# Patient Record
Sex: Female | Born: 1960 | Race: Black or African American | Hispanic: No | Marital: Single | Smoking: Current every day smoker
Health system: Southern US, Community
[De-identification: ages and names within clinical notes are randomized; demographics above are authoritative.]

## PROBLEM LIST (undated history)

## (undated) DIAGNOSIS — K219 Gastro-esophageal reflux disease without esophagitis: Secondary | ICD-10-CM

## (undated) DIAGNOSIS — F329 Major depressive disorder, single episode, unspecified: Secondary | ICD-10-CM

## (undated) DIAGNOSIS — M1990 Unspecified osteoarthritis, unspecified site: Secondary | ICD-10-CM

## (undated) DIAGNOSIS — J181 Lobar pneumonia, unspecified organism: Principal | ICD-10-CM

## (undated) DIAGNOSIS — R55 Syncope and collapse: Secondary | ICD-10-CM

## (undated) DIAGNOSIS — F32A Depression, unspecified: Secondary | ICD-10-CM

## (undated) DIAGNOSIS — F3181 Bipolar II disorder: Secondary | ICD-10-CM

## (undated) HISTORY — DX: Unspecified osteoarthritis, unspecified site: M19.90

## (undated) HISTORY — DX: Bipolar II disorder: F31.81

## (undated) HISTORY — DX: Lobar pneumonia, unspecified organism: J18.1

## (undated) HISTORY — PX: EYE SURGERY: SHX253

## (undated) HISTORY — PX: OTHER SURGICAL HISTORY: SHX169

## (undated) HISTORY — DX: Morbid (severe) obesity due to excess calories: E66.01

## (undated) HISTORY — PX: ROTATOR CUFF REPAIR: SHX139

## (undated) HISTORY — DX: Depression, unspecified: F32.A

## (undated) HISTORY — DX: Major depressive disorder, single episode, unspecified: F32.9

---

## 1997-11-25 ENCOUNTER — Ambulatory Visit (HOSPITAL_COMMUNITY): Admission: RE | Admit: 1997-11-25 | Discharge: 1997-11-25 | Payer: Self-pay | Admitting: Obstetrics

## 1997-12-09 ENCOUNTER — Ambulatory Visit (HOSPITAL_COMMUNITY): Admission: RE | Admit: 1997-12-09 | Discharge: 1997-12-09 | Payer: Self-pay | Admitting: Obstetrics

## 1998-01-20 ENCOUNTER — Other Ambulatory Visit: Admission: RE | Admit: 1998-01-20 | Discharge: 1998-01-20 | Payer: Self-pay | Admitting: Obstetrics

## 1998-02-19 ENCOUNTER — Inpatient Hospital Stay (HOSPITAL_COMMUNITY): Admission: AD | Admit: 1998-02-19 | Discharge: 1998-02-19 | Payer: Self-pay | Admitting: Obstetrics

## 1998-02-19 ENCOUNTER — Inpatient Hospital Stay (HOSPITAL_COMMUNITY): Admission: AD | Admit: 1998-02-19 | Discharge: 1998-02-22 | Payer: Self-pay | Admitting: Obstetrics

## 1998-08-18 ENCOUNTER — Emergency Department (HOSPITAL_COMMUNITY): Admission: EM | Admit: 1998-08-18 | Discharge: 1998-08-18 | Payer: Self-pay | Admitting: Emergency Medicine

## 1998-08-19 ENCOUNTER — Encounter: Payer: Self-pay | Admitting: Infectious Diseases

## 1998-08-19 ENCOUNTER — Ambulatory Visit (HOSPITAL_COMMUNITY): Admission: RE | Admit: 1998-08-19 | Discharge: 1998-08-19 | Payer: Self-pay | Admitting: *Deleted

## 1998-08-21 ENCOUNTER — Emergency Department (HOSPITAL_COMMUNITY): Admission: EM | Admit: 1998-08-21 | Discharge: 1998-08-21 | Payer: Self-pay | Admitting: Emergency Medicine

## 2000-02-08 ENCOUNTER — Encounter: Admission: RE | Admit: 2000-02-08 | Discharge: 2000-05-08 | Payer: Self-pay | Admitting: *Deleted

## 2000-02-09 ENCOUNTER — Emergency Department (HOSPITAL_COMMUNITY): Admission: EM | Admit: 2000-02-09 | Discharge: 2000-02-09 | Payer: Self-pay | Admitting: Emergency Medicine

## 2000-02-11 ENCOUNTER — Encounter: Admission: RE | Admit: 2000-02-11 | Discharge: 2000-02-11 | Payer: Self-pay | Admitting: Occupational Medicine

## 2000-02-11 ENCOUNTER — Encounter: Payer: Self-pay | Admitting: Occupational Medicine

## 2000-03-24 ENCOUNTER — Encounter: Payer: Self-pay | Admitting: Orthopedic Surgery

## 2000-03-24 ENCOUNTER — Encounter: Admission: RE | Admit: 2000-03-24 | Discharge: 2000-03-24 | Payer: Self-pay | Admitting: Orthopedic Surgery

## 2000-08-04 ENCOUNTER — Encounter: Admission: RE | Admit: 2000-08-04 | Discharge: 2000-08-04 | Payer: Self-pay | Admitting: Orthopedic Surgery

## 2000-08-04 ENCOUNTER — Encounter: Payer: Self-pay | Admitting: Orthopedic Surgery

## 2000-09-30 ENCOUNTER — Emergency Department (HOSPITAL_COMMUNITY): Admission: EM | Admit: 2000-09-30 | Discharge: 2000-09-30 | Payer: Self-pay | Admitting: *Deleted

## 2000-10-12 ENCOUNTER — Encounter: Admission: RE | Admit: 2000-10-12 | Discharge: 2000-10-12 | Payer: Self-pay | Admitting: Obstetrics

## 2000-10-16 ENCOUNTER — Ambulatory Visit (HOSPITAL_COMMUNITY): Admission: RE | Admit: 2000-10-16 | Discharge: 2000-10-16 | Payer: Self-pay | Admitting: Obstetrics

## 2000-10-31 ENCOUNTER — Emergency Department (HOSPITAL_COMMUNITY): Admission: EM | Admit: 2000-10-31 | Discharge: 2000-10-31 | Payer: Self-pay | Admitting: Emergency Medicine

## 2000-11-02 ENCOUNTER — Encounter: Admission: RE | Admit: 2000-11-02 | Discharge: 2000-11-02 | Payer: Self-pay | Admitting: Obstetrics

## 2001-02-02 ENCOUNTER — Emergency Department (HOSPITAL_COMMUNITY): Admission: EM | Admit: 2001-02-02 | Discharge: 2001-02-02 | Payer: Self-pay | Admitting: Emergency Medicine

## 2001-02-15 ENCOUNTER — Encounter: Admission: RE | Admit: 2001-02-15 | Discharge: 2001-02-15 | Payer: Self-pay | Admitting: Obstetrics

## 2001-02-27 ENCOUNTER — Emergency Department (HOSPITAL_COMMUNITY): Admission: EM | Admit: 2001-02-27 | Discharge: 2001-02-27 | Payer: Self-pay | Admitting: Emergency Medicine

## 2001-03-02 ENCOUNTER — Encounter: Admission: RE | Admit: 2001-03-02 | Discharge: 2001-03-02 | Payer: Self-pay | Admitting: Family Medicine

## 2001-03-08 ENCOUNTER — Encounter: Admission: RE | Admit: 2001-03-08 | Discharge: 2001-05-07 | Payer: Self-pay | Admitting: Sports Medicine

## 2001-06-28 ENCOUNTER — Encounter: Admission: RE | Admit: 2001-06-28 | Discharge: 2001-09-26 | Payer: Self-pay | Admitting: Orthopedic Surgery

## 2001-09-04 ENCOUNTER — Encounter: Admission: RE | Admit: 2001-09-04 | Discharge: 2001-09-04 | Payer: Self-pay | Admitting: Obstetrics & Gynecology

## 2001-10-28 ENCOUNTER — Emergency Department (HOSPITAL_COMMUNITY): Admission: EM | Admit: 2001-10-28 | Discharge: 2001-10-28 | Payer: Self-pay | Admitting: Emergency Medicine

## 2002-03-13 ENCOUNTER — Emergency Department (HOSPITAL_COMMUNITY): Admission: EM | Admit: 2002-03-13 | Discharge: 2002-03-14 | Payer: Self-pay | Admitting: *Deleted

## 2002-03-20 ENCOUNTER — Encounter: Admission: RE | Admit: 2002-03-20 | Discharge: 2002-03-20 | Payer: Self-pay | Admitting: Sports Medicine

## 2002-03-20 ENCOUNTER — Encounter: Payer: Self-pay | Admitting: Sports Medicine

## 2002-04-01 ENCOUNTER — Encounter: Admission: RE | Admit: 2002-04-01 | Discharge: 2002-04-01 | Payer: Self-pay | Admitting: Family Medicine

## 2002-04-29 ENCOUNTER — Encounter: Admission: RE | Admit: 2002-04-29 | Discharge: 2002-04-29 | Payer: Self-pay | Admitting: Family Medicine

## 2002-05-27 ENCOUNTER — Encounter: Admission: RE | Admit: 2002-05-27 | Discharge: 2002-05-27 | Payer: Self-pay | Admitting: Family Medicine

## 2002-06-06 ENCOUNTER — Encounter: Admission: RE | Admit: 2002-06-06 | Discharge: 2002-06-06 | Payer: Self-pay | Admitting: Sports Medicine

## 2002-06-06 ENCOUNTER — Encounter: Payer: Self-pay | Admitting: Sports Medicine

## 2002-06-11 ENCOUNTER — Encounter: Admission: RE | Admit: 2002-06-11 | Discharge: 2002-06-11 | Payer: Self-pay | Admitting: Family Medicine

## 2002-07-10 ENCOUNTER — Encounter: Admission: RE | Admit: 2002-07-10 | Discharge: 2002-07-10 | Payer: Self-pay | Admitting: Family Medicine

## 2002-08-08 ENCOUNTER — Encounter: Admission: RE | Admit: 2002-08-08 | Discharge: 2002-08-08 | Payer: Self-pay | Admitting: Family Medicine

## 2002-10-15 ENCOUNTER — Encounter: Admission: RE | Admit: 2002-10-15 | Discharge: 2002-11-01 | Payer: Self-pay | Admitting: Family Medicine

## 2002-10-31 ENCOUNTER — Encounter: Admission: RE | Admit: 2002-10-31 | Discharge: 2002-10-31 | Payer: Self-pay | Admitting: Family Medicine

## 2002-11-04 ENCOUNTER — Encounter: Admission: RE | Admit: 2002-11-04 | Discharge: 2002-11-04 | Payer: Self-pay | Admitting: Family Medicine

## 2002-11-07 ENCOUNTER — Encounter: Admission: RE | Admit: 2002-11-07 | Discharge: 2002-11-07 | Payer: Self-pay | Admitting: Family Medicine

## 2002-12-31 ENCOUNTER — Ambulatory Visit (HOSPITAL_BASED_OUTPATIENT_CLINIC_OR_DEPARTMENT_OTHER): Admission: RE | Admit: 2002-12-31 | Discharge: 2002-12-31 | Payer: Self-pay | Admitting: Orthopaedic Surgery

## 2003-01-28 ENCOUNTER — Encounter: Admission: RE | Admit: 2003-01-28 | Discharge: 2003-04-15 | Payer: Self-pay | Admitting: Orthopaedic Surgery

## 2004-03-15 ENCOUNTER — Emergency Department (HOSPITAL_COMMUNITY): Admission: EM | Admit: 2004-03-15 | Discharge: 2004-03-15 | Payer: Self-pay | Admitting: Emergency Medicine

## 2004-07-31 ENCOUNTER — Emergency Department (HOSPITAL_COMMUNITY): Admission: EM | Admit: 2004-07-31 | Discharge: 2004-07-31 | Payer: Self-pay | Admitting: Family Medicine

## 2004-08-18 ENCOUNTER — Ambulatory Visit: Payer: Self-pay | Admitting: Family Medicine

## 2004-09-10 ENCOUNTER — Ambulatory Visit: Payer: Self-pay | Admitting: Family Medicine

## 2004-10-03 ENCOUNTER — Encounter (INDEPENDENT_AMBULATORY_CARE_PROVIDER_SITE_OTHER): Payer: Self-pay | Admitting: *Deleted

## 2004-10-03 LAB — CONVERTED CEMR LAB

## 2004-10-19 ENCOUNTER — Ambulatory Visit: Payer: Self-pay | Admitting: Family Medicine

## 2004-11-17 ENCOUNTER — Ambulatory Visit: Payer: Self-pay | Admitting: Sports Medicine

## 2004-12-14 ENCOUNTER — Ambulatory Visit: Payer: Self-pay | Admitting: Family Medicine

## 2004-12-22 ENCOUNTER — Encounter
Admission: RE | Admit: 2004-12-22 | Discharge: 2005-03-22 | Payer: Self-pay | Admitting: Physical Medicine and Rehabilitation

## 2004-12-24 ENCOUNTER — Ambulatory Visit: Payer: Self-pay | Admitting: Physical Medicine and Rehabilitation

## 2004-12-28 ENCOUNTER — Ambulatory Visit: Payer: Self-pay | Admitting: Family Medicine

## 2005-01-13 ENCOUNTER — Ambulatory Visit: Payer: Self-pay | Admitting: Family Medicine

## 2005-02-24 ENCOUNTER — Encounter: Admission: RE | Admit: 2005-02-24 | Discharge: 2005-02-24 | Payer: Self-pay | Admitting: Sports Medicine

## 2005-04-01 ENCOUNTER — Encounter
Admission: RE | Admit: 2005-04-01 | Discharge: 2005-06-30 | Payer: Self-pay | Admitting: Physical Medicine and Rehabilitation

## 2005-04-13 ENCOUNTER — Ambulatory Visit: Payer: Self-pay | Admitting: Physical Medicine and Rehabilitation

## 2005-04-25 ENCOUNTER — Ambulatory Visit: Payer: Self-pay | Admitting: Psychology

## 2005-04-25 ENCOUNTER — Encounter
Admission: RE | Admit: 2005-04-25 | Discharge: 2005-06-16 | Payer: Self-pay | Admitting: Physical Medicine and Rehabilitation

## 2005-04-29 ENCOUNTER — Ambulatory Visit: Payer: Self-pay | Admitting: Family Medicine

## 2005-05-02 ENCOUNTER — Emergency Department (HOSPITAL_COMMUNITY): Admission: EM | Admit: 2005-05-02 | Discharge: 2005-05-02 | Payer: Self-pay | Admitting: Family Medicine

## 2005-05-12 ENCOUNTER — Emergency Department (HOSPITAL_COMMUNITY): Admission: EM | Admit: 2005-05-12 | Discharge: 2005-05-12 | Payer: Self-pay | Admitting: Emergency Medicine

## 2005-05-17 ENCOUNTER — Ambulatory Visit: Payer: Self-pay | Admitting: Physical Medicine and Rehabilitation

## 2005-05-19 ENCOUNTER — Ambulatory Visit (HOSPITAL_COMMUNITY)
Admission: RE | Admit: 2005-05-19 | Discharge: 2005-05-19 | Payer: Self-pay | Admitting: Physical Medicine and Rehabilitation

## 2005-06-01 ENCOUNTER — Encounter: Admission: RE | Admit: 2005-06-01 | Discharge: 2005-06-01 | Payer: Self-pay | Admitting: Sports Medicine

## 2005-06-14 ENCOUNTER — Ambulatory Visit: Payer: Self-pay | Admitting: Psychology

## 2005-06-17 ENCOUNTER — Ambulatory Visit: Payer: Self-pay | Admitting: Physical Medicine and Rehabilitation

## 2005-07-15 ENCOUNTER — Encounter
Admission: RE | Admit: 2005-07-15 | Discharge: 2005-10-13 | Payer: Self-pay | Admitting: Physical Medicine and Rehabilitation

## 2005-08-12 ENCOUNTER — Ambulatory Visit: Payer: Self-pay | Admitting: Family Medicine

## 2005-08-16 ENCOUNTER — Ambulatory Visit: Payer: Self-pay | Admitting: Physical Medicine and Rehabilitation

## 2005-09-01 ENCOUNTER — Encounter
Admission: RE | Admit: 2005-09-01 | Discharge: 2005-09-01 | Payer: Self-pay | Admitting: Physical Medicine and Rehabilitation

## 2005-09-26 ENCOUNTER — Emergency Department (HOSPITAL_COMMUNITY): Admission: EM | Admit: 2005-09-26 | Discharge: 2005-09-26 | Payer: Self-pay | Admitting: Emergency Medicine

## 2005-10-21 ENCOUNTER — Ambulatory Visit: Payer: Self-pay | Admitting: Physical Medicine and Rehabilitation

## 2005-10-21 ENCOUNTER — Encounter
Admission: RE | Admit: 2005-10-21 | Discharge: 2006-01-19 | Payer: Self-pay | Admitting: Physical Medicine and Rehabilitation

## 2005-11-14 ENCOUNTER — Ambulatory Visit: Payer: Self-pay | Admitting: Family Medicine

## 2005-12-05 ENCOUNTER — Ambulatory Visit: Payer: Self-pay | Admitting: Family Medicine

## 2005-12-05 ENCOUNTER — Ambulatory Visit: Payer: Self-pay | Admitting: Physical Medicine and Rehabilitation

## 2005-12-08 ENCOUNTER — Ambulatory Visit (HOSPITAL_COMMUNITY)
Admission: RE | Admit: 2005-12-08 | Discharge: 2005-12-08 | Payer: Self-pay | Admitting: Physical Medicine and Rehabilitation

## 2005-12-21 ENCOUNTER — Ambulatory Visit: Payer: Self-pay | Admitting: Family Medicine

## 2005-12-21 ENCOUNTER — Encounter
Admission: RE | Admit: 2005-12-21 | Discharge: 2005-12-21 | Payer: Self-pay | Admitting: Physical Medicine and Rehabilitation

## 2006-01-17 ENCOUNTER — Ambulatory Visit: Payer: Self-pay | Admitting: Physical Medicine and Rehabilitation

## 2006-01-17 ENCOUNTER — Encounter
Admission: RE | Admit: 2006-01-17 | Discharge: 2006-04-17 | Payer: Self-pay | Admitting: Physical Medicine and Rehabilitation

## 2006-01-30 ENCOUNTER — Encounter: Admission: RE | Admit: 2006-01-30 | Discharge: 2006-01-30 | Payer: Self-pay | Admitting: Sports Medicine

## 2006-02-20 ENCOUNTER — Encounter: Admission: RE | Admit: 2006-02-20 | Discharge: 2006-02-20 | Payer: Self-pay | Admitting: Sports Medicine

## 2006-03-13 ENCOUNTER — Ambulatory Visit: Payer: Self-pay | Admitting: Physical Medicine and Rehabilitation

## 2006-05-05 ENCOUNTER — Encounter
Admission: RE | Admit: 2006-05-05 | Discharge: 2006-08-03 | Payer: Self-pay | Admitting: Physical Medicine and Rehabilitation

## 2006-05-05 ENCOUNTER — Ambulatory Visit: Payer: Self-pay | Admitting: Physical Medicine and Rehabilitation

## 2006-06-06 ENCOUNTER — Ambulatory Visit: Payer: Self-pay | Admitting: Physical Medicine and Rehabilitation

## 2006-07-04 ENCOUNTER — Ambulatory Visit: Payer: Self-pay | Admitting: Physical Medicine and Rehabilitation

## 2006-07-08 ENCOUNTER — Encounter
Admission: RE | Admit: 2006-07-08 | Discharge: 2006-07-08 | Payer: Self-pay | Admitting: Physical Medicine and Rehabilitation

## 2006-08-29 ENCOUNTER — Ambulatory Visit: Payer: Self-pay | Admitting: Physical Medicine and Rehabilitation

## 2006-08-29 ENCOUNTER — Encounter
Admission: RE | Admit: 2006-08-29 | Discharge: 2006-11-27 | Payer: Self-pay | Admitting: Physical Medicine and Rehabilitation

## 2006-10-18 ENCOUNTER — Ambulatory Visit: Payer: Self-pay | Admitting: Physical Medicine and Rehabilitation

## 2006-10-18 ENCOUNTER — Encounter
Admission: RE | Admit: 2006-10-18 | Discharge: 2007-01-16 | Payer: Self-pay | Admitting: Physical Medicine and Rehabilitation

## 2006-10-31 ENCOUNTER — Encounter
Admission: RE | Admit: 2006-10-31 | Discharge: 2006-12-14 | Payer: Self-pay | Admitting: Physical Medicine and Rehabilitation

## 2006-11-30 DIAGNOSIS — N3941 Urge incontinence: Secondary | ICD-10-CM | POA: Insufficient documentation

## 2006-11-30 DIAGNOSIS — F17200 Nicotine dependence, unspecified, uncomplicated: Secondary | ICD-10-CM

## 2006-11-30 DIAGNOSIS — M479 Spondylosis, unspecified: Secondary | ICD-10-CM | POA: Insufficient documentation

## 2006-11-30 DIAGNOSIS — E669 Obesity, unspecified: Secondary | ICD-10-CM | POA: Insufficient documentation

## 2006-11-30 HISTORY — DX: Urge incontinence: N39.41

## 2006-12-01 ENCOUNTER — Encounter (INDEPENDENT_AMBULATORY_CARE_PROVIDER_SITE_OTHER): Payer: Self-pay | Admitting: *Deleted

## 2006-12-12 ENCOUNTER — Ambulatory Visit: Payer: Self-pay | Admitting: Physical Medicine and Rehabilitation

## 2006-12-21 ENCOUNTER — Encounter
Admission: RE | Admit: 2006-12-21 | Discharge: 2007-03-21 | Payer: Self-pay | Admitting: Physical Medicine & Rehabilitation

## 2006-12-26 ENCOUNTER — Ambulatory Visit: Payer: Self-pay | Admitting: Physical Medicine & Rehabilitation

## 2007-01-03 ENCOUNTER — Ambulatory Visit: Payer: Self-pay | Admitting: Physical Medicine and Rehabilitation

## 2007-02-02 ENCOUNTER — Ambulatory Visit: Payer: Self-pay | Admitting: Physical Medicine and Rehabilitation

## 2007-02-06 ENCOUNTER — Encounter
Admission: RE | Admit: 2007-02-06 | Discharge: 2007-02-06 | Payer: Self-pay | Admitting: Physical Medicine and Rehabilitation

## 2007-02-14 ENCOUNTER — Encounter
Admission: RE | Admit: 2007-02-14 | Discharge: 2007-03-27 | Payer: Self-pay | Admitting: Physical Medicine and Rehabilitation

## 2007-03-26 ENCOUNTER — Ambulatory Visit: Payer: Self-pay | Admitting: Family Medicine

## 2007-03-30 ENCOUNTER — Encounter
Admission: RE | Admit: 2007-03-30 | Discharge: 2007-06-28 | Payer: Self-pay | Admitting: Physical Medicine and Rehabilitation

## 2007-04-04 ENCOUNTER — Ambulatory Visit: Payer: Self-pay | Admitting: Physical Medicine and Rehabilitation

## 2007-04-26 ENCOUNTER — Telehealth: Payer: Self-pay | Admitting: *Deleted

## 2007-05-03 ENCOUNTER — Ambulatory Visit: Payer: Self-pay | Admitting: Family Medicine

## 2007-05-03 ENCOUNTER — Encounter: Payer: Self-pay | Admitting: Family Medicine

## 2007-05-03 LAB — CONVERTED CEMR LAB
BUN: 11 mg/dL (ref 6–23)
CO2: 24 meq/L (ref 19–32)
Calcium: 9.2 mg/dL (ref 8.4–10.5)
Chloride: 110 meq/L (ref 96–112)
Creatinine, Ser: 0.69 mg/dL (ref 0.40–1.20)
Glucose, Bld: 88 mg/dL (ref 70–99)
Potassium: 3.9 meq/L (ref 3.5–5.3)
Sodium: 143 meq/L (ref 135–145)

## 2007-05-15 ENCOUNTER — Ambulatory Visit: Payer: Self-pay | Admitting: Family Medicine

## 2007-05-23 ENCOUNTER — Encounter: Payer: Self-pay | Admitting: *Deleted

## 2007-06-01 ENCOUNTER — Ambulatory Visit: Payer: Self-pay | Admitting: Physical Medicine and Rehabilitation

## 2007-06-05 ENCOUNTER — Ambulatory Visit: Payer: Self-pay | Admitting: Physical Medicine and Rehabilitation

## 2007-06-11 ENCOUNTER — Telehealth: Payer: Self-pay | Admitting: *Deleted

## 2007-06-29 ENCOUNTER — Encounter
Admission: RE | Admit: 2007-06-29 | Discharge: 2007-09-27 | Payer: Self-pay | Admitting: Physical Medicine and Rehabilitation

## 2007-07-02 ENCOUNTER — Encounter
Admission: RE | Admit: 2007-07-02 | Discharge: 2007-07-02 | Payer: Self-pay | Admitting: Physical Medicine and Rehabilitation

## 2007-07-13 ENCOUNTER — Ambulatory Visit: Payer: Self-pay | Admitting: Physical Medicine and Rehabilitation

## 2007-08-10 ENCOUNTER — Ambulatory Visit: Payer: Self-pay | Admitting: Physical Medicine and Rehabilitation

## 2007-10-05 ENCOUNTER — Ambulatory Visit: Payer: Self-pay | Admitting: Physical Medicine and Rehabilitation

## 2007-10-05 ENCOUNTER — Encounter
Admission: RE | Admit: 2007-10-05 | Discharge: 2008-01-03 | Payer: Self-pay | Admitting: Physical Medicine and Rehabilitation

## 2007-11-29 ENCOUNTER — Ambulatory Visit: Payer: Self-pay | Admitting: Physical Medicine and Rehabilitation

## 2007-12-26 ENCOUNTER — Encounter
Admission: RE | Admit: 2007-12-26 | Discharge: 2008-03-25 | Payer: Self-pay | Admitting: Physical Medicine and Rehabilitation

## 2008-01-28 ENCOUNTER — Ambulatory Visit: Payer: Self-pay | Admitting: Physical Medicine and Rehabilitation

## 2008-02-13 ENCOUNTER — Ambulatory Visit: Payer: Self-pay | Admitting: Physical Medicine and Rehabilitation

## 2008-02-18 ENCOUNTER — Encounter
Admission: RE | Admit: 2008-02-18 | Discharge: 2008-02-18 | Payer: Self-pay | Admitting: Physical Medicine and Rehabilitation

## 2008-02-22 ENCOUNTER — Ambulatory Visit: Payer: Self-pay | Admitting: Physical Medicine and Rehabilitation

## 2008-03-06 ENCOUNTER — Encounter: Admission: RE | Admit: 2008-03-06 | Discharge: 2008-03-06 | Payer: Self-pay | Admitting: Sports Medicine

## 2008-03-18 ENCOUNTER — Encounter
Admission: RE | Admit: 2008-03-18 | Discharge: 2008-03-21 | Payer: Self-pay | Admitting: Physical Medicine and Rehabilitation

## 2008-03-21 ENCOUNTER — Ambulatory Visit: Payer: Self-pay | Admitting: Physical Medicine and Rehabilitation

## 2008-04-13 ENCOUNTER — Encounter: Admission: RE | Admit: 2008-04-13 | Discharge: 2008-04-13 | Payer: Self-pay | Admitting: Sports Medicine

## 2008-04-25 ENCOUNTER — Ambulatory Visit: Payer: Self-pay | Admitting: Family Medicine

## 2008-05-07 ENCOUNTER — Ambulatory Visit (HOSPITAL_COMMUNITY): Admission: RE | Admit: 2008-05-07 | Discharge: 2008-05-07 | Payer: Self-pay | Admitting: Family Medicine

## 2008-10-26 ENCOUNTER — Emergency Department (HOSPITAL_COMMUNITY): Admission: EM | Admit: 2008-10-26 | Discharge: 2008-10-26 | Payer: Self-pay | Admitting: Family Medicine

## 2008-12-26 ENCOUNTER — Telehealth (INDEPENDENT_AMBULATORY_CARE_PROVIDER_SITE_OTHER): Payer: Self-pay | Admitting: *Deleted

## 2008-12-27 ENCOUNTER — Emergency Department (HOSPITAL_COMMUNITY): Admission: EM | Admit: 2008-12-27 | Discharge: 2008-12-27 | Payer: Self-pay | Admitting: Family Medicine

## 2009-01-02 ENCOUNTER — Ambulatory Visit: Payer: Self-pay | Admitting: Family Medicine

## 2009-02-06 ENCOUNTER — Encounter
Admission: RE | Admit: 2009-02-06 | Discharge: 2009-02-09 | Payer: Self-pay | Admitting: Physical Medicine and Rehabilitation

## 2009-02-09 ENCOUNTER — Ambulatory Visit: Payer: Self-pay | Admitting: Physical Medicine and Rehabilitation

## 2009-02-17 ENCOUNTER — Ambulatory Visit: Payer: Self-pay | Admitting: Family Medicine

## 2009-02-19 ENCOUNTER — Encounter
Admission: RE | Admit: 2009-02-19 | Discharge: 2009-03-03 | Payer: Self-pay | Admitting: Physical Medicine and Rehabilitation

## 2009-02-20 ENCOUNTER — Other Ambulatory Visit: Admission: RE | Admit: 2009-02-20 | Discharge: 2009-02-20 | Payer: Self-pay | Admitting: Family Medicine

## 2009-02-20 ENCOUNTER — Ambulatory Visit: Payer: Self-pay | Admitting: Family Medicine

## 2009-02-20 ENCOUNTER — Encounter: Payer: Self-pay | Admitting: Family Medicine

## 2009-02-24 ENCOUNTER — Encounter: Payer: Self-pay | Admitting: Family Medicine

## 2009-03-15 ENCOUNTER — Encounter: Admission: RE | Admit: 2009-03-15 | Discharge: 2009-03-15 | Payer: Self-pay | Admitting: Sports Medicine

## 2009-06-03 ENCOUNTER — Encounter: Payer: Self-pay | Admitting: Family Medicine

## 2009-06-03 ENCOUNTER — Ambulatory Visit: Payer: Self-pay | Admitting: Family Medicine

## 2009-06-03 ENCOUNTER — Encounter (INDEPENDENT_AMBULATORY_CARE_PROVIDER_SITE_OTHER): Payer: Self-pay | Admitting: *Deleted

## 2009-06-03 DIAGNOSIS — M549 Dorsalgia, unspecified: Secondary | ICD-10-CM | POA: Insufficient documentation

## 2009-06-09 ENCOUNTER — Telehealth: Payer: Self-pay | Admitting: Family Medicine

## 2009-08-03 ENCOUNTER — Ambulatory Visit: Payer: Self-pay | Admitting: Family Medicine

## 2009-08-31 ENCOUNTER — Telehealth: Payer: Self-pay | Admitting: Family Medicine

## 2009-09-01 ENCOUNTER — Ambulatory Visit: Payer: Self-pay | Admitting: Family Medicine

## 2009-09-18 ENCOUNTER — Ambulatory Visit: Payer: Self-pay | Admitting: Family Medicine

## 2010-01-19 ENCOUNTER — Ambulatory Visit: Payer: Self-pay | Admitting: Family Medicine

## 2010-01-19 DIAGNOSIS — M25559 Pain in unspecified hip: Secondary | ICD-10-CM

## 2010-02-02 ENCOUNTER — Ambulatory Visit: Payer: Self-pay | Admitting: Family Medicine

## 2010-02-02 ENCOUNTER — Encounter: Payer: Self-pay | Admitting: Family Medicine

## 2010-02-02 DIAGNOSIS — G479 Sleep disorder, unspecified: Secondary | ICD-10-CM | POA: Insufficient documentation

## 2010-02-03 ENCOUNTER — Encounter: Admission: RE | Admit: 2010-02-03 | Discharge: 2010-02-03 | Payer: Self-pay | Admitting: Sports Medicine

## 2010-06-30 ENCOUNTER — Encounter: Payer: Self-pay | Admitting: Family Medicine

## 2010-06-30 ENCOUNTER — Ambulatory Visit: Payer: Self-pay | Admitting: Family Medicine

## 2010-08-16 ENCOUNTER — Encounter: Payer: Self-pay | Admitting: Family Medicine

## 2010-08-16 ENCOUNTER — Ambulatory Visit: Payer: Self-pay | Admitting: Family Medicine

## 2010-08-16 LAB — CONVERTED CEMR LAB
ALT: 22 units/L (ref 0–35)
Alkaline Phosphatase: 65 units/L (ref 39–117)
CO2: 26 meq/L (ref 19–32)
Creatinine, Ser: 0.78 mg/dL (ref 0.40–1.20)
Glucose, Bld: 95 mg/dL (ref 70–99)
HCT: 38.8 % (ref 36.0–46.0)
MCHC: 32.5 g/dL (ref 30.0–36.0)
MCV: 86.2 fL (ref 78.0–100.0)
RBC: 4.5 M/uL (ref 3.87–5.11)
Total Bilirubin: 0.2 mg/dL — ABNORMAL LOW (ref 0.3–1.2)
WBC: 10.1 10*3/uL (ref 4.0–10.5)

## 2010-08-18 ENCOUNTER — Encounter: Payer: Self-pay | Admitting: Family Medicine

## 2010-08-19 ENCOUNTER — Encounter: Payer: Self-pay | Admitting: Family Medicine

## 2010-09-06 ENCOUNTER — Ambulatory Visit: Payer: Self-pay | Admitting: Family Medicine

## 2010-10-24 ENCOUNTER — Encounter: Payer: Self-pay | Admitting: Physical Medicine and Rehabilitation

## 2010-10-24 ENCOUNTER — Encounter: Payer: Self-pay | Admitting: Sports Medicine

## 2010-10-25 ENCOUNTER — Encounter: Payer: Self-pay | Admitting: Sports Medicine

## 2010-11-04 NOTE — Assessment & Plan Note (Signed)
Summary: depression, pain,tcb   Vital Signs:  Patient profile:   50 year old female Weight:      206.5 pounds Temp:     99 degrees F oral Pulse rate:   80 / minute Pulse rhythm:   regular BP sitting:   124 / 83  (right arm) Cuff size:   large  Vitals Entered By: Loralee Pacas CMA (June 30, 2010 3:48 PM) CC: follow-up visit   Primary Care Omie Ferger:  Delbert Harness MD  CC:  follow-up visit.  History of Present Illness: 50 yo here to discuss:  1.  Learning disability: Consulting civil engineer at [employer]  Would like me to write letter to "get me services i need"  She would like extra test taking time, get qualified to have a note-taker in class for her,  have tutoring.  Has not had a diagnosis of learning disorder.  Called [employer] for eval but says she cannot afford to go.  2.  chronic [facility]:  [facility].  She requests a rheumatology consult again because she feels they are only treating pain and not the source.  She has back pain and leg pain when very active and stangin.  No swollen joints or other arthropathy.  3. depression: crys frequently.  No Si, HI.  Denies manic symptoms.  Has been treated before but does not know with what.  Never hospitalized.  Habits & Providers  Alcohol-Tobacco-Diet     Tobacco Status: current     Tobacco Counseling: to quit use of tobacco products     Cigarette Packs/Day: 0.25     Year Quit: June 10th 2008  Current Medications (verified): 1)  Hydrocodone-Acetaminophen 10-325 Mg Tabs (Hydrocodone-Acetaminophen) 2)  Cyclobenzaprine Hcl 10 Mg Tabs (Cyclobenzaprine Hcl) 3)  Hydroxyzine Hcl 50 Mg Tabs (Hydroxyzine Hcl) .... One Half Tab To One Full Tab By Mouth 30 Minutes Before Bedtime As Needed For Insomnia 4)  Diclofenac Sodium 75 Mg Tbec (Diclofenac Sodium) .... One Tablet Twice A Day 5)  Effexor Xr 75 Mg Xr24h-Cap (Venlafaxine Hcl) .... Take One Tablet Daily For 7 Days, Then Increase To Two Tablets Daily  Allergies: No Known Drug Allergies PMH-FH-SH  reviewed for relevance  Review of Systems      See HPI  Physical Exam  General:  Sad appearing.  vital reveiwed.   Impression & Recommendations:  Problem # 1:  DEPRESSIVE DISORDER, NOS (ICD-311)  Will start  treatment with effexor for what appears to be unipolar depression as well as an adjunct to her pain.  She identifies her chronic pain and its limitations on her lifestyle as a major source of stress in addition to finances.  Will follow-up in 3 weeks.    Her updated medication list for this problem includes:    Hydroxyzine Hcl 50 Mg Tabs (Hydroxyzine hcl) ..... One half tab to one full tab by mouth 30 minutes before bedtime as needed for insomnia    Effexor Xr 75 Mg Xr24h-cap (Venlafaxine hcl) .Marland Kitchen... Take one tablet daily for 7 days, then increase to two tablets daily  Orders: [facility]- Est  Level 4 ([address]132)  Problem # 2:  ? of POOR CONCENTRATION (ICD-310.1)  Depression as contributing factor.  Discussed with patient that I cannot evaluate for adult onset learning disability.  She states she is not able to afford evaluation.  After discussion, it seems to me evaluation is driven by worries on how to provide for her family financially and less by her concern for concentration.  Advised to  start with treatmentfor depression and then will revisit this issue.  Orders: [facility]- Est  Level 4 ([address])  Problem # 3:  BACK PAIN (ICD-724.5) chronic hip and back pain.  She is currenlty under the care of a pain clinic and an orthopedist whom she tells me she is contemplating hip surgery.  Will add effexor.  She is careful and makes sure I do not prescribe her a narcotic due to her contract with the pain clinic.  Her updated medication list for this problem includes:    Hydrocodone-acetaminophen 10-325 Mg Tabs (Hydrocodone-acetaminophen)    Cyclobenzaprine Hcl 10 Mg Tabs (Cyclobenzaprine hcl)    Diclofenac Sodium 75 Mg Tbec (Diclofenac sodium) ..... One tablet twice a day  Complete Medication  List: 1)  Hydrocodone-acetaminophen 10-325 Mg Tabs (Hydrocodone-acetaminophen) 2)  Cyclobenzaprine Hcl 10 Mg Tabs (Cyclobenzaprine hcl) 3)  Hydroxyzine Hcl 50 Mg Tabs (Hydroxyzine hcl) .... One half tab to one full tab by mouth 30 minutes before bedtime as needed for insomnia 4)  Diclofenac Sodium 75 Mg Tbec (Diclofenac sodium) .... One tablet twice a day 5)  Effexor Xr 75 Mg Xr24h-cap (Venlafaxine hcl) .... Take one tablet daily for 7 days, then increase to two tablets daily  Patient Instructions: 1)  Medicine effexor works to treat both pain and depression. 2)  You may notice increased nervousness at the beginning but this goes away. 3)  Daily exercise such as walking can help your weight, chronic pain, and lift your mood. 4)  Please make follow-up in 3 weeks. Prescriptions: EFFEXOR XR 75 MG XR24H-CAP (VENLAFAXINE HCL) take one tablet daily for 7 days, then increase to two tablets daily  #30 x 1   Entered and Authorized by:   Delbert Harness MD   Signed by:   Delbert Harness MD on 06/30/2010   Method used:   [facility]        [facility] [address] [phone]* (retail)       [address]       [place], [place]  [address]       Ph: [phone] or [phone]       Fax: [phone]   RxID:   [id number]

## 2010-11-04 NOTE — Letter (Signed)
Summary: Generic Letter  [facility]  [address]   [place], [place] [address]   Phone: [phone]  Fax: [phone]    08/19/2010  TAMBERLYN MIDGLEY [address] [place], [place]  [address]  Dear Ms. [name],  This letter is to inform you that our referral to [facility] confirmed that a functional capacity assessment is nto covered under medicaid.  If you would like to discuss payment with them, please contact Rehab at [phone].   Sincerely,   Delbert Harness MD  Appended Document: Generic Letter letter mailed

## 2010-11-04 NOTE — Assessment & Plan Note (Signed)
Summary: F/U VISIT/[facility]   Vital Signs:  Patient profile:   50 year old female Height:      61.5 inches Weight:      218.9 pounds BMI:     40.84 Temp:     98.1 degrees F oral Pulse rate:   83 / minute BP sitting:   116 / 80  (left arm) Cuff size:   regular  Vitals Entered By: Jimmy Footman, CMA (September 06, 2010 3:11 PM) CC: follow up Is Patient Diabetic? No   Primary Care Provider:  Delbert Harness MD  CC:  follow up.  History of Present Illness: 50 yo here for follow-up  Depression:  did not tolerate effexor.  Felt irritable and somnolent.  She states she tried to take it for longer period but still "did not like the way it made me feel"  At the end of the visit, gives me form to fill out for disability due to chornic back pain.  Habits & Providers  Alcohol-Tobacco-Diet     Tobacco Status: current     Tobacco Counseling: to quit use of tobacco products     Cigarette Packs/Day: 0.5     Year Quit: June 10th 2008  Current Medications (verified): 1)  Hydrocodone-Acetaminophen 10-325 Mg Tabs (Hydrocodone-Acetaminophen) 2)  Cyclobenzaprine Hcl 10 Mg Tabs (Cyclobenzaprine Hcl) 3)  Hydroxyzine Hcl 50 Mg Tabs (Hydroxyzine Hcl) .... One Half Tab To One Full Tab By Mouth 30 Minutes Before Bedtime As Needed For Insomnia 4)  Diclofenac Sodium 75 Mg Tbec (Diclofenac Sodium) .... One Tablet Twice A Day 5)  Celexa 20 Mg Tabs (Citalopram Hydrobromide) .... Take One Tablet Daily For 1 Week, Then Increase To Two Tablets Daily  Allergies: No Known Drug Allergies PMH-FH-SH reviewed for relevance  Social History: Lives with 4 children ages two youngest children- 16 yo girl and 3 year old boy.  Currently working intermittantly doing housecleaning.  Father of younger children incarcerated.    Review of Systems      See HPI  Physical Exam  General:  Sad appearing.  vitals reveiwed.   Impression & Recommendations:  Problem # 1:  DEPRESSIVE DISORDER, NOS (ICD-311)  Patient self d/c'd  effexor.  Will change to Celexa to also target chronic pain.  Will titrate up slowly as tolerated.  At follow-up will consider adding trazodone for insomnia and further treatment of depression.  The following medications were removed from the medication list:    Hydroxyzine Hcl 50 Mg Tabs (Hydroxyzine hcl) ..... One half tab to one full tab by mouth 30 minutes before bedtime as needed for insomnia Her updated medication list for this problem includes:    Celexa 20 Mg Tabs (Citalopram hydrobromide) .Marland Kitchen... Take one tablet daily for 1 week, then increase to two tablets daily    The following medications were removed from the medication list:    Hydroxyzine Hcl 50 Mg Tabs (Hydroxyzine hcl) ..... One half tab to one full tab by mouth 30 minutes before bedtime as needed for insomnia Her updated medication list for this problem includes:    Celexa 20 Mg Tabs (Citalopram hydrobromide) .Marland Kitchen... Take one tablet daily for 1 week, then increase to two tablets daily  Orders: [facility]- Est Level  3 ([address])  Problem # 2:  BACK PAIN (ICD-724.5)  Advised patient to discuss paperwork for chronic back pain with orthopedist and pain clinic.  She is undergoing active treatment with them.  She appears to have done a nerve conduction study with her pain  management clinic and had US guided hip injections with ortho this year by history but I do not have records of this.  Her updated medication list for this problem includes:    Hydrocodone-acetaminophen 10-325 Mg Tabs (Hydrocodone-acetaminophen)    Cyclobenzaprine Hcl 10 Mg Tabs (Cyclobenzaprine hcl)    Diclofenac Sodium 75 Mg Tbec (Diclofenac sodium) ..... One tablet twice a day    Her updated medication list for this problem includes:    Hydrocodone-acetaminophen 10-325 Mg Tabs (Hydrocodone-acetaminophen)    Cyclobenzaprine Hcl 10 Mg Tabs (Cyclobenzaprine hcl)    Diclofenac Sodium 75 Mg Tbec (Diclofenac sodium) ..... One tablet twice a day  Orders: [facility]- Est Level   3 ([address])  Complete Medication List: 1)  Hydrocodone-acetaminophen 10-325 Mg Tabs (Hydrocodone-acetaminophen) 2)  Cyclobenzaprine Hcl 10 Mg Tabs (Cyclobenzaprine hcl) 3)  Diclofenac Sodium 75 Mg Tbec (Diclofenac sodium) .... One tablet twice a day 4)  Celexa 20 Mg Tabs (Citalopram hydrobromide) .... Take one tablet daily for 1 week, then increase to two tablets daily  Patient Instructions: 1)  New medicine- change effexor to celexa.   2)  Follow-up in 4 weeks Prescriptions: CELEXA 20 MG TABS (CITALOPRAM HYDROBROMIDE) take one tablet daily for 1 week, then increase to two tablets daily  #60 x 1   Entered and Authorized by:   Delbert Harness MD   Signed by:   Delbert Harness MD on 09/13/2010   Method used:   [facility]        [facility] [address] [phone]* (retail)       [address]       [place], [place]  [address]       Ph: [phone] or [phone]       Fax: [phone]   RxID:   [id number]    Orders Added: 1)  [facility]- Est Level  3 [03474]     Prevention & Chronic Care Immunizations   Influenza vaccine: given  (09/18/2009)   Influenza vaccine due: 09/18/2010    Tetanus booster: 09/18/2009: given TDAP   Tetanus booster due: 09/19/2019    Pneumococcal vaccine: Not documented  Other Screening   Pap smear: NEGATIVE FOR INTRAEPITHELIAL LESIONS OR MALIGNANCY.  (02/20/2009)   Pap smear due: 02/21/2011    Mammogram: Done.  (06/08/2005)   Mammogram due: Not Indicated   Smoking status: current  (09/06/2010)  Lipids   Total Cholesterol: Not documented   LDL: Not documented   LDL Direct: Not documented   HDL: Not documented   Triglycerides: Not documented

## 2010-11-04 NOTE — Assessment & Plan Note (Signed)
Summary: chronic R hip pain   Vital Signs:  Patient profile:   50 year old female Height:      61.5 inches Weight:      201.9 pounds BMI:     37.67 Temp:     97.1 degrees F oral Pulse rate:   90 / minute BP sitting:   104 / 73  (left arm) Cuff size:   regular  Vitals Entered By: Gladstone Pih (January 19, 2010 3:46 PM) CC: C/O pain in hip and back Is Patient Diabetic? No Pain Assessment Patient in pain? no        Primary Care Provider:  Delbert Harness MD  CC:  C/O pain in hip and back.  History of Present Illness: 50yo F here w/ complaints of uncontrolled R hip and back pain  R hip and back pain: Chronic condition.  States that her pain is persistent achy, stabbing, throbbing pain that is not improving.  States that it will radiate down the front of the thigh to the ankle.  Also reports pain in the groin area.  Currently taking vicodin and cyclbenzaprine prescribed by the [facility].  She was last seen 1 week ago.  She is also followed by Dr. Farris Has at [facility].  Habits & Providers  Alcohol-Tobacco-Diet     Tobacco Status: current     Tobacco Counseling: to quit use of tobacco products     Cigarette Packs/Day: 0.5  Current Medications (verified): 1)  Hydrocodone-Acetaminophen 10-325 Mg Tabs (Hydrocodone-Acetaminophen) 2)  Cyclobenzaprine Hcl 10 Mg Tabs (Cyclobenzaprine Hcl)  Allergies (verified): No Known Drug Allergies  Review of Systems       no fevers or chills  Physical Exam  General:  VS Reviewed. Obese, non ill appearing, appears mild uncomfortable, able to get on the exam table without assistance  Msk:  R hip exam Inspection- excessive adipose tissue, no obvious deformities, no ecchymosis, erythema, or edema Palpation- No ttp of trochanteric bursa ROM- Passive flexion intact; limited internal/external ROM due to pain Neg log roll  Neurologic:  no neurological deficits atalgic gait   Impression & Recommendations:  Problem # 1:   HIP PAIN, RIGHT, CHRONIC (ICD-719.45) Assessment Deteriorated  Chronic issue on chronic pain meds. I reviewed all previous xrays and MRIs.  She has some degenerative changes but joint space preserved. Because she is part of a pain clinic, no further intervention regarding pain medication can be implemented without compromising her agreement to the [facility]. I have advised her to f/u with Dr. Farris Has to be evaluated and he can decide if any further testing is warranted. No red flags on my exam.  The following medications were removed from the medication list:    Diclofenac Sodium 75 Mg Tbec (Diclofenac sodium) .Marland Kitchen..Marland Kitchen Two times a day as needed for pain Her updated medication list for this problem includes:    Hydrocodone-acetaminophen 10-325 Mg Tabs (Hydrocodone-acetaminophen)    Cyclobenzaprine Hcl 10 Mg Tabs (Cyclobenzaprine hcl)  Orders: [facility]- Est Level  3 ([address])  Complete Medication List: 1)  Hydrocodone-acetaminophen 10-325 Mg Tabs (Hydrocodone-acetaminophen) 2)  Cyclobenzaprine Hcl 10 Mg Tabs (Cyclobenzaprine hcl)  Patient Instructions: 1)  I recommend that you contact your orthopedist to be evaluated for uncontrolled right hip pain.

## 2010-11-04 NOTE — Assessment & Plan Note (Signed)
Summary: hip pain,tcb   Vital Signs:  Patient profile:   50 year old female Weight:      204.2 pounds Temp:     98.5 degrees F oral Pulse rate:   76 / minute Pulse rhythm:   regular BP sitting:   123 / 85  (left arm) Cuff size:   large  Vitals Entered By: Loralee Pacas CMA (Feb 02, 2010 8:41 AM) CC: hip pain Pain Assessment Patient in pain? yes     Location: hip Intensity: 9 Comments right hip pain that radiates to her lower back and knee    Primary Care Provider:  Delbert Harness MD  CC:  hip pain.  History of Present Illness: 50 y/o female with h/o chronic R hip pain followed by Dr. Farris Has of ortho and pain center presents with complaints of continued R hip pain and difficulty sleeping secondary to pain. reports having so much pain that she missed school friday and monday. unable to sleep but a few hours at night due to pain. saw Dr. Farris Has  ~2 weeks ago and discussed options. plan for joint injections starting tomorrow.  ?learning disability- patient reports lifelong problems with paying attention, learning. never has been evaluated.    Habits & Providers  Alcohol-Tobacco-Diet     Tobacco Status: current     Tobacco Counseling: to quit use of tobacco products     Cigarette Packs/Day: 0.25  Current Medications (verified): 1)  Hydrocodone-Acetaminophen 10-325 Mg Tabs (Hydrocodone-Acetaminophen) 2)  Cyclobenzaprine Hcl 10 Mg Tabs (Cyclobenzaprine Hcl)  Allergies (verified): No Known Drug Allergies  Social History: Packs/Day:  0.25  Physical Exam  General:  VS Reviewed. Obese, non ill appearing, appears mild uncomfortable Neurologic:  antalgic gait   Impression & Recommendations:  Problem # 1:  HIP PAIN, RIGHT, CHRONIC (ICD-719.45) Assessment Unchanged  patient ultimately just wanted a note for school. we are not actively managing her hip pain or her pain medications. explained that we do not provide notes retroactively particularly if patients are not seen in  the office for a problem. she was encouraged to call for a same day appointment or go to urgent care whenever she has a problem that is preventing her from going to school or work. she expressed understanding. a note was provided for this one time ONLY.   Her updated medication list for this problem includes:    Hydrocodone-acetaminophen 10-325 Mg Tabs (Hydrocodone-acetaminophen)    Cyclobenzaprine Hcl 10 Mg Tabs (Cyclobenzaprine hcl)  Orders: [facility]- Est Level  3 ([address]10)  Problem # 2:  UNSPECIFIED SLEEP DISTURBANCE (ICD-780.50) Assessment: New  due to pain. rx for hydroxyzine.   Orders: [facility]- Est Level  3 ([address])  Problem # 3:  ? of LEARNING DISABILITY (ICD-315.2) Assessment: New given [facility] contact information for full evaluation  Patient Instructions: 1)  Hope things get better for you! 2)  I have sent a prescription for HYDROXYZINE to your pharmacy. You can use it for help sleeping.  Prescriptions: HYDROXYZINE HCL 50 MG TABS (HYDROXYZINE HCL) one half tab to one full tab by mouth 30 minutes before bedtime as needed for insomnia  #90 x 1   Entered and Authorized by:   Lequita Asal  MD   Signed by:   Lequita Asal  MD on 02/02/2010   Method used:   Electronically to        [facility] [address] [phone]* (retail)       [address]       [place]  [place], [place]  [address]       Ph: [phone] or [phone]       Fax: [phone]   RxID:   [id number]

## 2010-11-04 NOTE — Letter (Signed)
Summary: Results Follow-up Letter  [facility]  [address]   [place], [place] [address]   Phone: [phone]  Fax: [phone]    08/18/2010  [address] [place], [place]  [address]  Dear Ms. [name],   The following are the results of your recent test(s):  Your labwork was normal.  Please let us know if you have further concerns.  Sincerely,  Delbert Harness MD [facility]           Appended Document: Results Follow-up Letter mailed

## 2010-11-04 NOTE — Letter (Signed)
Summary: Out of School  [facility]  [address]   [place], [place] [address]   Phone: [phone]  Fax: [phone]    Feb 02, 2010   Student:  MILYNN QUIRION    To Whom It May Concern:   For Medical reasons, please excuse the above named student from school for the following dates:  Start:   January 29, 2010  End:    Feb 01, 2010  If you need additional information, please feel free to contact our office.   Sincerely,    Lequita Asal  MD    ****This is a legal document and cannot be tampered with.  Schools are authorized to verify all information and to do so accordingly.

## 2010-11-04 NOTE — Letter (Signed)
Summary: Generic Letter  [facility]  [address]   [place], [place] [address]   Phone: [phone]  Fax: [phone]    06/30/2010  Bridget [name] [address] [place], [place]  [address]  Dear Ms. [name],  After our conversation today about your concern for finding a career in whcih you can provide for your family, I found a resource that may be of help to you.  It is called [employer].  They offer support services and life skills classes to help you overcome barriers to employment.  Please see the enclosed materials.   Sincerely,   Delbert Harness MD

## 2010-11-04 NOTE — Assessment & Plan Note (Signed)
Summary: f/u/bmc   FLU SHOT GIVEN TODAY.Jimmy Footman, CMA  August 16, 2010 5:17 PM   Vital Signs:  Patient profile:   50 year old female Height:      61.5 inches Weight:      216.6 pounds BMI:     40.41 Temp:     98.5 degrees F oral Pulse rate:   89 / minute BP sitting:   126 / 81  (left arm) Cuff size:   regular  Vitals Entered By: Jimmy Footman, CMA (August 16, 2010 2:13 PM) CC: med f/u, lower back & hip pain 7/10 Is Patient Diabetic? No Pain Assessment Patient in pain? yes     Location: back & hip Intensity: 7 Type: sharp   Primary Care Luisantonio Adinolfi:  [facility] MD  CC:  med f/u and lower back & hip pain 7/10.  History of Present Illness: 50 yo here for follow-up: today's concerns almost exactly mirror last visit- see pasted below  Depression:  took effexor for 5 days at one pill per day then increase to twice daily for 3 days and then quit because it did not work and she felt too sleepy.  School performance:  Said her orthopedist (Dr. Farris Has) wrote a ltter stating her medications make her sleepy and she now has extra time for tests.  She says neither her orthopedist nor he pain clinic doctor will giev details on her "learning disability" so she may have a note-taker for school disability and asks if i will write a letter.  Chronic pain:  managed at [facility].  Today she requests "functional assessment" because she cannot sit for long periods of time and is thinking about applying for disability.  Previous Office Visit:  1.  Learning disability: Consulting civil engineer at [employer]  Would like me to write letter to "get me services i need"  She would like extra test taking time, get qualified to have a note-taker in class for her,  have tutoring.  Has not had a diagnosis of learning disorder.  Called [employer] for eval but says she cannot afford to go.  2.  chronic pain:  [facility].  She requests a rheumatology consult again because she feels they are only treating pain and not  the source.  She has back pain and leg pain when very active and stangin.  No swollen joints or other arthropathy.  3. depression: crys frequently.  No Si, HI.  Denies manic symptoms.  Has been treated before but does not know with what.  Never hospitalized.  Habits & Providers  Alcohol-Tobacco-Diet     Tobacco Status: current     Cigarette Packs/Day: 0.5  Current Medications (verified): 1)  Hydrocodone-Acetaminophen 10-325 Mg Tabs (Hydrocodone-Acetaminophen) 2)  Cyclobenzaprine Hcl 10 Mg Tabs (Cyclobenzaprine Hcl) 3)  Hydroxyzine Hcl 50 Mg Tabs (Hydroxyzine Hcl) .... One Half Tab To One Full Tab By Mouth 30 Minutes Before Bedtime As Needed For Insomnia 4)  Diclofenac Sodium 75 Mg Tbec (Diclofenac Sodium) .... One Tablet Twice A Day 5)  Effexor Xr 75 Mg Xr24h-Cap (Venlafaxine Hcl) .... Take One Tablet Daily For 7 Days, Then Increase To Two Tablets Daily  Allergies: No Known Drug Allergies PMH-FH-SH reviewed for relevance  Social History: Packs/Day:  0.5  Review of Systems      See HPI  Physical Exam  General:  Sad appearing.  vitals reveiwed. Lungs:  Normal respiratory effort, chest expands symmetrically. Lungs are clear to auscultation, no crackles or wheezes. Heart:  Normal rate and  regular rhythm. S1 and S2 normal without gallop, murmur, click, rub or other extra sounds. Psych:  Oriented X3, memory intact for recent and remote, depressed affect, and tearful.     Impression & Recommendations:  Problem # 1:  DEPRESSIVE DISORDER, NOS (ICD-311) Discussed with patient  that treating depression with also have positive effects on pain management and concentration.  Will check labs today.  Advised restarting effexor and staying at 75 mg for several weeks and possibly taking it at night to combat somnolence.  May increase up to 150 once tolerated.  She has been seeing a counselor but would like to see someone new.  Gave her Dr. Carola Rhine card.  Her updated medication list for this  problem includes:    Hydroxyzine Hcl 50 Mg Tabs (Hydroxyzine hcl) ..... One half tab to one full tab by mouth 30 minutes before bedtime as needed for insomnia    Effexor Xr 75 Mg Xr24h-cap (Venlafaxine hcl) .Marland Kitchen... Take one tablet daily  Orders: Comp Met[facility] [phone]) CBC-[facility] (82956) TSH-[facility] ([id number])  Problem # 2:  HIP PAIN, RIGHT, CHRONIC (ICD-719.45) Reminded her that I cannot prescribe anything as she is managed by [facility] for pain managemet[facility].  asked her to followup withorthopedics if her hip is continuing to cause her pain.  I put in referral for functional assessment from PT er her request.  I advised patient there may be an out of pocket cost for this.  Her updated medication list for this problem includes:    Hydrocodone-acetaminophen 10-325 Mg Tabs (Hydrocodone-acetaminophen)    Cyclobenzaprine Hcl 10 Mg Tabs (Cyclobenzaprine hcl)    Diclofenac Sodium 75 Mg Tbec (Diclofenac sodium) ..... One tablet twice a day  Problem # 3:  ? of POOR CONCENTRATION (ICD-310.1) Likely due to depression.  She s unable to afford psychological testing.  Will address further once depression adequately treated  Orders: CBC[facility] (69629)  Complete Medication List: 1)  Hydrocodone-acetaminophen 10-325 Mg Tabs (Hydrocodone-acetaminophen) 2)  Cyclobenzaprine Hcl 10 Mg Tabs (Cyclobenzaprine hcl) 3)  Hydroxyzine Hcl 50 Mg Tabs (Hydroxyzine hcl) .... One half tab to one full tab by mouth 30 minutes before bedtime as needed for insomnia 4)  Diclofenac Sodium 75 Mg Tbec (Diclofenac sodium) .... One tablet twice a day 5)  Effexor Xr 75 Mg Xr24h-cap (Venlafaxine hcl) .... Take one tablet daily  Patient Instructions: 1)  Your orthopedist and pain doctor are the best place to discuss your chronic pain management 2)  I think it is very important to treat your depression to give yo the best chance to have the best concentration and reduce your pain 3)  Stay on effexor one tablet daiy- if it makes you too  sleep y try taking it at  night 4)  follow-up in 2-3 weeks or sooner if needed Prescriptions: EFFEXOR XR 75 MG XR24H-CAP (VENLAFAXINE HCL) take one tablet daily  #30 x 1   Entered and Authorized by:   Delbert Harness MD   Signed by:   Delbert Harness MD on 08/16/2010   Method used:   Electronically to        [facility]  [address] [phone]* (retail)       [address]       [place], [place]  [address]       Ph: [phone] or [phone]       Fax: [phone]   RxID:   [id number]    Orders Added: 1)  Comp Met[facility] [[id number]]  2)  CBC-[facility] [85027] 3)  TSH-[facility] [[id number]]     Prevention & Chronic Care Immunizations   Influenza vaccine: given  (09/18/2009)   Influenza vaccine due: 09/18/2010    Tetanus booster: 09/18/2009: given TDAP   Tetanus booster due: 09/19/2019    Pneumococcal vaccine: Not documented  Other Screening   Pap smear: NEGATIVE FOR INTRAEPITHELIAL LESIONS OR MALIGNANCY.  (02/20/2009)   Pap smear due: 02/21/2011    Mammogram: Done.  (06/08/2005)   Mammogram due: Not Indicated   Smoking status: current  (08/16/2010)  Lipids   Total Cholesterol: Not documented   LDL: Not documented   LDL Direct: Not documented   HDL: Not documented   Triglycerides: Not documented  Appended Document: Orders Update    Clinical Lists Changes  Orders: Added new Test order of [facility]- Est  Level 4 (59563) - Signed      Appended Document: f/u/bmc Faxed referral to [facility]

## 2010-11-16 ENCOUNTER — Ambulatory Visit: Payer: Self-pay | Admitting: Family Medicine

## 2010-11-19 ENCOUNTER — Ambulatory Visit (INDEPENDENT_AMBULATORY_CARE_PROVIDER_SITE_OTHER): Payer: Medicaid Other | Admitting: Family Medicine

## 2010-11-19 ENCOUNTER — Encounter: Payer: Self-pay | Admitting: Family Medicine

## 2010-11-19 VITALS — BP 126/87 | HR 84 | Temp 99.2°F | Ht 61.0 in | Wt 220.0 lb

## 2010-11-19 DIAGNOSIS — B070 Plantar wart: Secondary | ICD-10-CM

## 2010-11-19 DIAGNOSIS — F17200 Nicotine dependence, unspecified, uncomplicated: Secondary | ICD-10-CM

## 2010-11-19 DIAGNOSIS — F329 Major depressive disorder, single episode, unspecified: Secondary | ICD-10-CM

## 2010-11-19 MED ORDER — BUPROPION HCL ER (SR) 150 MG PO TB12: 150.0000 mg | ORAL_TABLET | Freq: Two times a day (BID) | ORAL | Status: DC

## 2010-11-19 NOTE — Progress Notes (Signed)
  Subjective:    Patient ID: Bridget [name], female    DOB: 09-12-1961, 50 y.o.   MRN: [id number]  HPI Foot pain:  Has callous on right foot for several months, has tried shaving it down with little relief.  Very painful.  Depression:  Never filled celexa which was prescribed at last visit.  Continues to have sadness, anxiety  Tobacco abuse:  Would like to try to stop smoking again.  Has attempted twice before quitting for 6 months and several weeks.  Felt being around other smokers was a hurdle for her.  Those times quit without medication assistance.  Cites wanting to avoid negatively impacting her grandchildrren's health as a big factor.  Smoked a half pack per day since teens.    Review of Systems neg except per HPI     Objective:   Physical Exam  Constitutional: She appears well-developed and well-nourished.  Skin:       Right foot: plantar wart on lateral side.  Pared with scalpel and core removed.    Psychiatric: She has a normal mood and affect. Her behavior is normal.          Assessment & Plan:

## 2010-11-19 NOTE — Patient Instructions (Signed)
Apply salycylic acid (compound W available at drugstore) to foot twice a day.  Apply, let it dry, and apply again twice a day for 6-8 weeks or until callous gone. New medicine- Wellbutrin for stopping smoking and depression Start this medicine 1 week before you decide to stop smoking 1-800-quitnow for counselors Follow-up with me 1-2 weeks after your quit date

## 2010-11-21 ENCOUNTER — Encounter: Payer: Self-pay | Admitting: Family Medicine

## 2010-11-21 DIAGNOSIS — B070 Plantar wart: Secondary | ICD-10-CM | POA: Insufficient documentation

## 2010-11-21 NOTE — Assessment & Plan Note (Signed)
Pared wart down today, advised to use topical salicylic acid twice daily.

## 2010-11-21 NOTE — Assessment & Plan Note (Signed)
Will start wellbutrin for tobacco cessation as well as depression.  Patient has seen pharm clinic before, offered return visit.  She prefers to follow-up with me in 2 weeks after quit date.  Given [place] quit line info.

## 2010-11-21 NOTE — Assessment & Plan Note (Signed)
Start Wellbutrin today

## 2011-01-13 LAB — CULTURE, ROUTINE-ABSCESS

## 2011-02-15 NOTE — Assessment & Plan Note (Signed)
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   She is back in today for refill of her medications.   At the last visit, she had complaints of right hip pain and hip  radiographs were ordered, however, she did not get them completed.  She  stated she had difficulty coming up with the payment and did not have  time to get them done.  She is back in today and has complaints of low  back and lateral hip pain as well as bilateral knee pain.  States her  average pain overall is about an 8 on a scale of 10.  Currently in the  clinic today, it is about a 7.   She states her pain is variable, sometimes sharp, burning, dull,  stabbing, tingling, aching.   States her pain interferes significantly with her activity level.   Pain is constant, throughout the day, night, evening, morning.   Sleep is poor.   Pain is worse with activities.  Hip pain especially worse with standing  and walking.   Pain improves with rest, heat, therapy, pacing her activities,  medications, TENS unit, occasionally injections.   She gets good relief with current medications that she is on.   FUNCTIONAL STATUS:  The patient reports that she is independent with  ambulation for the most part.  She has difficulty with stairs.  She is  driving.  She is independent with her self-care, occasionally needs some  assistance with dressing, bathing, toileting, meal prep, household  duties, shopping.   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   She denies problems controlling bowel or bladder.  Denies suicidal  ideation.   Reports occasional constipation.   No changes in past medical, social or family history since last visit.  She continues to smoke about a pack of cigarettes a day.   PHYSICAL EXAMINATION:  VITAL SIGNS:  Blood pressure is 121/57, pulse 78,  respirations 20, 98%  saturated on room air.  GENERAL APPEARANCE:  She is an obese, African American female who does  not appear in any distress.   She is oriented x3.  Speech is clear.  Affect is bright.  She is alert,  cooperative and pleasant.  She follows commands without difficulty.   Transition from sitting to standing is done with ease.  Gait in the room  slightly wide based, slightly antalgic.  Tandem gait and Romberg's test  are all performed adequately.  She is able to flex with 90 degrees in  her lumbar spine. She has about 10 degrees of extension.  No complaints  with this.  Lateral bending to the left increases her pain slightly in  the right side of her hip.  Lateral bending to the right does not bother  her.   Reflexes are symmetric and intact in the lower extremities.  Motor  strength is 5/5 without focal deficit.  Sensory exam is intact to light  touch and temperature.   She has tenderness along the medial joint line and lateral joint line of  the right knee.  No effusion is appreciated.  She has no medial or  lateral instability appreciated or AP instability appreciated.   Internal and external rotation of the right hip does increase her pain  especially in the groin area.  Internal as well as external rotation of  the  right hip aggravate her left hip.  No pain with internal or external  rotation.   IMPRESSION:  1. New right hip pain with antalgic gait and pain with internal and      external rotation.  Radiographs were ordered last month.  She did      not get these completed, however, will reorder them again this      month.  2. Lumbago with history of mild lumbar spondylotic changes without      nerve root compression.  3. Patient is tender along the lateral hip today, suggestive again of      some trochanteric bursitis, mild iliotibial band syndrome as well.  4. Bilateral osteoarthritis, worse on the right than on the left.      Patient states she would like to follow back up  with [facility].   PLAN:  Will refill her hydrocodone 7.5/325 one p.o. b.i.d. to q.i.d. #75  with no refills.  Will obtain a urine drug screen today.  She does not  need any refills on the Voltaren gel or Prilosec at this time.  She was  given three refills back in the end of February.  Will see her back in a  month.  Radiographs of the right hip are ordered.  Urine drug screen  will be checked as well. She is stable on the above medications.           ______________________________  Bridget [name], M.D.     DMK/MedQ  D:  01/28/2008 09:26:36  T:  01/28/2008 09:54:22  Job #:  [id number]

## 2011-02-15 NOTE — Assessment & Plan Note (Signed)
[name] is a 50 year old African American female who has 2 children  still at home, and is working about 15 hours a week.  She is back in  today for refill of her medications.   She states she has had right hip pain off and on for several months now,  but over the last several days, it has been more constant, and she is  limping.   Average pain is about an 8 on a scale of 10.  Pain is described as  constant.  Sometimes it is more intermittent, sharp, burning, stabbing,  dull aching in nature.  It interferes with activities significantly.  Poor sleep is noted.  Patient's pain is worse with walking, and a  variety of other activities.  Improves with rest, heat therapy, pacing  her activities, medication, and TENS unit.  She gets good relief with good current medications prescribed by this  clinic.   Medications from this clinic include:  1. Flexeril 5 mg 1 p.o. nightly p.r.n.  2. Ambien 5 mg 1 p.o. nightly p.r.n.  3. Norco 7.5/325 two to four tablets per day, but not more than 75      tablets per month.  4. Voltaren 75 mg 1 p.o. b.i.d. not more than 10 days per month.   She is able to walk about 35 minutes at a time.  She is able to climb  stairs and drive.  She is independent with her self-care.  Needs some  assistance with high-level activities.   REVIEW OF SYSTEMS:  Positive for poor appetite.  Constipation is  improved.  Occasional limb swelling and night sweats are noted.  I asked  her to follow up with primary care for these.   PAST MEDICAL HISTORY:  Otherwise, unchanged.   SOCIAL HISTORY:  Otherwise, unchanged.   FAMILY HISTORY:  Otherwise, unchanged.   EXAMINATION:  Blood pressure is 113/53.  Pulse 79.  Respirations 18.  Saturations 97% on room air.  She is an obese [place] female who does not appear in any  distress.  She is oriented x3.  Speech is clear.  Affect is bright.  She is alert,  cooperative, and pleasant.  Follows commands without any  difficulty.  Transitioning from sitting to standing is done with ease.  Coordination  is normal.  Tandem gait and Romberg test are performed adequately.  Limitations are noted in lumbar range of motion, flexion, as well as  extension.  Reflexes are symmetric and intact in lower extremities.  No abnormal  tone is noted.  No clonus is noted.  Sensation is intact.  Straight leg raise is negative.  Examination of her right hip reveals normal range of motion with  internal and external rotation on the left, and no pain.  Internal and  external rotation about 35 to 40 degrees in the left hip.  In the right  hip, internal and external increases pain in the groin region with this  passive maneuver.  Gait in the room is antalgic.  Decreased weightbearing is noted  throughout the right lower extremity during gait cycles.   IMPRESSION:  1. New right hip pain with antalgic gait and pain with internal and      external rotation of the right hip.  2. Lumbago with history of mild lumbar spondylotic changes without      nerve root compression.  3. Intermittent history of trochanteric bursitis.  4. Bilateral knee osteoarthritis, right worse than left.  Had followed  up with [facility] in the past.  Patient was not      interested in surgical management about a year ago.   Should the knees continue to bother her, I asked her to follow back up  with them on a p.r.n. basis.  We will see her back in a month.  Medications prescribed today include Norco 7.5/325 one p.o. b.i.d. to  q.i.d., number 75 per month.  We will also write an order for hip x-  rays.           ______________________________  Bridget [name], M.D.     DMK/MedQ  D:  12/28/2007 10:04:19  T:  12/28/2007 10:55:49  Job #:  [id number]

## 2011-02-15 NOTE — Assessment & Plan Note (Signed)
Bridget [name] is a 50 year old African American female who is being seen  in our pain and rehabilitative clinic for predominantly lumbago.  She  also has some other pain problems including intermittent right shoulder  problem, left knee pain, intermittent right ankle pain.   She is back in today and reports her average pain is between an 8 and a  7 on a scale of 10.  She was last seen 07/16/07. In the interim she has  had no new problems.  She has stopped her Topamax because she had some  tingling in her feet.   She complains of some weight gain since the discontinuation of her  Topamax.  Pain is described as very bold, sometimes more intermittent,  sometimes more constant, sharp, dull, stabbing, burning, aching in  nature.   She is fairly relieved with current meds that she is on. 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  No new problems regarding health  and history form.  Does admit to some depression.  Denies suicidal  ideation, bowel and bladder, without any problems.  No changes in her  past medical, social or family history.  Smokes 1/2 pack of cigarettes a  day.   MEDICATIONS:  Prescribed by our clinic include Flexeril 5 mg 1 p.o.  q.h.s. p.r.n.  Norco 7.5/325 up to 3-4 x a day #75 per month. Ambien on  a p.r.n. basis not more than 10 times per month and Voltarin 75 mg up to  7 days per month.   PHYSICAL EXAMINATION:  VITALS:  Blood pressure is 118/71, pulse 72,  respirations 16, 100% saturated on room air.  GENERAL:  She is a well-developed, obese female who appears her stated  age and does not appear in any distress.  Her affect is bright.  She is  alert, cooperative and pleasant and follows commands without any  problems.  She transitions from sitting to standing with ease.  Does not  display any pain behaviors with forward flexion, extension  or lateral  flexion of her lumbar spine.  She does have some limitations, however.  Tandem gait and Romberg test are  performed adequately.   Reflexes are evaluated are 2+ at the patellar.  Tendon is 1+ at the  Achilles tendons.  Motor strength is excellent in the lower extremities  without focal weakness.  Straight leg raise negative, no abnormal tone  is noted no clonus is noted.   IMPRESSION:  1. Lumbago with history of mild lumbar spondylitic changes without      recompression.  2. History of bilateral trochanteric bursitis.  3. Bilateral knee osteoarthritis.  4. Intermittent right heal pain that is currently not a problem at      this time.   PLAN:  Will refill the following medications for her Norco 7.5/325 up to  2-3 x a day #75 per month, no refills.  Voltarin 75 mg 1 p.o. b.i.d.  p.r.n. back pain #20.  Her Topamax has been discontinued.  She reports  no problems taking this medication.  She takes them as prescribed.  There is no aberrant behavior appreciated with them.  She is able to  maintain a relatively functional life style.  Will see her back in a  month.           ______________________________  Brantley Stage, M.D.    DMK/MedQ  D:  08/13/2007 09:39:56  T:  08/13/2007 10:56:13  Job #:  [id number]

## 2011-02-15 NOTE — Assessment & Plan Note (Signed)
[name] is a 50 year old African American woman who is the  mother of 2 children who has been sent back to our clinic by Dr. Farris Has.  The patient states that Dr. Farris Has does not want to prescribe pain  medicine any more because he told her she was addicted.   Her chief pain complaint is low back pain and occasional knee pain.   Pain is worse when she is active and does not get a chance to pace her  activity.  Her average pain is about 6 on a scale of 10.  These tend to  be in the poor side.  Pain is described as intermittent, sharp, burning,  dull, stabbing, aching in nature.  Pain is worse with standing, bending,  sitting, walking, improves with rest, heat, pacing her activities,  medication, and TENS unit.  She gets fair relief from her current  medications.   Functional status is held.   She is able to walk 45 minutes to 60 minutes at a time.  She can climbs  stairs and then drives.  She is currently going to the school.  She has  been riding the bus independent with all self-care and higher level  household tasks.   REVIEW OF SYSTEMS:  Denies heart problems, controlling bowel or bladder.  Denies depression, anxiety, or suicidal ideation.  Report occasional  trouble walking, spasms, and tingling in the lower extremities.   Review of systems also remarkable for poor appetite.  She states she has  been trying to lose weight, she has lost about 55 pounds.  She is eating  less and has been a lot more active in the last year.   Past medical, social, family history, essentially unchanged.  She had an  ovarian cyst noted on a hip MRI back in July.  She is followed up with  Dr. Theodosia Blender for this and continues to be followed by him.   She is single, lives with 63 year old daughter, 79 year old son.  Smokes  one and a half pack of cigarettes a day.   No changes in family history since last visit.   MEDICATIONS:  She brings into clinic and states that she is currently on  include,  1. Norco 7.5/325 two to three times a day.  2. Flexeril 5 mg p.r.n.   No known drug allergies.   Exam today, blood pressure is 110/62, pulse 59, respirations 18, 99%  saturated on room air.  She is an obese [place] American female who does  not appear in any distress.   She is oriented x3.  Speech is clear.  Affect is bright.  She is alert,  cooperative, and pleasant.  Follows commands without difficulty.  Answers questions appropriately.   Cranial nerves and coordination are intact.  Reflexes are 2+ in the  lower extremities at the patellar and Achilles tendon without side-to-  side differences.  No abnormal tone is noted.  No clonus is noted.  No  tremors are appreciated.  Sensation is intact to light touch and  vibratory sense.   Motor strength is 5/5 at hip flexors, knee extensors, dorsiflexors,  plantar flexors, EHL.   Straight leg raise is negative.   Transitioning from sitting to standing is done quite easily.  She is  able to get up without any difficulty.  Gait in the room is not  antalgic.  Tandem gait, Romberg test are all performed adequately  without any problems.   Range of motion in the cervical spine is within  normal limits.  She has  full shoulder range of motion without pain.  She has mild limitations in  lumbar forward flexion and reports some tightness in the right low back  with lumbar extension.  Lumbar range of motion is very minimally  limited.   Internal rotation of the right hip exacerbates some mild discomfort in  the medial knee without affecting posterior hip or groin at all.  She  has relatively well-preserved range of motion in both hips with internal  and external rotation.  She has mild medial joint line tenderness  bilaterally and very mild crepitus with flexion and extension of the  left patella.  No effusion is appreciated.  There is no AP laxity and no  medial lateral laxity appreciated today on knee exam in right or left   knee.   She does have some mild tenderness especially on the right trochanter  with palpation, minimally so on the left.   IMPRESSION:  1. Mild lumbar degenerative changes, last MRI was July 02, 2007,      showing T10-L3 unremarkable, minimal changes at L3-4, L4-5, and L5-      S1.  2. Bilateral knee pain.  X-rays from March 2007 were negative at that      time.  3. Mild hip osteoarthritis MRI from July 2009 has showed some minimal      degenerative changes bilaterally.  She underwent a hip injection by      Dr. Bonnielee Haff in July as well.  4. Bilateral trochanteric bursitis, iliotibial band syndrome noted      again on exam.   PLAN:  Suggest physical therapy to address mild degenerative changes in  low back, hips, and knees.   RECOMMENDATIONS:  I recommend lumbar stabilization program, lower  extremities.  Strength and flexibility program.  The patient is  interested in pursuing this.  We would like her eventually to transition  to a water aerobics program.  She has a history of using marijuana, last  May 2009 when she was asked to provide a urine drug screen sample.  She  left the clinic abruptly.  She will be managed in the nonnarcotic means  in this clinic.  She would like to pursue the currently outlined  management plan for her various pain complaints at this time.           ______________________________  Brantley Stage, M.D.     DMK/MedQ  D:  02/09/2009 09:23:39  T:  02/10/2009 00:03:36  Job #:  [id number]   cc:   Dr. Farris Has

## 2011-02-15 NOTE — Assessment & Plan Note (Signed)
Bridget [name] is a 50 year old African American woman who has two  children still at home and is working about 15 hours a week.  She is  back in today requesting refill of her pain medication.  She was last  seen on November 05, 2007.   She states her average pain is about a 7 or 8 on a scale of 10.  Predominantly located in the low back and knee pan.  She states earlier  this month she had increased her activity a bit and developed some  significant right knee pain.  She states that she used ice and heat on  the knee and diminished her activity, and overall, she has had some  improvement in the pain.   Pain is typically worse with activities including walking, bending,  standing, sitting.  Improves with rest, heat, exercise, pacing her  activities, medication, and TENS unit.  Pain is described as constant,  sharp, dull, tingling, stabbing, burning and aching in nature.  She gets  good relief with current medications prescribed by our clinic.   MEDICATIONS FROM THIS CLINIC:  1. Flexeril 5 mg 1 p.o. at night on a p.r.n. basis.  2. Ambien 5 mg 1 p.o. at night on a p.r.n. basis.  3. Norco 7.5/325 two to four tablets a day, not more than 75  tablets      per month.  4. Voltaren 75 mg 1 p.o. b.i.d.   FUNCTIONAL STATIS:  The patient is able to be up and down throughout  most of the day.  Activity is not significantly limited for the most  part.  She is able to climb stairs and drive.  She is independent with  her self care.  Needs some assistance with higher level activities,  although she does work as an Insurance underwriter.   Denies problems controlling bowel or bladder.  Admits occasional  depression.  Denies suicidal ideation.  Admits to occasional numbness,  tingling and spasms in the back and legs.   REVIEW OF SYSTEMS:  Positive for intermittent constipation and poor  appetite, occasional limb swelling.   PAST MEDICAL/SOCIAL/FAMILY HISTORY:  Unchanged at this time.  Admits to  smoking, denies alcohol use.   EXAMINATION:  On exam today, her blood pressure is 113/54, pulse 79,  respiration 18, 99% saturated on room air.  She is a well developed,  mildly obese, African American female who appears her stated age and  does not appear in any distress.  She is oriented x3.  Her speech is  clear.  Her affect is bright.  She is alert, cooperative and pleasant.  She follows commands easily.   Transitioning from sitting to standing is done with ease.  Gait in the  room is slightly antalgic, decreased weightbearing in the right lower  extremity.  Forward lumbar flexion is approximately 80 degrees.  Extension is about 10 degrees without pain in either direction.  Reflexes are 1+ at patellar and Achilles tendons.  There is no abnormal  tone.  No clonus is noted.  Motor strength is 5/5 at hip flexors, knee  extensors, dorsiflexors, plantar flexors.   No sensory deficits are appreciated today on exam.  Mild tenderness over  the medial joint line on the right without obvious effusion on the right  knee.   IMPRESSION:  1. Lumbago with history of mild lumbar spondylotic changes without      nerve root compression.  2. Intermittent history of trochanteric bursitis, currently not a  problem.  3. History of bilateral knee osteoarthritis, right worse than left, is      currently a problem for her at this time.   PLAN:  1. We will trial her on Voltaren gel.  I asked her not to take it when      she is using by mouth Voltaren.  She was given samples today and a      prescription as well.  I have given her a prescription for Prilosec      too.  We will refill her Norco 7.5/325 one p.o. b.i.d. to q.i.d.      #75, no refills.  We will see her back in a month.   She has been stable on these mediations and has not had any issues with  oversedation.  We did discuss constipation.  This is an issue  intermittently for her. We talked about bowel programs as well as  increasing  fiber in her diet, increasing fluids, walking, and  occasionally using a bowel stimulant as needed, such as Dulcolax  tablets.  We will see her back in a month.  She  has been stable on  these medications, has not exhibited any aberrant behavior.           ______________________________  Brantley Stage, M.D.     DMK/MedQ  D:  11/30/2007 11:32:12  T:  11/30/2007 20:36:58  Job #:  [id number]

## 2011-02-15 NOTE — Assessment & Plan Note (Signed)
HISTORY:  [name] is a 50 year old African American female, who is  followed in our Pain [facility] Rehabilitative Clinic for multiple chronic pain  complaints.  She is back in today and states she has continued pain  problems in her low back, her hip, and her knees.  She states she had a  recent injection into the right hip.  She believes it may have helped  somewhat; however, she is not interested in pursuing further injections  at this point.  She is discussing possible hip replacement with Dr.  Remigio Eisenmenger.  She has an appointment with him on April 10, 2008 to discuss this  further.   She states she is currently receiving some medication through his office  including hydrocodone, Voltaren, and Flexeril, and she does not need  medications at this time.  She is on non-narcotic pain management  program with our clinic at this time.   She has some complaints of right knee pain as well, which is not new,  worse with ambulation, mildly left knee pain is noted.   Average pain is described as 7-8 on a scale of 10.  Pain is described as  variable, between intermittent and  constant, sharp, dull, tingling,  burning, stabbing, and aching in nature, interfering significantly with  activity level, reports sleep is overall poor.  Pain is constant  throughout the day without any waxing and waning.  Pain is worse with  activities, improves with rest, heat, therapy, pacing her activities,  medication, TENS unit, and injections.   She reports good relief with current meds being prescribed.   Functional status is as follows.  She is able to walk about 35 minutes  at a time.  She is able to climb stairs and drive.  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  She is independent with her self  care, needs some assistance with heavy or household activities.   She denies problems controlling bowel or bladder.  Admits depression and  anxiety.  Denies suicidal ideation.   REVIEW OF SYSTEMS:   Otherwise, noncontributory at this time.   Past medical, social, and family history are unchanged.  She smokes a  pack of cigarettes a day.  She occasionally uses alcohol.  She lives  with her children.   The patient is no longer obtaining medications through this clinic at  this time.   PHYSICAL EXAMINATION:  VITAL SIGNS:  Blood pressure is 124/59, pulse 71,  respirations 18, and 98% saturated on room air.  GENERAL:  She is an obese [place] American female, who does not appear  in any distress.  She is oriented x3.  Speech is clear.  Her affect is  bright.  She is alert, cooperative, and pleasant.  She follows commands  without difficulty.   She is able to transition from sitting to standing without problems.  Her gait in the room is slightly antalgic.  Decreased weightbearing to  the right lower extremity.  She reports that her right knee is bothering  her more than the right hip today.   Reflexes are symmetric and intact in the upper and lower extremities.  Motor strength is 5/5 without focal deficit.  Tone is normal.  No clonus  is noted.  Sensory exam is intact in the lower extremities.  Increased  pain is noted with internal rotation of the right hip, which is somewhat  improved from the last visit after this injection.  She has some  crepitus on  the right with flexion extension at the knee.  Fusion is not  noted.  She has some mild joint line tenderness.   IMPRESSION:  1. Lumbago with history of mild lumbar spondylotic changes without      nerve root compression.  2. History of intermittent trochanteric bursitis.  3. Intermittent Achilles tendinitis on the right.  4. Bilateral knee osteoarthritis.  5. Mild right hip osteoarthritis status post hip injection per Dr.      Page Spiro office.  6. History of marijuana use.  The patient did not provide urine drug      sample when requested a month ago.   At this point, we will see her back on a p.r.n. basis to help or manage   her pain in a non-narcotic manner.  She is currently being followed  closely by Dr. Remigio Eisenmenger and is considering right hip replacement with his  group.           ______________________________  Brantley Stage, M.D.     DMK/MedQ  D:  03/21/2008 11:55:27  T:  03/22/2008 01:43:47  Job #:  [id number]   cc:   Dr. Remigio Eisenmenger

## 2011-02-15 NOTE — Assessment & Plan Note (Signed)
Bridget [name] is a 50 year old African-American female who is being  followed in our pain and rehabilitative clinic for predominantly low  back pain. She is back in today and states her average pain is about an  8-9 on a scale of 10. She describes this as intermittent, sometimes more  constant, sharp, burning, dull, stabbing, tingling, aching in nature;  interfering significantly with her general activity, relationships with  others and enjoyment of life.   She states her average pain is worse during all times of the day. Sleep  tends to be poor. Pain is worse with most activities including walking,  bending, sitting, standing and activity and a variety of other  activities, improves with rest, heat, exercise, pacing her activities,  medications and TENS unit.   She reports good relief with current medications prescribed by our  clinic.   MEDICATIONS:  From this clinic for management of her pain include:  1. Flexeril 5 mg 1 p.o. every night p.r.n. back spasms.  2. Ambien 5 mg 1 p.o. every night p.r.n.  3. Voltaren 75 mg 1 p.o. b.i.d. p.r.n. back pain #20.  4. Norco 7.5/325 one p.o. b.i.d. to q.i.d. #75 per month.   She has continued to take her medications as prescribed. Has not  displayed any aberrant behavior.   Mobility:  The patient is able to walk at least 30 minutes at a time.  She is able to climb stairs and drive. ADLs are high level. She is able  to independently feed, dress, bath, toilet herself. She needs occasional  assistance with higher level activities such as meal prep and household  duties. 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   Denies any new problems with her bladder or bowel. Admits to some  intermittent numbness and tingling, trouble walking, spasms. Denies  suicidal ideation. Denies depression or anxiety.   Reports recent poor appetite but is trying to loose weight and is eating  a low-fat diet currently.   No other change in the past medical,  social, family history since last  visit.   On exam, blood pressure is 134/70, pulse 77, respirations 18, O2  saturation is 98% on room air.   She is obese, well-developed, African-American female who does not  appear in any distress. She is oriented x3. Her speech is clear. Her  affect is bright. She is alert, cooperative and pleasant, and she  follows commands without difficulty.   She is able to transition from sitting to standing easily. Gait in the  room is nonantalgic. Tandem gait, Romberg's test, heel toe walking are  all performed adequately.   Mild limitation is noted in the lumbar range of motion in all planes.  Tenderness to palpation is noted especially in the lower lumbar  paraspinal musculature, upper gluteal musculature, as well as in the  gluteus medius region as well.   Reflexes were symmetric and intact in the lower extremities. No abnormal  tone is noted. Motor strength is 5/5. Straight leg raise is negative. No  focal deficits are appreciated.   No new sensory deficits are appreciated.   IMPRESSION:  1. Lumbago with history of mild lumbar spondylotic changes without      nerve root compression.  2. Intermittent history of bilateral trochanteric bursitis. Today, it      seems to be a bit worse on the right.  3. Bilateral knee osteoarthritis.  4. Resolution of right heel pain.   PLAN:  We will refill the following medications  for her today:  1. Ambien 5 mg 1 p.o. every night p.r.n. insomnia #10, no refills.  2. Norco 7.5/325 one p.o. b.i.d. to q.i.d. #75 per month, no refills.  3. Voltaren 75 mg 1 p.o. b.i.d. p.r.n. back pain #20, no refills.   She has been stable on these medications, takes them as prescribed. 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 We will see her back in a month.           ______________________________  Brantley Stage,  M.D.     DMK/MedQ  D:  10/08/2007 10:48:40  T:  10/08/2007 12:50:11  Job #:  [id number]

## 2011-02-15 NOTE — Assessment & Plan Note (Signed)
[name] was last seen by me on March 05, 2007.  She had two  intervening nursing visits in the interim.  She is back in today and  states that her daughter is about to give birth any minute over at  [facility].  She is requesting a refill of her medications.   She states her average pain is about an 8 to 9 on a scale of 10, and  describes it as sharp, burning, stabbing, tingling, aching, varying  between intermittent and constant.  The pain is localized to the right  shoulder, low back, down legs, knees and right heel.   She is able to walk about 30 minutes as a time.  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   Admits to some numbness, tingling, spasm, depression.  Denies suicidal  ideation.  Independent with her self care for the most part.  Needs some  assistance with higher level activities such as meal prep, household  duties, shopping.   She reports a weight gain of 20 pounds since she stopped smoking and  stopped the Topamax.   Past medical, social, family history otherwise unchanged.  She has a  daughter who lives with her who has a 49-year-old and is about to give  birth to a new baby possibly today.   MEDICATIONS:  Medications prescribed by this clinic include Norco  7.5/325 two to three times a day, Prilosec on an as needed basis,  Lidoderm as needed and Flexeril 5 mg at bedtime.  The patient  discontinued Topamax one month ago.   EXAMINATION:  Blood pressure is 112/54, pulse is 71, respirations 18, 99  saturated on room air.   She is an obese, African American female who appears her stated age but  does not appear in any distress.  She is oriented x3.  Speech is clear.  Affect is bright, alert and cooperative and pleasant.  Follows commands  without any difficulty.   Transitions from sitting to standing easy, gait in the room is not  antalgic.  Tandem gait and Romberg's test are performed adequately.   Reflexes are symmetric at the patellar  tendons, decreased at the  Achilles tendons bilaterally.  Motor strength is 5/5 at hip flexors,  knee extensors, dorsiflexors, plantar flexors, EHL.   IMPRESSION:  1. Lumbago with history of mild lumbar spondylitic changes without      root compression.  2. History of bilateral trochanteric bursitis.  3. Bilateral knee osteoarthritis.  4. Intermittent right heel pain.   PLAN:  Refill the following medications for her:  Norco 7.5/325 1 by  mouth two to three times daily as needed for back pain, #75, and  Flexeril 5 mg 1 by mouth at bedtime as needed for back spasm, #30, 3  refills.   The patient has discontinued Topamax on her own approximately one month  ago.  Believes that part of her weight gain may be due to the  continuation of Topamax, which is most likely correct in addition to her  discontinuing her  smoking.  She states the numbness and tingling in her legs are no worse  after being off Topamax.  We will see her back in 3 months.  Nursing  visit in the interim 2 months.           ______________________________  Brantley Stage, M.D.     DMK/MedQ  D:  06/06/2007 12:54:26  T:  06/06/2007 13:36:50  Job #:  [id number]

## 2011-02-15 NOTE — Assessment & Plan Note (Signed)
Bridget [name] is a 50 year old African American female who was last seen  by me on June 25, 2007.  She is back in today and reports, I am a  whole lot better than I was the last time you saw me.   She has since undergone lumbar MRI which showed no interval change in  the eccentric disk at the right at L3-4, which showed some right  foraminal narrowing, central canal mildly narrowed at that level, and  she had a slight increase in the right paracentral disk protrusion at L4-  5 resulting in some narrowing in the lateral recess on the right and  mild central canal and foraminal narrowing.  Her pain had been in the  left leg.  These findings are toward the right.  MRI was reviewed with  her today.   She states her average pain is about an 8 on a scale of 10.  She is  getting good relief with the meds prescribed currently.  She states the  Topamax may have given her some increased tingling sensation.  However,  she believes she is staring to loose a little weight with it and it is  helping her with her back and buttock pain.   Sleep tends to be poor.  She is able to walk 20-30 minutes at a time.  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  She is  independent with her self care including light meal prep, and some light  household duties, and some shopping if she does not need to carry  anything too heavy.   She denies problems with bowel or bladder.  Admits to occasional  numbness, tingling, spasms.  Admits to depression.  Denies suicidal  ideation.   No changes in her past medical, social, or family history since our last  visit.  She is smoking 3 cigarettes a day approximately.   MEDICATIONS:  Prescribed by our clinic include:  1. Flexeril 5 mg one p.o. q.h.s.  2. Norco 7.5/325 up to 3-4 times a day.  3. Ambien 5 mg on a p.r.n. basis, not more than 10 times a month.  4. Voltaren 75 mg, up to 7 days a month.   PHYSICAL EXAMINATION:  VITAL SIGNS:  Blood  pressure is 116/62, pulse 64,  respirations 18, 99% saturated on room air.  Her weight is 216.6.  GENERAL:  She is an obese [place] American female who appears her stated  age, does not appear in any distress.  She is oriented x3.  Her speech  is clear.  Her affect is bright.  She is alert, cooperative and  pleasant.  She follows commands without difficulty.  MUSCULOSKELETAL:  She transitions from sitting to standing easily and  does not display pain behaviors in our room today.  She is able to bend  forward and extend.  She does not display an pain behaviors with these  motions as well.  Her range is mildly limited, however.  Gait is stable.  Tandem gait and Romberg test performed adequately.  Reflexes are  symmetric at the patellar tendons, slightly diminished at both ankles.  Sensation is intact.  Motor strength is excellent.  No focal weakness is  appreciated.  Some tenderness in the lumbar paraspinal musculature is  noted.   IMPRESSION:  1. Lumbago with a history of mild lumbar spondylotic changes without      root compression.  2. History of bilateral trochanteric bursitis.  3. Bilateral knee osteoarthritis.  4. Intermittent right heel pain, not a problem at this time.   PLAN:  1. Refill the following medications for her:      a.     Topamax 25 mg, one p.o. t.i.d., #90, this is a reduction in       her dose.  We will give her 2 refills.      b.     We will refill her Voltaren 75 mg, one p.o. b.i.d. p.r.n.       back pain, #20.      c.     Norco 7.5/325, two to three times a day, #75 tablets per       month.  2. We will see her back in a month.  3. We will check to see how she is doing on the Topamax.  We may need      to reduce it another 25 mg at that time.  She has been stable on      these medications, has not exhibited any aberrant behavior.  She is      using her medications appropriately and is able to maintain a      functional lifestyle, in fact working even  part-time.           ______________________________  Brantley Stage, M.D.     DMK/MedQ  D:  07/16/2007 12:53:46  T:  07/16/2007 23:20:23  Job #:  [id number]

## 2011-02-15 NOTE — Assessment & Plan Note (Signed)
Bridget [name] is a 50 year old African American woman who has two  children still at home and is working about 15 hours a week.  She has a  49 year old and a 49 year old at home.  She is back in today and reports  overall improvements in her pain averaging 7-8 on a scale of 10.  States  she has been quite functional.  Is happy overall with her pain  management at this time.  Pain typically worsens with activities,  improves with rest, heat, therapy, pacing her activities, medication and  TENS unit.   She reports overall good relief with current medications provided by our  clinic.   MEDICATIONS:  From our clinic include  1. Flexeril 5 mg one p.o. every night p.r.n. back spasms.  2. Ambien 5 mg one p.o. nightly p.r.n.  She is not requesting any refills on Flexeril or her Ambien at this  time.   She is requesting refill on Voltaren 75 mg one p.o. b.i.d. #20 and Norco  7.5/325 one p.o. b.i.d. to q.i.d. #75 per month.   She reports very good relief with these current medications.  Has not  displayed any aberrant behavior, uses them as directed and is getting  good relief and able to maintain a functional lifestyle with their use.   She is currently able to walk 20-40 minutes at a time.  She is able to  climb stairs.  She drives.  She is again working 15 hours a week and  taking care of her family, independent with self-care.  Needs some  assistance with high-level activities.   REVIEW OF SYSTEMS:  Positive for numbness, tingling, trouble walking,  spasms, occasional confusion, weakness.  Denies depression, anxiety,  suicidal ideation.  Denies problems controlling bowel or bladder.   PAST MEDICAL/SOCIAL/FAMILY HISTORY:  Unchanged from last visit.  She is  smoking a pack of cigarettes a day at this time as well.   PHYSICAL EXAMINATION:  VITAL SIGNS:  Blood pressure 113/61, pulse 72,  respirations 18, 98% saturated on room air.  GENERAL APPEARANCE:  She is a well-developed, obese  [place]  female who does not appear in any distress.  She is oriented x3 and her  speech is clear.  Her affect is bright.  She is alert, cooperative and  pleasant.  She follows commands easily.   Transitioning from sitting to standing is done with ease.  Gait in the  room is normal.  Tandem gait, Romberg's test are performed adequately.   She has limitations in lumbar motion in all planes.  Mild tenderness in  the lumbar paraspinal musculature.   Reflexes are symmetric and intact in the lower extremities, 5/5 strength  is noted at hip flexors, knee extensors, dorsiflexors, plantar flexors,  EHL.  Straight leg raising is negative.  No new sensory deficits are  appreciated.  No abnormal tone.  No clonus is noted.   IMPRESSION:  1. Lumbago with history of mild lumbar spondylotic changes without      nerve root compression.  2. Intermittent history of bilateral trochanteric bursitis, currently      not a problem today.  3. History of bilateral knee osteoarthritis, currently not a big      problem at this time.   PLAN:  Will refill her Norco 7.5/325 one p.o. b.i.d. to q.i.d. #75, no  refills.  Voltaren 75 mg one p.o. b.i.d. p.r.n. back pain #20, no  refills.   Patient has not noted any side effects from these medications.  No  abdominal complaints.  No sedation, no constipation.  Is able to  maintain her function and does not display any aberrant behavior with  their use.   Will see her back in one month for refill of her medications.           ______________________________  Brantley Stage, M.D.     DMK/MedQ  D:  11/05/2007 10:49:51  T:  11/05/2007 14:15:33  Job #:  [id number]

## 2011-02-15 NOTE — Assessment & Plan Note (Signed)
Bridget [name] is a 50 year old African American female who is followed in  our pain and rehabilitative clinic for multiple chronic pain complaints.  Her predominant complaints are low back pain, lateral hip pain, right  groin pain especially with walking and bilateral knee pain.   She was recently seen by Dr. Remigio Eisenmenger on May 19 who has provided her 2  knee injections and is ordering a right hip injection for her as well.  States her average pain is about an 8 on a scale of 10.  The pain is  variable in nature, sharp, burning, stabbing, tingling and aching.  Sleep tends to be poor.  Pain is worse with activity in general and also  with prolonged sitting or inactivity.  Improves with rest, heat,  exercise, pacing her activities.  Medications TENS unit and injections.  She reports good relief with current medications provided by this  clinic.  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   REVIEW OF SYSTEMS:  Positive for poor appetite, occasional limb  swelling, denies depression, anxiety or suicidal ideation.   PAST MEDICAL HISTORY:  Unchanged.   SOCIAL HISTORY:  Unchanged.  Continues to smoke 1/2 pack of cigarettes  per day.   FAMILY HISTORY:  Unchanged.   MEDICATIONS:  Provided by this clinic include:  1. Norco 7.5/325 up to 2-4 times per day #75 per month.  2. She also uses Voltaren 75 mg twice a day not more than 20 tablets      per month.  3. Prilosec one p.o. daily.   PHYSICAL EXAMINATION:  VITAL SIGNS:  On exam today her blood pressure is  128/67, pulse 83, respirations 18, 99% saturation on room air.  GENERAL:  She is a well-developed, obese [place] American female who did  not appear in any distress.  She is oriented x3.  Speech is clear.  Affect is bright.  She is alert, cooperative and pleasant.  Follows  commands without difficulty.  MUSCULOSKELETAL:  Transitioning from sitting to standing is done with  ease today.  Her gait in the room is slightly antalgic with decreased  stance phase in the right lower extremity.  Otherwise overall preserved  good mechanics are noted.  Limitations are noted in lumbar motion in all  planes mildly so.  Her reflexes are 2+ and intact at the patellar and  Achilles tendons.  Her motor strength is 5/5 without focal deficit.  There is no atrophy in the lower extremities.  Her tone is normal.  No  clonus is appreciated.  Sensory exam to light touch is also intact in  the lower extremities.  She has tenderness along both trochanters and  down the iliotibial band bilaterally.  She has increased pain with  internal rotation of the right hip with fairly well preserved motion in  both hips.  She has some mild crepitus with flexion/extension of the  knee on the left.  She has full range of motion of both knees.  An  effusion is not appreciated today.  There is no obvious medial, lateral  or AP instability noted in either knee as well.   Radiographs of the right hip were done on Feb 18, 2008, and read by Dr.  Kennith Center, with [facility].  AP and frogleg lateral views of  the right hip showed some hypertrophic spurring around the humeral head  with loss of superior joint space in the hip.  Also some sclerosis  and  spurring in the sacroiliac joint on the right as well.   IMPRESSION:  1. Mild right hip osteoarthritis.  2. Bilateral knee osteoarthritis.  3. Lumbago with history of lumbar spondylotic changes without nerve      root compression.  4. Bilateral trochanteric bursitis, mild iliotibial band syndrome.   PLAN:  At last visit the patient was requested to provide a urine drug  screen sample.  She was unable to provide this sample after waiting at  least 15-20 minutes at which point she stated she had a cell phone call  which was an emergency in nature and she left without providing a  sample.  She has a history of marijuana positive urine drug screen  in  the past and was unable to provide a sample during a random check.  This  was discussed with her today.  At this point would consider continuing  to prescribe something like tramadol or Ultracet as well as her  nonsteroidal anti-inflammatory medication.  However, will not provide  hydrocodone at this time.   She states that she is not in need of any refills on her medications  right now and she would like to be followed back up in months.  She  understands that she will be essentially managed nonnarcotically due to  urine drug screen noncompliance during random check.  We will see her  back in a month.           ______________________________  Brantley Stage, M.D.     DMK/MedQ  D:  02/22/2008 11:25:11  T:  02/22/2008 11:59:55  Job #:  [id number]

## 2011-02-15 NOTE — Assessment & Plan Note (Signed)
HISTORY OF PRESENT ILLNESS:  Bridget [name] is a 50 year old African  American female who was last seen by me on June 06, 2007.  In the  interim, approximately 8 days ago, she developed significant low back  pain and radiating back pain down the left thigh anteriorly while she  was bent over cutting her toenails.  She had her foot propped up on the  commode and developed significant back pain to the above noted areas.  Over the last 8 days now, she has had trouble sleeping.  She has had  difficulty ambulating, and over the weekend she called on-call physician  times two regarding increases in her low back and leg pain.  She was  brought in today early.  Her appointment was scheduled on July 04, 2007, otherwise.   She states her average pain is between 8-10/10 on a scale of 10.  It is  fairly constant, worse with activity.  Waxes and wanes in intensity.  Described as stabbing, tingling, dull, burning and sharp.  Sleep has  been poor for the last week.  Pain is aggravated no matter what position  she is in currently and improves with rest, heat, pacing her activities,  medications, TENS units.  She is reporting good relief with the  medication prescribed by this clinic.   MEDICATIONS:  1. Norco 7.5/325.  She had been taking it 2-3 times per day and      Flexeril 5 mg one p.o. daily.  However, over the weekend, after she      called in Norco, she was given an extra Norco and Flexeril was      increased to 2-3 times per day.  2. Voltaren was added 75 mg one p.o. b.i.d.   PAST MEDICAL HISTORY:  She denies problems controlling bowel or bladder.  She is requiring some assistance with bathing, toileting, meal prep,  household duties.  She has limited amount of minutes she can ambulate  currently.  She is able to drive and climb stairs.   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  She is  not sure she can do it at this point.   No other change in past medical  history.   SOCIAL HISTORY:  Remarkable for a new grandchild born on June 06, 2007.   PHYSICAL EXAMINATION:  VITAL SIGNS:  Blood pressure 125/56, pulse 92,  respirations 18-20, 100% saturation on room air.  GENERAL:  She is a well-developed, obese, African American female who  clearly appears uncomfortable.  NEUROLOGICAL:  She is shifting in her chair.  She is leaning slightly to  the right taking weight off of her left buttock area, standing.  She  stands with weightbearing, mostly on the right side, holding the left  upper extremity in a slightly flexed position while she stands.  She is,  however, oriented.  She is alert.  She is cooperative and pleasant.  Follows commands without difficulty.  EXTREMITIES:  Clearly antalgic gait is noted.  Limitations in lumbar  motion all planes.  She is clearly uncomfortable attempting to move in  any direction at this point.  Seated, her reflexes are intact at  patellar and Achilles tendons.  Straight leg raising does not appear  positive.  She has intact sensation in both lower extremities.  She has  some tenderness over the trochanter on the left.  However, she reports  pain throughout the left buttock into the left thigh regions.  She has  internal/external rotation at the hip.  Denies increase or pain.   PLAN:  Will increase her Norco slightly, 7.5/325 one p.o. q.i.d. p.r.n.  leg pain, #46.  Will see her back in two weeks.  In the meantime, will  obtain a lumbar MRI to rule out upper lumbar disk herniation at possibly  L3-4, and we will give her a prescription for Valium 5 mg one p.o. prior  to MRI with one repeat dose.  Will give her some Ambien 5 mg one p.o.  q.h.s. p.r.n. insomnia #10.  She will continue to use her Topamax going  up to 50 mg b.i.d.  and she is currently on Voltaren since this weekend 75 mg one p.o.  b.i.d.  She has had about three doses at this point.  We will see her  back in two weeks.            ______________________________  Brantley Stage, M.D.     DMK/MedQ  D:  06/25/2007 10:47:56  T:  06/25/2007 14:42:11  Job #:  [id number]

## 2011-02-15 NOTE — Assessment & Plan Note (Signed)
[name] is a 50 year old African American female who is being seen  in our pain and rehabilitative clinic for predominantly low back pain.   She is back in today and reports that overall she has been doing quite  well.  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   Average pain is 6 today, on the average during the month it is about an  8.  Pain is intermittent, sometimes more constant, variable in nature,  sharp, burning, stabbing, dull, tingling or aching.   Interferes significantly with her activities and enjoyment.  Pain is  worse, constant pretty much throughout the day.  Sleep tends to be poor,  pain is worse with walking, bending, sitting, even inactivity, standing.  Improves with rest, heat, medication therapy, pacing her activities and  TENS unit.   She gets good relief with current medications that she is on.   ALLERGIES:  NO KNOWN DRUG ALLERGIES.   MEDICATIONS PROVIDED BY OUR CLINIC:  1. Flexeril 5 mg at night on a p.r.n. basis.  2. Norco 7.5/325 two to up to four times a day #75 per month.  3. Ambien 5 mg up to 10 times per month at night p.r.n. insomnia.  4. Voltaren, she was given 10 a month.  She used 4 tablets in the last      month.   She is off of Topamax now.   On functional and mobility status, is high level overall.  She is able  to climb stairs, she drives, she walks without an assistive device.  She  is able to walk 30 minutes at a time, she is independent with self care  and higher level household activities.   Denies problems controlling bowel or bladder.  Admits to intermittent  numbness, tingling, trouble walking, spasms.  Denies depression, anxiety  or suicidal ideation.   No changes regarding review of systems.   Past medical, social and family history otherwise unchanged as well.   EXAMINATION:  Today blood pressure is 131/61, pulse 70, respirations 18,  97% saturated on room air.  She is  obese [place] American female who  appears her stated age and does not appear in any distress.  She is  oriented x3, her speech is clear, her affect is bright.  She is alert,  cooperative, pleasant and she followed commands without any  difficulties.   She transitions from sitting to standing easily.  Gait in the room is  normal.  Tandem gait, Romberg's test are performed adequately.   Lumbar motion and flexion extension is limited, complains of some  discomfort, especially with extension.   Reflexes are symmetric and intact in the lower extremities.  Motor  strength is 5 over 5, hip flexors, knee extensors, dorsal flexors,  plantar flexors.   Straight leg raise negative.   No abnormal tone and no clonus is noted.   IMPRESSION:  1. Lumbago with history of mild lumbar spondylytic changes without      nerve root compression.  2. History of bilateral trochanteric bursitis intermittently.  3. Bilateral knee osteoarthritis.  4. Right heel pain, currently not a problem.   PLAN:  Refilled all medication for her today, Norco 7.5/325 one b.i.d.  to q.i.d. p.r.n. back pain #75 per month, no refills; Flexeril 5 mg one  p.o. nightly p.r.n. back spasm #30; Ambien 5 mg one p.o. nightly p.r.n.  insomnia #10 per month; she does not need  a refill on her Voltaren,  should she have a flare she may call in and we will refill this for her.  She has been stable on the above medications, no aberrant behavior has  been appreciated.  She is able to maintain a relatively functional  lifestyle and takes her medications as prescribed.           ______________________________  Brantley Stage, M.D.     DMK/MedQ  D:  09/10/2007 09:41:52  T:  09/10/2007 10:43:03  Job #:  [id number]

## 2011-02-18 NOTE — Assessment & Plan Note (Signed)
BIRTH DATE:  [birth year].   Bridget [name] is a 50 year old African-American female who is being seen at  our pain and rehabilitative clinic for multiple pain complaints, including  bilaterally shoulder pain, low back pain, bilateral hip pain, bilateral knee  pain, and numbness and tingling in, predominantly, the left foot.   She is back in today for a refill of her medications and review of her MRI  scan, which was done on July 08, 2006.   She states her average pain in all these areas is about a 9/10.  Her sleep  is fair.  She gets good relief with the current meds that she is on.   The pain is described as variable, sometimes more intermittent, sometimes  more constant.  Sharp, dull, tingling, burning, stabbing, aching in nature.   She continues to work 2 jobs.  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  She also has been cleaning offices.  She  is a fairly high-functioning individual despite her multiple pain complaints  and arthritic conditions.   She denies any new problems with her past medical, social, or family history  since last visit.  No new changes in these areas.   Health and history form are reviewed today as well and it is attached to  chart.   EXAM:  Blood pressure 102/62, pulse 83, respirations 16, 99% saturation on  room air.  She is a mildly obese African-American female who appears her stated age.  She is oriented x3.  Her affect is bright, alert.  She is cooperative and  pleasant.  Does not appear in any distress.   Her speech is clear.  She follows commands without any difficulty.   Transitions from sit to stand without any problems.  Gait is not antalgic in  the room.  She has limitations in forward flexion and extension at end  range, tandem gait, Romberg test is negative.   Seated reflexes are 1+ at the patellar tendons, 0 at the Achilles tendons.  Motor strength is good in the lower extremities.  She has  tenderness over  bilateral trochanters today.   IMPRESSION:  1. Trochanteric bursitis.  2. 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  3. Chronic low back pain.  4. Bilateral knee osteoarthritis.   Review of her MRI scan with her today, discussed hypertrophy as well as  degenerative disk changes, and the mild narrowing of her lateral recesses at  L3 and L4.  A spine model was used to discuss this with her.   Options and alternatives for therapy were reviewed with her to continue what  she is currently taking.  To consider medial branch blocks.  To consider  epidural steroid injections to improve the leg pain.  We also discussed  possible trochanteric bursitis injections.   At this point she would like to just stay on what she is currently taking  and would like a trial of Topamax today.  She states that the ibuprofen has  bothered her stomach intermittently.  She would like to get off that.  We  will trial her on Celebrex over the next month 1 p.o. q. day p.r.n.  We will  start her on Topamax 50 mg 1 p.o. b.i.d.  She currently takes Prilosec and  Flexeril on a p.r.n. basis, and we will go ahead and refill her Norco  725/325 one p.o. b.i.d. #60.  We will see her back in a  month.  Consider  trochanteric bursitis injections at that time.           ______________________________  Brantley Stage, M.D.     DMK/MedQ  D:  08/02/2006 10:28:13  T:  08/02/2006 14:23:02  Job #:  [id number]

## 2011-02-18 NOTE — Assessment & Plan Note (Signed)
Bridget [name] is a 49 year old black female who is accompanied by her 3-year-  old grandson today.  She has been seen in our [facility] Rehabilitative Clinic  for multiple pain complaints including low back pain, intermittent sciatic  symptoms in the right lower extremity, intermittent trochanteric bursitis,  and bilateral knee osteoarthritis.  She is back in today, states she has  been staying quite active.  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  She is able to walk up to 45 minutes to an hour at a time.  She  states her average pain is about a 7 on a scale of 10.  Her pain is variable  in nature, sometimes more intermittent, sometimes more constant, sharp,  dull, stabbing, tingling, aching in nature, depending on which area is  bothering her.  Her sleep is between poor and fair.  The pain is exacerbated  by walking, bending, sitting, and activity, standing, a variety of  activities.  She improves with rest, heat, exercise, pacing her activities,  and medication.  She reports good relief of her pain with her current  medications.   She does have a new complaint today which is left foot pain which has been  bothering her for a couple of weeks now.  She admits to intermittent bladder  control problems, tingling, trouble walking, spasms, anxiety.  Denies  suicidal ideation.  Otherwise, no new changes in her past medical, social,  or family history.  She used to smoke a pack of cigarettes a day.   PHYSICAL EXAMINATION:  Blood pressure 107/59, pulse 70, respirations 18, 99%  saturated on room air today.  She is an obese black female who appears her  stated age, she is oriented x3.  Her affect is bright and alert.  She is  cooperative and pleasant.  She does not appear in any distress during her  interview.  She transitions from sit to stand without difficulty.  She does  appear a bit stiff.  Her gait is slightly antalgic.  She reports pain in  both knees as she walks in the  room today.  She has limitations in lumbar  range of motion, both forward flexion as well as extension.  Her reflexes  are symmetric and intact in the upper and lower extremities.  Her motor  strength is good throughout.  No focal weakness is appreciated in the upper  or lower extremities.  No sensory deficits are appreciated.  She did  complain about some pain and swelling of the left leg.  Measurements were  taken 14 cm below the patella and measured 40 cm bilaterally.  There was no  pitting edema appreciated.  She was, however, quite tender between the first  and second MTP joint on the left consistent with possibly some mild joint  arthritis or neuroma.   IMPRESSION:  1. New left foot pain possibly related to neuroma versus mild OA.  2. Chronic low back pain.  3. Intermittent sciatic symptoms.  4. Bilateral knee osteoarthritis.  5. Intermittent trochanteric bursitis.   PLAN:  Will refill her Norco and Flexeril today.  We will write her a  prescription for a metatarsal pad for her left foot.  We will start her on  some Celebrex 200 mg one p.o. b.i.d. for approximately eight days.  She was  given samples of Celebrex today from our office.   Regarding her bilateral knee OA, she has been followed by orthopedics.  They  have discussed  the possibility of doing a total knee replacement at some  point.  She is not ready for this quite yet.   She continues to stay very active on her medications.  She has not displayed  any aberrant behavior.  We will see her back in a month.          ______________________________  Brantley Stage, M.D.    DMK/MedQ  D:  06/07/2006 12:23:31  T:  06/07/2006 16:38:54  Job #:  [id number]

## 2011-02-18 NOTE — Assessment & Plan Note (Signed)
INTERVAL HISTORY:  Ms. Deringer is a 50 year old African-American woman who  is being seen in our pain and rehabilitative clinic for chronic low back  pain predominantly.   She is back in today, reports she overall has been doing fairly well, her  pain is about a 6 on a scale of 10.  She occasionally has been taking her  Norco inappropriately, taking three at a time rather than the one three  times a day - this is discussed further with her.   She is back in today for basically a refill of her medications.  She has  been using some adjuvants to help treat her back pain including a Ben-Gay  patch.  She also uses ibuprofen 600 mg on occasion once a day.   She reports overall poor sleep and for the most part she reports fair to  good relief with her medications.  She stays fairly active.  She can walk  about 20 minutes at a time, sometimes only 10 minutes.  She is independent  with her self-care, needs some help with meal prep, household duties,  shopping.   REVIEW OF SYSTEMS:  Positive for occasional bladder leakage, weakness,  numbness, tingling, trouble walking, spasms, confusion, depression,  dizziness, anxiety.  Denies suicidal ideation.  Also positive for some  constipation, night sweats, poor appetite.   Reports no changes in past medical, social or family history since our last  visit.   EXAMINATION:  Blood pressure 98/56, pulse 75, respirations 16, 100%  saturated on room air.  She is a well-developed mildly obese African-  American female who appears her stated age.  She is oriented x3.  Her affect  is bright, alert, cooperative.  No pain behaviors displayed this morning.  Her gait is slightly antalgic.  She has some limitations in lumbar range  basically in all planes.  Her reflexes are symmetric and intact in the lower  extremities.  No clonus is noted.  No abnormal motor tone is noted.  She has  good strength in the lower extremities without any focal weakness.   Straight  leg raise is negative.  She still has tenderness over the trochanters  bilaterally.   IMPRESSION:  1.  Depression.  2.  Lumbago.  3.  Degenerative disk disease.  4.  History of cervicalgia.  5.  Patellofemoral joint involvement.  6.  Trochanteric bursitis bilaterally.   PLAN:  We will start on one 600 mg ibuprofen in the morning.  We will switch  her hydrocodone to 7.5 mg one p.o. twice a day.  We will add 500 mg Extra-  Strength Tylenol later on in the day as well.  I encourage her to continue  using the Ben-Gay patch; she finds Lidoderm not as helpful; may trial  BioFreeze next month.  Have encouraged her to continue her physical therapy  program for iliotibial band stretching.  She has been stable on these  medications.  We will see her back in 6 weeks.           ______________________________  Brantley Stage, M.D.     DMK/MedQ  D:  09/14/2005 13:12:09  T:  09/15/2005 10:09:12  Job #:  [id number]

## 2011-02-18 NOTE — Assessment & Plan Note (Signed)
HISTORY OF PRESENT ILLNESS:  [name] is a 50 year old single, African  American woman who is being seen in our [facility] for chronic  complaints of bilateral cervical and shoulder pain.  Her chief complaint is  low back pain with radiating right lower extremity pain.   She is known to have a disk bulge at L4-5 which may be contacting the right  L5 root.   She is back in today.  She has taken her Norco inappropriately.  She was  taking three at a time.  This was reviewed with her, and she discussed at  length the reasons for not taking Norco inappropriately including the amount  of acetaminophen intake she was getting.   She reports her average pain about an 8 on a scale of 10 today.  She reports  she is doing fairly well, not having a lot of trouble today with respect to  her pain.  With her Neurontin, she is sleeping well at night.  She describes  her pain as fairly constant.  Nature changes variably.  Sometimes it is  sharp, dull, tingling, burning and stabbing.  Pain is worse with activities.  In general, improves with rest and medication.   She gets fairly good relief with her current medications at this time.   She can walk 10-20 minutes at a time.  She is able to climb stairs.  She  drives.  She is a rather high functioning woman.  She takes care of her  children.  Is independent with most of her self cares.  She reports  occasionally needs assistance with dressing, following meal prep, household  duties and shopping.  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   She does admit to intermittent bladder control problems, weakness, numbness,  tingling, spasms, confusion, depression.  Denies suicidal ideation.  Also,  admits to constipation, poor appetite and shortness of breath.   Family history, social history, medical history unchanged since last visit.   PHYSICAL EXAMINATION:  VITAL SIGNS:  Blood pressure 106/42, pulse 70,  respirations 16, 99% saturation on room air.  GENERAL APPEARANCE:  She is an obese [place] American female who does not  appear in any distress.  She is oriented x3.  Her affect is bright, alert,  cooperative and slightly irritable.   Her gait is normal.  She stands easily after being seated.  No antalgia's  noted to date.  She has mild limitations and lumbar range.  Seated straight  leg raising is negative.  Reflexes are symmetrical and intact at patellar  tendons and Achilles tendons.  No abnormal tone is noted.  Plantar response  is downward.  Motor strength is 5/5 at hip flexors, knee extensors,  dorsiflexors.  There is no side to side differences in EHL today.   IMPRESSION:  1.  L4-5 disk bulge with possible impingement of right L5 root.  2.  Right lower extremity neuropathic-type pain intermittently.  3.  Degenerative disk disease L3-4, L4-5.  4.  Rheumatalgia.  5.  Patella femoral joint involvement OA.  6.  Trochanteric bursitis much improved.   PLAN:  Will refill the following medications for her Norco 5/325 one p.o.  t.i.d., #90, again, I reiterate the importance of taking this as prescribed  and not more than prescribed.  She understands and will comply.  She was  also given a prescription for ibuprofen 600 mg one p.o. daily in the a.m.  Will also  have her follow up with Dr.  Newell Coral per her request, and I will see her back in one month.  She has  completed physical therapy program for her hands, trochanteric bursitis and  cervical spine.  Reports overall improvement with that.           ______________________________  Brantley Stage, M.D.     DMK/MedQ  D:  06/20/2005 10:11:46  T:  06/20/2005 11:49:25  Job #:  [id number]

## 2011-02-18 NOTE — Procedures (Signed)
NAME:  Bridget [name], Bridget [name] NO.:  [id number]   MEDICAL RECORD NO.:  [id number]          PATIENT TYPE:  REC   LOCATION:  [facility]                          FACILITY:  [facility]   PHYSICIAN:  Erick Colace, M.D.DATE OF BIRTH:  [birth year]   DATE OF PROCEDURE:  DATE OF DISCHARGE:                               OPERATIVE REPORT   PROCEDURE:  L5-S1 translaminar epidural steroid injection under  fluoroscopic guidance.   ATTENDING:  Erick Colace, M.D.   INDICATION:  Lumbar pain, MRI findings showing a broad-based central  disk at L3-4 with lateral recess narrowing bilaterally at that level and  at L5-S1, similar findings.   INFORMED CONSENT:  Informed consent was obtained after describing the  risks and benefits of the procedure to the patient.  These include  bleeding, bruising, infection, loss of bowel and bladder function,  temporary or permanent paralysis; she elects to procedure.   DESCRIPTION OF PROCEDURE:  The patient was placed prone on fluoroscopy  table with Betadine prep and sterile drape.  A 25-gauge inch-and-a-half  needle was used to anesthetize skin and subcu tissue with 1% lidocaine  x2 mL.  Then an 18-gauge 8-cm Touhy needle inserted.  On AP and lateral  imaging, it became apparent that it would not reach the epidural space  with that needle, so it was switched out for an 18-gauge 6-inch Touhy  needle, targeting inferior aspect of the lamina.  At lamina of L5, the  needle was then redirected inferiorly and entered the epidural space  with loss-of-resistance technique.  Omnipaque 180 for 1 mL demonstrated  no intravascular uptake and good epidural spread.  This was followed by  injection of 2 mL of 40 mg/mL Depo-Medrol and 2 mL of 1% MPF lidocaine.  The patient tolerated the procedure well.  Preinjection pain 7/10, post-  injection pain 0/10.      Erick Colace, M.D.  Electronically Signed     AEK/MEDQ  D:  12/26/2006 10:29:15  T:   12/26/2006 10:56:02  Job:  [id number]   cc:   Brantley Stage, M.D.  Fax: [phone]

## 2011-02-18 NOTE — Group Therapy Note (Signed)
MEDICAL RECORD NUMBER:  [id number].   Bridget [name] is a 50 year old single black female who is referred by Dr.  Larina Bras for referral for bilateral shoulder pain.   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 She eventually underwent right shoulder surgery on October 23, 2000  by Dr. Despina Hick and then subsequently underwent left shoulder surgery in 2004  by Dr. Jerl Santos. Her initial injury was ringing mops, and the left shoulder  injury occurred while changing a ______________. She has had several  injections into the right shoulder, and she is not really sure if she has  had any into the left shoulder.   Her function is not significantly limited by her shoulder range at this  point. She is able to take care of all of her self-care. She does require  sometimes some assistance with meal prep, handling heavier kettles, and some  household duties and shopping. She is not employed and last worked back in  April 24, 2003.   She also has a secondary problem which may even be a bigger problem for her  than she was referred for and that is left knee pain. Apparently, over the  last couple of days, she decided to start an exercise program, and she began  by walking one hour for the last two days and subsequently has had a flare  up of some knee pain that she has had before. She does not really note any  locking or clicking or popping. She does not have any swelling. She  describes fairly significant achiness mainly in the left knee and worsened  by doing any kind of bending type activities, stairs, etc. Overall, her pain  score, she self-rates at a 2 on a scale of 10. Pain moderately interferes  with activity. Her pain is worse with various activities as described above.  Improves with rest, heat, ice, pacing herself, medications, TENS unit.  Currently, she has been taking Flexeril 10 mg 3  times a day, hydrocodone  5/500 for a total of 2 pills a day, diclofenac she has a prescription for  but is not taking-she is on 75 mg b.i.d.-she has not taking this however,  and on Prozac 40 mg q.d.   She reports good relief with these medications at this time. She admits to  some numbness and tingling which are not terribly problematic in both hands  and also admits to anxiety and depression. Denies any suicidal ideation or  thought and denies any problems controlling bowel or bladder.   REVIEW OF SYSTEMS:  Noted in health and history form, and she will discuss  some of these problems with her primary care physician if they are  bothersome.   Past surgical history is noted as above. Denies any problems major medical  problems at this time including diabetes, ulcers, cancer, kidney problems,  thyroid problems, or high blood pressure.   She is single. She lives with some grandchildren and her children. She does  admit to illegal drug use and tells me she has smoked marijuana recently.  She smokes 1-1/2 to 2 packs of cigarettes a day.   Her family history is remarkable for heart disease, high blood pressure,  psychiatric problems, and drug abuse.   PHYSICAL EXAMINATION:  Exam today reveals an obese black female who is no  apparent distress. She is oriented x3. Her affect is quite bright and alert  and talkative and pleasant. She is able to stand from her seated position.  Her gait is somewhat antalgic. She has some difficulty getting off the stool  provided in the room because of her knee pain. She has a great range of  motion in her lumbar spine, is almost able to reach her toes. She has no  pain in the lumbar spine with bending. She has near full range of motion in  her left shoulder. She is able to get her hand behind her head, and she has  about 80 degrees of internal and external rotation on the left and  approximately 120 degrees of abduction and forward flexion with the  left  shoulder. With the right shoulder, it is limited. She has about 45 degrees  of internal and external rotation and is limited to about 100 degrees of  abduction and forward flexion.   Her reflexes overall are symmetric and intact. She denies any numb areas  with light touch. Her motor strength is quite good throughout, 5/5 in upper  and lower extremities. Knee exam does not reveal any swelling in either  knee. She has crepitus prominent bilaterally with flexion/extension over the  patella. She has predominantly medial joint line tenderness on the right,  none on the lateral joint line, and the right knee is nonpainful with  palpation.   IMPRESSION:  1.  Bilateral knee pain, probable patellofemoral joint osteoarthritis and      some medial joint line tenderness on the right. Also consistent with      osteoarthritis of her left knee.  2.  Status post bilateral shoulder surgery with chronic shoulder pain.   Overall pain scores are not bad. She is a self-rated 2 on a scale of 10. She  is quite happy with her current pain management. She does admit to substance  abuse with marijuana, and we anticipate a positive UDS. Given that it is  positive, we will not prescribing narcotics for her. She is understands this  and is willing to try adjuvant pain management medications and techniques.  We will also give her a prescription today for Lidoderm 5% 1 to 3 patches 12  hours on 12 hours off #90 as well as the Prilosec 20 mg q.d. #30. I would  like her to start on her diclofenac again 75 mg 1 p.o. b.i.d. for at least  10 days regularly. I would like her to decrease her walking at this point  from 60 minutes down to 10 minutes this week, and if her knee pain has  improved, we will let her continue the walking program. If she continues to  have problems, would certainly recommend a pool program until we can get her  knees calmed back down again regarding her pain there. We will see her back  in  a month.     DMK/MedQ  D:  12/24/2004 12:29:30  T:  12/25/2004 16:10:96  Job #:  [id number]   cc:   Ace Gins, MD  Fax: [phone]

## 2011-02-18 NOTE — Assessment & Plan Note (Signed)
Wednesday, August 30, 2006:   Bridget [name] is a 50 year old black female who is being seen in our pain  and rehabilitation clinic today for a recheck and refill of her medications,  and a review of her MRI scan which was done 07/08/06.   She had had several months of complaints of left leg numbness and tingling  and pain. MRI scan was read by Dr. Purcell Mouton, compared with prior studies of  05/19/05 and 01/30/06.   Basically reported stable examination of the lumbar spine, broad-based  central disc protrusion L3-4 with mild central and lateral recess stenosis  bilaterally, stable right paracentral disc protrusion L4-5, again with mild  central and lateral recess stenosis bilaterally, mild facet arthropathy  noted as well. The results of this were reviewed with her today using a sign  model.   Questions regarding the scan were also answered for her.   She reports pain predominantly in the low back and bilateral knees today,  some intermittent numbness noted in the left leg.   She reports her pain as variable in nature, sometimes more sharp, stabbing  or aching. Average pain is about a 9 on a scale of 10. Currently in the  office it is about a 7. She states she is having a good day.   Her sleep is fair. She reports good relief with the current meds that she is  on. Pain is typically worse with activities and improves with rest, pacing  her activities and medication.   She is able to walk about 30 minutes at a time. She is able to climb stairs  and drive. She is a high-level functioning individual. 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   Reports no changes in her review of systems. No new problems. Past medical,  social, family history are unchanged, other than recent death of her mother  earlier this month, she was 71. Apparently she had multiple medical  problems.   CURRENT MEDICATIONS:  From this clinic include: Norco 7.5/325 b.i.d.;  Flexeril 5  mg b.i.d.; Prilosec 20 mg q. day; Celebrex 200 mg q. day, up to  20 tablets per month; and Topamax 50 mg one p.o. b.i.d.   She states that she has stopped taking her Topamax earlier this month. She  does not really give a reason, she believes that with the death of her  mother she simply forgot to continue to take it.   She states she would like to go back on the Topamax this month again,  however.   EXAMINATION:  Her blood pressure is 104/46, pulse 65, respirations 16, 99%  saturated on room air. She is a mildly obese black female who appears her  stated age. She does not appear in any distress. She is oriented x3. Her  speech is clear. Her affect is bright, alert, cooperative and pleasant.   Transitions from sit to stand easily. Gait in the room is normal, tandem  gait is normal. Romberg's test is negative. She has limitations in lumbar  motion, basically in all planes, and complains of pain, especially with  extension. Seated reflexes are symmetric and intact in the lower  extremities. Good muscle bulk is noted. Normal tone is noted. Motor strength  is in the 5/5 range. She does report a sensory deficit over the left L5  dermatome.   IMPRESSION:  1. Left intermittent neuropathic leg pain.  2. Chronic low back pain.  3. Bilateral knee  osteoarthritis.  4. Intermittent trochanteric bursitis.  5. New problem with medial elbow region. We will discuss more at next      visit.   PLAN:  I will refill the following medications for her: Celebrex 200 mg one  p.o. q. day, #20; Norco 7.5/325 one p.o. b.i.d., #60, no refills. She  continues to take Flexeril on a p.r.n. basis and will restart her Topamax to  50 mg p.o. q.h.s. for three days, then b.i.d. We will have nursing staff see  her back in a month. I will see her back in two months.           ______________________________  Brantley Stage, M.D.     DMK/MedQ  D:  08/30/2006 09:51:14  T:  08/30/2006 12:46:32  Job #:   [id number]

## 2011-02-18 NOTE — Assessment & Plan Note (Signed)
Patient follows up today.  She has undergone L5-S1 transforaminal  epidural steroid injection under fluoroscopic guidance 12/26/2006.  Preinjection pain level 7 out of 10, postinjection is 0 out of 10;  however, this only lasted a few days.  She had no post fevers.  She has  had no change in her lower extremity strength, or bowel or bladder  problems.   She is accompanied by her young son today.   Current med's include hydrocodone 7.5/325; she has 75 tablets per month;  she takes it b.i.d. or t.i.d.  She continues taking Flexeril as well as  Topamax 50 b.i.d.  She is no longer taking the Celebrex, and therefore  no longer takes the Prilosec.  She has used Lidoderm patches in the  past, has not had a recent prescription for this, but is interested in  starting back on this.   Her pain currently is 8 out of 10, averaging 8 out of 10.  Sleep is  poor.  She can walk 20 minutes at a time.  She climbs steps.  She  drives.  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  Her lower extremities have numbness and tingling, spasms.  She has poor  appetite and night sweats.   SOCIAL:  Single, has children.  She smokes half a pack a day.   PHYSICAL EXAMINATION:  Blood pressure 111/61, pulse 75, respiratory rate  16.  O2 sat 98% on room air.  General, in no acute distress, mood and  affect appropriate.  Her gait is normal.  She is morbidly obese.   Lower extremity strength is full.  Range of motion is full.  She has  some tenderness over the greater trochanters bilaterally.  She has mild  tenderness to palpation in lumbosacral paraspinal's.   IMPRESSION:  1. Lumbar pain, with short term relief from epidural steroid      injection.  2. Trochanteric bursitis.  I discussed the possibility of injection      with her; she does not want to pursue this; she states she hates      needles.  3. Neurogenic pain in the  lower extremities, intermittent.  Continue      Topamax 50 b.i.d.  4. We will continue her Norco 7.5/325, #75 per month, and restart her      on Lidoderm.  She will no longer take her Celebrex or Prilosec.  5. Dr. Pamelia Hoit will see her next month.      Bridget [name], M.D.  Electronically Signed     AEK/MedQ  D:  01/08/2007 13:42:16  T:  01/08/2007 14:42:38  Job #:  [id number]   cc:   Brantley Stage, M.D.  Fax: [phone]

## 2011-02-18 NOTE — Assessment & Plan Note (Signed)
Ms. Rahn is a 50 year old, single, black female who is being seen in our  pain and rehabilitative clinic for multiple pain complaints including  cervical pain, bilateral elbow pain, lumbago, right foot pain.  Most  recently she had an episode of increased pain in the right buttock laterally  down the right leg to the foot.  This started about two weeks ago with she  relates it to physical therapy, however, over the last week and a half she  has noted a slight improvement.  She also recalls some bladder problems  which have been with her for about six months.  She is seeing a urologist  for that.   Her average pain in the low back and legs is about an 8 on a scale of 10.  Her pain is described as intermittent, constant, dull, aching, stabbing,  burning, and sharp.  The pain is exacerbated with most activities.  Her pain  improves with rest, heat, ice, therapy, pacing her activities, medications,  TENS unit, injections.  She gets fairly good relief with her current  medications at this time.   Sleep is poor.   MEDICATIONS:  1.  Diclofenac 75 mg one p.o. b.i.d.  2.  Tramadol 1-2 tablets p.o. every day.  3.  Flexeril one tablet p.o. b.i.d.   FUNCTIONAL STATUS:  Ms. Mathison is able to walk about 30 minutes at a time.  She climbs stairs.  She is able to drive.  She is independent with all of  her self care.  Notes that she might need some assistance with meal prep,  household duties, shopping.   REVIEW OF SYSTEMS:  Positive for bladder control problems, weakness,  numbness, tremors, tingling, trouble walking, spasms, dizziness, confusion,  depression.  Also reports problems with constipation, poor appetite,  coughing, shortness of breath, and recent weight loss which she has  intended.  She reports an approximately 20 pound weight loss over the last  several months.   Past medical, social, family history are unchanged since last visit.   PHYSICAL EXAMINATION:  VITAL SIGNS:   Blood pressure 111/56, pulse 88,  respirations 16, 100% saturated on room air.  GENERAL:  She is mildly obese, well developed, black female who appears her  stated age.  She is oriented x 3.  Affect is bright, alert, cooperative, and  pleasant.  MUSCULOSKELETAL:  She is able to stand independently after being seated.  Her gait is initially somewhat stiff.  She has some obvious stiffness trying  to straighten out after being seated.  Her gait is not antalgic but appears  to be somewhat slow.  It is stable enough, not particularly wide based, and  the stride length is normal.  Balance is good.  Seated, reflexes are 2+ at  the knees and ankles.  Babinski is downgoing bilaterally.  She is able to  walk on heels and toes without difficulty today.  Motor strength in the  lower extremities is excellent 5/5 at hip flexors, knee extensors,  dorsiflexors, plantar flexors, EHL.  Straight leg raise mildly positive on  the right.  Further examination of the right hip reveals some tenderness  over the right trochanter.   IMPRESSION:  1.  Lumbago with new right sciatic type symptoms and history of bladder      control problems.  2.  Cervicalgia.  3.  Mild metacarpal arthritis, right hand.  4.  Bilateral patellofemoral joint involvement.  5.  Right trochanteric bursitis.   PLAN:  1.  We  will obtain a lumbar MRI to rule out central disk herniation, given      her urinary symptoms.  Also, appears to be a recent problem with      increased low back pain and right leg pain.  We will check MRI and see      her back in a month.  2.  We will give her a prescription for Valium 5 mg one p.o. prior to MRI,      may repeat x 1, two tablets.  3.  We will see her back in two weeks.           ______________________________  Brantley Stage, M.D.     DMK/MedQ  D:  05/18/2005 12:45:06  T:  05/18/2005 14:28:09  Job #:  [id number]

## 2011-02-18 NOTE — Assessment & Plan Note (Signed)
Bridget [name] is a 50 year old African-American female who has a history  of lumbago and lumbar spondylosis with intermittent lower extremity  pain.  She also has a history of bilateral knee osteoarthritis and  trochanteric bursitis.   Bridget [name] is back in today and is extremely frustrated by her pain.  Her right leg is bothering her quite a bit especially in the foot and in  the heel when she walks or stands.  She gets more heel pain and she is  also having some numbness into the lateral toes of the right foot.   Her average pain is about an 8 to 7 on a scale of 10.  Pain is variable,  sometimes it is more intermittent or more constant, sharp, dull,  stabbing, tingling, and aching in nature.  She gets good relief with the  medicines that she is on.  Pain also improves for her with rest, heat,  pacing her activity, medication, and TENS unit.   She is able to walk about 15 minutes at a time.  She is able to climb  stairs and drive.  She is independent with her self care.  She is a high  functioning individual.  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  Denies depression, anxiety, or suicidal ideation.  No changes in past medical, social, or family history since our last  visit.  She does smoke about half pack of cigarettes a day and is  cautioned against this.   MEDICATIONS:  Medications provided by this clinic include the following:  1. Norco 7.5/325 two to three times a day.  2. Flexeril 5 mg up to twice a day.  3. Prilosec 20 mg daily.  4. Celebrex 200 mg daily.  5. Topamax 50 mg twice a day.   PHYSICAL EXAMINATION:  VITAL SIGNS:  Blood pressure 118/67, pulse 65,  respirations 16, 99% saturated on room air.  GENERAL:  She is an obese African-American female who appears her stated  age.  She is oriented x3.  Her affect is bright and alert.  She is  cooperative and pleasant.  She  does get tearful when she is discussing  her function.  However, she is quite frustrated by her pain and her loss  of function. She is able to transition from sit to stand independently,  does not display any pain behaviors.  Her gait is normal in the room.  She has limitations in lumbar motion.  Reflexes are symmetric and intact  in the lower extremities.  No abnormal tone is noted.  Her motor  strength is good with the exception of the right EHL is slightly weaker  than the left and she has some numbness with pinprick and light touch  noted over the dorsal and lateral foot of the right lower extremity.   IMPRESSION:  1. Lumbago with more persistent right lower extremity pain with some      right EHL weakness and numbness in the dorsolateral foot.  2. Bilateral knee osteoarthritis.  3. Intermittent trochanteric bursitis.   PLAN:  We will refill her Norco 7.5/325 two to three times a day #75  with no refills.  We will also get her set up for an epidural injection  with Erick Colace, M.D.  She is also taking Flexeril, Prilosec,  Celebrex, and Topamax and does not need refills on these today.  I will  see her back in a month.           ______________________________  Brantley Stage, M.D.     DMK/MedQ  D:  12/13/2006 16:38:06  T:  12/15/2006 11:00:14  Job #:  [id number]

## 2011-02-18 NOTE — Assessment & Plan Note (Signed)
SUBJECTIVE:  Bridget [name] is a 50 year old black female who is being seen in  our pain and rehabilitative clinic for multiple pain complaints including  lumbago, intermittent sciatic symptoms in the right lower extremity,  trochanteric bursitis, right knee osteoarthritis.   She is back in today, states her average pain is about a 5 on a scale of 10.  Her activity level overall has improved in the last month.  She is holding  down two jobs now.  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  She states her pain is fairly constant; no particular time of day  is worse.  The pain is localized in the cervical region, lumbar region,  bilateral trochanters and lower extremities, predominantly in the calves and  lateral lower leg.  The pain is described as variable in its' nature,  sometimes more constant, sometimes more intermittent, sharp, burning, dull,  stabbing, tingling, aching in its nature.   She reports occasional spasms and tingling in the lower extremities.   PAST MEDICAL HISTORY:  No new changes in past medical, social or family  history other than the addition of her second job now.   PHYSICAL EXAMINATION:  VITAL SIGNS:  Her blood pressure is 124/64 today.  Pulse is 67.  Respirations 16.  99% saturation on room air.  GENERAL APPEARANCE:  She is a well-developed, well-nourished black female  who appears her stated age and is oriented x3.  Her affect is bright. She is  alert, cooperative and pleasant.  MUSCULOSKELETAL:  Patient transitions from sit to stand without difficulty.  Gait in the room is non-antalgic.  Balance is good.  Romberg and tandem gait  are within normal limits.  Reflexes are symmetric and intact in upper and  lower extremities.  Motor strength is good throughout.  No focal weakness.  No new sensory changes are appreciated.   IMPRESSION:  1.  Chronic low back pain.  2.   Intermittent sciatic symptoms.  3.  Right knee osteoarthritis.  4.  Trochanteric bursitis, intermittent.   DISCUSSION:  The patient overall has been quite functional.  Pain scores are  overall improved.   PLAN:  Will refill the following medications for her:  1.  Norco 7.5/325 one p.o. b.i.d. #60.  2.  She does not need Flexeril, she has one refill left, she continues to      take 5 mg up to twice a day.  3.  She takes ibuprofen 600 mg twice a day.  4.  She does not take Lyrica anymore at this point.  5.  She occasionally will take a Prilosec as well.   Patient continues to stay very active and is stable on these medications.  Will see her back in one month.           ______________________________  Bridget [name], M.D.     DMK/MedQ  D:  05/10/2006 15:08:11  T:  05/10/2006 15:24:07  Job #:  [id number]

## 2011-02-18 NOTE — Assessment & Plan Note (Signed)
Bridget [name] is a 50 year old single African-American woman who is being  seen in our Pain and Rehab Clinic for chronic complaints of bilateral  cervical and shoulder pain.  She has multiple other complaints including low  back pain, right lateral hip pain, bilateral knee pain, and elbow pain.   She is back in today and reports her average pain has been about a 10 on a  scale of 10, but today is about a 7 on a scale of 10.  Her pain is variable  in its nature, sometimes it is intermittent and sometimes constant,  described as sharp, burning, dull, stabbing, tingling, and aching.  She  reports poor sleep.  She reports good relief with current medicines.  The  pain is typically worsened with walking, bending, sitting, and activity and  standing.  Improves with rest, heat, ice, medications, and TENS unit.   FUNCTIONAL STATUS:  The patient is able to walk about 20 minutes at a time.  She is able to climb stairs.  She drives.  She has been working.  She was  working at [employer] show about 7 hours a day for 4-5 days.  Also does  some office cleaning in the afternoons for a total of about 10 hours a week  on the average.   She is independent with all of her self-care as well.  Denies suicidal  ideation.   PAST MEDICAL HISTORY:   SOCIAL HISTORY:   FAMILY HISTORY:  Unchanged since last visit.   PHYSICAL EXAMINATION:  VITAL SIGNS:  Blood pressure 99/48, pulse 66,  respirations 16, 100% saturated on room air.  GENERAL:  She is a well-developed, well-nourished black female.  She does  not appear in any distress today.  She is oriented.  Affect is bright,  alert, and cooperative.  She has several books that she has brought into the  office today and is reading. She is able to stand without difficulty.  Gait  is not antalgic.  She has limitations in lumbar range of motion in all  planes.  She reports increased pain in the right lumbar paraspinal  musculature.  Also some tenderness is  noted along the right iliotibial band  and trochanter area.   Seated reflexes are 2+ at the knees, 1+ at the Achilles tendons.  Straight  leg raise is negative.  Motor strength is nonfocal.   IMPRESSION:  1.  L4-5 disk bulge with possible impingement right L5 root.  No active      radicular symptoms at this time.  2.  Intermittent right lower extremity neuropathic type pain.  3.  Degenerative disk disease, L3-4 and L4-5.  4.  Cervicalgia.  5.  Patellofemoral joint involvement, most likely OA.  6.  Trochanter bursitis mild at this time.   PLAN:  We will refill the following medications for her today:   1.  Ibuprofen 600 mg one p.o. q.a.m. #30 with two refills.  2.  Norco 5/325 one p.o. t.i.d. #90.  3.  She also takes Neurontin three times a day.   She has no side effects from these current medicines other than the  Neurontin makes her sleepy sometimes during the day.  She is requesting  rheumatologic consults.  We will have her follow up with primary care and  have them  refer her if they feel necessary.  We will see her back in 1 month.  She has  been stable on these medications and has not displayed any aberrant  behavior.  Continues to stay functional including working up to 10 hours a  week.           ______________________________  Brantley Stage, M.D.     DMK/MedQ  D:  07/18/2005 12:24:49  T:  07/18/2005 13:13:59  Job #:  [id number]

## 2011-02-18 NOTE — Assessment & Plan Note (Signed)
MEDICAL RECORD NUMBER:  [id number].   Bridget [name] is a 50 year old patient referred by Dr. Larina Bras. She is back to  her pain and rehabilitative clinic for a refill of her medications recheck.   She had a positive urine drug screen for marijuana at the last visit. We are  reluctant to prescribe narcotics for this woman at this time.   She has been using Lidoderm, recently started on Ultram yesterday,  diclofenac, and Flexeril as well as Prilosec.   She reports her pain relief with the medications is good. Average pain is  about a 6 on a scale of 10. She describes her pain especially in her knees  and shoulders is fairly constant, sharp, burning, dull, stabbing, tingling  and aching. Sleep has been poor lately.   She admits to some depression and frustration over her loss of function. Her  knees have been bothering her a bit more in the last more or so. She has had  quite a bit of family problems with her mother in the hospital.   She requires some assistance with toileting, meal prep, household duties,  and shopping. She admits to some intermittent bladder control and bowel  control problems, spasms, depression. Also admits to weight gain.   PAST MEDICAL HISTORY:  Unchanged since last visit.   SOCIAL HISTORY:  Some changes with the mother in the hospital.   FAMILY HISTORY:  Unchanged since last visit.   PHYSICAL EXAMINATION:  Blood pressure 99/68, pulse 69, respirations 16, 99%  saturated on room air. She is an obese, black female. Does not appear in any  distress during our interview; however, she does have a tearful episode and  cries when discussing her inability to do the things she used to do.   She is able to stand without any difficulty after being seated. Her gait in  the room is normal. Seated reflexes are symmetric, intact in the lower  extremities. She has some crepitus bilaterally, especially worse on the left  than on the right with flexion and extension of her knee. I  do not  appreciate any AP or medial lateral instability. Upon examination of the  knee, I do not appreciate any fluid on the knee. She does admit that there  is some intermittent locking of her knee, however.   IMPRESSION:  Bilateral knee osteoarthritis, probably early on. Would like to  have her followup with orthopedics to rule out any other internal pathology.  Will treat her depression. Would like to start her on Effexor. We discussed  a couple of the medications including Cymbalta, possibly Paxil. 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   She indicates she would like to go back to work at some point. May consider  a vocational rehab referral. She also indicates Lidoderm was not  particularly helpful, and she is also taking Prozac on a very p.r.n. basis  very intermittently. At this point, we will probably discontinue it and  trial her on Effexor this month. We will give her a  month of samples and see how she feels on it. We will check her at next  visit within one month. She just had her Ultram refilled. She will continue  diclofenac as well as Prilosec and the Flexeril.      DMK/MedQ  D:  01/27/2005 13:00:48  T:  01/27/2005 14:30:13  Job #:  [id number]

## 2011-02-18 NOTE — Assessment & Plan Note (Signed)
HISTORY OF PRESENT ILLNESS:  1.  [name] is a 50 year old single, African-American woman who is      being seen in our pain and rehabilitative clinic for multiple pain      complaints including bilateral posterior cervical and scapular pain,      bilateral low back pain, and right leg pain, bilateral anterior leg      pain. She is back in and reports she needs a refill of her medication.      She is being treated with lumbago with new right sciatic type of      symptoms. MRI was obtained May 19, 2005. This is reviewed with her      today.   1.  Cervicalgia. She has also had some right hand complaints and bilateral      patella femoral joint pain and right trochanteric bursitis. The average      pain is about a 6 on a scale of 10. Sleep is fair. She gets good relief      with current medications. Her pain is described as intermittent.      Sometimes more constant, dull, sharp, tingling, aching, and stabbing in      nature. Relieved with medications is actually quite good. She is able to      walk a variable amount of time. Some days more than other. Able to climb      stairs. She does drive. 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 She report that she needs some assistance with dressing,      bathing, toileting, meal prep, household duties and shopping. Admits to      some bowel and bladder control problems, numbness, tingling, trouble      walking, spasms, dizziness, and depression. Also admits to constipation      and poor appetite.   PAST MEDICAL HISTORY/SOCIAL HISTORY/FAMILY HISTORY:  No new changes since  last visit.   PHYSICAL EXAMINATION:  VITAL SIGNS:  Blood pressure 100/62, pulse 84,  respiratory rate 16, saturation 99% on room air.  GENERAL:  She is mildly obese, African-American woman. Does not appear in  any distress. She is oriented x3. Affect is bright, alert, and cooperative.  Occasionally a bit tearful.  NEUROLOGIC:  She is able to stand  without difficulty. Some limitations in  lumbar range of motion are noted. She has good strength in lower  extremities, 5 over 5. Hip flexors, knee extensors, dorsiflexors, plantar  flexors, EHL, straight leg raise negative. Has 2+ patella tendon reflexes  are noted, 1+ Achilles tendon reflexes are noted. No abnormal tone is noted.   IMPRESSION:  1.  At L4-5, MRI shows disk bulge with annular tear, ventral thecal sac      indented, descending right L5 root contacts the disk.  2.  Degenerative disk disease L3-4, L4-5.  3.  Cervicalgia.  4.  Patella femoral joint involvement.  5.  Right trochanteric bursitis.   PLAN:  Would like to get her to followup with local urologist for her  bladder issues. She would like to followup with a neurosurgeon. She does not  know the name of the neurosurgeon but she will let me know. At this point,  we are recommending an epidural steroid injection but she would like to  followup with neurosurgery prior to this option. We will refill the  following medications for her.   MEDICATIONS:  1.  Neurontin 5/325 1 p.o. t.i.d. p.r.n. #90.  2.  Will add Neurontin 300 mg 1 p.o. q.h.s. #30.   FOLLOW UP:  Will see her back in a month. She will call in with the  neurosurgeon that she would like to be referred to.           ______________________________  Brantley Stage, M.D.     DMK/MedQ  D:  06/01/2005 16:11:41  T:  06/01/2005 23:57:36  Job #:  [id number]

## 2011-02-18 NOTE — Assessment & Plan Note (Signed)
Bridget [name] is a 50 year old female who has returned to our pain and  rehabilitative clinic today for recheck and refill of her medications.   She is being seen in our clinic for chronic low back pain, intermittent  sciatic symptoms on the right, bilateral knee osteoarthritis, intermittent  trochanteric bursitis.   She has remained relatively functional on medications provided by this  clinic.  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   She states that over the last several weeks, her left knee has been  bothering her more and more, mostly in the medial aspect.  It feels like it  gives way and occasionally pops and locks on her.  She also has some  numbness over the anterior source of the left foot.   Her average pain is about 9 on a scale of 10.  It is described as variable  in its nature, sometimes more sharp or dull or stabbing, sometimes tingling,  aching, intermittent, and occasionally more constant.  Sleep is fair.  Various activities influence her pain.  She gets good relief with the  current medications that she is on.  She is up walking at least 45 minutes  at a time, is able to climb stairs and drive.  Continues to work two jobs.  Is independent with her self care, and needs a little bit of assistance with  higher-level activities at home.  Denies depression or suicidal ideation.  Admits to some anxiety, numbness, tingling, spasms, poor appetite.   No changes in past medical, social, or family history since last visit.   PHYSICAL EXAMINATION:  VITAL SIGNS:  Blood pressure 119/73, pulse 68,  respirations 16, 100% saturated on room air.  GENERAL:  She is a mildly obese black female who appears her stated age.  She is oriented x3.  Her affect is bright, alert.  She is cooperative,  pleasant, appropriate.  MUSCULOSKELETAL:  Transitions from sit to stand.  Appears a little bit stiff  when she gets up.  Her gait is stable, however.  Tandem  gait and Romberg's  test are normal.  She has limitations in lumbar range of motion.  Seated,  reflexes are symmetric in the upper and lower extremities.  Motor strength  is good throughout both upper and lower extremities.  Reports diminished  sensation throughout the left leg, especially in the left anterior thigh  region, somewhat into the foot.  Motor strength, however, is good in both  lower extremities.  She has tenderness to palpation along the left knee  medial joint line.  No effusion is appreciated.  There does not appear to be  any AP or lateral instability.  I do not appreciate any crepitus.  Fluid  flexion and extension, and she has essentially normal range of motion in the  left knee.   IMPRESSION:  1. Left neuropathic leg pain, most likely related to spondylosis, possible      disc involvement.  2. Chronic low back pain.  3. Intermittent right sciatic symptoms, now with left intermittent sciatic      symptoms.  4. Bilateral knee osteoarthritis.  5. Intermittent trochanteric bursitis.   PLAN:  Refill the following medications:  Flexeril 5 mg 1 p.o. b.i.d., #60  p.r.n. back pain; Norco 7.5/325, 1 p.o. b.i.d. p.r.n. back pain or knee  pain, #60; Celebrex 200 mg 1 p.o. daily p.r.n. back or leg pain, #20, no  refills; Prilosec 20 mg 1 p.o. daily, #30,  3 refills.  Obtain open MRI of  the lumbar spine for left leg pain which has persisted now approximately 6  weeks, possibly 8 weeks, with left thigh numbness, left knee, pain, limited  standing and walking  tolerance, in a woman who is trying to continue to work two jobs.  We will  see her back in a month.  Review imaging study with her.           ______________________________  Brantley Stage, M.D.     DMK/MedQ  D:  07/05/2006 13:35:48  T:  07/06/2006 16:53:30  Job #:  [id number]

## 2011-02-18 NOTE — Assessment & Plan Note (Signed)
Ms. Gruetzmacher is single black female whose being seen in our Pain and  Rehabilitative Clinic for multiple complaints of pain including cervical,  shoulders, bilateral elbows and knees.   Her average pain in these areas is around 8 on a scale of 10. Her pain is  variable in these various areas including low back between intermittent,  constant, sharp, burning, dull, stabbing, tingling, aching. She also has  some complaints of the joints in her hand aching and hurting after activity  as well.   Her activity is interfered with quite a bit with her pain complaints. There  is no particular time of day which is worse. She reports that morning,  daytime, evening and night her pain is worse. Sleep is poor, pain is worse  with basically all activities and improves with rest, heat, ice, pacing her  activities and medication. She gets good relief with her current medications  at this time.   She is able to climb stairs, she drives. She needs assistance with  toileting, meal preps, household duties and shopping. She is otherwise  independent with her self care.   She reports some bladder control problems. There appears to be some leakage  when she laughs. There was some overflow incontinence and she reports that  she is really not incontinent with her bowels but she does experience  diarrhea intermittently and she needs to get to the bathroom quickly if she  is having some diarrhea. I asked her to talk to her primary care physician  regarding both her bowel and bladder problems.   She does admit to some tingling spasms, confusion, depression, anxiety as  well as her coughing and shortness of breath.   There are no other new changes in her past medical, social or family history  other than what I recently reviewed in the history of present illness.   PHYSICAL EXAMINATION:  Blood pressure 110/59, pulse 61, respirations 16, 99%  saturation on room air.  GENERAL:  She is an obese black female,  does not appear in any distress  initially although she does get somewhat tearful and upset when discussing  her function and her pain complaints. She is otherwise oriented x3.   She is able to stand after being seated, she does seem a bit stiff after she  gets up. She does not have a limp but does have a rather slow symmetric gait  which is quite stable. Her range of motion is very minimally limited in her  cervical spine, very minimally limited in her lumbar spine but more so than  the cervical region especially with flexion extension. She complains of pain  in all these ranges. Seated reflexes are symmetric and intact in upper and  lower extremities. Sensation is intact throughout upper and lower  extremities. Motor strength is intact in the 5/5 range upper and lower  extremities including shoulder abductors, biceps, triceps, wrist extensors,  finger flexors and intrinsic's. Hip flexors, knee extensors, dorsiflexor,  plantar flexors, EHL are all 5/5. Examination of her hands in particular  today revealed no abnormal joint swelling, tenderness, erythema, or warmth.  She does have some mild tenderness at the [facility] joint on the right mildly so  on the left. Otherwise there is really no significant joint tenderness.   IMPRESSION:  1.  Mild carpal metacarpal arthritis right hand.  2.  Cervicalgia.  3.  Lumbago.  4.  Bilateral patellofemoral joint involvement.   PLAN:  Will have the patient set up to see  occupational therapy to go over  joint protection technique for her hand. I would like to see her in a  generalized conditioning program with an emphasis on body mechanics, flare  up, protocol, management and __________ stabilization. Will refill the  following medications for her today:   1.  Flexeril 10 mg, 1 p.o. b.i.d. #60 p.r.n. spasm.  2.  Prilosec 20 mg 1 p.o. daily, #30.  3.  Relafen 75 mg 1 p.o. b.i.d. #60, may use up to 15 days per month.  4.  Ultram 1-2 p.o. daily p.r.n. pain,  #60.   The patient does admit to continued marijuana use, last used on March 23, 2005. She would like to followup with Dr. Leonides Cave as well to obtain some  coping skills dealing with changes in her function and pain.       DMK/MedQ  D:  04/15/2005 13:41:47  T:  04/15/2005 17:06:36  Job #:  [id number]   cc:   Gladstone Pih, Ph.D.  [address] [place]  [place] [address]

## 2011-02-18 NOTE — Assessment & Plan Note (Signed)
Bridget [name] is a 50 year old African-American female with a history of  lumbago and mild lumbar spondylosis with intermittent lower extremity  pain.   She also has a history of bilateral knee osteoarthritis and trochanteric  bursitis.   She is back in today and states her average pain is about a 9 on a scale  of 10.  She feels that the injection only lasted really a few days.  She  underwent a lumbar epidural steroid injection on December 26, 2006, which  was mildly beneficial.   She states her pain is variable in nature, sometimes more recalcitrant,  sometimes more intermittent, sharp, dull, stabbing, tingling, aching in  nature.  The majority of her pain is noted throughout the lumbar and  lateral hip region and then she is also is complaining of bilateral knee  pain, worse on the left today, notes some swelling as well and some  right heel pain which is bothering her lately.   Pain is worse with walking, bending, inactivity, a variety of activities  in general.  Improves with rest, heat, exercise, medications, TENS unit.  Occasionally, injections help as well and pacing her activities helps  her.  She is getting good relief with the current meds that she is on.   MEDICATIONS:  Medications prescribed by this clinic include:  1. Norco 7.5/325 mg, up to 75 per month.  2. Celebrex 200 mg, not more than 20 per month.  3. Flexeril 5 mg in the evening.  4. Prilosec 20 mg a day.  5. Lidoderm on a p.r.n. basis.  6. Topamax 50 mg twice a day.   She is able to walk 20-25 minutes at a time.  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  Is  independent with all of her self-care and higher level hospital duties.   Denies problems controlling bowel or bladder.  Admits to depression but  denies any suicidal ideation.   REVIEW OF SYSTEMS:  Negative, except for occasional lymph swelling which  she reports is actually in the left knee today.   PAST  MEDICAL HISTORY:  No other new changes since last visit.   FAMILY HISTORY:  No other new changes since last visit.   SOCIAL HISTORY:  No other new changes since last visit.   PHYSICAL EXAMINATION:  Blood pressure 127/56, pulse 82, respirations 16,  98% saturation on room air.   She is an obese, black female who appears her stated age.  She does not  appear in any distress.  She is oriented x3.  Her affect is alert,  cooperative, slightly irritable.   She is able to transition from sitting to standing quite easily.  Her  gait in the room is nonantalgic.  Lumbar motion is mildly limited.  She  has full range of motion in her knee, although she does state that there  is some pulling and swelling in the left knee when she flexes it.   Her sensation is intact in the lower extremities.  Motor strength is  intact in the lower extremities and without focal weakness.   She has tenderness with palpation over bilateral trochanters.   Knee exam reveals intact medial and lateral ligaments, intact AP  ligament.  Full range of motion.  Mild effusion noted on the left.  Medial and lateral joint line tenderness are noted in the left knee as  well.   Right heel is examined.  There is no tenderness along the  Achilles  tendon.  The midportion of the plantar fascia is not tender, however she  is tender just in the center of the right heel at the insertion of the  plantar fascia.   IMPRESSION:  1. Lumbago with history of mild lumbar spondylitic changes without      root compression.  2. Bilateral trochanteric bursitis.  3. Bilateral knee osteoarthritis.  4. Right heel pain most likely consistent with a mild plantar      fasciitis.   PLAN:  1. We will refill the following medications for her:  Celebrex 200 mg      one p.o. q. day p.r.n. pain, #20 with 3 refills; Flexeril 5 mg one      p.o. q.h.s., #30, p.r.n. back pain, 3 refills; Norco 7.5/325 mg one      p.o. b.i.d. to t.i.d., 75 tablets, no  refills.  2. We will set her up for physical therapy addressing soft tissue      deficits around the hips and massage and soft tissue work      throughout the lumbar and gluteal musculature.  3. MRI left knee.  May have her follow up with orthopedist.  4. Right heel pain consistent with plantar fasciitis so we will obtain      x-ray, however, to rule out any other problems.  We will see her      back in a month.           ______________________________  Brantley Stage, M.D.     DMK/MedQ  D:  02/05/2007 10:13:31  T:  02/05/2007 10:37:52  Job #:  [id number]

## 2011-02-18 NOTE — Assessment & Plan Note (Signed)
HISTORY OF PRESENT ILLNESS:  Bridget [name] is a 50 year old African American  female who is being seen in our Pain and Rehabilitative Clinic for  predominantly chronic low back pain.  She also has pains in other joints  that are intermittent for her, bilateral shoulder pain, elbow pain, hand  pain, hip pain, knee pain and ankle pain.   Her average pain in these areas is about 8/10.  She reports her pain as  constant throughout the day.  Sleep is poor.  She states that she gets good  relief with her current medications.  Her pain is described as constant,  sharp, burning, dull, stabbing, tingling and aching in its nature.  Pain is  worse with basically most activities and inactivity.  Improves with rest,  heat, pacing activities, medications and TENS unit.  Able to walk between 10-  15 minutes at a time.  She is independent with her self care.  She assists  in the care of two children.  Admits to some depression.  Denies suicidal  ideation.  No changes in her review of systems, otherwise.   No change in past medical, social or family history since last visit.   PHYSICAL EXAMINATION:  VITAL SIGNS:  Blood pressure 104/55, pulse 92,  respirations 16, 99% saturation on room air.  GENERAL APPEARANCE:  She is a well-developed, mildly obese black female who  appears her stated age.  She is oriented x3.  Her affect is bright, alert,  cooperative and pleasant.  She is able to stand easily after being seated.  Her gait is nonantalgic.  She has normal base of support.  Normal heel-toe  mechanics.  She is able to flex forward, extend back.  No pain behavior is  noted.  She does have some limitations in the lumbar range.  Some mild  limitations in cervical range.  Full shoulder range of motion is noted,  however.  Reflexes are symmetric and intact in upper and lower extremities.  Motor strength is good throughout.  No focal weaknesses are noted in upper  and lower extremities.  No sensory deficits  are noted.   IMPRESSION:  1.  Lumbago.  2.  Degenerative disk disease lumbar spine.  3.  Depression.  4.  History of trochanteric bursitis bilaterally.   PLAN:  1.  Will refill Flexeril today 5 mg one p.o. b.i.d. #60, two refills;      Ibuprofen 600 mg one p.o. q.a.m. #30, two refills; Norco 7.5/325 one      p.o. b.i.d., no refills.  She continues to take Effexor on a p.r.n.      basis, and she takes her Neurontin on a p.r.n. basis.  2.  We discussed this.  She does admit to some depression.  She states that      she just is not very good at taking these medications routinely.  She      will try to do this over the next month, or we may consider just      discontinuing them if she feels she does not need them.  Will reassess      this further in one month.  3.  Regarding the Gabapentin, she states that it does seem to make her      drowsy.  I asked her to use it at night if she has some difficulty with      sleeping as it is somewhat beneficial in pain management and can be      somewhat  sedating as well.  4.  Today, she brings up that she believes she has rheumatoid arthritis and      that she may need to be on disability for her rheumatoid arthritis.  I      discussed this further with her that she has more of a degenerative      condition, and at this point, I do not feel that she is disabled.  She      would like to discuss this further on a visit in the      future.  Will have her follow up with nursing staff next month for      refill in medications.  She has been stable.  No problems with over      sedation at this point.  No problems with constipation.           ______________________________  Brantley Stage, M.D.     DMK/MedQ  D:  12/05/2005 09:25:29  T:  12/05/2005 13:53:45  Job #:  [id number]

## 2011-02-18 NOTE — Assessment & Plan Note (Signed)
Bridget [name] is back in today for a recheck and refill of medications.  She  is a 50 year old African-American woman being seen in our pain and rehab  center for chronic complaint of bilateral cervical pain, shoulder pain,  lower back pain, and new complaint of depression.  She was asking today to  be started on an antidepressant.  Apparently her primary care physician  requested our clinic start her as well.   Her average pain is 8 on a scale to 10.  Sleep is poor.  She complains of  not sleeping well, poor appetite, hopelessness, crying spells up to 2 times  a week. She reports most activities worsen her pain, and it improves with  rest, heat, ice, taking activities, medications, TENS unit, injections.  Pain is described as intermittent and sometimes more constant and variable  in nature, being sharp, dull, burning, stabbing, tingling, aching.  She gets  good relief with current medications.  The amount of time she can walk is  variable.   She is able to climb stairs.  She is able to drive.  She is independent with  her self care.  She does do some house cleaning up to 2 hours a day with 10  hours a week total.   REVIEW OF SYSTEMS:  Evaluated in Health and History form today.   PAST MEDICAL, SOCIAL, FAMILY HISTORY:  Unchanged since last visit.  Denies  illegal drug use, denies alcohol use; however, does continue smoking at 1/2  to 1-1/2 packs of cigarettes a day.   PHYSICAL EXAMINATION:  VITAL SIGNS:  Blood pressure 95/64, pulse 91,  respirations 16, 99% saturation on room air.  GENERAL:  She is a well-developed, obese, African-American female who does  not appear in any distress.  She does seem a bit depressed in her affect.  She is oriented x3, however. She is alert and cooperative and pleasant.  She  is able to stand up after being seated.  She has a slightly antalgic gait.  Limitations in lumbar range of motion.  Limitations in cervical range of  motion.  Reflexes are  symmetric and intact lower extremities 1 to 2+  throughout.  Motor strength nonfocal.   IMPRESSION:  1.  Depression.  2.  L4-5 disk bulk with possible impingement right L5.  No radicular      symptoms noted currently.  3.  Degenerative disk disease L3-4, L4-5.  4.  Cervicalgia.  5.  Patellofemoral joint involvement.  6.  Trochanteric bursitis bilaterally.  At this time on exam, she was quite      tender at the trochanter as well as at the distal insertion, distal to      the knee bilaterally.   PLAN:  1.  She does not need refills on ibuprofen or Neurontin or cyclobenzaprine.      We will refill her Norco 5/325 one p.o. 3 times a day, #90, no refills;      and start her on Effexor XR 75 mg 1 p.o. daily, #30.  Side effects were      reviewed with her today as well.  She would like trial and was given a      starter pack 37.5 mg once a day for a week, then up to 75 mg 1 p.o.      daily, #30 given.  2.  We will see her back in a month to assess her.  She will let us know if      she  has any trouble on the new medications.  She does note she missed      Dr. Earl Gala appointment and does not really want to follow up      with him at this time.  She is requesting vocational rehabilitation      referral and will go ahead and do this and see her back in a month.           ______________________________  Brantley Stage, M.D.     DMK/MedQ  D:  08/17/2005 13:13:10  T:  08/17/2005 19:18:27  Job #:  [id number]

## 2011-02-18 NOTE — Assessment & Plan Note (Signed)
Friday, October 20, 2006.   Bridget [name] is a 50 year old black female who is being seen in our pain  and rehabilitative clinic for multiple pain complaints, including  bilateral shoulder pain, bilateral elbow pain, low back pain, bilateral  lower extremity pain.   She was last seen by me on November, 28.  She has had interim nursing  visits for refills of her medications.   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  She reports poor sleep.  Reports good  relief with the pain medication that she is prescribed.  She states that  it helps her maintain her current function.  The pain in the low back is  variable.  She has good days and bad days, she states.  Typically worse  with walking, bending, sitting, prolonged activity of any kind.  Improves with rest, exercise, medication, and pacing her activities.   She is able to climb stairs.  She is currently driving.  She is  independent with her self-care, for the most part.  Occasionally, she  will need some assistance with bathing and dressing, reaching her lower  extremities.   Admits to some numbness and tingling.  Denies suicidal ideations.  Denies depression, anxiety.  Denies bowel or bladder control problems.  Denies constipation or over-sedation from the use of medication.   No interim medical problems since she was last seen on November 28th.   She did have a flare-up over the last weekend, which has seemed to have  calmed down.  She has had some intermittent wrist problems, which are  currently not a problem today.   Medications prescribed by this clinic include Norco 7.5/325 1 p.o.  b.i.d. #60 each month, Flexeril 1 p.o. b.i.d. p.r.n., Prilosec 20 mg  daily, Celebrex 200 mg on a p.r.n. basis, not more than 20 tablets a  month, and Topamax 50 mg 1 p.o. b.i.d.   PHYSICAL EXAMINATION:  VITAL  SIGNS:  Blood pressure is 116/68, pulse 81,  respirations 17, 97% saturated on room air.  GENERAL:  She is a mildly obese black female who appears her stated age.  She is oriented x 3.  She does not appear in any distress.  Her affect  is bright.  She is cooperative and overall pleasant.  She was somewhat  irritable.  Apparently, one of the on-call physicians did not return her  call.  She was upset about this.  Her speech was clear.  She followed  commands without any difficulty.  NEUROMUSCULAR:  She transitions from sit to stand easily.  Gait in the  room was stable, nonantalgic.  She does appear a little bit stiff when  she gets up; however, her gait is symmetric.  She has limitations in  lumbar motion, especially with forward flexion and extension as well as  lateral flexion.  Seated reflexes are symmetric at the patellar tendon,  diminished at the Achilles tendon.  Motor strength is in the 5/5 range.  No pain with internal or external rotation of her hips.  Motor strength  is good throughout both lower extremities.  She sometimes minces with  palpation in the lower lumbar paraspinal muscles.   IMPRESSION:  1. Left intermittent neuropathic leg pain.  2. Chronic low back pain.  3. Bilateral knee osteoarthritis.  4. Intermittent trochanteric bursitis.   PLAN:  Refill Ms.  Scioneaux's Norco 7.5/325 1 p.o. b.i.d. to t.i.d. #75.  No refills.  Will set her up for a TENS unit trial.  Will have them set  her up for a flexible lumbar support as well as the physical therapy  department.  Will see  her back in a month.  She does not need refills on her other  medications.  I have written a handicap parking placard for her today.           ______________________________  Brantley Stage, M.D.     DMK/MedQ  D:  10/20/2006 11:09:06  T:  10/20/2006 11:57:46  Job #:  [id number]

## 2011-02-18 NOTE — Assessment & Plan Note (Signed)
Bridget [name] is a 50 year old black female who is being seen in our pain  and rehab clinic for multiple pain complaints, predominantly however for  low back pain and lower extremity pain, lateral hip pain.   She is back in today for a refill of her medications.  She states her  average pain, predominantly located in the low back and lateral hip  region is between an 8-10 on a scale of 10.  She states her sleep tends  to be poor, however she is getting good relief with the meds provided  through this clinic.   This includes Norco 7.5/325 two to three times a day, not more than 75  tablets per month, Flexeril 5 mg 1 p.o. b.i.d. p.r.n., Prilosec 20 mg q.  day, Celebrex 200 mg q. day, not more than 20 tablets per month and  Topamax 50 mg 1 p.o. b.i.d.   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  She is independent with all of her self  care.  She functions at a fairly high level.   Denies depression, anxiety or suicidal ideation.  No changes in her  health and history form regarding her review of systems.   No changes in past medical, social or family history since our last  visit.  She states overall she has been quite healthy.   Blood pressure today is 111/65, pulse 80, respirations 16, 100%  saturated on room air.  She is an obese, black female who appears her  stated age.  She is oriented x3.  Her affect is bright and alert.  She  is cooperative and pleasant.  Speech is clear.  Follows commands without  any difficulty.  She does not appear in any distress.   She transitions from sit to stand easily.  Gait in the room is  nonantalgic.  Her balance is good.  Romberg's test negative.   Reflexes symmetric at patellar tendons and at the Achilles tendons 1-2+  bilaterally.  No abnormal tone is noted.  Straight leg raise is  negative.  She reports a decreased sensation over the toes in the right  lower extremity.  She has  tenderness over the right heel as well.   Motor strength however is good in both lower extremities.  No focal  weakness is appreciated.   IMPRESSION:  1. Lumbago with a history of lumbar spondylosis.  2. Intermittent left neurogenic leg pain.  3. Bilateral knee osteoarthritis.  4. Intermittent trochanteric bursitis.   PLAN:  We will refill the following medications for Bridget [name] today.  Topamax 50 mg 1 p.o. b.i.d. #60, 3 refills.  Celebrex 200 mg 1 p.o.  b.i.d. p.r.n. back or leg pain #20, 3 refills.  Prilosec 20 mg 1 p.o. q.  day #30, 3 refills.  Norco 7.5/325 one p.o. b.i.d. to t.i.d. #75, no  refills.  She has p.r.n. Celebrex that she does not need a refill on at  this point.   She has trialed a Tens unit and found it to be somewhat helpful in  helping maintain her function.  She is also possibly interested in  obtaining a lumbar support at some point.   She has been stable on these medications, no aberrant behavior has been  noted.  She is maintaining a rather high level of function with their  use.  Next month we will consider further evaluation of her hips as a  potential source  for her pain as well in the hip region.  May consider a  radiograph.           ______________________________  Brantley Stage, M.D.     DMK/MedQ  D:  11/13/2006 13:12:54  T:  11/13/2006 13:36:10  Job #:  [id number]

## 2011-02-18 NOTE — Op Note (Signed)
NAME:  Bridget [name], FORMOSA                        ACCOUNT NO.:  [id number]   MEDICAL RECORD NO.:  [id number]                   PATIENT TYPE:  AMB   LOCATION:  DSC                                  FACILITY:  [facility]   PHYSICIAN:  Lubertha Basque. Jerl Santos, M.D.             DATE OF BIRTH:  [birth year]   DATE OF PROCEDURE:  12/31/2002  DATE OF DISCHARGE:                                 OPERATIVE REPORT   PREOPERATIVE DIAGNOSES:  1. Left shoulder impingement.  2. Left shoulder partial rotator cuff tear.   POSTOPERATIVE DIAGNOSES:  1. Left shoulder impingement.  2. Left shoulder partial rotator cuff tear.   PROCEDURE:  1. Left shoulder arthroscopic acromioplasty.  2. Left shoulder arthroscopic debridement.   ANESTHESIA:  General and block.   SURGEON:  Lubertha Basque. Jerl Santos, M.D.   ASSISTANT:  Lindwood Qua, P.A.   INDICATIONS FOR PROCEDURE:  The patient is a 50 year old woman who works at  [employer]. [employer].  She injured her shoulder on the job more  than one year ago.  This persisted despite oral anti-inflammatories, an  exercise program, and several injections which have helped in a transient  way.  She has undergone an MRI scan which showed a partial thickness rotator  cuff tear and seems consistent with impingement.  She is offered operative  intervention at this point.  An informed operative consent was obtained,  after a discussion of the possible complications of, reaction to anesthesia,  and infection.   DESCRIPTION OF PROCEDURE:  The patient was brought to the operating suite  where general anesthetic was applied without difficulty.  She was also given  a block anesthesia in the area.  She was positioned in the beachchair  position and prepped and draped in a normal sterile fashion.  After the  administration of IV antibiotics, an arthroscopy to the left shoulder was  performed using a total of three portals.  The glenohumeral joint showed no  degenerative change,  and all labral structures were intact.  The biceps  tendon had about a 5% erosion in the interval but no real free edge to  debride.  There was a partial thickness tear of the rotator cuff seen from  below and she, thus, underwent a brief debridement.  In the subacromial  space, she had things consistent with impingement when the acromioplasty was  done.  She actually had a loose piece of bone at the tip of the acromion,  consistent with a very small os acromiale.  The [facility] joint was not addressed.  She had no pain in that location.  There were no prominent spurs.  On  further examination of her rotator cuff, she did appear to have a bursal  aspect partial thickness tear which was about 50% of the thickness of the  cuff.  This was debrided, but was only about 5 mm in size, and it was  elected not to  perform a repair, and simply a debridement and decompression.  The acromion was changed from a type 2 or 3 to a type 1 morphology, with the  bur in the lateral position, followed by the bur to the posterior position.  The shoulder was thoroughly irrigated at the end of the case, followed by  the placement of Marcaine with epinephrine and morphine.  Simple sutures of  nylon were used to loosely reapproximate the portals, followed by Adaptic  and a dry gauze dressing with tape.  The estimated blood loss and  intraoperative fluids can be obtained from the anesthesia records.   DISPOSITION:  The patient was extubated in the operating room and taken to  the recovery room in stable condition.   PLAN:  For her to go home the same day and follow up in the office in less  than one week.  I will contact her by phone today.                                                Lubertha Basque Jerl Santos, M.D.    PGD/MEDQ  D:  12/31/2002  T:  12/31/2002  Job:  [id number]

## 2011-02-18 NOTE — Assessment & Plan Note (Signed)
HISTORY OF PRESENT ILLNESS:  Bridget [name] is a 50 year old African American  female who is being seen in our Pain and Rehabilitative Clinic for multiple  chronic pain complaints including bilateral cervicalgia, shoulder pain,  bilateral elbow pain, bilateral lateral hip pain, bilateral knee pain and  leg pain as well as lumbago.  She is back in today and reports her average  pain is about a 9/10.  Today she is doing better, it is about a 7.  Pain is  described as sharp, dull, tingling, burning, stabbing, aching.  Interferes  with a good deal of enjoyment of life and general activity.  Sleep is  overall poor.  However, she gets good relief with current pain medications  that she is on.  Her chief complaint, however, today is her right knee.  She  has notices some swelling in the knee and some locking and popping  sensations.  The right knee is her main complaint, interferes with her  activity overall.  She is able to walk about 10-15 minutes with rest stops.  She is able to climb stairs.  She drives.  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  She is independent for the most part with  her self care occasionally with assistance of dressing, bathing, household  duties, meal prep.   REVIEW OF SYSTEMS:  Review of systems on the Health and History form are  noted.  Denies any suicidal ideation.  Denies bowel or bladder control  problems.   No new changes in past medical, social or family history since our last  visit.   PHYSICAL EXAMINATION:  VITAL SIGNS:  Blood pressure 109/62, pulse 81,  respirations 16, 99% saturation on room air.  GENERAL APPEARANCE:  She is obese [place] American female who appears her  stated age.  She is oriented x3.  Affect is bright, alert, cooperative and  pleasant.  NEUROLOGICAL:  She is able to stand up.  No pain behaviors are noted in the  room as she walks.  Her gait displays normal heel-toe mechanics, normal base  of support.  Balance  is quite good.  Some discomfort with flexion/extension.  Limited motion noted in the lumbar spine with this.  Seated reflexes are 2+  at the knees, 1+ at the patellar tendons.  Sensation is intact.  Motor  strength is good throughout both upper and lower extremities.  Straight leg  raising is negative.  EXTREMITIES:  Right knee reveals some mild mediolateral laxity compared to  the left.  I do not appreciate any AP instability.  No obvious effusion is  noted.  It is noted she does have some medial joint line tenderness,  however, with compression with palpations.   IMPRESSION:  1.  Right knee pain with intermittent swelling and locking and popping with      give-away sensation.  2.  Lumbago.  3.  Degenerative disk disease of the lumbar spine.  4.  Depression.  5.  Trochanteric bursitis, bilaterally overall somewhat improved.   PLAN:  Will refill the following medications for her today:  Ibuprofen 600  mg one p.o. b.i.d. #60, Prilosec 20 mg one p.o. daily #30.  She has about 10  days left of her Norco.  She will call in when she needs a refill.  Will go  ahead and refill her 7.5/325 one p.o. q.i.d. #90.  Will switch her from  Neurontin today.  She will discontinue that as of today and start Lyrica 50  mg one p.o. q.h.s.  May increase her next month to 75 mg one p.o. q.h.s.  Will also obtain a right knee MRI to rule out any meniscal or ACL injuries.  Will see her back in a month.           ______________________________  Brantley Stage, M.D.    DMK/MedQ  D:  12/21/2005 10:29:56  T:  12/22/2005 08:32:32  Job #:  [id number]

## 2011-02-18 NOTE — Assessment & Plan Note (Signed)
Bridget [name] is a 50 year old black female who brings in her 9-year-old  grandson with her today.   She is being seen for multiple pain complaints including right knee pain  which she has had problems with intermittent swelling, locking, and popping  and giveaway sensation.  Recent MRI is positive for small joint effusion,  medial compartment degenerative changes, and a tear of the posterior horn in  the medial meniscus, moderate chondromalacia patellofemoral joint, mild  chondromalacia of the lateral weightbearing compartment as well.  These  results were reviewed with her today in clinic and she was given a copy of  the report.  She would like to follow up with Dr. Farris Has over at [facility] for further treatment of this right knee.   She states her average pain is about a 9 on a scale of 10, localized to the  low back, right leg, sometimes goes down the right leg, bilateral knee pain.   Overall, sleep is poor but she is overall getting between good and complete  relief with the current meds that she is on.  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   REVIEW OF SYSTEMS:  On health and history form show no evidence of problems  with bowel or bladder control issues or suicidal ideation, does admit to  anxiety, however, intermittent trouble walking.  She states to the nurse  that she did have some problems with her balance, however, she states that  this is not a big problem and it is not anything new for her.   Past medical, social, family history:  Unchanged since our last visit.   PHYSICAL EXAMINATION:  VITAL SIGNS:  Blood pressure 144/68, pulse 77,  respirations 17, 98% saturated on room air.  GENERAL:  She is a mildly obese black female, does not appear in any  distress.  She appears her stated age.  She is oriented x3.  Affect is  bright, alert.  She is cooperative and pleasant.  MUSCULOSKELETAL:  She transitions from sit-to-stand without any difficulty.  Her  gait is not antalgic in the room.  She has a symmetric stride length.  Normal heel toe mechanics.  Normal base of support.  In fact, she is able to  tandem gait without much difficulty.  Romberg test is negative.  She has  limitations in lumbar range in all planes.  Seated, reflexes are evaluated  2+ at the patella tendons, 2+ at the Achilles tendons.  She has no sensory  deficits on exam today.  The patient is able to extend both knees fully.  She is able to flex her left leg to about 135 to 140 degrees without pain.  The right knee is also able to extend fully without discomfort, however,  flexion between 135 and 140 degrees causes quite a bit of discharge for her.  She also had tenderness along the medial lateral joint line of the right  knee and less so on the left.   IMPRESSION:  1.  Right knee pain with intermittent swelling, locking, popping, giveaway      sensation.  MRI done recently, 12/21/2005, read by Dr. Karin Golden, showed a      small joint effusion, medial compartment degenerative changes, tear in      the posterior horn of the medial meniscus, and moderate chondromalacia      of patellofemoral joint, and mild chondromalacia of the lateral      weightbearing compartment.  2.  Lumbago.  3.  Intermittent right lower extremity pain consistent with intermittent      sciatic symptoms.  4.  Trochanteric bursitis, overall improved.   MRI of the lumbar spine which was done, 05/19/2005, showed degenerative disk  disease L3-L4, L4-L5.  L4-L5 was more notably effected with a central  protrusion indenting the thecal sack.  This material contacts the descending  right L5 root in the right lateral recess.  This report was read by Dr.  Holley Dexter.   I will refill the following medications for her today:  1.  Lyrica 50 mg, one p.o. t.i.d. #90 with three refills.  2.  Norco 7.5/325, one p.o. b.i.d., #60, no refills.  3.  Ibuprofen 600 mg, one p.o. b.i.d., #60, three refills.  4.   Prilosec 20 mg, one p.o. every day, #30, with three refills.   She was started on Lyrica last month and was gradually titrated up.  She is  doing well on this medication and feels it has been helpful in helping her  rest and also helping her with managing her pain.  We have had no problems  recently with any aberrant behavior with the use of her narcotics.  She has  used her medications appropriately this month.   We will see her back in a month.  We will refer her to Dr. Farris Has over at [facility].  She is requesting  him to evaluate her right knee further.           ______________________________  Brantley Stage, M.D.     DMK/MedQ  D:  01/18/2006 10:44:56  T:  01/18/2006 23:59:15  Job #:  [id number]

## 2011-02-18 NOTE — Assessment & Plan Note (Signed)
Ms. Cowles is a 50 year old African-American female who is being seen in  our pain and rehabilitative clinic for chronic low back pain predominantly.   She was last seen September 14, 2005.  She also has a history of depression,  a history of cervicalgia, patellofemoral joint involvement and trochanteric  bursitis.   She comes in today voicing concerns that multiple family members have had  problems with blood clots and her mother has told her recently to have  herself checked for blood clots.   Ms. Wysong does not have any problems with swelling in her calves or  increased pain.  She does have complaints of occasional muscle cramps and  muscle spasms in the toes.   Her low back pain is described as sharp, burning, intermittent, stabbing,  dull, tingling and aching.  Average is about a 9 on a scale of 10.  Sleep is  poor.  Pain is worse with most activities including walking, bending,  sitting and standing.  Improves with rest, heat, ice, medications and TENS  unit.   She gets good relief with the current medications that she is on.   She is independent with feeding, dressing, bathing, toileting.  Needs some  assistance with meal prep, household duties, shopping.   REVIEW OF SYSTEMS:  Positive for depression, anxiety, confusion, spasms,  numbness, tingling, weakness, poor appetite and shortness of breath.   The past medical, social, family history are unchanged since last visit.  Continues to smoke one pack of cigarettes per day.   PHYSICAL EXAMINATION:  VITAL SIGNS:  Blood pressure 116/68, pulse 76,  respirations 16, and 100% saturated on room air.  GENERAL:  She is a well-developed, well-nourished woman who does not appear  in any distress, who is oriented x3.  Affect bright, alert, cooperative and  pleasant.  MUSCULOSKELETAL/NEUROLOGIC:  Gait is normal.  She has limitations in lumbar  range in all planes, some limitations in cervical range.  Seated reflexes  are  symmetric, intact in the lower extremities.  Motor strength is 5/5,  straight leg raise is negative.   IMPRESSION:  1.  Lumbago.  2.  Degenerative disk disease, lumbar spine.  3.  Depression.  4.  History of trochanteric bursitis bilaterally.  5.  The patient is expressing concern about family history of blood clots.   PLAN:  We will refill her medications today:  1.  Norco 7.5/325 mg one p.o. b.i.d. p.r.n. back pain, #60.  2.  Effexor 75 mg one p.o. daily., #30, two refills.  3.  Neurontin 300 mg one p.o. q.h.s. p.r.n., #3 refills and #30 count pills.   Regarding family history of blood clots, we have requested her to follow up  with Dr. Larina Bras with this information.  Dr. Larina Bras is at the [facility].  Will also send her a sample of __________ today, and will CC a  copy of our note over to Dr. Larina Bras.           ______________________________  Brantley Stage, M.D.     DMK/MedQ  D:  10/24/2005 14:01:28  T:  10/25/2005 08:57:18  Job #:  [id number]   cc:   Ace Gins, MD  Fax: [phone]

## 2011-02-18 NOTE — Assessment & Plan Note (Signed)
Bridget [name] is a 50 year old African American female with history of  lumbago and mild lumbar spondylosis with intermittent lower extremity  pain.   She is back in today for refill of her medications, continues to have  chronic low back pain, lateral hip pain and predominantly right heel and  foot pain.   New caveat today, she reports some intermittent numbness in the right  dorsum of her foot which occurs throughout the day for the last few  weeks now.   Denies any injuries.  Just came on out of nowhere which goes across the  right toes and into the heel.   Average pain is about an 8 on a scale of 10.  Pain is described variably  including intermittent, occasionally constant, more sharp, burning,  dull, stabbing, tingling, aching.   Sleep tends to be poor.  She reports good relief with the current  medications that she is prescribed.   Pain is worse with a variety of activities.  Improves with rest, heat,  therapy, pacing her activities, medications and TENS unit.   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  She is able to drive.  She  is able to climb stairs.  She is independent with her self-care.  She  does occasionally need help with higher level household duties.   Denies problems controlling bowel or bladder.  No changes in past  medication, social or family history since last visit.  Continues to  smoke half pack of cigarettes a day.   PHYSICAL EXAMINATION:  GENERAL APPEARANCE:  She is well-developed, obese  black female who appears her stated age.  She does not appear in any  distress.  VITAL SIGNS:  Blood pressure 113/69, pulse 73, respirations 18, 100%  saturated on room air.  NEUROLOGIC:  She is oriented x3.  Her affect is bright, alert,  cooperative and pleasant.  She is able to transition from sitting to  standing easily.  Gait in the room is normal.  Tandem gait, Romberg's  are performed adequately.  Balance overall is quite good.  She has some  limitations in lumbar range of motion.  Reflexes are symmetric and  intact in the lower extremities.  Motor strength is 5/5, no focal  deficits are appreciated.  No abnormal tone is noted.   Mild sensory deficit noted over the right thigh.   IMPRESSION:  1. Lumbago with history of mild lumbar spondylotic change with      possible some mild root irritation today.  2. Bilateral trochanteric bursitis.  3. Bilateral knee osteoarthritis.  4. Right heel pain, may be related to nerve root irritation, also may      be consistent with mild plantar fasciitis.  Will continue physical      therapy.   Will refill the following medications for her.  1. Lidoderm up to three patches 12 hours on and 12 hours off, two      boxes with three refills.  2. Norco 7.5/325 one p.o. b.i.d. to t.i.d. p.r.n. low back pain or      knee pain #75.  3. Topamax 50 mg one p.o. b.i.d. #60 three refills.   She has been stable on these medications.  Has not had any adverse side  effects from them and tolerating them well. Overall is functioning at a  high level with their use. Displays no aberrant behavior.  Will see her  back in a month.  We did discuss the possibility of MRI at this point.  She would like to hold off.  She is not interested in any kind of  surgery at this point, nor injection, does not feel the pain is  sufficient to warrant more aggressive therapeutic interventions.  Will  see her back in a month.           ______________________________  Brantley Stage, M.D.     DMK/MedQ  D:  03/05/2007 14:48:09  T:  03/05/2007 20:27:46  Job #:  [id number]

## 2011-02-18 NOTE — Assessment & Plan Note (Signed)
Bridget [name] is a 50 year old black female who is accompanied by her 2-year-  old grandson today.  She is back in for a recheck and refill of her  medications.   She states that she was recently seen by Dr. Farris Has for evaluation of her  knee.  She was told that she has surgical options.  She would like to pursue  it; however, at this point, [name] prefers conservative management and  is attempting to lose weight to decrease the pain in her knee.   Her average pain is about 8/10, localized bilateral shoulders, predominantly  right hip and right knee.   Sleep tends to be poor overall; however, she gets good relief with the  current meds that she is on.  She takes hydrocodone twice daily, 60 per  month.  The pain is typically described as variable in nature.  It comes and  goes, sharp, burning, dull, stabbing, tingling aching.  She is improving her  walking now.  She is up to 30 minutes at a time.  She is climbing stairs.  She is able to drive.  She works six hours a week.  She also helps take care  of some grandchildren.   Denies suicidal ideations.  Denies bowel or bladder control problems.  Does  admit to anxiety and depression, however.   No other changes in past medical, social, or family history since our last  visit.   Patient was last seen on January 18, 2006.   PHYSICAL EXAMINATION:  VITAL SIGNS:  Blood pressure 111/58, pulse 72,  respirations 17, 99% saturation on room air.  GENERAL:  She is a well-developed and well-nourished black female who  appears her stated age.  She is oriented x3.  Affect is bright, alert.  She  is cooperative and pleasant.  NEUROMUSCULAR:  She transitions from sit to stand without any difficulty.  Gait is normal in the room.  No antalgia is noted.  Romberg test is  negative.  Tandem gait is normal.  She has some minimally decreased range of  motion with cervical rotation to the left versus right.  She has full  shoulder range of motion as  well.  Reflexes are intact in the upper and  lower extremities, 2+ at the patellar tendons; however, 0 at the Achilles  tendon.  There is no abnormal tone noted.  No clonus noted.   IMPRESSION:  1.  Right knee osteoarthritis.  2.  Lumbago.  3.  Intermittent sciatic symptoms in the right lower extremity.  4.  Trochanteric bursitis, overall improved.   PLAN:  Patient has refills at this point on Lyrica.  She takes 50 mg 3 times  a day.  She is also taking ibuprofen 600 mg twice daily and Prilosec 20 mg  once every day.  We will go ahead and refill her Norco 7.5/325 mg 1 p.o.  b.i.d. #60, no refills.  No aberrant behavior has been noted.  She is taking  her medication appropriately.  She states that she gets good relief with the  current medication that she is on.  We will see her back in a month.           ______________________________  Brantley Stage, M.D.    DMK/MedQ  D:  02/15/2006 11:41:09  T:  02/15/2006 20:13:15  Job #:  [id number]

## 2011-06-09 ENCOUNTER — Ambulatory Visit: Payer: Medicaid Other | Admitting: Family Medicine

## 2011-06-10 ENCOUNTER — Ambulatory Visit (HOSPITAL_COMMUNITY)
Admission: RE | Admit: 2011-06-10 | Discharge: 2011-06-10 | Disposition: A | Discharge status: Home / Self Care | Payer: Medicaid Other | Source: Ambulatory Visit | Attending: Family Medicine | Admitting: Family Medicine

## 2011-06-10 ENCOUNTER — Encounter: Payer: Self-pay | Admitting: Family Medicine

## 2011-06-10 ENCOUNTER — Ambulatory Visit (INDEPENDENT_AMBULATORY_CARE_PROVIDER_SITE_OTHER): Payer: Medicaid Other | Admitting: Family Medicine

## 2011-06-10 ENCOUNTER — Other Ambulatory Visit: Payer: Self-pay

## 2011-06-10 DIAGNOSIS — B070 Plantar wart: Secondary | ICD-10-CM

## 2011-06-10 DIAGNOSIS — F309 Manic episode, unspecified: Secondary | ICD-10-CM

## 2011-06-10 DIAGNOSIS — R9431 Abnormal electrocardiogram [ECG] [EKG]: Secondary | ICD-10-CM | POA: Insufficient documentation

## 2011-06-10 MED ORDER — RISPERIDONE 0.5 MG PO TABS: 0.5000 mg | ORAL_TABLET | Freq: Two times a day (BID) | ORAL | Status: DC

## 2011-06-10 NOTE — Assessment & Plan Note (Signed)
History and physical and young mania rating scale all point to her symptoms being due to mania or hypomania.   I discussed this at length with the patient and with the attending physician Dr. Celso Amy.  Plan to initiate therapy with Restoril 0.5 mg twice.  Her QTc. segment is 460.  We feel that respiratory will be safe at a low dose with a QTC segment.   The patient will followup in 2 weeks with her primary care provider where a EKG will be repeated.   Additionally she will attempt to schedule and with the mood disorder clinic at the family practice center. Patient expresses understanding red flags precautions including suicidality or homicidality provided to the patient.

## 2011-06-10 NOTE — Assessment & Plan Note (Signed)
This is bothersome to the patient she requests referral for podiatry.   Referral placed in letter written

## 2011-06-10 NOTE — Progress Notes (Signed)
Ms. Weissman presents to clinic today with 2 main issue  Mood: Her family made her come in because she has been increasingly "excited" and fast talking and agitated for the last 5 months.  Her symptoms have been increasing recently.  She denies feeling anxious or like she is having a panic.  She also notes a decreased need for sleep recently and is recently interested in losing weight.  She was tested with a young mania rating scale today and scored a 12 which is consistent with mania.  She notes in her past history multiple episodes of depression and has a family history significant for bipolar disorder.  She also notes that in the past she was treated with Topamax for depression.  Plantar wart: Present for years would like a referral to podiatry.  She has tried duct tape and over-the-counter therapy. And is debriding the wart herself.  She feels well otherwise.  PMH reviewed.  ROS as above otherwise neg  Exam:  BP 111/80  Pulse 85  Ht 5\' 1"  (1.549 m)  Wt 199 lb (90.266 kg)  BMI 37.60 kg/m2 Gen: Well NAD HEENT: EOMI,  MMM Lungs: CTABL Nl WOB Heart: RRR no MRG Abd: NABS, NT, ND Exts: Non edematous BL  LE, warm and well perfused.  Plantar wart on the lateral aspect of the right plantar surface.  Psych: Normal attention mood is slightly elevated.  Speech is slightly fast and pressure and she is slightly tangential however she is redirectable.  Her judgment appears intact she is well-groomed. She denies any hallucinations or delusions or suicidal or homicidal ideation.

## 2011-06-10 NOTE — Patient Instructions (Addendum)
Thank you for coming in today. I think that you have mild mania likely due to Type II bipolar disorder. This is usually treated with a medicine called Risperdal.  I'm starting with a low dose of a half milligrams twice a day. I would like you to start taking this and see your regular doctor in 2 weeks. If he feels like hurting your self or others or feel out-of-control I would like you to call the office or go to [facility] long emergency room. Please make an appointment with Dr. Cristal Ford in 2 weeks. Please also make an appointment with the mood disorder clinic as soon as possible. You can do this at the front desk. Thank you and take care.

## 2011-06-14 ENCOUNTER — Emergency Department (HOSPITAL_COMMUNITY)
Admission: EM | Admit: 2011-06-14 | Discharge: 2011-06-14 | Disposition: A | Discharge status: Home / Self Care | Payer: Medicaid Other | Attending: Emergency Medicine | Admitting: Emergency Medicine

## 2011-06-14 ENCOUNTER — Telehealth: Payer: Self-pay | Admitting: Psychology

## 2011-06-14 ENCOUNTER — Emergency Department (HOSPITAL_COMMUNITY): Payer: Medicaid Other

## 2011-06-14 DIAGNOSIS — M25559 Pain in unspecified hip: Secondary | ICD-10-CM | POA: Insufficient documentation

## 2011-06-14 NOTE — Telephone Encounter (Signed)
Bridget [name] called to schedule an MDC appointment.  She is new to [facility] but Dr. Denyse Amass saw her recently, did an evaluation and started her on medicine.  Next available for a new patient was end of October so elected to schedule her in a follow-up slot for tomorrow, 9/12 at 10:30.  I told her about the nature of the [facility] clinic, specifically, who would be present and that it would focus on her medications.  If she is unable to attend the appt, she agreed she would call.

## 2011-06-15 ENCOUNTER — Ambulatory Visit (INDEPENDENT_AMBULATORY_CARE_PROVIDER_SITE_OTHER): Payer: Medicaid Other | Admitting: Psychology

## 2011-06-15 DIAGNOSIS — F3181 Bipolar II disorder: Secondary | ICD-10-CM

## 2011-06-15 DIAGNOSIS — F3189 Other bipolar disorder: Secondary | ICD-10-CM

## 2011-06-15 MED ORDER — DIVALPROEX SODIUM ER 500 MG PO TB24: 500.0000 mg | ORAL_TABLET | Freq: Every day | ORAL | Status: DC

## 2011-06-15 NOTE — Assessment & Plan Note (Addendum)
Patient report of mood was sad and irritable today.  She had trouble concentrating on the questions and seemed uncomfortable (physically).  She apparently injured her hip yesterday and subsequently went to the ED.  Thoughts were reasonable directed.  History was somewhat difficult to elicit secondary to attention issues.    Did CIDI structured interview for Bipolar and patient endorsed every question.  This coupled with our interview and Dr. Zollie Pee assessment makes a diagnosis of Bipolar 2 disorder reasonable.  It is impairing.  There is no report of current substance use.  Problem list and medications reviewed.    Dr. Kathrynn Running recommended Depakote with the thought it might do a better job (than the Risperdal) of capturing the depression and irritability.  Reviewed medication with patient.  We can not get her back into [facility] until November 7th and ask that her PCP follow her with consultation from Drs. Pascal Lux and Coin.  The next follow-up should assess for tolerability of the Depakote, safety issues and efficacy with target symptoms being depression and irritability.  If Depakote is not well tolerated, Abilify was the second medicine Dr. Kathrynn Running was considering.  Patient should get Depakote level drawn at follow-up with PCP as long as she has been taking the medicine consistently for a week or more.

## 2011-06-15 NOTE — Progress Notes (Signed)
Bridget [name] presented for her initial Mood Disorder Clinic appointment.  She was seen about five days ago by Dr. Denyse Amass who diagnosed a hypomanic or manic episode and wrote a prescription for Risperdal.  [facility] did not get the prescription filled, preferring to wait until the appointment today.  Family history for Bipolar disorder reported in her half-sister and niece.  Reports mom was an alcoholic who attempted suicide "with a hatchet" but then stopped drinking.  She reported mood issues in her mother but no definitive diagnosis.  Father died when she was 80 years old.  Does not know much history.    Personal history of mental health issue includes an eight year history of depression.  She says it began when she started having pain with her hips.  Upon further questioning, she notes a post-partum depression with her oldest child (she was about 50).  She says she was not treated at that time.  She reports being treated with several medications for mental health issues but can only remember Wellbutrin which she thinks she took 4-5 years ago.  She notes a four year history of crack cocaine.  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  Used THC daily with last use reported about one year ago.  Denies use of any substances currently including alcohol.    Denies hospitalization for psychiatric reasons.    She reports sadness that is present most of the day, everyday.  She describes an "energetic mode" where she is "in charge, confident" and has a decreased need for sleep (will sleep 45 minutes to an hour).  These episodes last 2-3 days and the longest was five days.  Denies auditory or visual hallucinations.  Denies delusional thinking.  Per Dr. Zollie Pee note five days ago, patient presented at the urging of her family who described hypomanic like behavior.  Patient agrees with their assessment.    She is interested in taking a medication to try to get better mood control.  She does not like how her mood affects her family  (children).

## 2011-06-15 NOTE — Patient Instructions (Addendum)
Please reschedule your appt with Dr. Cristal Ford (cancel the one for the 17th) for some time around September 30th. Please schedule a follow-up for Mood Clinic for:  November 7th at 11:30. Dr. Kathrynn Running recommended a medicine called Depakote.  It is an anti-seizure medication that is often used to treat mood issues and is really good with irritability or mood swings.  You will need to have blood work to see if this medicine is at a therapeutic level. We will get blood work today as well. You can go to [email or web address] to read more about your illness if you would like.  It is called Bipolar II.

## 2011-06-16 ENCOUNTER — Encounter: Payer: Self-pay | Admitting: Psychology

## 2011-06-16 ENCOUNTER — Telehealth: Payer: Self-pay | Admitting: Psychology

## 2011-06-16 LAB — COMPREHENSIVE METABOLIC PANEL
ALT: 14 U/L (ref 0–35)
AST: 14 U/L (ref 0–37)
Albumin: 3.9 g/dL (ref 3.5–5.2)
Alkaline Phosphatase: 56 U/L (ref 39–117)
BUN: 5 mg/dL — ABNORMAL LOW (ref 6–23)
CO2: 22 mEq/L (ref 19–32)
Calcium: 9 mg/dL (ref 8.4–10.5)
Chloride: 107 mEq/L (ref 96–112)
Creat: 0.64 mg/dL (ref 0.50–1.10)
Glucose, Bld: 88 mg/dL (ref 70–99)
Potassium: 3.7 mEq/L (ref 3.5–5.3)
Sodium: 142 mEq/L (ref 135–145)
Total Bilirubin: 0.5 mg/dL (ref 0.3–1.2)
Total Protein: 6.3 g/dL (ref 6.0–8.3)

## 2011-06-16 LAB — CBC
HCT: 38.8 % (ref 36.0–46.0)
Hemoglobin: 12.6 g/dL (ref 12.0–15.0)
MCH: 28.4 pg (ref 26.0–34.0)
MCHC: 32.5 g/dL (ref 30.0–36.0)
MCV: 87.4 fL (ref 78.0–100.0)
Platelets: 324 10*3/uL (ref 150–400)
RBC: 4.44 MIL/uL (ref 3.87–5.11)
RDW: 15 % (ref 11.5–15.5)
WBC: 10.8 10*3/uL — ABNORMAL HIGH (ref 4.0–10.5)

## 2011-06-16 NOTE — Telephone Encounter (Addendum)
Discussed recent lab work with Dr. Kathrynn Running and informed patient of the results.  She has a follow-up scheduled with Dr. Cristal Ford and will need a Depakote level drawn at that time.  She is planning on starting the medicine today.

## 2011-06-17 ENCOUNTER — Encounter: Payer: Self-pay | Admitting: Family Medicine

## 2011-06-17 NOTE — Telephone Encounter (Signed)
I figured it out and sent it. Apparently you just type "dot cbc" and it pops into a letter. That's pretty awesome.

## 2011-06-20 ENCOUNTER — Ambulatory Visit: Payer: Medicaid Other | Admitting: Family Medicine

## 2011-07-08 ENCOUNTER — Other Ambulatory Visit: Payer: Self-pay | Admitting: Family Medicine

## 2011-07-08 ENCOUNTER — Encounter: Payer: Self-pay | Admitting: Family Medicine

## 2011-07-08 ENCOUNTER — Ambulatory Visit (INDEPENDENT_AMBULATORY_CARE_PROVIDER_SITE_OTHER): Payer: Medicaid Other | Admitting: Family Medicine

## 2011-07-08 VITALS — BP 114/78 | HR 91 | Temp 97.2°F | Ht 61.0 in | Wt 200.0 lb

## 2011-07-08 DIAGNOSIS — F3181 Bipolar II disorder: Secondary | ICD-10-CM

## 2011-07-08 DIAGNOSIS — F3189 Other bipolar disorder: Secondary | ICD-10-CM

## 2011-07-08 DIAGNOSIS — M549 Dorsalgia, unspecified: Secondary | ICD-10-CM

## 2011-07-08 DIAGNOSIS — L282 Other prurigo: Secondary | ICD-10-CM

## 2011-07-08 DIAGNOSIS — M79646 Pain in unspecified finger(s): Secondary | ICD-10-CM | POA: Insufficient documentation

## 2011-07-08 DIAGNOSIS — M79609 Pain in unspecified limb: Secondary | ICD-10-CM

## 2011-07-08 LAB — COMPREHENSIVE METABOLIC PANEL
AST: 15 U/L (ref 0–37)
BUN: 11 mg/dL (ref 6–23)
Calcium: 9.1 mg/dL (ref 8.4–10.5)
Chloride: 106 mEq/L (ref 96–112)
Creat: 0.72 mg/dL (ref 0.50–1.10)
Total Bilirubin: 0.3 mg/dL (ref 0.3–1.2)

## 2011-07-08 LAB — CBC
HCT: 39.7 % (ref 36.0–46.0)
MCH: 28.6 pg (ref 26.0–34.0)
MCHC: 32.2 g/dL (ref 30.0–36.0)
MCV: 88.6 fL (ref 78.0–100.0)
Platelets: 318 10*3/uL (ref 150–400)
RDW: 14.8 % (ref 11.5–15.5)

## 2011-07-08 NOTE — Patient Instructions (Addendum)
Nice to meet you. We will check labwork today. Continue taking fiber daily. Do not take fiber with medicines. Follow up with Dr. Pascal Lux on 11/7. Get your mammogram done as soon as possible. Try hydrocortisone ointment on your itchy spot. Try to avoid flexeril during the day, this can make you sleepy.

## 2011-07-08 NOTE — Progress Notes (Signed)
  Subjective:    Patient ID: Bridget [name], female    DOB: April 02, 1961, 50 y.o.   MRN: [id number]  HPI  1. Rash on breast. Has a small flat area of dry, irritated skin on left inferior breast. Patient thinks this is irritation from her bra underwire. Has been present for ~1 year. Pruritic and painful at times. Denies bleeding, oozing. Has tried neosporin ointment without help. Had a mammogram several years ago that was normal. Does self-breast exams and never felt a mass. Denies discharge.  2. Bipolar DO. Recently diagnosed. Started on risperdal then changed to depakote. Missed few doses initially, but now taking BID. Does not complain of side effects except for constipation (though chronically takes opiates and has some constipation). Thinks her mood has improved and is less emotional than before. Is sleeping much better at night. On further questioning she may be struggling with daytime sleepiness also but does not complain, only states she has trouble taking her son to school in the morning and would like a note for the school system so that he can transfer to a school that uses the bus system near her home. Also endorses forgetfulness but uncertain if this was prior to initiating medication. Denies feeling depressed, sad, sleep difficulty.   3. OA. Complains of left hip and bilateral knee pain. Being followed by [facility] and scheduled for left hip replacement next month.   4. Finger injury. Two months ago she hit her right 4th digit and has some pain since that time. No trouble moving the finger but has pain despite her attempts at splinting.   Review of Systems See HPI otherwise negative.     Objective:   Physical Exam  Vitals reviewed. Constitutional: She is oriented to person, place, and time. She appears well-developed and well-nourished. No distress.  HENT:  Head: Normocephalic and atraumatic.  Mouth/Throat: Oropharynx is clear and moist.  Eyes: EOM are normal. Pupils are equal,  round, and reactive to light.  Cardiovascular: Normal rate, regular rhythm, normal heart sounds and intact distal pulses.  Exam reveals no gallop.   No murmur heard. Pulmonary/Chest: Effort normal and breath sounds normal. No respiratory distress. She has no wheezes. She has no rales.  Neurological: She is alert and oriented to person, place, and time. No cranial nerve deficit. Coordination normal.  Skin:       Left breast has 2cm macular hyperpigmented skin. No hyperkeratosis, bleeding, ulceration. No underlying masses palpated. No LAD palpated.   Psychiatric: She has a normal mood and affect.          Assessment & Plan:

## 2011-07-08 NOTE — Assessment & Plan Note (Signed)
Pain is controlled. Constipation is more likely a side effect from chronic opiates. Reinforced daily miralax.

## 2011-07-08 NOTE — Assessment & Plan Note (Signed)
Manic symptoms seem much improved but uncertain how well she is actually tolerated medication (daytime sleepiness may be an issue). Will obtain depakote level and consider decrease in dose if high therapeutic range. Will also check CBC, LFTs to trend. Patient to follow up in 3-4 weeks with Mood clinic to reassess symptoms.

## 2011-07-08 NOTE — Assessment & Plan Note (Signed)
Location on breast. Possibly a mild atopic area vs irritant dermatitis. Given location on breast will obtain a diagnostic mammogram to rule out possibility of inflammatory malignancy. Recommend topical hydrocortisone. F/u prn.

## 2011-07-08 NOTE — Assessment & Plan Note (Signed)
Lingering pain from an injury 2 months ago. Advised to attempt buddy taping to splint in the short term. If still problematic, advised to follow up with orthopedist for xrays.

## 2011-07-19 ENCOUNTER — Other Ambulatory Visit: Payer: Medicaid Other

## 2011-08-03 ENCOUNTER — Other Ambulatory Visit: Payer: Medicaid Other

## 2011-08-08 ENCOUNTER — Encounter (HOSPITAL_COMMUNITY): Payer: Self-pay | Admitting: Pharmacy Technician

## 2011-08-09 ENCOUNTER — Encounter (HOSPITAL_COMMUNITY)
Admission: RE | Admit: 2011-08-09 | Discharge: 2011-08-09 | Disposition: A | Discharge status: Home / Self Care | Payer: Medicaid Other | Source: Ambulatory Visit | Attending: Orthopedic Surgery | Admitting: Orthopedic Surgery

## 2011-08-09 ENCOUNTER — Ambulatory Visit (HOSPITAL_COMMUNITY)
Admission: RE | Admit: 2011-08-09 | Discharge: 2011-08-09 | Disposition: A | Discharge status: Home / Self Care | Payer: Medicaid Other | Source: Ambulatory Visit | Attending: Orthopedic Surgery | Admitting: Orthopedic Surgery

## 2011-08-09 ENCOUNTER — Other Ambulatory Visit: Payer: Self-pay | Admitting: Family Medicine

## 2011-08-09 ENCOUNTER — Encounter (HOSPITAL_COMMUNITY): Payer: Self-pay

## 2011-08-09 DIAGNOSIS — Z01818 Encounter for other preprocedural examination: Secondary | ICD-10-CM | POA: Insufficient documentation

## 2011-08-09 DIAGNOSIS — Z01812 Encounter for preprocedural laboratory examination: Secondary | ICD-10-CM | POA: Insufficient documentation

## 2011-08-09 LAB — CBC
MCV: 87.4 fL (ref 78.0–100.0)
Platelets: 267 10*3/uL (ref 150–400)
RBC: 4.29 MIL/uL (ref 3.87–5.11)
WBC: 11 10*3/uL — ABNORMAL HIGH (ref 4.0–10.5)

## 2011-08-09 LAB — COMPREHENSIVE METABOLIC PANEL
ALT: 15 U/L (ref 0–35)
AST: 13 U/L (ref 0–37)
Alkaline Phosphatase: 67 U/L (ref 39–117)
CO2: 24 mEq/L (ref 19–32)
Chloride: 105 mEq/L (ref 96–112)
Creatinine, Ser: 0.72 mg/dL (ref 0.50–1.10)
GFR calc non Af Amer: >90 mL/min (ref >90)
Sodium: 140 mEq/L (ref 135–145)
Total Bilirubin: 0.2 mg/dL — ABNORMAL LOW (ref 0.3–1.2)

## 2011-08-09 MED ORDER — CEFAZOLIN SODIUM 1-5 GM-% IV SOLN: 1.0000 g | INTRAVENOUS | Status: DC

## 2011-08-09 NOTE — Pre-Procedure Instructions (Signed)
[name]  08/09/2011   Your procedure is scheduled on: Monday, November 12th  Report to [facility] at 11:20AM.  Call this number if you have problems the morning of surgery: [phone]   Remember:   Do not eat food:After Midnight.   Do not drink clear liquids: 4 Hours before arrival (7:20 AM).  Take these medicines the morning of surgery with A SIP OF WATER: DEPAKOTE               AND HYDROCODONE   Do not wear jewelry, make-up or nail polish.   Do not wear lotions, powders, or perfumes. You may wear deodorant   Do not shave 48 hours prior to surgery.   Do not bring valuables to the hospital.   Contacts, dentures or bridgework may not be worn into surgery .  Leave suitcase in the car. After surgery it may be brought to your room   For patients admitted to the hospital, checkout time is 11:00 AM the day of discharge.   Patients discharged the day of surgery will not be allowed to drive home.  Name and phone number of your driver:  TREONNA KLEE --  DTR                                                    Special Instructions: CHG Shower Use Special Wash: 1/2 bottle night before surgery and 1/2 bottle morning of surgery.   Please read over the following fact sheets that you were given: Pain Booklet and Surgical Site Infection Prevention

## 2011-08-09 NOTE — Telephone Encounter (Signed)
Refill request

## 2011-08-10 ENCOUNTER — Ambulatory Visit (INDEPENDENT_AMBULATORY_CARE_PROVIDER_SITE_OTHER): Payer: Medicaid Other | Admitting: Psychology

## 2011-08-10 DIAGNOSIS — F3189 Other bipolar disorder: Secondary | ICD-10-CM

## 2011-08-10 DIAGNOSIS — F3181 Bipolar II disorder: Secondary | ICD-10-CM

## 2011-08-10 MED ORDER — DIVALPROEX SODIUM ER 500 MG PO TB24: 1000.0000 mg | ORAL_TABLET | Freq: Every day | ORAL | Status: DC

## 2011-08-10 NOTE — Assessment & Plan Note (Addendum)
Report of mood is sad and anxious.  Affect is very consistent including tearfulness.  She has mildly pressured speech with some jumping from topic to topic.  Based on patient report today as well as documentation from her PCP at her follow-up, Depakote 1000 mg was well tolerated and modestly effective.  Patient reports motivation to restart.  See patient instructions for further plan.  Discussed MRSA and the things she can do to help deal with it (she has a prescription and instructions for disinfecting her home).  When asked about social support leading up to the surgery, Bridget [name] informed us she gets counseling at [employer] for [employer] ([address] [phone]).  She has a counseling appointment today.  Talking about her anxiety will hopefully allow some alleviation.  Will follow December 19th or as needed.

## 2011-08-10 NOTE — Patient Instructions (Signed)
Please schedule a follow-up for:  December 19th at 11:30.  I need to hear from you if you can not make this appointment [phone]). You decided that the medication has been effective and you think it would be good to restart - even before the surgery.  Dr. Kathrynn Running said it is fine to experiment with the timing of the medicine and whether you take it with or without food.  The most important thing is to take two pills (1000 mg) each day. Your blood level of the medicine when you were on it was 72.1 which is right where you need to be.

## 2011-08-10 NOTE — Progress Notes (Signed)
Bridget [name] presents after initial MDC and follow-up with PCP.  At the time of her appt with Dr. Cristal Ford, she was taking Depakote 1000 mg with no significant issues.  Her Valproic Acid level was 72.1.  Bridget [name] reports that 2.5 weeks ago she stopped taking the medicine.  Occasionally she has to pick her daughter up at work after 11:00.  If she took the medicine at 10:00 she would go to sleep only to have to awaken to get her daughter and then she was up.  She said this was the main reason she stopped taking it.  She did voice that she thinks she needs it and would like to restart prior to her scheduled hip replacement on Monday.  Bridget [name] also reports she was positive for MRSA.  This is very nerve wracking to her both from a health and hygiene standpoint.  Discussed.  Finally, she is very nervous about the surgery in general.

## 2011-08-11 ENCOUNTER — Other Ambulatory Visit: Payer: Self-pay | Admitting: Orthopedic Surgery

## 2011-08-14 MED ORDER — VANCOMYCIN HCL IN DEXTROSE 1-5 GM/200ML-% IV SOLN
1000.0000 mg | INTRAVENOUS | Status: DC
  Filled 2011-08-14: qty 200

## 2011-08-15 ENCOUNTER — Encounter (HOSPITAL_COMMUNITY): Payer: Self-pay | Admitting: Certified Registered Nurse Anesthetist

## 2011-08-15 ENCOUNTER — Inpatient Hospital Stay (HOSPITAL_COMMUNITY): Payer: Medicaid Other

## 2011-08-15 ENCOUNTER — Encounter (HOSPITAL_COMMUNITY): Payer: Self-pay | Admitting: *Deleted

## 2011-08-15 ENCOUNTER — Encounter (HOSPITAL_COMMUNITY)
Admission: RE | Disposition: A | Discharge status: Home / Self Care | Payer: Self-pay | Source: Ambulatory Visit | Attending: Orthopedic Surgery

## 2011-08-15 ENCOUNTER — Inpatient Hospital Stay (HOSPITAL_COMMUNITY)
Admission: RE | Admit: 2011-08-15 | Discharge: 2011-08-17 | DRG: 470 | Disposition: A | Discharge status: Home Health | Payer: Medicaid Other | Source: Ambulatory Visit | Attending: Orthopedic Surgery | Admitting: Orthopedic Surgery

## 2011-08-15 ENCOUNTER — Inpatient Hospital Stay (HOSPITAL_COMMUNITY): Payer: Medicaid Other | Admitting: Certified Registered Nurse Anesthetist

## 2011-08-15 DIAGNOSIS — F17200 Nicotine dependence, unspecified, uncomplicated: Secondary | ICD-10-CM | POA: Diagnosis present

## 2011-08-15 DIAGNOSIS — M1611 Unilateral primary osteoarthritis, right hip: Secondary | ICD-10-CM | POA: Diagnosis present

## 2011-08-15 DIAGNOSIS — F3130 Bipolar disorder, current episode depressed, mild or moderate severity, unspecified: Secondary | ICD-10-CM | POA: Diagnosis present

## 2011-08-15 DIAGNOSIS — M169 Osteoarthritis of hip, unspecified: Principal | ICD-10-CM | POA: Diagnosis present

## 2011-08-15 DIAGNOSIS — F3181 Bipolar II disorder: Secondary | ICD-10-CM

## 2011-08-15 DIAGNOSIS — M1610 Unilateral primary osteoarthritis, unspecified hip: Principal | ICD-10-CM | POA: Diagnosis present

## 2011-08-15 DIAGNOSIS — Z6841 Body Mass Index (BMI) 40.0 and over, adult: Secondary | ICD-10-CM

## 2011-08-15 HISTORY — PX: TOTAL HIP ARTHROPLASTY: SHX124

## 2011-08-15 LAB — TYPE AND SCREEN
ABO/RH(D): A POS
Antibody Screen: NEGATIVE

## 2011-08-15 LAB — ABO/RH: ABO/RH(D): A POS

## 2011-08-15 SURGERY — ARTHROPLASTY, HIP, TOTAL,POSTERIOR APPROACH
Anesthesia: General | Site: Hip | Laterality: Right | Wound class: Clean

## 2011-08-15 MED ORDER — METHOCARBAMOL 500 MG PO TABS: 500.0000 mg | ORAL_TABLET | Freq: Four times a day (QID) | ORAL | Status: DC | PRN

## 2011-08-15 MED ORDER — EPHEDRINE SULFATE 50 MG/ML IJ SOLN
INTRAMUSCULAR | Status: DC | PRN
  Administered 2011-08-15: 10 mg via INTRAVENOUS
  Administered 2011-08-15: 15 mg via INTRAVENOUS
  Administered 2011-08-15: 5 mg via INTRAVENOUS

## 2011-08-15 MED ORDER — POTASSIUM CHLORIDE IN NACL 20-0.45 MEQ/L-% IV SOLN
INTRAVENOUS | Status: DC
  Administered 2011-08-15 – 2011-08-16 (×2): via INTRAVENOUS
  Filled 2011-08-15 (×6): qty 1000

## 2011-08-15 MED ORDER — ZOLPIDEM TARTRATE 5 MG PO TABS: 5.0000 mg | ORAL_TABLET | Freq: Every evening | ORAL | Status: DC | PRN

## 2011-08-15 MED ORDER — CEFAZOLIN SODIUM-DEXTROSE 2-3 GM-% IV SOLR
2.0000 g | Freq: Once | INTRAVENOUS | Status: AC
  Administered 2011-08-15: 2 g via INTRAVENOUS
  Filled 2011-08-15: qty 50

## 2011-08-15 MED ORDER — ALUM & MAG HYDROXIDE-SIMETH 200-200-20 MG/5ML PO SUSP: 30.0000 mL | ORAL | Status: DC | PRN

## 2011-08-15 MED ORDER — BISACODYL 10 MG RE SUPP: 10.0000 mg | Freq: Every day | RECTAL | Status: DC | PRN

## 2011-08-15 MED ORDER — PROMETHAZINE HCL 25 MG PO TABS: 25.0000 mg | ORAL_TABLET | Freq: Four times a day (QID) | ORAL | Status: DC | PRN

## 2011-08-15 MED ORDER — WARFARIN SODIUM 10 MG PO TABS
10.0000 mg | ORAL_TABLET | Freq: Once | ORAL | Status: AC
  Administered 2011-08-15: 10 mg via ORAL
  Filled 2011-08-15: qty 1

## 2011-08-15 MED ORDER — METHOCARBAMOL 500 MG PO TABS: 500.0000 mg | ORAL_TABLET | Freq: Four times a day (QID) | ORAL | Status: DC

## 2011-08-15 MED ORDER — ROCURONIUM BROMIDE 100 MG/10ML IV SOLN
INTRAVENOUS | Status: DC | PRN
  Administered 2011-08-15: 50 mg via INTRAVENOUS
  Administered 2011-08-15 (×2): 10 mg via INTRAVENOUS

## 2011-08-15 MED ORDER — DEXTROSE 5 % IV SOLN
500.0000 mg | Freq: Four times a day (QID) | INTRAVENOUS | Status: DC | PRN
  Filled 2011-08-15: qty 5

## 2011-08-15 MED ORDER — HYDROMORPHONE 0.3 MG/ML IV SOLN
INTRAVENOUS | Status: DC
  Administered 2011-08-15: 0.3 mg via INTRAVENOUS
  Administered 2011-08-15: 2.7 mg via INTRAVENOUS
  Administered 2011-08-16: 0.9 mg via INTRAVENOUS
  Administered 2011-08-16: 2.1 mg via INTRAVENOUS
  Administered 2011-08-16: 2.4 mg via INTRAVENOUS
  Filled 2011-08-15: qty 25

## 2011-08-15 MED ORDER — DIPHENHYDRAMINE HCL 12.5 MG/5ML PO ELIX
12.5000 mg | ORAL_SOLUTION | Freq: Four times a day (QID) | ORAL | Status: DC | PRN
  Filled 2011-08-15: qty 5

## 2011-08-15 MED ORDER — METHOCARBAMOL 500 MG PO TABS: 500.0000 mg | ORAL_TABLET | Freq: Four times a day (QID) | ORAL | Status: AC

## 2011-08-15 MED ORDER — PATIENT'S GUIDE TO USING COUMADIN BOOK
Freq: Once | Status: AC
  Administered 2011-08-15: 21:00:00
  Filled 2011-08-15: qty 1

## 2011-08-15 MED ORDER — MUPIROCIN 2 % EX OINT
TOPICAL_OINTMENT | CUTANEOUS | Status: AC
  Administered 2011-08-15: 12:00:00
  Filled 2011-08-15: qty 22

## 2011-08-15 MED ORDER — DEXTROSE 5 % IV SOLN
500.0000 mg | Freq: Once | INTRAVENOUS | Status: DC
  Filled 2011-08-15: qty 5

## 2011-08-15 MED ORDER — HYDROMORPHONE HCL PF 1 MG/ML IJ SOLN
0.2500 mg | INTRAMUSCULAR | Status: DC | PRN
  Administered 2011-08-15 (×3): 0.5 mg via INTRAVENOUS

## 2011-08-15 MED ORDER — OXYCODONE HCL 5 MG PO TABS
5.0000 mg | ORAL_TABLET | ORAL | Status: DC | PRN
  Administered 2011-08-16: 10 mg via ORAL
  Filled 2011-08-15: qty 2

## 2011-08-15 MED ORDER — LABETALOL HCL 5 MG/ML IV SOLN
INTRAVENOUS | Status: DC | PRN
  Administered 2011-08-15 (×2): 5 mg via INTRAVENOUS

## 2011-08-15 MED ORDER — ONDANSETRON HCL 4 MG/2ML IJ SOLN: 4.0000 mg | Freq: Four times a day (QID) | INTRAMUSCULAR | Status: DC | PRN

## 2011-08-15 MED ORDER — BISACODYL 5 MG PO TBEC: 10.0000 mg | DELAYED_RELEASE_TABLET | Freq: Every day | ORAL | Status: DC | PRN

## 2011-08-15 MED ORDER — POLYETHYLENE GLYCOL 3350 17 G PO PACK
17.0000 g | PACK | Freq: Every day | ORAL | Status: DC | PRN
  Administered 2011-08-17: 17 g via ORAL
  Filled 2011-08-15: qty 1

## 2011-08-15 MED ORDER — PROPOFOL 10 MG/ML IV EMUL
INTRAVENOUS | Status: DC | PRN
  Administered 2011-08-15: 200 mg via INTRAVENOUS

## 2011-08-15 MED ORDER — SENNA 8.6 MG PO TABS
1.0000 | ORAL_TABLET | Freq: Two times a day (BID) | ORAL | Status: DC
  Administered 2011-08-15 – 2011-08-17 (×4): 8.6 mg via ORAL
  Filled 2011-08-15 (×7): qty 1

## 2011-08-15 MED ORDER — ACETAMINOPHEN 650 MG RE SUPP
650.0000 mg | Freq: Four times a day (QID) | RECTAL | Status: DC | PRN
  Filled 2011-08-15: qty 1

## 2011-08-15 MED ORDER — MAGNESIUM HYDROXIDE 400 MG/5ML PO SUSP: 30.0000 mL | Freq: Two times a day (BID) | ORAL | Status: DC | PRN

## 2011-08-15 MED ORDER — MENTHOL 3 MG MT LOZG: 1.0000 | LOZENGE | OROMUCOSAL | Status: DC | PRN

## 2011-08-15 MED ORDER — DIPHENHYDRAMINE HCL 12.5 MG/5ML PO ELIX
12.5000 mg | ORAL_SOLUTION | ORAL | Status: DC | PRN
Start: 2011-08-15 — End: 2011-08-17
  Filled 2011-08-15: qty 10

## 2011-08-15 MED ORDER — NEOSTIGMINE METHYLSULFATE 1 MG/ML IJ SOLN
INTRAMUSCULAR | Status: DC | PRN
  Administered 2011-08-15: 4 mg via INTRAVENOUS

## 2011-08-15 MED ORDER — WARFARIN SODIUM 5 MG PO TABS: 5.0000 mg | ORAL_TABLET | Freq: Every day | ORAL | Status: DC

## 2011-08-15 MED ORDER — OXYCODONE-ACETAMINOPHEN 5-325 MG PO TABS
1.0000 | ORAL_TABLET | ORAL | Status: DC | PRN
  Administered 2011-08-16 – 2011-08-17 (×6): 2 via ORAL
  Filled 2011-08-15 (×6): qty 2

## 2011-08-15 MED ORDER — PHENYLEPHRINE HCL 10 MG/ML IJ SOLN
INTRAMUSCULAR | Status: DC | PRN
  Administered 2011-08-15: 120 ug via INTRAVENOUS
  Administered 2011-08-15: 80 ug via INTRAVENOUS

## 2011-08-15 MED ORDER — METOCLOPRAMIDE HCL 5 MG PO TABS
5.0000 mg | ORAL_TABLET | Freq: Three times a day (TID) | ORAL | Status: DC | PRN
  Filled 2011-08-15: qty 2

## 2011-08-15 MED ORDER — ENOXAPARIN SODIUM 30 MG/0.3ML ~~LOC~~ SOLN
30.0000 mg | Freq: Two times a day (BID) | SUBCUTANEOUS | Status: DC
  Administered 2011-08-16 – 2011-08-17 (×3): 30 mg via SUBCUTANEOUS
  Filled 2011-08-15 (×5): qty 0.3

## 2011-08-15 MED ORDER — SODIUM CHLORIDE 0.9 % IJ SOLN: 9.0000 mL | INTRAMUSCULAR | Status: DC | PRN

## 2011-08-15 MED ORDER — PHENOL 1.4 % MT LIQD
1.0000 | OROMUCOSAL | Status: DC | PRN
  Filled 2011-08-15: qty 177

## 2011-08-15 MED ORDER — DOCUSATE SODIUM 100 MG PO CAPS
100.0000 mg | ORAL_CAPSULE | Freq: Two times a day (BID) | ORAL | Status: DC
  Administered 2011-08-15 – 2011-08-17 (×4): 100 mg via ORAL
  Filled 2011-08-15 (×5): qty 1

## 2011-08-15 MED ORDER — LACTATED RINGERS IV SOLN
INTRAVENOUS | Status: DC | PRN
  Administered 2011-08-15 (×3): via INTRAVENOUS

## 2011-08-15 MED ORDER — HYDROMORPHONE HCL PF 1 MG/ML IJ SOLN
0.5000 mg | INTRAMUSCULAR | Status: DC | PRN
  Administered 2011-08-15: 0.5 mg via INTRAVENOUS
  Administered 2011-08-15: 2.7 mg via INTRAVENOUS
  Administered 2011-08-16 – 2011-08-17 (×2): 1 mg via INTRAVENOUS
  Filled 2011-08-15 (×3): qty 1

## 2011-08-15 MED ORDER — ONDANSETRON HCL 4 MG/2ML IJ SOLN
INTRAMUSCULAR | Status: DC | PRN
  Administered 2011-08-15: 4 mg via INTRAVENOUS

## 2011-08-15 MED ORDER — HETASTARCH-ELECTROLYTES 6 % IV SOLN
INTRAVENOUS | Status: DC | PRN
  Administered 2011-08-15: 15:00:00 via INTRAVENOUS

## 2011-08-15 MED ORDER — METOCLOPRAMIDE HCL 5 MG/ML IJ SOLN
5.0000 mg | Freq: Three times a day (TID) | INTRAMUSCULAR | Status: DC | PRN
  Filled 2011-08-15: qty 2

## 2011-08-15 MED ORDER — OXYCODONE-ACETAMINOPHEN 10-325 MG PO TABS: 1.0000 | ORAL_TABLET | Freq: Four times a day (QID) | ORAL | Status: AC | PRN

## 2011-08-15 MED ORDER — HYDROMORPHONE HCL PF 1 MG/ML IJ SOLN
INTRAMUSCULAR | Status: AC
  Administered 2011-08-15: 2.7 mg via INTRAVENOUS
  Filled 2011-08-15: qty 1

## 2011-08-15 MED ORDER — SODIUM CHLORIDE 0.9 % IR SOLN
Status: DC | PRN
  Administered 2011-08-15: 1000 mL

## 2011-08-15 MED ORDER — CEFAZOLIN SODIUM 1-5 GM-% IV SOLN
1.0000 g | Freq: Four times a day (QID) | INTRAVENOUS | Status: AC
  Administered 2011-08-15 – 2011-08-16 (×3): 1 g via INTRAVENOUS
  Filled 2011-08-15 (×5): qty 50

## 2011-08-15 MED ORDER — DIVALPROEX SODIUM ER 500 MG PO TB24
1000.0000 mg | ORAL_TABLET | Freq: Every day | ORAL | Status: DC
  Administered 2011-08-15 – 2011-08-17 (×3): 1000 mg via ORAL
  Filled 2011-08-15 (×3): qty 2

## 2011-08-15 MED ORDER — ONDANSETRON HCL 4 MG PO TABS: 4.0000 mg | ORAL_TABLET | Freq: Four times a day (QID) | ORAL | Status: DC | PRN

## 2011-08-15 MED ORDER — LACTATED RINGERS IV SOLN
INTRAVENOUS | Status: DC
  Administered 2011-08-15: 13:00:00 via INTRAVENOUS

## 2011-08-15 MED ORDER — ONDANSETRON HCL 4 MG/2ML IJ SOLN: 4.0000 mg | Freq: Once | INTRAMUSCULAR | Status: DC | PRN

## 2011-08-15 MED ORDER — WARFARIN VIDEO
Freq: Once | Status: DC
Start: 2011-08-16 — End: 2011-08-17
  Filled 2011-08-15: qty 1

## 2011-08-15 MED ORDER — CYCLOBENZAPRINE HCL 10 MG PO TABS
10.0000 mg | ORAL_TABLET | Freq: Three times a day (TID) | ORAL | Status: DC | PRN
  Administered 2011-08-16 – 2011-08-17 (×2): 10 mg via ORAL
  Filled 2011-08-15 (×2): qty 1

## 2011-08-15 MED ORDER — HYDROMORPHONE HCL PF 1 MG/ML IJ SOLN
INTRAMUSCULAR | Status: AC
  Administered 2011-08-15: 0.5 mg via INTRAVENOUS
  Filled 2011-08-15: qty 1

## 2011-08-15 MED ORDER — DIPHENHYDRAMINE HCL 50 MG/ML IJ SOLN: 12.5000 mg | Freq: Four times a day (QID) | INTRAMUSCULAR | Status: DC | PRN

## 2011-08-15 MED ORDER — FENTANYL CITRATE 0.05 MG/ML IJ SOLN
INTRAMUSCULAR | Status: DC | PRN
  Administered 2011-08-15: 75 ug via INTRAVENOUS
  Administered 2011-08-15: 50 ug via INTRAVENOUS
  Administered 2011-08-15: 25 ug via INTRAVENOUS
  Administered 2011-08-15: 125 ug via INTRAVENOUS
  Administered 2011-08-15 (×2): 50 ug via INTRAVENOUS
  Administered 2011-08-15: 25 ug via INTRAVENOUS

## 2011-08-15 MED ORDER — NALOXONE HCL 0.4 MG/ML IJ SOLN: 0.4000 mg | INTRAMUSCULAR | Status: DC | PRN

## 2011-08-15 MED ORDER — FLEET ENEMA 7-19 GM/118ML RE ENEM: 1.0000 | ENEMA | Freq: Every day | RECTAL | Status: DC | PRN

## 2011-08-15 MED ORDER — GLYCOPYRROLATE 0.2 MG/ML IJ SOLN
INTRAMUSCULAR | Status: DC | PRN
  Administered 2011-08-15: .7 mg via INTRAVENOUS

## 2011-08-15 MED ORDER — ACETAMINOPHEN 325 MG PO TABS
650.0000 mg | ORAL_TABLET | Freq: Four times a day (QID) | ORAL | Status: DC | PRN
  Filled 2011-08-15: qty 2

## 2011-08-15 SURGICAL SUPPLY — 63 items
APL SKNCLS STERI-STRIP NONHPOA (GAUZE/BANDAGES/DRESSINGS) ×1
BENZOIN TINCTURE PRP APPL 2/3 (GAUZE/BANDAGES/DRESSINGS) ×2 IMPLANT
BLADE SAW SAG 73X25 THK (BLADE) ×1
BLADE SAW SGTL 73X25 THK (BLADE) ×1 IMPLANT
BRUSH FEMORAL CANAL (MISCELLANEOUS) IMPLANT
CLOTH BEACON ORANGE TIMEOUT ST (SAFETY) ×2 IMPLANT
COVER BACK TABLE 24X17X13 BIG (DRAPES) IMPLANT
COVER SURGICAL LIGHT HANDLE (MISCELLANEOUS) ×2 IMPLANT
DRAPE INCISE IOBAN 66X45 STRL (DRAPES) IMPLANT
DRAPE ORTHO SPLIT 77X108 STRL (DRAPES) ×4
DRAPE PROXIMA HALF (DRAPES) ×2 IMPLANT
DRAPE SURG ORHT 6 SPLT 77X108 (DRAPES) ×2 IMPLANT
DRAPE U-SHAPE 47X51 STRL (DRAPES) ×2 IMPLANT
DRILL BIT 5/64 (BIT) ×2 IMPLANT
DRSG MEPILEX BORDER 4X12 (GAUZE/BANDAGES/DRESSINGS) ×1 IMPLANT
DRSG MEPILEX BORDER 4X8 (GAUZE/BANDAGES/DRESSINGS) IMPLANT
DRSG PAD ABDOMINAL 8X10 ST (GAUZE/BANDAGES/DRESSINGS) ×4 IMPLANT
DURAPREP 26ML APPLICATOR (WOUND CARE) ×2 IMPLANT
ELECT CAUTERY BLADE 6.4 (BLADE) ×2 IMPLANT
ELECT REM PT RETURN 9FT ADLT (ELECTROSURGICAL) ×2
ELECTRODE REM PT RTRN 9FT ADLT (ELECTROSURGICAL) ×1 IMPLANT
EVACUATOR 1/8 PVC DRAIN (DRAIN) IMPLANT
GLOVE BIOGEL PI IND STRL 8 (GLOVE) ×1 IMPLANT
GLOVE BIOGEL PI INDICATOR 8 (GLOVE) ×1
GLOVE ORTHO TXT STRL SZ7.5 (GLOVE) ×2 IMPLANT
GLOVE SURG ORTHO 8.0 STRL STRW (GLOVE) ×4 IMPLANT
GOWN STRL NON-REIN LRG LVL3 (GOWN DISPOSABLE) IMPLANT
HANDPIECE INTERPULSE COAX TIP (DISPOSABLE)
HOOD PEEL AWAY FACE SHEILD DIS (HOOD) ×4 IMPLANT
KIT BASIN OR (CUSTOM PROCEDURE TRAY) ×2 IMPLANT
KIT ROOM TURNOVER OR (KITS) ×2 IMPLANT
MANIFOLD NEPTUNE II (INSTRUMENTS) ×2 IMPLANT
NDL HYPO 25GX1X1/2 BEV (NEEDLE) ×1 IMPLANT
NEEDLE HYPO 25GX1X1/2 BEV (NEEDLE) ×2 IMPLANT
NS IRRIG 1000ML POUR BTL (IV SOLUTION) ×2 IMPLANT
PACK TOTAL JOINT (CUSTOM PROCEDURE TRAY) ×2 IMPLANT
PAD ARMBOARD 7.5X6 YLW CONV (MISCELLANEOUS) ×2 IMPLANT
PILLOW ABDUCTION HIP (SOFTGOODS) ×2 IMPLANT
PRESSURIZER FEMORAL UNIV (MISCELLANEOUS) IMPLANT
RETRIEVER SUT HEWSON (MISCELLANEOUS) ×2 IMPLANT
SET HNDPC FAN SPRY TIP SCT (DISPOSABLE) IMPLANT
SPONGE GAUZE 4X4 12PLY (GAUZE/BANDAGES/DRESSINGS) ×2 IMPLANT
SPONGE LAP 4X18 X RAY DECT (DISPOSABLE) IMPLANT
STRIP CLOSURE SKIN 1/2X4 (GAUZE/BANDAGES/DRESSINGS) ×4 IMPLANT
SUCTION FRAZIER TIP 10 FR DISP (SUCTIONS) ×2 IMPLANT
SUT FIBERWIRE #2 38 REV NDL BL (SUTURE) ×6
SUT FIBERWIRE #2 38 T-5 BLUE (SUTURE)
SUT MNCRL AB 3-0 PS2 18 (SUTURE) ×2 IMPLANT
SUT VIC AB 0 CT1 27 (SUTURE) ×2
SUT VIC AB 0 CT1 27XBRD ANBCTR (SUTURE) ×1 IMPLANT
SUT VIC AB 1 CT1 27 (SUTURE) ×2
SUT VIC AB 1 CT1 27XBRD ANBCTR (SUTURE) ×1 IMPLANT
SUT VIC AB 2-0 CT1 27 (SUTURE) ×2
SUT VIC AB 2-0 CT1 TAPERPNT 27 (SUTURE) ×1 IMPLANT
SUT VIC AB 3-0 SH 8-18 (SUTURE) ×3 IMPLANT
SUTURE FIBERWR #2 38 T-5 BLUE (SUTURE) IMPLANT
SUTURE FIBERWR#2 38 REV NDL BL (SUTURE) ×3 IMPLANT
SYR CONTROL 10ML LL (SYRINGE) ×2 IMPLANT
TOWEL OR 17X24 6PK STRL BLUE (TOWEL DISPOSABLE) ×2 IMPLANT
TOWEL OR 17X26 10 PK STRL BLUE (TOWEL DISPOSABLE) ×2 IMPLANT
TOWER CARTRIDGE SMART MIX (DISPOSABLE) IMPLANT
TRAY FOLEY CATH 14FR (SET/KITS/TRAYS/PACK) ×2 IMPLANT
WATER STERILE IRR 1000ML POUR (IV SOLUTION) ×6 IMPLANT

## 2011-08-15 NOTE — Anesthesia Preprocedure Evaluation (Addendum)
Anesthesia Evaluation  Patient identified by MRN, date of birth, ID band Patient awake    Reviewed: Allergy & Precautions, H&P , NPO status , Patient's Chart, lab work & pertinent test results  History of Anesthesia Complications Negative for: history of anesthetic complications  Airway Mallampati: II TM Distance: >3 FB Neck ROM: full    Dental  (+) Dental Advisory Given   Pulmonary neg pulmonary ROS, Current Smoker (1/2 PPD X 20 years),    Pulmonary exam normal       Cardiovascular Exercise Tolerance: Poor regular Normal    Neuro/Psych PSYCHIATRIC DISORDERS Anxiety Bipolar Disorder Negative Neurological ROS  Negative Psych ROS   GI/Hepatic negative GI ROS, Neg liver ROS,   Endo/Other  Negative Endocrine ROSMorbid obesity  Renal/GU negative Renal ROS  Genitourinary negative   Musculoskeletal   Abdominal   Peds  Hematology   Anesthesia Other Findings   Reproductive/Obstetrics negative OB ROS                        Anesthesia Physical Anesthesia Plan  ASA: III  Anesthesia Plan: General   Post-op Pain Management:    Induction: Intravenous  Airway Management Planned: Oral ETT  Additional Equipment:   Intra-op Plan:   Post-operative Plan: Extubation in OR  Informed Consent: I have reviewed the patients History and Physical, chart, labs and discussed the procedure including the risks, benefits and alternatives for the proposed anesthesia with the patient or authorized representative who has indicated his/her understanding and acceptance.   Dental advisory given  Plan Discussed with: Anesthesiologist, CRNA and Surgeon  Anesthesia Plan Comments:        Anesthesia Quick Evaluation

## 2011-08-15 NOTE — Anesthesia Procedure Notes (Signed)
Procedure Name: Intubation Date/Time: 08/15/2011 1:48 PM Performed by: Shirlyn Goltz, TOM Pre-anesthesia Checklist: Patient identified, Emergency Drugs available, Suction available, Patient being monitored and Timeout performed Patient Re-evaluated:Patient Re-evaluated prior to inductionOxygen Delivery Method: Circle System Utilized Preoxygenation: Pre-oxygenation with 100% oxygen Intubation Type: IV induction Ventilation: Mask ventilation without difficulty and Oral airway inserted - appropriate to patient size Laryngoscope Size: Mac and 4 Grade View: Grade II Tube size: 7.5 mm Number of attempts: 1 Placement Confirmation: ETT inserted through vocal cords under direct vision,  positive ETCO2 and breath sounds checked- equal and bilateral Secured at: 21 cm Tube secured with: Tape Dental Injury: Teeth and Oropharynx as per pre-operative assessment

## 2011-08-15 NOTE — Transfer of Care (Signed)
Immediate Anesthesia Transfer of Care Note  Patient: Bridget [name]  Procedure(s) Performed:  TOTAL HIP ARTHROPLASTY  Patient Location: PACU  Anesthesia Type: General  Level of Consciousness: awake, alert , oriented and patient cooperative  Airway & Oxygen Therapy: Patient Spontanous Breathing and Patient connected to nasal cannula oxygen  Post-op Assessment: Report given to PACU RN, Post -op Vital signs reviewed and stable, Patient moving all extremities and Patient able to stick tongue midline  Post vital signs: Reviewed and stable  Complications: No apparent anesthesia complications

## 2011-08-15 NOTE — Preoperative (Signed)
Beta Blockers   Reason not to administer Beta Blockers:Not Applicable 

## 2011-08-15 NOTE — Brief Op Note (Signed)
08/15/2011  3:49 PM  PATIENT:  Bridget [name]  50 y.o. female  PRE-OPERATIVE DIAGNOSIS:  Degenerative arthritis, right hip  POST-OPERATIVE DIAGNOSIS:  Same  PROCEDURE:  TOTAL HIP ARTHROPLASTY  SURGEON:  Luberta Grabinski P  PHYSICIAN ASSISTANT: Janace Litten, OPA-C  ANESTHESIA:   General

## 2011-08-15 NOTE — H&P (Signed)
Bridget [name] is an 50 y.o. female.   Chief Complaint: Right hip pain HPI: A 50 year old woman who complains of progressive right hip pain that was not responsive to conservative management. She had end-stage degenerative disease on her right hip and elected for right total hip arthroplasty. She complained of progressive weakness as well as gait disturbance and pain located around the right groin that radiated down to the thigh.  Past Medical History  Diagnosis Date  . Depression   . Bipolar II disorder   . Osteoarthritis   . Tobacco abuse     Past Surgical History  Procedure Date  . Rotator cuff repair     BILATERAL  . Bone spurs     REMOVED FROM SHOULDERS  . Eye surgery     STY REMOVED LEFT EYE  1992    Family History  Problem Relation Age of Onset  . Bipolar disorder Sister    Social History:  reports that she has been smoking Cigarettes.  She has a 10 pack-year smoking history. She does not have any smokeless tobacco history on file. She reports that she does not drink alcohol or use illicit drugs.  Allergies:  Allergies  Allergen Reactions  . Oxycontin Itching  . Tramadol     Makes feet tingle    Medications Prior to Admission  Medication Dose Route Frequency Provider Last Rate Last Dose  . ceFAZolin (ANCEF) IVPB 2 g/50 mL premix  2 g Intravenous Once [employer]      . mupirocin (BACTROBAN) 2 % ointment           . DISCONTD: vancomycin (VANCOCIN) IVPB 1000 mg/200 mL premix  1,000 mg Intravenous 60 min Pre-Op Burtis Junes Devaeh Amadi       Medications Prior to Admission  Medication Sig Dispense Refill  . cyclobenzaprine (FLEXERIL) 10 MG tablet Take 10 mg by mouth 3 (three) times daily as needed.       Marland Kitchen HYDROcodone-acetaminophen (NORCO) 10-325 MG per tablet Take 1-2 tablets by mouth every 8 (eight) hours as needed. For pain      . diclofenac (VOLTAREN) 75 MG EC tablet Take 75 mg by mouth 2 (two) times daily.          Results for orders placed during the hospital  encounter of 08/15/11 (from the past 48 hour(s))  HCG, SERUM, QUALITATIVE     Status: Normal   Collection Time   08/15/11 11:44 AM      Component Value Range Comment   Preg, Serum NEGATIVE  NEGATIVE    TYPE AND SCREEN     Status: Normal   Collection Time   08/15/11 11:45 AM      Component Value Range Comment   ABO/RH(D) A POS      Antibody Screen NEG      Sample Expiration 08/18/2011     ABO/RH     Status: Normal   Collection Time   08/15/11 11:45 AM      Component Value Range Comment   ABO/RH(D) A POS      No results found.  Review of Systems  Musculoskeletal: Positive for joint pain.  All other systems reviewed and are negative.    Blood pressure 160/78, pulse 87, temperature 98 F (36.7 C), temperature source Oral, resp. rate 20, last menstrual period 07/15/2011, SpO2 100.00%. Physical Exam  Constitutional: She appears well-developed and well-nourished.  HENT:  Head: Normocephalic and atraumatic.  Eyes: EOM are normal.  Neck: Normal range of motion.  Respiratory: Effort normal.  GI: Soft.  Musculoskeletal: She exhibits no tenderness.       Right hip: She exhibits decreased range of motion and decreased strength.  Neurological: She is alert.  Skin: Skin is warm.  Psychiatric: She has a normal mood and affect.     Assessment/Plan Right hip osteoarthritis, end stage.  She has elected for surgical intervention.  The risks benefits and alternatives were discussed with the patient including but not limited to the risks of nonoperative treatment, versus surgical intervention including infection, bleeding, nerve injury, periprosthetic fracture, the need for revision surgery, dislocation, leg length discrepancy, blood clots, cardiopulmonary complications, morbidity, mortality, among others, and they were willing to proceed.  Predicted outcome is good, although there will be at least a six to nine month expected recovery.   Bridget [name] P 08/15/2011, 1:06 PM

## 2011-08-15 NOTE — Op Note (Signed)
08/15/2011  3:52 PM  PATIENT:  Bridget [name]  50 y.o. female  MRN: [id number]  PRE-OPERATIVE DIAGNOSIS:  Degenerative arthritis, right hip  POST-OPERATIVE DIAGNOSIS:  Degenerative arthritis, right hip  PROCEDURE:  Procedure(s): RIGHT TOTAL HIP ARTHROPLASTY  PREOPERATIVE INDICATIONS:  Bridget [name] is a 50 year old obese after American female with bipolar disorder who had complained of severe right hip pain. She failed conservative measures. She elected for total hip arthroplasty. She had end-stage degenerative disease.  The risks benefits and alternatives were discussed with the patient including but not limited to the risks of nonoperative treatment, versus surgical intervention including infection, bleeding, nerve injury, periprosthetic fracture, the need for revision surgery, dislocation, leg length discrepancy, blood clots, cardiopulmonary complications, morbidity, mortality, among others, and they were willing to proceed.  Predicted outcome is good, although there will be at least a six to nine month expected recovery.    OPERATIVE REPORT     SURGEON:  Teryl Lucy, MD    ASSISTANT:  Janace Litten, OPA-C  (Present throughout the entire procedure     and necessary for completion of procedure in a timely manner)     ANESTHESIA:  General    COMPLICATIONS:  None.      COMPONENTS:  Depuy [place] femoral stem size 2, size 32+9 femoral head, with a size 48 mm Pinnacle acetabular shell with a 10 lipped liner placed posteriorly    PROCEDURE IN DETAIL: The patient was met in the holding area and   identified.  The appropriate hip  was identified and marked at the operative site.   The patient was then transported to the OR  and   placed under general l anesthesia.  At that point, the patient was   placed in the lateral decubitus position with the operative side up and   secured to the operating room table and all bony prominences padded.      The operative lower extremity was  prepped from the iliac crest to the distal   leg.  Sterile draping was performed.  Time out was performed prior to incision.       A routine posterolateral approach was utilized via sharp dissection   carried down to the subcutaneous tissue.  Gross bleeders were Bovie   coagulated.  The iliotibial band was quickly identified and incised   along the length of the skin incision.  Self-retaining retractors were   inserted.  With the hip internally rotated, the short external rotators   were identified. The piriformis was tagged with FiberWire, and the hip capsule released in a T-type fashion.  The femoral neck was exposed, and I resected the femoral neck using the appropriate jig. This was performed at approximately slightly less than a thumb's breadth above the lesser trochanter.    I then exposed the deep acetabulum, cleared out any tissue including the ligamentum teres, and included the hip capsule in the FiberWire used above and below the T.  Deep retractors were placed including a winging. Due to her obesity, exposure was fairly challenging, but I did have adequate exposure, and excised the labrum. Sequential reaming was performed from a 43 up to a size 47. I trialed, and the 48 trial did not seat all the way down as would be expected, and I placed a 48 mm shell, and this had excellent seating. Bone quality was excellent. Version and inclination was matched using the jig.    I then prepared the proximal femur using the cookie-cutter, the lateralizing reamer, and  then sequentially broached. I did ream up to a 3, and broached, however she was very tight with a small canal, and so a size 2 was selected. I had slightly more version then anatomic, and had a full canal fill.  I reduced the hip and it was found to have excellent stability with functional range of motion. I initially started with a +1, but does have a lot of shuck, and worked my way up to a +9. The +9 may have been slightly longer than  her normal anatomy, however this provided optimal stability. The trial components were then removed.   I then removed all the trial components, and placed my real acetabular polyethylene liner with the lip placed posteriorly, and then turned my attention back to the femur and impacted the femur in place. Placed the real head ball, reduced the hip, and it was found to have excellent alignment, and full range of motion with excellent stability.  With all implants in, I then used a 2 mm drill bits to pass the FiberWire suture from the capsule and puriform is through the greater trochanter, and secured this.  Posterior capsular repair was fair.  I then irrigated the hip copiously again with pulse lavage, and repaired the fascia with Vicryl, followed by Vicryl for the subcutaneous tissue, Monocryl for the skin, Steri-Strips and sterile gauze. The wounds were injected. The patient was then awakened and returned to PACU in stable and satisfactory condition. No complications.  Teryl Lucy, MD Orthopedic Surgeon [phone]   08/15/2011 3:52 PM

## 2011-08-15 NOTE — Progress Notes (Signed)
ANTICOAGULATION CONSULT NOTE - Initial Consult  Pharmacy Consult for  Couamdin  Indication: VTE prophylaxis s/p right THA  Allergies  Allergen Reactions  . Oxycontin Itching  . Tramadol     Makes feet tingle    Patient Measurements:   Weight: 92.3 kg Height:  5 feet   Labs: Pre-op labs 11/6:  PT = 12.7 sec/INR 0.93.                               H/H 12.4/37.5  PLTC 267K                               Scr 0.72, LFTs normal, albumin 3.7  Medical History: Past Medical History  Diagnosis Date  . Depression   . Bipolar II disorder   . Osteoarthritis   . Tobacco abuse   . Obesity, morbid     Medications:    Among home meds Depakote ER 1000 mg q24h - not ordered yet  Assessment:    Post-op right THA, to begin Coumadin tonight  Goal of Therapy:  INR 2-3   Plan:    Couamdin 10mg  PO tonight.   Daily PT/INR to begin in AM.   Coumadin education prior to discharge.   Will schedule Lovenox 30 mg SQ q12h to begin at [facility] 11/13.  Lovenox to continue until INR > or = 1.8.    Resume home Depakote?   Dennie Fetters, RPh  08/15/2011,7:49 PM

## 2011-08-16 ENCOUNTER — Inpatient Hospital Stay (HOSPITAL_COMMUNITY): Payer: Medicaid Other

## 2011-08-16 LAB — PROTIME-INR: INR: 1.07 (ref 0.00–1.49)

## 2011-08-16 LAB — BASIC METABOLIC PANEL
BUN: 6 mg/dL (ref 6–23)
CO2: 23 mEq/L (ref 19–32)
Calcium: 8.3 mg/dL — ABNORMAL LOW (ref 8.4–10.5)
Creatinine, Ser: 0.59 mg/dL (ref 0.50–1.10)

## 2011-08-16 LAB — CBC
HCT: 25.6 % — ABNORMAL LOW (ref 36.0–46.0)
MCH: 29 pg (ref 26.0–34.0)
MCV: 86.2 fL (ref 78.0–100.0)
Platelets: 200 10*3/uL (ref 150–400)
RBC: 2.97 MIL/uL — ABNORMAL LOW (ref 3.87–5.11)
RDW: 14.1 % (ref 11.5–15.5)

## 2011-08-16 NOTE — Anesthesia Postprocedure Evaluation (Signed)
  Anesthesia Post-op Note  Patient: Bridget [name]  Procedure(s) Performed:  TOTAL HIP ARTHROPLASTY  Patient Location: PACU and Nursing Unit  Anesthesia Type: General  Level of Consciousness: awake, alert  and oriented  Airway and Oxygen Therapy: Patient Spontanous Breathing  Post-op Pain: none  Post-op Assessment: Post-op Vital signs reviewed and Patient's Cardiovascular Status Stable  Post-op Vital Signs: Reviewed and stable  Complications: No apparent anesthesia complications

## 2011-08-16 NOTE — Progress Notes (Signed)
ANTICOAGULATION CONSULT NOTE - Initial Consult  Pharmacy Consult for Coumadin Indication: VTE prophylaxis  Allergies  Allergen Reactions  . Oxycontin Itching  . Tramadol     Makes feet tingle    Patient Measurements: Height: 5' (152.4 cm) Weight: 205 lb (92.987 kg) IBW/kg (Calculated) : 45.5    Vital Signs: Temp: 99.9 F (37.7 C) (11/13 0544) Temp src: Oral (11/13 0544) BP: 106/61 mmHg (11/13 0544) Pulse Rate: 103  (11/13 0544)  Labs:  Basename 08/16/11 0540  HGB 8.6*  HCT 25.6*  PLT 200  APTT --  LABPROT 14.1  INR 1.07  HEPARINUNFRC --  CREATININE 0.59  CKTOTAL --  CKMB --  TROPONINI --   Estimated Creatinine Clearance: 86.6 ml/min (by C-G formula based on Cr of 0.59).  Medical History: Past Medical History  Diagnosis Date  . Depression   . Bipolar II disorder   . Osteoarthritis   . Tobacco abuse   . Obesity, morbid     Medications:  Prescriptions prior to admission  Medication Sig Dispense Refill  . cyclobenzaprine (FLEXERIL) 10 MG tablet Take 10 mg by mouth 3 (three) times daily as needed.       . divalproex (DEPAKOTE ER) 500 MG 24 hr tablet Take 2 tablets (1,000 mg total) by mouth daily. Needs appointment prior to refills  60 tablet  5  . DISCONTD: HYDROcodone-acetaminophen (NORCO) 10-325 MG per tablet Take 1-2 tablets by mouth every 8 (eight) hours as needed. For pain      . DISCONTD: diclofenac (VOLTAREN) 75 MG EC tablet Take 75 mg by mouth 2 (two) times daily.         Depakote ER 1000mg  daily reordered 08/15/11 PM.  Assessment: 50yo female POD#1 sp Rt. THA. Couamdin for DVT prophylaxis and Lovenox 30mg  sq q12h. INR today= 1.07.  Hgb 8.6, no bleeding reported. Goal of Therapy:  INR 2-3   Plan: Coumadin 7.5mg  po tonight x 1. INR daily   Bridget [name] 08/16/2011,11:43 AM

## 2011-08-16 NOTE — Progress Notes (Signed)
Occupational Therapy Evaluation Patient Details Name: Bridget [name] MRN: [id number] DOB: July 24, 1961 Today's Date: 08/16/2011  Problem List:  Patient Active Problem List  Diagnoses  . OBESITY, NOS  . TOBACCO DEPENDENCE  . DEPRESSIVE DISORDER, NOS  . HIP PAIN, RIGHT, CHRONIC  . OSTEOARTHRITIS OF SPINE, NOS  . UNSPECIFIED SLEEP DISTURBANCE  . INCONTINENCE, URGE  . BACK PAIN, CHRONIC  . Plantar wart  . Mania  . Bipolar 2 disorder  . Pruritic rash  . Finger pain  . Obesity, morbid  . Primary osteoarthritis of right hip    Past Medical History:  Past Medical History  Diagnosis Date  . Depression   . Bipolar II disorder   . Osteoarthritis   . Tobacco abuse   . Obesity, morbid    Past Surgical History:  Past Surgical History  Procedure Date  . Rotator cuff repair     BILATERAL  . Bone spurs     REMOVED FROM SHOULDERS  . Eye surgery     STY REMOVED LEFT EYE  1992    OT Assessment/Plan/Recommendation OT Assessment Clinical Impression Statement: Pt will benefit from OT services in the acute care setting to increase I with ADLs and to increase I with functional transfers in prep for d/c home with daughter and son. OT Recommendation/Assessment: Patient will need skilled OT in the acute care venue OT Problem List: Decreased activity tolerance;Decreased knowledge of use of DME or AE;Decreased knowledge of precautions;Pain OT Therapy Diagnosis : Acute pain OT Plan OT Frequency: Min 2X/week OT Treatment/Interventions: Self-care/ADL training;DME and/or AE instruction;Therapeutic activities;Patient/family education OT Recommendation Follow Up Recommendations: None Equipment Recommended: Tub/shower bench Individuals Consulted Consulted and Agree with Results and Recommendations: Patient OT Goals Acute Rehab OT Goals OT Goal Formulation: With patient/family Time For Goal Achievement: 7 days ADL Goals Pt Will Perform Grooming: with modified independence;Standing at  sink ADL Goal: Grooming - Progress: Other (comment) Pt Will Perform Lower Body Bathing: with modified independence;Sit to stand from chair;with adaptive equipment ADL Goal: Lower Body Bathing - Progress: Other (comment) Pt Will Perform Lower Body Dressing: with modified independence;Sit to stand from chair;with adaptive equipment ADL Goal: Lower Body Dressing - Progress: Other (comment) Pt Will Transfer to Toilet: with modified independence;Stand pivot transfer;with DME;3-in-1 ADL Goal: Toilet Transfer - Progress: Other (comment) Pt Will Perform Toileting - Clothing Manipulation: with modified independence;Standing ADL Goal: Toileting - Clothing Manipulation - Progress: Other (comment) Pt Will Perform Toileting - Hygiene: with modified independence;Sit to stand from 3-in-1/toilet ADL Goal: Toileting - Hygiene - Progress: Other (comment)  OT Evaluation Precautions/Restrictions  Precautions Precautions: Posterior Hip Precaution Booklet Issued: Yes (comment) Restrictions Weight Bearing Restrictions: Yes RLE Weight Bearing: Weight bearing as tolerated Prior Functioning Home Living Lives With: Family Receives Help From: Family Type of Home: Apartment Home Layout: One level Home Access: Level entry Bathroom Shower/Tub: Engineer, manufacturing systems: Standard Home Adaptive Equipment: None Prior Function Level of Independence: Independent with basic ADLs ADL ADL Eating/Feeding: Simulated;Independent Where Assessed - Eating/Feeding: Chair Grooming: Simulated;Independent Where Assessed - Grooming: Sitting, chair Upper Body Bathing: Simulated;Set up Upper Body Bathing Details (indicate cue type and reason): Setup assist to gather bathing materials Where Assessed - Upper Body Bathing: Sitting, chair Lower Body Bathing: Simulated;Moderate assistance Lower Body Bathing Details (indicate cue type and reason): Mod assist due to posterior hip precautions. Where Assessed - Lower Body  Bathing: Sit to stand from chair Upper Body Dressing: Simulated;Independent Where Assessed - Upper Body Dressing: Sitting, chair Lower Body Dressing: Simulated;Moderate assistance  Lower Body Dressing Details (indicate cue type and reason): Mod assist due to posterior hip precautions. Where Assessed - Lower Body Dressing: Sit to stand from chair Toilet Transfer: Performed;Moderate assistance Toilet Transfer Method: Stand pivot Acupuncturist: Bedside commode Toileting - Clothing Manipulation: Simulated;Minimal assistance Where Assessed - Glass blower/designer Manipulation: Standing Toileting - Hygiene: Performed;Maximal assistance Where Assessed - Toileting Hygiene: Standing Tub/Shower Transfer: Not assessed Tub/Shower Transfer Method: Not assessed Equipment Used: Rolling walker Vision/Perception    Cognition Cognition Arousal/Alertness: Awake/alert Overall Cognitive Status: Appears within functional limits for tasks assessed Orientation Level: Oriented X4 Sensation/Coordination   Extremity Assessment RUE Assessment RUE Assessment: Within Functional Limits LUE Assessment LUE Assessment: Within Functional Limits Mobility  Bed Mobility Bed Mobility: No Transfers Transfers: Yes Sit to Stand: 3: Mod assist;From chair/3-in-1 Stand to Sit: 4: Min assist;With armrests;To chair/3-in-1 Exercises   End of Session OT - End of Session Equipment Utilized During Treatment: Gait belt Activity Tolerance: Patient limited by pain Patient left: in chair;with call bell in reach;with family/visitor present General Behavior During Session: [facility] for tasks performed Cognition: [facility] for tasks performed   Cipriano Mile 08/16/2011, 3:22 PM  08/16/2011 Cipriano Mile OTR/L Pager [phone] Office [phone]

## 2011-08-16 NOTE — Progress Notes (Signed)
Physical Therapy Treatment Patient Details Name: Bridget [name] MRN: [id number] DOB: [birth year] Today's Date: 08/16/2011  PT Assessment/Plan  PT - Assessment/Plan Comments on Treatment Session: slow but steady progress with amb and transfers PT Plan: Discharge plan remains appropriate PT Frequency: 7X/week Follow Up Recommendations: Home health PT Equipment Recommended: Rolling walker with 5" wheels (wide);3 in 1 bedside comode (wide);Tub/shower bench  PT Goals  Acute Rehab PT Goals PT Goal Formulation: With patient Time For Goal Achievement: 7 days PT Goal: Supine/Side to Sit - Progress: Other (comment) PT Goal: Sit to Supine/Side - Progress: Other (comment) PT Transfer Goal: Sit to Stand/Stand to Sit - Progress: Progressing toward goal PT Goal: Ambulate - Progress: Progressing toward goal PT Goal: Perform Home Exercise Program - Progress: Other (comment)  PT Treatment Precautions/Restrictions  Precautions Precautions: Posterior Hip Precaution Booklet Issued: Yes (comment) Restrictions Weight Bearing Restrictions: Yes RLE Weight Bearing: Weight bearing as tolerated Mobility (including Balance) Bed Mobility Bed Mobility: No Supine to Sit: Not tested (comment) (in chair upon arrival and opted to finish session in chair) Sitting - Scoot to Edge of Bed: Not tested (comment) (in chair upon arrival and opted to finish session in chair) Transfers Sit to Stand: 4: Min assist;From chair/3-in-1;With armrests (2 reps, from recliner and 3in1) Sit to Stand Details (indicate cue type and reason): cues to preposition for post hip prec Stand to Sit: 4: Min assist;With armrests;To chair/3-in-1 Stand to Sit Details: cues for control, safe hand placement, and post hip prec Ambulation/Gait Ambulation/Gait Assistance: 4: Min assist;Other (comment) (second person to push chair behind for safety) Ambulation/Gait Assistance Details (indicate cue type and reason): cues for sequence, to incr R  step length, and pay particular attention to post hip prec with turns Ambulation Distance (Feet): 30 Feet Assistive device: Rolling walker Gait Pattern: Step-to pattern;Decreased step length - left    Exercise  Focused on functional mobility this session Total Joint Exercises Ankle Circles/Pumps: Other (comment) (NT) Quad Sets:  (NT) Gluteal Sets:  (NT) Heel Slides:  (NT) Hip ABduction/ADduction:  (NT) End of Session PT - End of Session Equipment Utilized During Treatment: Gait belt Activity Tolerance: Patient limited by fatigue Patient left: in chair;with call bell in reach;with family/visitor present Nurse Communication: Mobility status for ambulation General Behavior During Session: [facility] for tasks performed Cognition: [facility] for tasks performed [place], [place] [phone]  Van Clines [facility] 08/16/2011, 4:25 PM

## 2011-08-16 NOTE — Progress Notes (Signed)
Physical Therapy Evaluation Patient Details Name: Bridget [name] MRN: [id number] DOB: 29-Sep-1961 Today's Date: 08/16/2011  Problem List:  Patient Active Problem List  Diagnoses  . OBESITY, NOS  . TOBACCO DEPENDENCE  . DEPRESSIVE DISORDER, NOS  . HIP PAIN, RIGHT, CHRONIC  . OSTEOARTHRITIS OF SPINE, NOS  . UNSPECIFIED SLEEP DISTURBANCE  . INCONTINENCE, URGE  . BACK PAIN, CHRONIC  . Plantar wart  . Mania  . Bipolar 2 disorder  . Pruritic rash  . Finger pain  . Obesity, morbid  . Primary osteoarthritis of right hip    Past Medical History:  Past Medical History  Diagnosis Date  . Depression   . Bipolar II disorder   . Osteoarthritis   . Tobacco abuse   . Obesity, morbid    Past Surgical History:  Past Surgical History  Procedure Date  . Rotator cuff repair     BILATERAL  . Bone spurs     REMOVED FROM SHOULDERS  . Eye surgery     STY REMOVED LEFT EYE  1992    PT Assessment/Plan/Recommendation PT Assessment Clinical Impression Statement: 50yo female s/p R THA presents with decr functional moblitiy secondary to pain, decr knowlledge of motion restrictions, decr activity tol; will benefit from acute PT to maximize I and safety with mobility, transfers, amb, pt education, to enable safe dc home with family assist; Pt very anxious with moving, but overall did well PT Recommendation/Assessment: Patient will need skilled PT in the acute care venue PT Problem List: Decreased strength;Decreased range of motion;Decreased activity tolerance;Decreased balance;Decreased mobility;Decreased knowledge of use of DME;Decreased knowledge of precautions;Obesity;Pain PT Therapy Diagnosis : Difficulty walking;Abnormality of gait;Acute pain PT Plan PT Frequency: 7X/week PT Treatment/Interventions: DME instruction;Gait training;Functional mobility training;Therapeutic exercise;Patient/family education PT Recommendation Recommendations for Other Services: OT consult Follow Up  Recommendations: Home health PT Equipment Recommended: Rolling walker with 5" wheels;3 in 1 bedside comode (Wide) PT Goals  Acute Rehab PT Goals PT Goal Formulation: With patient Time For Goal Achievement: 7 days Pt will go Supine/Side to Sit: with supervision (correctly maintaining Post hip prec) PT Goal: Supine/Side to Sit - Progress: Progressing toward goal Pt will go Sit to Supine/Side: with supervision (correctly maintaining Post hip prec) PT Goal: Sit to Supine/Side - Progress: Other (comment) Pt will Transfer Sit to Stand/Stand to Sit: with supervision (correctly maintaining Post hip prec) PT Transfer Goal: Sit to Stand/Stand to Sit - Progress: Progressing toward goal Pt will Ambulate: >150 feet;with supervision;with rolling walker (correctly maintaining Post hip prec) PT Goal: Ambulate - Progress: Progressing toward goal Pt will Perform Home Exercise Program: with supervision, verbal cues required/provided (and correctly verbalize and utilize pos hip rec) PT Goal: Perform Home Exercise Program - Progress: Other (comment)  PT Evaluation Precautions/Restrictions  Precautions Precautions: Posterior Hip Precaution Booklet Issued: Yes (comment) (post prec sheet posted in room ) Restrictions Weight Bearing Restrictions: Yes RLE Weight Bearing: Weight bearing as tolerated Prior Functioning  Home Living Lives With: Family Receives Help From: Family Type of Home: Apartment Home Layout: One level Home Access: Level entry Firefighter: Standard Home Adaptive Equipment: None Prior Function Level of Independence: Independent with basic ADLs Cognition Cognition Arousal/Alertness: Awake/alert Overall Cognitive Status: Appears within functional limits for tasks assessed Orientation Level: Oriented X4 Sensation/Coordination Coordination Gross Motor Movements are Fluid and Coordinated: Yes Fine Motor Movements are Fluid and Coordinated: Yes Extremity Assessment RUE  Assessment RUE Assessment: Within Functional Limits LUE Assessment LUE Assessment: Within Functional Limits RLE Assessment RLE Assessment: Exceptions to [facility]  RLE Strength RLE Overall Strength Comments: decr A/ARO and strength limited by pain postop LLE Assessment LLE Assessment: Within Functional Limits Mobility (including Balance) Bed Mobility Bed Mobility: Yes Supine to Sit: 3: Mod assist;HOB flat Supine to Sit Details (indicate cue type and reason): cues for post hip prec Sitting - Scoot to Edge of Bed: 4: Min assist Sitting - Scoot to [place] of Bed Details (indicate cue type and reason): cues for post hip prec Transfers Transfers: Yes Sit to Stand: 3: Mod assist;From bed Sit to Stand Details (indicate cue type and reason): cues for safety, post hip prec, hand placement Stand to Sit: 4: Min assist;With armrests;To chair/3-in-1 Stand to Sit Details: cues for safety, control, optimal positioning for post hip prec, hand placement Ambulation/Gait Ambulation/Gait: Yes Ambulation/Gait Assistance: 4: Min assist;Other (comment) (second person for IV) Ambulation/Gait Assistance Details (indicate cue type and reason): cues for sequence, wbat, self monitor for dizziness Ambulation Distance (Feet): 5 Feet Assistive device: Rolling walker Gait Pattern: Step-to pattern Stairs: No Wheelchair Mobility Wheelchair Mobility: No  Posture/Postural Control Posture/Postural Control: No significant limitations Balance Balance Assessed: No Exercise  Total Joint Exercises Ankle Circles/Pumps: AROM;15 reps Quad Sets: AROM;10 reps Gluteal Sets: AROM;5 reps Heel Slides: AAROM;5 reps Hip ABduction/ADduction: AAROM;5 reps End of Session PT - End of Session Equipment Utilized During Treatment: Gait belt Activity Tolerance: Other (comment) (reports of dizzines with upright activity, subsided quickly ) Patient left: in chair;with call bell in reach;with family/visitor present Nurse Communication:  Mobility status for ambulation General Behavior During Session: [facility] for tasks performed Cognition: [facility] for tasks performed [place], [place] [phone]  08/16/2011, 12:22 PM

## 2011-08-16 NOTE — Progress Notes (Signed)
Subjective: 1 Day Post-Op Procedure(s) (LRB): TOTAL HIP ARTHROPLASTY (Right) Patient reports pain as 5 on 0-10 scale.    Objective: Vital signs in last 24 hours: Temp:  [97 F (36.1 C)-99.9 F (37.7 C)] 99.9 F (37.7 C) (11/13 0544) Pulse Rate:  [87-103] 103  (11/13 0544) Resp:  [18-20] 18  (11/13 0800) BP: (100-160)/(58-78) 106/61 mmHg (11/13 0544) SpO2:  [97 %-100 %] 97 % (11/13 0544) Weight:  [92.987 kg (205 lb)] 205 lb (92.987 kg) (11/12 1900)  Intake/Output from previous day: 11/12 0701 - 11/13 0700 In: 4475 [I.V.:3875; IV Piggyback:600] Out: 3100 [Urine:2500; Blood:600] Intake/Output this shift:     [facility] 08/16/11 0540  HGB 8.6*    Basename 08/16/11 0540  WBC 9.5  RBC 2.97*  HCT 25.6*  PLT 200    Basename 08/16/11 0540  NA 133*  K 3.7  CL 101  CO2 23  BUN 6  CREATININE 0.59  GLUCOSE 114*  CALCIUM 8.3*    Basename 08/16/11 0540  LABPT --  INR 1.07    Neurologically intact Neurovascular intact Sensation intact distally Dorsiflexion/Plantar flexion intact Incision: dressing C/D/I  Assessment/Plan: 1 Day Post-Op Procedure(s) (LRB): TOTAL HIP ARTHROPLASTY (Right) Advance diet Up with therapy D/C IV fluids Discharge home with home health DC PCA and transition to orals.  Bridget [name] P 08/16/2011, 10:39 AM

## 2011-08-17 LAB — CBC
Hemoglobin: 8.4 g/dL — ABNORMAL LOW (ref 12.0–15.0)
MCH: 28.5 pg (ref 26.0–34.0)
MCHC: 33.2 g/dL (ref 30.0–36.0)
MCV: 85.8 fL (ref 78.0–100.0)

## 2011-08-17 LAB — PROTIME-INR: Prothrombin Time: 19.4 seconds — ABNORMAL HIGH (ref 11.6–15.2)

## 2011-08-17 MED ORDER — ENOXAPARIN SODIUM 30 MG/0.3ML ~~LOC~~ SOLN: 40.0000 mg | SUBCUTANEOUS | Status: DC

## 2011-08-17 MED ORDER — WARFARIN SODIUM 5 MG PO TABS
5.0000 mg | ORAL_TABLET | Freq: Once | ORAL | Status: DC
  Filled 2011-08-17: qty 1

## 2011-08-17 NOTE — Discharge Summary (Signed)
Physician Discharge Summary  Patient ID: Bridget [name] MRN: [id number] DOB/AGE: [birth year] 49 y.o.  Admit date: 08/15/2011 Discharge date: 08/17/2011  Admission Diagnoses:  Principal Problem:  *Primary osteoarthritis of right hip   Discharge Diagnoses:  Same  Past Medical History  Diagnosis Date  . Depression   . Bipolar II disorder   . Osteoarthritis   . Tobacco abuse   . Obesity, morbid     Surgeries: Procedure(s): TOTAL HIP ARTHROPLASTY on 08/15/2011   Consultants:  none  Discharged Condition: Improved  Hospital Course: Bridget [name] is an 50 y.o. female who was admitted 08/15/2011 with a chief complaint of right hip arthritis, and with a diagnosis of Primary osteoarthritis of right hip.  They were brought to the operating room on 08/15/2011 and underwent the above named procedures.    They were given perioperative antibiotics:  Anti-infectives     Start     Dose/Rate Route Frequency Ordered Stop   08/15/11 1800   ceFAZolin (ANCEF) IVPB 1 g/50 mL premix        1 g 100 mL/hr over 30 Minutes Intravenous Every 6 hours 08/15/11 1658 08/16/11 0721   08/15/11 1230   ceFAZolin (ANCEF) IVPB 2 g/50 mL premix     Comments: Give within 1 hour preop. If allergic to PCN use Vancomycin 1 GM IV.      2 g 100 mL/hr over 30 Minutes Intravenous  Once 08/15/11 1221 08/15/11 1340   08/14/11 1330   vancomycin (VANCOCIN) IVPB 1000 mg/200 mL premix  Status:  Discontinued        1,000 mg 200 mL/hr over 60 Minutes Intravenous 60 min pre-op 08/14/11 1318 08/15/11 1221        .  They were given sequential compression devices, early ambulation, and chemoprophylaxis for DVT prophylaxis.  They benefited maximally from their hospital stay and there were no complications.    Recent vital signs:  Filed Vitals:   08/17/11 0534  BP: 117/75  Pulse: 113  Temp: 99.2 F (37.3 C)  Resp: 18    Recent laboratory studies:  Results for orders placed during the hospital encounter  of 08/15/11  HCG, SERUM, QUALITATIVE      Component Value Range   Preg, Serum NEGATIVE  NEGATIVE   TYPE AND SCREEN      Component Value Range   ABO/RH(D) A POS     Antibody Screen NEG     Sample Expiration 08/18/2011    ABO/RH      Component Value Range   ABO/RH(D) A POS    PROTIME-INR      Component Value Range   Prothrombin Time 14.1  11.6 - 15.2 (seconds)   INR 1.07  0.00 - 1.49   CBC      Component Value Range   WBC 9.5  4.0 - 10.5 (K/uL)   RBC 2.97 (*) 3.87 - 5.11 (MIL/uL)   Hemoglobin 8.6 (*) 12.0 - 15.0 (g/dL)   HCT 40.9 (*) 81.1 - 46.0 (%)   MCV 86.2  78.0 - 100.0 (fL)   MCH 29.0  26.0 - 34.0 (pg)   MCHC 33.6  30.0 - 36.0 (g/dL)   RDW 91.4  78.2 - 95.6 (%)   Platelets 200  150 - 400 (K/uL)  BASIC METABOLIC PANEL      Component Value Range   Sodium 133 (*) 135 - 145 (mEq/L)   Potassium 3.7  3.5 - 5.1 (mEq/L)   Chloride 101  96 - 112 (mEq/L)  CO2 23  19 - 32 (mEq/L)   Glucose, Bld 114 (*) 70 - 99 (mg/dL)   BUN 6  6 - 23 (mg/dL)   Creatinine, Ser 1.61  0.50 - 1.10 (mg/dL)   Calcium 8.3 (*) 8.4 - 10.5 (mg/dL)   GFR calc non Af Amer >90  >90 (mL/min)   GFR calc Af Amer >90  >90 (mL/min)  PROTIME-INR      Component Value Range   Prothrombin Time 19.4 (*) 11.6 - 15.2 (seconds)   INR 1.61 (*) 0.00 - 1.49   CBC      Component Value Range   WBC 15.2 (*) 4.0 - 10.5 (K/uL)   RBC 2.95 (*) 3.87 - 5.11 (MIL/uL)   Hemoglobin 8.4 (*) 12.0 - 15.0 (g/dL)   HCT 09.6 (*) 04.5 - 46.0 (%)   MCV 85.8  78.0 - 100.0 (fL)   MCH 28.5  26.0 - 34.0 (pg)   MCHC 33.2  30.0 - 36.0 (g/dL)   RDW 40.9  81.1 - 91.4 (%)   Platelets 200  150 - 400 (K/uL)    Discharge Medications:   Current Discharge Medication List    START taking these medications   Details  methocarbamol (ROBAXIN) 500 MG tablet Take 1 tablet (500 mg total) by mouth 4 (four) times daily. Qty: 75 tablet, Refills: 1    oxyCODONE-acetaminophen (PERCOCET) 10-325 MG per tablet Take 1-2 tablets by mouth every 6 (six)  hours as needed for pain. MAXIMUM TOTAL ACETAMINOPHEN DOSE IS 4000 MG PER DAY Qty: 75 tablet, Refills: 0    promethazine (PHENERGAN) 25 MG tablet Take 1 tablet (25 mg total) by mouth every 6 (six) hours as needed for nausea. Qty: 30 tablet, Refills: 0    warfarin (COUMADIN) 5 MG tablet Take 1 tablet (5 mg total) by mouth daily. Qty: 30 tablet, Refills: 1  LOVENOX until coumadin therapeutic    CONTINUE these medications which have NOT CHANGED   Details  cyclobenzaprine (FLEXERIL) 10 MG tablet Take 10 mg by mouth 3 (three) times daily as needed.     divalproex (DEPAKOTE ER) 500 MG 24 hr tablet Take 2 tablets (1,000 mg total) by mouth daily. Needs appointment prior to refills Qty: 60 tablet, Refills: 5   Associated Diagnoses: Bipolar 2 disorder      STOP taking these medications     HYDROcodone-acetaminophen (NORCO) 10-325 MG per tablet      diclofenac (VOLTAREN) 75 MG EC tablet         Diagnostic Studies: Dg Chest 2 View  08/09/2011  *RADIOLOGY REPORT*  Clinical Data: Right total hip.  Preop radiograph.  CHEST - 2 VIEW  Comparison: None  Findings: The heart size appears normal.  No pleural effusion or pulmonary edema identified.  No airspace consolidation identified.  Review of the visualized osseous structures is significant for multilevel thoracic spondylosis.  IMPRESSION:  1.  No active cardiopulmonary abnormalities.  Original Report Authenticated By: Rosealee Albee, M.D.   Dg Pelvis 1-2 Views To Be Done In Pacu  08/15/2011  *RADIOLOGY REPORT*  Clinical Data: 50 year old female status post right hip replacement.  PELVIS - 1-2 VIEW  Comparison: None.  Findings: Portable supine AP view 1745 hours.  Bipolar right hip arthroplasty.  Component appear normally aligned.  Mild postoperative changes to the surrounding soft tissues.  No unexpected osseous finding.  IMPRESSION: Right bipolar hip arthroplasty with no adverse features.  Original Report Authenticated By: Harley Hallmark, M.D.    Dg Hip Portable 1  View Right  08/15/2011  *RADIOLOGY REPORT*  Clinical Data: Hip replacement  PORTABLE RIGHT HIP - 1 VIEW  Comparison: 08/15/2011  Findings: Lateral view of the right hip reveals normal alignment. Right hip replacement in satisfactory position.  IMPRESSION: Satisfactory right hip replacement.  Original Report Authenticated By: Camelia Phenes, M.D.    Disposition: Home or Self Care  Discharge Orders    Future Orders Please Complete By Expires   Diet general      Call MD / Call 911      Comments:   If you experience chest pain or shortness of breath, CALL 911 and be transported to the hospital emergency room.  If you develope a fever above 101 F, pus (white drainage) or increased drainage or redness at the wound, or calf pain, call your surgeon's office.   Constipation Prevention      Comments:   Drink plenty of fluids.  Prune juice may be helpful.  You may use a stool softener, such as Colace (over the counter) 100 mg twice a day.  Use MiraLax (over the counter) for constipation as needed.   Increase activity slowly as tolerated      Weight Bearing as taught in Physical Therapy      Comments:   Use a walker or crutches as instructed.   Follow the hip precautions as taught in Physical Therapy      Change dressing      Comments:   You may change your dressing in 3 days, then change the dressing daily with sterile 4 x 4 inch gauze dressing and paper tape.  You may clean the incision with alcohol prior to redressing   TED hose      Comments:   Use stockings (TED hose) for 2 weeks on both leg(s).  You may remove them at night for sleeping.      Follow-up Information    Follow up with Lilliah Priego P in 2 weeks.   Contact information:   [facility] [address], [address] [place] [address] [phone]           Signed: Eulas Post 08/17/2011, 1:43 PM

## 2011-08-17 NOTE — Progress Notes (Signed)
ANTICOAGULATION CONSULT NOTE - Follow Up Consult  Pharmacy Consult for coumadin Indication: VTE prophylaxis  Allergies  Allergen Reactions  . Oxycontin Itching  . Tramadol     Makes feet tingle    Patient Measurements: Height: 5' (152.4 cm) Weight: 209 lb 10.5 oz (95.1 kg) IBW/kg (Calculated) : 45.5    Vital Signs: Temp: 99.2 F (37.3 C) (11/14 0534) Temp src: Oral (11/14 0534) BP: 117/75 mmHg (11/14 0534) Pulse Rate: 113  (11/14 0534)  Labs:  [facility] 08/17/11 0702 08/16/11 0540  HGB 8.4* 8.6*  HCT 25.3* 25.6*  PLT 200 200  APTT -- --  LABPROT 19.4* 14.1  INR 1.61* 1.07  HEPARINUNFRC -- --  CREATININE -- 0.59  CKTOTAL -- --  CKMB -- --  TROPONINI -- --   Estimated Creatinine Clearance: 87.7 ml/min (by C-G formula based on Cr of 0.59).   Medications:  Scheduled:    . divalproex  1,000 mg Oral Daily  . docusate sodium  100 mg Oral BID  . enoxaparin  30 mg Subcutaneous Q12H  . senna  1 tablet Oral BID AC  . warfarin   Does not apply Once    Assessment: 50yo female POD#2 s/p R THA.  Coumadin for DVT prophylaxis.  Lovenox 30mg  sq q12.  INR 1.71.  Hgb is dropping.  No bleeding reported.   Coumadin score is 8 points, but 7.5mg  was too high currently.  Goal of Therapy:  INR 2-3   Plan:  Lovenox to be continued.  Warfarin dose reduced from 7.5mg  to 5mg  today. Check INR in am.  Samella Parr 08/17/2011,11:07 AM

## 2011-08-17 NOTE — Progress Notes (Signed)
Occupational Therapy Evaluation Patient Details Name: RUMOR SUN MRN: [id number] DOB: [birth year] Today's Date: 08/17/2011  OT Assessment/Plan OT Assessment/Plan Comments on Treatment Session: Pt progressing very well towards goals and was eager to participate in therapy session this morning. OT Plan: Discharge plan remains appropriate OT Frequency: Min 2X/week Follow Up Recommendations: None Equipment Recommended: Rolling walker with 5" wheels;3 in 1 bedside comode;Tub/shower bench OT Goals ADL Goals Pt Will Perform Grooming: with modified independence;Standing at sink ADL Goal: Grooming - Progress: Progressing toward goals Pt Will Perform Lower Body Bathing: with modified independence;Sit to stand from chair;with adaptive equipment ADL Goal: Lower Body Bathing - Progress: Not addressed Pt Will Perform Lower Body Dressing: with modified independence;Sit to stand from chair;with adaptive equipment ADL Goal: Lower Body Dressing - Progress: Progressing toward goals Pt Will Transfer to Toilet: with modified independence;Stand pivot transfer;with DME;3-in-1 ADL Goal: Toilet Transfer - Progress: Progressing toward goals Pt Will Perform Toileting - Clothing Manipulation: with modified independence;Standing ADL Goal: Toileting - Clothing Manipulation - Progress: Progressing toward goals Pt Will Perform Toileting - Hygiene: with modified independence;Sit to stand from 3-in-1/toilet ADL Goal: Toileting - Hygiene - Progress: Progressing toward goals  OT Treatment Precautions/Restrictions  Precautions Precautions: Posterior Hip Precaution Booklet Issued: No Restrictions Weight Bearing Restrictions: Yes RLE Weight Bearing: Weight bearing as tolerated   ADL ADL Grooming: Simulated;Independent Where Assessed - Grooming: Standing at sink Lower Body Dressing: Performed;Minimal assistance Lower Body Dressing Details (indicate cue type and reason): min assist to thread bilateral LE  through undergarment while maintaining posterior hip precautions. Where Assessed - Lower Body Dressing: Sit to stand from chair Toilet Transfer: Performed;Other (comment) (min guard assist) Toilet Transfer Method: Stand pivot Toilet Transfer Equipment: Bedside commode Toileting - Clothing Manipulation: Performed;Minimal assistance Where Assessed - Glass blower/designer Manipulation: Standing Toileting - Hygiene: Performed;Minimal assistance Toileting - Hygiene Details (indicate cue type and reason): Min assist for maintaining posterior hip precautions. Where Assessed - Toileting Hygiene: Standing Equipment Used: Rolling walker Mobility  Bed Mobility Bed Mobility: No Supine to Sit: Not tested (comment) Transfers Transfers: Yes Sit to Stand: 5: Supervision;From chair/3-in-1 Sit to Stand Details (indicate cue type and reason): Supervision for safety and to maintain posterior hip precautions. Stand to Sit: 5: Supervision;To chair/3-in-1 Stand to Sit Details: Supervision for safety and to maintain posterior hip precautions. Exercises    End of Session OT - End of Session Equipment Utilized During Treatment: Gait belt Activity Tolerance: Patient tolerated treatment well Patient left: in chair;with call bell in reach;with family/visitor present General Behavior During Session: [facility] for tasks performed Cognition: [facility] for tasks performed  Cipriano Mile  08/17/2011, 11:02 AM 08/17/2011 Cipriano Mile OTR/L Pager [phone] Office [phone]

## 2011-08-17 NOTE — Progress Notes (Signed)
Physical Therapy Treatment Patient Details Name: Bridget [name] MRN: [id number] DOB: November 09, 1960 Today's Date: 08/17/2011  PT Assessment/Plan  PT - Assessment/Plan Comments on Treatment Session: ok for dc home with prn family assist from PT standpoint PT Plan: Discharge plan remains appropriate PT Frequency: 7X/week Follow Up Recommendations: Home health PT Equipment Recommended: Rolling walker with 5" wheels;3 in 1 bedside comode;Tub/shower bench PT Goals  Acute Rehab PT Goals PT Goal: Supine/Side to Sit - Progress: Progressing toward goal PT Goal: Sit to Supine/Side - Progress: Progressing toward goal PT Transfer Goal: Sit to Stand/Stand to Sit - Progress: Progressing toward goal PT Goal: Ambulate - Progress: Progressing toward goal PT Goal: Perform Home Exercise Program - Progress: Progressing toward goal  PT Treatment Precautions/Restrictions  Precautions Precautions: Posterior Hip Precaution Booklet Issued: No Restrictions Weight Bearing Restrictions: Yes RLE Weight Bearing: Weight bearing as tolerated Mobility (including Balance) Bed Mobility Supine to Sit: 4: Min assist (without physical contact) Supine to Sit Details (indicate cue type and reason): cues to use sheet to help control Right le Sitting - Scoot to Edge of Bed: 5: Supervision Sit to Supine - Left: 4: Min assist (without physical contact) Sit to Supine - Left Details (indicate cue type and reason): cues for prec and technique; used sheet roll to assist leg onto bed Transfers Sit to Stand: 5: Supervision;From bed;From chair/3-in-1 (2 reps) Sit to Stand Details (indicate cue type and reason): cues for positioning for post hip prec; progressing well Stand to Sit: 5: Supervision;To bed;To chair/3-in-1 (2 reps) Stand to Sit Details: cues for post hip prec Ambulation/Gait Ambulation/Gait Assistance: 4: Min assist (without physical contact) Ambulation/Gait Assistance Details (indicate cue type and reason): cues  to watch hip internal rotation with turns Ambulation Distance (Feet): 65 Feet Assistive device: Rolling walker Gait Pattern: Step-to pattern    Exercise  Total Joint Exercises Quad Sets: AROM;Right;10 reps;Supine Gluteal Sets: AROM;10 reps;Supine Towel Squeeze: AROM;Right;10 reps Short Arc Quad: AROM;Right;10 reps;Supine Heel Slides: AAROM;10 reps;Right;Supine Hip ABduction/ADduction: AAROM;Right;10 reps;Supine End of Session PT - End of Session Activity Tolerance: Patient tolerated treatment well Patient left: in chair;with call bell in reach;with family/visitor present Nurse Communication: Mobility status for ambulation General Behavior During Session: [facility] for tasks performed (motivated to go home) Cognition: [facility] for tasks performed  [place], [place] [phone] 08/17/2011, 2:42 PM

## 2011-08-19 ENCOUNTER — Encounter (HOSPITAL_COMMUNITY): Payer: Self-pay | Admitting: Orthopedic Surgery

## 2011-09-21 ENCOUNTER — Ambulatory Visit (INDEPENDENT_AMBULATORY_CARE_PROVIDER_SITE_OTHER): Payer: Medicaid Other | Admitting: Psychology

## 2011-09-21 DIAGNOSIS — F3189 Other bipolar disorder: Secondary | ICD-10-CM

## 2011-09-21 DIAGNOSIS — F3181 Bipolar II disorder: Secondary | ICD-10-CM

## 2011-09-21 MED ORDER — BUPROPION HCL ER (SR) 150 MG PO TB12: 150.0000 mg | ORAL_TABLET | Freq: Two times a day (BID) | ORAL | Status: DC

## 2011-09-21 NOTE — Assessment & Plan Note (Addendum)
Report of mood is depressed.  Affect is consistent.  She is tearful.  She does not have outward manifestation of anxiety.  Thoughts are clear and goal directed.  She reports passive thoughts of death but denied suicidal ideation.  Discussed options for better control of depressive symptoms.  Wellbutrin and Lamictal were both considered.  Wellbutrin was thought a good choice for the potential to increase focus as well as ease in dosing (compared to Lamictal).  Jeweldean voiced an understanding.    Could not follow in [facility] as soon as we need to.  Asked her to schedule with Dr. Cristal Ford in early January.  Goal of the visit will be to assess the Wellbutrin for safety, efficacy and tolerability.  I can consult with Dr. Kathrynn Running at the time if need be and we will follow in February for [facility].

## 2011-09-21 NOTE — Patient Instructions (Addendum)
Please schedule a follow-up with Dr. Cristal Ford in early January (first week if possible).  She will make sure you are tolerating the medicine okay and check to see if you have noticed any improvement.  She will let me know how this goes. Unfortunately, we can not follow up with you in [facility] until February 6th.  Please schedule that appointment for 11:30. Dr. Kathrynn Running recommended keeping the Depakote where it is at 1000 mg a day. He recommended adding Wellbutrin with a target dose of 300 mg.  This is a medicine you have been on the past and seem to have tolerated well.  We are hoping that it will help with the depression and focus. Reasons to call the [facility] or schedule an appointment include increased anxiety or worsening mood, thoughts about hurting yourself and / or headaches that you can't tolerate. Take one tablet of the Wellbutrin each morning for three days and then one tablet twice daily.   Dr. Kathrynn Running also recommended that you use the Robaxin (from Dr. Dion Saucier) rather than the Flexeril for your muscle spasms.

## 2011-09-21 NOTE — Progress Notes (Signed)
Bridget [name] presents for follow-up.  She has been taking 1000 mg of Depakote with about six missed doses in the last six weeks.  She reports feeling depressed most days, nearly all day for as long as she can remember.  She reports her mood gets in the way of eating, talking to people, focus and concentration and she just wants to be in a dark place - away from people.  She notes no recent periods of shift in mood and energy toward hypomania.    She reports an increase in anxiety as well.  She had significant stress around Thanksgiving - especially related to family.  We discussed this.

## 2011-10-09 ENCOUNTER — Other Ambulatory Visit: Payer: Self-pay | Admitting: Family Medicine

## 2011-10-09 NOTE — Telephone Encounter (Signed)
Refill request

## 2011-10-10 ENCOUNTER — Ambulatory Visit (INDEPENDENT_AMBULATORY_CARE_PROVIDER_SITE_OTHER): Payer: Medicaid Other | Admitting: Family Medicine

## 2011-10-10 ENCOUNTER — Encounter: Payer: Self-pay | Admitting: Family Medicine

## 2011-10-10 VITALS — BP 133/87 | HR 74 | Temp 97.8°F | Ht 60.0 in | Wt 206.0 lb

## 2011-10-10 DIAGNOSIS — Z23 Encounter for immunization: Secondary | ICD-10-CM

## 2011-10-10 DIAGNOSIS — R41840 Attention and concentration deficit: Secondary | ICD-10-CM

## 2011-10-10 DIAGNOSIS — F17200 Nicotine dependence, unspecified, uncomplicated: Secondary | ICD-10-CM

## 2011-10-10 DIAGNOSIS — R413 Other amnesia: Secondary | ICD-10-CM

## 2011-10-10 DIAGNOSIS — F3189 Other bipolar disorder: Secondary | ICD-10-CM

## 2011-10-10 DIAGNOSIS — M549 Dorsalgia, unspecified: Secondary | ICD-10-CM

## 2011-10-10 DIAGNOSIS — F329 Major depressive disorder, single episode, unspecified: Secondary | ICD-10-CM

## 2011-10-10 DIAGNOSIS — F3181 Bipolar II disorder: Secondary | ICD-10-CM

## 2011-10-10 NOTE — Patient Instructions (Signed)
NIce to see you. Great job on quitting smoking! You should be so proud of yourself! Make sure to follow up with Dr. Pascal Lux as scheduled. Make an appointment with me in 3-4 months or as needed.  Try to decrease your robaxin and see if your concentration improves.

## 2011-10-12 DIAGNOSIS — R41840 Attention and concentration deficit: Secondary | ICD-10-CM | POA: Insufficient documentation

## 2011-10-12 NOTE — Assessment & Plan Note (Signed)
Likely due to arthritis and rehab from hip surgery. No longer in PT. Encourage continuation of activity level, strengthening exercises. Patient to f/u with orthopedist this month and incouraged her to return to clinic if not improved in 1 month. Continue robaxin, voltaren, percocet per pain clinic.

## 2011-10-12 NOTE — Assessment & Plan Note (Addendum)
Depressive symptoms are stable with possible overall inprovment of mood per patient. Will continue wellbutin dose until follow up in [facility] in February.

## 2011-10-12 NOTE — Assessment & Plan Note (Signed)
Successful cessation currently on wellbutrin.

## 2011-10-12 NOTE — Assessment & Plan Note (Signed)
No signs mania today. Continue on depakote and wellbutrin. Concerned medications may be contributing to her confusion and comprehension problems, but seems to be functioning in daily life. Will f/u for LFTs, CBC at next visit as these were normal at current dose 2 months ago.

## 2011-10-12 NOTE — Assessment & Plan Note (Signed)
Question if this is result of depakote, psychiatric illness, or pain medication. Encouraged patient to try and decrease dependence on muscle relaxants (robaxin) and assess response.

## 2011-10-12 NOTE — Progress Notes (Signed)
  Subjective:    Patient ID: Bridget [name], female    DOB: [birth year], 51 y.o.   MRN: [id number]  HPI  1. Bipolar depression. Started on wellbutrin last month at [facility]. Patient feels like she has an overall improvement in mood and describes her mood as "alright." Still has some crying spells. Most beneficial aspect of wellbutrin is her development of bad taste with smoking, so she has stopped smoking. She becomes tearful when describing her guilt over years of smoking around children. Does not describe any extreme mood swings, pressured speech, mania symptoms or known medication side effects.   2. Confusion. Does complain of some confusion and problems with comprehension, needing people to repeat themselves at times. Unsure when exactly this started, but does not feel it has changed with addition of wellbutrin.   3. Back pain. Bilateral lower back pain started near end of her physical therapy for hip replacement in November. Sometimes radiated to posterior R>L thighs. Goes to pain clinic (Dr. Adah Perl?) and is managed with robaxin TID. Scheduled to return to orthopedist in near future.   4. Smoking cessation. Feels like breathing has improved since smoking cessation several weeks ago.  Review of Systems See HPI otherwise negative.     Objective:   Physical Exam  Vitals reviewed. Constitutional: She is oriented to person, place, and time. She appears well-developed and well-nourished. No distress.       Tearful at times. Speech fluent.  HENT:  Head: Normocephalic and atraumatic.  Mouth/Throat: Oropharynx is clear and moist.  Eyes: EOM are normal. Pupils are equal, round, and reactive to light.  Cardiovascular: Normal rate, regular rhythm, normal heart sounds and intact distal pulses.   No murmur heard. Pulmonary/Chest: Effort normal and breath sounds normal. No respiratory distress. She has no wheezes. She has no rales.  Musculoskeletal: She exhibits no edema.  Neurological: She is  alert and oriented to person, place, and time. No cranial nerve deficit. She exhibits normal muscle tone. Coordination normal.  Psychiatric: Thought content normal.       Appropriate speech and thought processes. Affect is sad, tearful recounting smoking history.           Assessment & Plan:

## 2011-10-25 ENCOUNTER — Other Ambulatory Visit: Payer: Self-pay | Admitting: Psychology

## 2011-10-25 DIAGNOSIS — F3181 Bipolar II disorder: Secondary | ICD-10-CM

## 2011-10-25 MED ORDER — BUPROPION HCL ER (SR) 150 MG PO TB12: 150.0000 mg | ORAL_TABLET | Freq: Two times a day (BID) | ORAL | Status: DC

## 2011-10-25 NOTE — Telephone Encounter (Signed)
Bridget [name] called to request med refill.  I did a phone note but couldn't find it so this may be a duplicate.  Tolerating 300 mg of Wellbutrin well.  Has [facility] follow-up 11/09/2011.  Will run out of medication before then.  Dr. Kathrynn Running authorized.  Med called [facility] at [facility].  #60 with three refills.

## 2011-10-25 NOTE — Assessment & Plan Note (Signed)
Patient reported she is tolerating the medicine fine.  Will follow in [facility] on 11/09/2011 as originally scheduled.

## 2011-10-25 NOTE — Telephone Encounter (Signed)
Abree called requesting a refill of Wellbutrin.  She has a follow-up in [facility] on February 6th but will run out before then.  She reported she is tolerating the medicine well.  She also said she had quit smoking.  [facility] called [facility] to [facility] @ [facility] and authorized by Dr. Kathrynn Running.

## 2011-11-09 ENCOUNTER — Ambulatory Visit (INDEPENDENT_AMBULATORY_CARE_PROVIDER_SITE_OTHER): Payer: Medicaid Other | Admitting: Psychology

## 2011-11-09 ENCOUNTER — Other Ambulatory Visit: Payer: Self-pay | Admitting: Family Medicine

## 2011-11-09 DIAGNOSIS — F3181 Bipolar II disorder: Secondary | ICD-10-CM

## 2011-11-09 DIAGNOSIS — F3189 Other bipolar disorder: Secondary | ICD-10-CM

## 2011-11-09 MED ORDER — QUETIAPINE FUMARATE 100 MG PO TABS: 100.0000 mg | ORAL_TABLET | Freq: Every day | ORAL | Status: DC

## 2011-11-09 NOTE — Assessment & Plan Note (Addendum)
Report of mood is depressed.  Affect is consistent.  She is tearful and appears flat.  Thought content is very negative.  No evidence of SI.  Discussed medication options.  Thought about dropping Depakote (because we don't think it has been that helpful) and starting Seroquel.  She really wants sleep and this should help with that as well as mood.  Also discussed adding Lamictal to try to more fully target depression.  Gave Bridget [name] the option and she was most interested in starting Seroquel.  Discussed AE including somnolence, ortho stasis and appetite issues.  Wellbutrin does not seem to have helped significantly with mood but she has stopped smoking on it.  Will keep on board for 1-2 more months for smoking cessation purposes.    See patient instructions for further plan.

## 2011-11-09 NOTE — Progress Notes (Signed)
Bridget [name] presents for [facility] follow-up.  She reports she is depressed and cites stressors with a daughter and her health as being primary.  She feels worthless and thinks that she has not accepted the changes in her health that have limited her function.  She reported she thinks the Wellbutrin has been helpful in terms of smoking cessation and in mood.  She is unable to state how it has helped with her mood.  She noted something about her mood shifts to the higher end as lasting longer on the Wellbutrin but she was not able to substantiate this.

## 2011-11-09 NOTE — Patient Instructions (Addendum)
Please schedule a follow-up for [facility] for March 6th at 9:00 a.m.  That is the only appt time I had available.   We are really glad to see you today Bridget [name].  I am glad you decided to come. You have a follow-up scheduled with Dr. Cristal Ford on 2/28.  Please discuss tolerability of the Seroquel with her.  If you have any significant problems with the medicine though before then, please call me at [phone]. Dr. Kathrynn Running recommended you stop the Depakote.  Please do this by dropping Depakote by one pill between now and your appointment with Dr. Cristal Ford.  She will decrease you further at that appt by one pill and we will see you in follow-up at the [facility]. Dr. Kathrynn Running recommended you start Seroquel.  It is a bedtime medicine.  Do not do anything after you take the medicine except be ready to get in bed.  You will probably notice some daytime drowsiness especially in the beginning.  It will get better.  Also - when going from sitting to standing (or laying down and standing), GIVE YOURSELF A MOMENT to adjust.  Move slowly and make sure you are steady on your feet before you start to walk.   Take 1/2 of a Seroquel the first night.  Second night is 1 pill.  Third night is 2 pills.  Fourth night is 3 pills (300 mg). Please limit fluid intake after 7:00 p.m. To prevent having to go to the bathroom so much during the night.

## 2011-11-22 ENCOUNTER — Other Ambulatory Visit: Payer: Self-pay | Admitting: Sports Medicine

## 2011-11-22 DIAGNOSIS — M545 Low back pain: Secondary | ICD-10-CM

## 2011-11-26 ENCOUNTER — Ambulatory Visit
Admission: RE | Admit: 2011-11-26 | Discharge: 2011-11-26 | Disposition: A | Discharge status: Home / Self Care | Payer: Medicaid Other | Source: Ambulatory Visit | Attending: Sports Medicine | Admitting: Sports Medicine

## 2011-11-26 DIAGNOSIS — M545 Low back pain: Secondary | ICD-10-CM

## 2011-12-01 ENCOUNTER — Ambulatory Visit: Payer: Medicaid Other | Admitting: Family Medicine

## 2011-12-07 ENCOUNTER — Ambulatory Visit (INDEPENDENT_AMBULATORY_CARE_PROVIDER_SITE_OTHER): Payer: Medicaid Other | Admitting: Psychology

## 2011-12-07 ENCOUNTER — Encounter: Payer: Self-pay | Admitting: Psychology

## 2011-12-07 DIAGNOSIS — F3181 Bipolar II disorder: Secondary | ICD-10-CM

## 2011-12-07 DIAGNOSIS — F3189 Other bipolar disorder: Secondary | ICD-10-CM

## 2011-12-07 MED ORDER — QUETIAPINE FUMARATE 100 MG PO TABS: ORAL_TABLET | ORAL | Status: DC

## 2011-12-07 MED ORDER — DULOXETINE HCL 60 MG PO CPEP: 60.0000 mg | ORAL_CAPSULE | Freq: Every day | ORAL | Status: DC

## 2011-12-07 NOTE — Assessment & Plan Note (Addendum)
Report of mood is depressed and anxious.  Affect is consistent.  She initially related some benefit in mood from the Seroquel but rapidly moved to tearfulness and somewhat of a monologue about her pain, anxiety and depression (and the way that she used to be compared to now).  Dr. Kathrynn Running thinks a major barrier to better mood control is pain relief.  Bridget [name] says she has never been on a medicine for pain other than "pain medication."  Discussed the use of Cymbalta to treat neuropathic pain (which she seems to have based on findings of imaging and neuroconduction study).  He does not want her to stop the Seroquel if she is going to start Cymbalta.  Bridget [name] was interested in this approach.    Okay to stop Depakote (as was the original plan).  Wellbutrin is not providing any mood benefit as far as we can tell.  She is still smoke free.  Okay to stop now.      Spent 45 minutes with patient today.

## 2011-12-07 NOTE — Patient Instructions (Addendum)
Please follow-up with Dr. Cristal Ford as already scheduled.  Please report any side effects that you are having trouble managing.  Also - would need to look at evidence of hypomania (being too busy, talking too much or too fast, can't sit still) and safety (thoughts of hurting yourself or others). We want to see you back on:  April 3rd at 9:00.  You can call [phone] with questions or concerns.   Stop the Depakote. Stop the Wellbutrin.  You do not need to taper. Begin Cymbalta 60 mg.  We are hoping this medicine will be helpful for both your mood and pain.  Take it with supper.  If it makes you severely nauseous, please call and let us know.  Mild nausea is not uncommon.  It should go away in a week or less. Getting therapy to help you manage your depression and fear would be helpful.  You can look on this website to find a provider that accepts your insurnace:  [email or web address].

## 2011-12-07 NOTE — Progress Notes (Signed)
Patient presents in pain.  She is taking the Seroquel 300 mg for about three weeks.  She has noticed an increase in appetite and has a dry mouth.  Her sleep is not significantly improved however she relates this predominately due to pain.    She is also reporting significant anxiety.  Describes a family gathering in mid-March that she is "dreading."  Reviewed history including recent imaging and neuroconduction studies.

## 2011-12-12 ENCOUNTER — Ambulatory Visit: Payer: Medicaid Other | Admitting: Family Medicine

## 2012-01-04 ENCOUNTER — Ambulatory Visit: Payer: Medicaid Other | Admitting: Psychology

## 2012-01-04 ENCOUNTER — Telehealth: Payer: Self-pay | Admitting: Psychology

## 2012-01-04 NOTE — Telephone Encounter (Signed)
Called at 6:03 this morning and left a VM stating it wasn't a good day and she couldn't attend her appointment.  Tough to understand her on VM but sounds like she needs medications refilled.  Left a VM asking her to call me back to clarify what she needs.

## 2012-01-15 ENCOUNTER — Encounter (HOSPITAL_COMMUNITY): Payer: Self-pay | Admitting: *Deleted

## 2012-01-15 ENCOUNTER — Emergency Department (HOSPITAL_COMMUNITY)
Admission: EM | Admit: 2012-01-15 | Discharge: 2012-01-15 | Disposition: A | Discharge status: Home / Self Care | Payer: Medicaid Other | Attending: Emergency Medicine | Admitting: Emergency Medicine

## 2012-01-15 DIAGNOSIS — F319 Bipolar disorder, unspecified: Secondary | ICD-10-CM | POA: Insufficient documentation

## 2012-01-15 DIAGNOSIS — R51 Headache: Secondary | ICD-10-CM | POA: Insufficient documentation

## 2012-01-15 DIAGNOSIS — M79609 Pain in unspecified limb: Secondary | ICD-10-CM | POA: Insufficient documentation

## 2012-01-15 DIAGNOSIS — M545 Low back pain, unspecified: Secondary | ICD-10-CM | POA: Insufficient documentation

## 2012-01-15 DIAGNOSIS — R109 Unspecified abdominal pain: Secondary | ICD-10-CM | POA: Insufficient documentation

## 2012-01-15 DIAGNOSIS — M546 Pain in thoracic spine: Secondary | ICD-10-CM | POA: Insufficient documentation

## 2012-01-15 DIAGNOSIS — M62838 Other muscle spasm: Secondary | ICD-10-CM

## 2012-01-15 DIAGNOSIS — R079 Chest pain, unspecified: Secondary | ICD-10-CM | POA: Insufficient documentation

## 2012-01-15 LAB — POCT I-STAT, CHEM 8
Calcium, Ion: 1.19 mmol/L (ref 1.12–1.32)
Chloride: 105 mEq/L (ref 96–112)
Glucose, Bld: 124 mg/dL — ABNORMAL HIGH (ref 70–99)
HCT: 40 % (ref 36.0–46.0)
Hemoglobin: 13.6 g/dL (ref 12.0–15.0)
Potassium: 4.1 mEq/L (ref 3.5–5.1)

## 2012-01-15 MED ORDER — DIAZEPAM 5 MG PO TABS: 5.0000 mg | ORAL_TABLET | Freq: Two times a day (BID) | ORAL | Status: AC

## 2012-01-15 MED ORDER — ACETAMINOPHEN 325 MG PO TABS
650.0000 mg | ORAL_TABLET | Freq: Once | ORAL | Status: AC
  Administered 2012-01-15: 650 mg via ORAL
  Filled 2012-01-15: qty 2

## 2012-01-15 MED ORDER — HYDROMORPHONE HCL PF 1 MG/ML IJ SOLN
1.0000 mg | Freq: Once | INTRAMUSCULAR | Status: AC
  Administered 2012-01-15: 1 mg via INTRAMUSCULAR
  Filled 2012-01-15: qty 1

## 2012-01-15 MED ORDER — DIAZEPAM 5 MG/ML IJ SOLN
5.0000 mg | Freq: Once | INTRAMUSCULAR | Status: AC
  Administered 2012-01-15: 5 mg via INTRAMUSCULAR
  Filled 2012-01-15: qty 2

## 2012-01-15 NOTE — Discharge Instructions (Signed)
Muscle Cramps Muscle cramps are when muscles tighten by themselves. Muscle cramps usually improve or go away within minutes. HOME CARE  Massage the muscle.   Stretch the muscle.   Relax the muscle.   Only take medicine as told by your doctor.   Drink enough fluids to keep your pee (urine) clear or pale yellow.  GET HELP RIGHT AWAY IF:  Cramps are frequent and do not get better with medicine. MAKE SURE YOU:  Understand these intructions.   Will watch your condition.   Will get help right away if your are not doing well or get worse.  Document Released: 09/01/2008 Document Revised: 09/08/2011 Document Reviewed: 09/10/2008 [facility] Patient Information 2012 [place], [place].Muscle Cramps Muscle cramps are due to sudden involuntary muscle contraction. This means you have no control over the tightening of a muscle (or muscles). Often there are no obvious causes. Muscle cramps may occur with overexertion. They may also occur with chilling of the muscles. An example of a muscle chilling activity is swimming. It is uncommon for cramps to be due to a serious underlying disorder. In most cases, muscle cramps improve (or leave) within minutes. CAUSES  Some common causes are:  Injury.   Infections, especially viral.   Abnormal levels of the salts and ions in your blood (electrolytes). This could happen if you are taking water pills (diuretics).   Blood vessel disease where not enough blood is getting to the muscles (intermittent claudication).  Some uncommon causes are:  Side effects of some medicine (such as lithium).   Alcohol abuse.   Diseases where there is soreness (inflammation) of the muscular system.  HOME CARE INSTRUCTIONS   It may be helpful to massage, stretch, and relax the affected muscle.   Taking a dose of over-the-counter diphenhydramine is helpful for night leg cramps.  SEEK MEDICAL CARE IF:  Cramps are frequent and not relieved with medicine. MAKE SURE YOU:    Understand these instructions.   Will watch your condition.   Will get help right away if you are not doing well or get worse.  Document Released: 03/11/2002 Document Revised: 09/08/2011 Document Reviewed: 09/10/2008 [facility] Patient Information 2012 [place], [place].

## 2012-01-15 NOTE — ED Provider Notes (Signed)
History     CSN: [id number]  Arrival date & time 01/15/12  6962   First MD Initiated Contact with Patient 01/15/12 2002      Chief Complaint  Patient presents with  . Spasms  . Back Pain  . Leg Pain    (Consider location/radiation/quality/duration/timing/severity/associated sxs/prior treatment) HPI Comments: Patient here with multiple complaints including vague headache on the right side of her head ("and I don't have headaches"), muscle spasms to lower back, upper back, radiating around to upper abdomen, chest and into lower legs.  She denies any weakness, chills, fever, inability to swallow, nausea, vomiting, constipation or diarrhea.  She reports that she is taking robaxin and hydrocodone for the pain without relief.  Patient is a 51 y.o. female presenting with back pain and leg pain. The history is provided by the patient. No language interpreter was used.  Back Pain  This is a recurrent problem. The current episode started more than 2 days ago. The problem occurs constantly. The problem has not changed since onset.The pain is associated with no known injury. The pain is present in the lumbar spine. The quality of the pain is described as stabbing and shooting. Radiates to: radiates to bilateral abdominal wall, both legs, and feel like they are moving up her body. The pain is at a severity of 10/10. The pain is severe. The symptoms are aggravated by bending, twisting and certain positions. The pain is the same all the time. Stiffness is present all day. Associated symptoms include headaches and leg pain. Pertinent negatives include no chest pain, no fever, no numbness, no weight loss, no abdominal pain, no abdominal swelling, no bowel incontinence, no perianal numbness, no bladder incontinence, no dysuria, no pelvic pain, no paresthesias, no paresis, no tingling and no weakness. She has tried muscle relaxants for the symptoms. The treatment provided no relief. Risk factors include obesity  and a sedentary lifestyle.  Leg Pain  Pertinent negatives include no numbness and no tingling.    Past Medical History  Diagnosis Date  . Depression   . Bipolar II disorder   . Osteoarthritis   . Obesity, morbid     Past Surgical History  Procedure Date  . Rotator cuff repair     BILATERAL  . Bone spurs     REMOVED FROM SHOULDERS  . Eye surgery     STY REMOVED LEFT EYE  1992  . 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 Bipolar disorder Sister   . Hypotension Sister   . Hypotension Brother   . Hypotension Other   . Hypotension Mother   . Hypotension Maternal Aunt     History  Substance Use Topics  . Smoking status: Former Smoker -- 0.5 packs/day for 20 years    Types: Cigarettes  . Smokeless tobacco: Not on file  . Alcohol Use: No    OB History    Grav Para Term Preterm Abortions TAB SAB Ect Mult Living                  Review of Systems  Constitutional: Negative for fever and weight loss.  HENT: Negative for neck pain.   Cardiovascular: Negative for chest pain.  Gastrointestinal: Negative for abdominal pain and bowel incontinence.  Genitourinary: Negative for bladder incontinence, dysuria and pelvic pain.  Musculoskeletal:  Positive for back pain.  Neurological: Positive for headaches. Negative for tingling, weakness, numbness and paresthesias.  All other systems reviewed and are negative.    Allergies  Oxycontin and Tramadol  Home Medications   Current Outpatient Rx  Name Route Sig Dispense Refill  . DICLOFENAC SODIUM 50 MG PO TBEC Oral Take 50 mg by mouth 2 (two) times daily.      . DULOXETINE HCL 60 MG PO CPEP Oral Take 1 capsule (60 mg total) by mouth daily. 30 capsule 11  . HYDROCODONE-ACETAMINOPHEN 10-325 MG PO TABS Oral Take 1 tablet by mouth every 4 (four) hours as needed.  For pain relief      Per ortho-hip replacement, OA  . METHOCARBAMOL 750 MG PO TABS Oral Take 750 mg by mouth 3 (three) times daily.        BP 122/70  Pulse 78  Temp(Src) 99 F (37.2 C) (Oral)  Resp 20  SpO2 100%  LMP 12/28/2011  Physical Exam  Nursing note and vitals reviewed. Constitutional: She is oriented to person, place, and time. She appears well-developed and well-nourished. No distress.  HENT:  Head: Normocephalic and atraumatic.  Right Ear: External ear normal.  Left Ear: External ear normal.  Nose: Nose normal.  Mouth/Throat: Oropharynx is clear and moist. No oropharyngeal exudate.  Eyes: Conjunctivae are normal. Pupils are equal, round, and reactive to light. No scleral icterus.  Neck: Normal range of motion. Neck supple.  Cardiovascular: Normal rate, regular rhythm and normal heart sounds.  Exam reveals no gallop and no friction rub.   No murmur heard. Pulmonary/Chest: Effort normal and breath sounds normal. No respiratory distress. She has no wheezes. She has no rales. She exhibits no tenderness.  Abdominal: Soft. Bowel sounds are normal. She exhibits no distension. There is no tenderness.  Musculoskeletal: Normal range of motion. She exhibits tenderness. She exhibits no edema.       Patient reports tenderness to palpation of all muscle groups  Lymphadenopathy:    She has no cervical adenopathy.  Neurological: She is alert and oriented to person, place, and time. No cranial nerve deficit.  Skin: Skin is warm and dry. No rash noted. No erythema. No pallor.  Psychiatric: She has a normal mood and affect. Her behavior is normal. Judgment and thought content normal.    ED Course  Procedures (including critical care time)  Labs Reviewed  POCT I-STAT, CHEM 8 - Abnormal; Notable for the following:    Glucose, Bld 124 (*)    All other components within normal limits   No results found. Results for orders placed during the hospital encounter of 01/15/12  POCT  I-STAT, CHEM 8      Component Value Range   Sodium 140  135 - 145 (mEq/L)   Potassium 4.1  3.5 - 5.1 (mEq/L)   Chloride 105  96 - 112 (mEq/L)   BUN 12  6 - 23 (mg/dL)   Creatinine, Ser 7.82  0.50 - 1.10 (mg/dL)   Glucose, Bld 956 (*) 70 - 99 (mg/dL)   Calcium, Ion 2.13  0.86 - 1.32 (mmol/L)   TCO2 24  0 - 100 (mmol/L)   Hemoglobin 13.6  12.0 - 15.0 (g/dL)   HCT 57.8  46.9 - 62.9 (%)   No results found.    Muscle Spasms    MDM  Patient here with multiple pain complaints - reports mild improvement after the dilaudid and valium, will start her on short course of valium po, she is to  follow up with [facility] tomorrow, I find no real acute emergency problem on this patient.        Izola Price [place], [place] 01/15/12 2126

## 2012-01-15 NOTE — ED Notes (Signed)
Pt states a hx of chronic pain and uses pain clinic

## 2012-01-15 NOTE — ED Notes (Signed)
Pt report muscle spasms allover body. Pt reports she has a history of back spasms and pinched nerve in lower back.  Pt reports these muscle spasms are more severe x 3 days. Pt reports spasms are in her legs, back, abdomen and feel like they are moving up her body.

## 2012-01-16 ENCOUNTER — Telehealth: Payer: Self-pay | Admitting: Psychology

## 2012-01-16 NOTE — Telephone Encounter (Signed)
Bridget [name] called to reschedule her missed [facility] appointment.  First available was May 8th at 11:00.  Scheduled for then.  She said she has had trouble coming out of the house.  She is "confined."  Missing doctor's appointments because of it.  She reports feeling blue and down.  Since last appointment she stopped Depakote and Wellbutrin and started Cymbalta.  Having significant pain - three days of all over muscle spasms.  Went to the ED last night.  Told her I would call her if we had a cancel for an earlier [facility] appointment.  She is interested in discussing getting counseling as well at that appointment.  Asked if she wanted a referral now and she said she wanted to wait to discuss.

## 2012-01-16 NOTE — Telephone Encounter (Signed)
Had a cancellation in [facility] and called patient to see if she wanted to get in earlier.  The 9:00 appointment is tough for her but she wanted to schedule.  Told her she needed to arrive on time because we wouldn't be able to see her if she were appreciably late.  Asked her to call if she wasn't coming.  She agreed.

## 2012-01-17 ENCOUNTER — Encounter: Payer: Self-pay | Admitting: Family Medicine

## 2012-01-17 ENCOUNTER — Ambulatory Visit (INDEPENDENT_AMBULATORY_CARE_PROVIDER_SITE_OTHER): Payer: Medicaid Other | Admitting: Family Medicine

## 2012-01-17 VITALS — BP 128/82 | HR 100 | Temp 98.9°F | Ht 60.0 in | Wt 223.0 lb

## 2012-01-17 DIAGNOSIS — L282 Other prurigo: Secondary | ICD-10-CM

## 2012-01-17 DIAGNOSIS — Z111 Encounter for screening for respiratory tuberculosis: Secondary | ICD-10-CM

## 2012-01-17 DIAGNOSIS — F3189 Other bipolar disorder: Secondary | ICD-10-CM

## 2012-01-17 DIAGNOSIS — F3181 Bipolar II disorder: Secondary | ICD-10-CM

## 2012-01-17 DIAGNOSIS — N898 Other specified noninflammatory disorders of vagina: Secondary | ICD-10-CM

## 2012-01-17 DIAGNOSIS — M549 Dorsalgia, unspecified: Secondary | ICD-10-CM

## 2012-01-17 DIAGNOSIS — L293 Anogenital pruritus, unspecified: Secondary | ICD-10-CM

## 2012-01-17 LAB — POCT WET PREP (WET MOUNT)

## 2012-01-17 MED ORDER — QUETIAPINE FUMARATE 100 MG PO TABS: 300.0000 mg | ORAL_TABLET | Freq: Every day | ORAL | Status: AC

## 2012-01-17 MED ORDER — TERCONAZOLE 0.8 % VA CREA: 1.0000 | TOPICAL_CREAM | Freq: Every day | VAGINAL | Status: AC

## 2012-01-17 NOTE — Assessment & Plan Note (Addendum)
Deteriorated mood, depression is overriding symptom exacerbated by discontinuation of medications due to perceived adverse drug effect. Reassured that I do not think her candidal-appearing rash is caused by medications. She may have gotten some mood benefit from cymbalta and would benefit from restarting this, but may take time for efficacy. She stopped seroquel due to paranoia. Fortunately Dr. Pascal Lux was available to also assess the patient's mood today, and we have devised plan to restart seroquel at previous dose 300 mg for acute severe depression with psychomotor retardation in the short term. If her paranoia returns, perhaps lamictal would be a better choice in th long term. She will f/u at [facility] tomorrow with Dr. Pascal Lux and Dr. Kathrynn Running who have been managing medications. She agrees to call if unable to make appointment. She was also prescribed valium and has few tablets left for prn use. I advised BZDs not likely to be beneficial for long term treatment.

## 2012-01-17 NOTE — Assessment & Plan Note (Signed)
Advised robaxin is not likely cause of rash and she may use prn.

## 2012-01-17 NOTE — Assessment & Plan Note (Signed)
Will treat presumed yeast infection with topical terconazole. Risk factor obesity and female. Wet prep taken. F/u if not improved.

## 2012-01-17 NOTE — Patient Instructions (Addendum)
Your depression and pain are worse because no mood medications. I think seroquel may help you tonight. Take 3 tabs seroquel (300mg  total) tonight. Make it to your apponitment with Dr. Pascal Lux tomorrow. If you cannot make it, call the clinic ahead of time. If your vaginal itching continues or does not improve, make an appointment for recheck.  Candida Infection, Adult A candida infection (also called yeast, fungus and Monilia infection) is an overgrowth of yeast that can occur anywhere on the body. A yeast infection commonly occurs in warm, moist body areas. Usually, the infection remains localized but can spread to become a systemic infection. A yeast infection may be a sign of a more severe disease such as diabetes, leukemia, or AIDS. A yeast infection can occur in both men and women. In women, Candida vaginitis is a vaginal infection. It is one of the most common causes of vaginitis. Men usually do not have symptoms or know they have an infection until other problems develop. Men may find out they have a yeast infection because their sex partner has a yeast infection. Uncircumcised men are more likely to get a yeast infection than circumcised men. This is because the uncircumcised glans is not exposed to air and does not remain as dry as that of a circumcised glans. Older adults may develop yeast infections around dentures. CAUSES  Women  Antibiotics.   Steroid medication taken for a long time.   Being overweight (obese).   Diabetes.   Poor immune condition.   Certain serious medical conditions.   Immune suppressive medications for organ transplant patients.   Chemotherapy.   Pregnancy.   Menstration.   Stress and fatigue.   Intravenous drug use.   Oral contraceptives.   Wearing tight-fitting clothes in the crotch area.   Catching it from a sex partner who has a yeast infection.   Spermicide.   Intravenous, urinary, or other catheters.  Men  Catching it from a sex  partner who has a yeast infection.   Having oral or anal sex with a person who has the infection.   Spermicide.   Diabetes.   Antibiotics.   Poor immune system.   Medications that suppress the immune system.   Intravenous drug use.   Intravenous, urinary, or other catheters.  SYMPTOMS  Women  Thick, white vaginal discharge.   Vaginal itching.   Redness and swelling in and around the vagina.   Irritation of the lips of the vagina and perineum.   Blisters on the vaginal lips and perineum.   Painful sexual intercourse.   Low blood sugar (hypoglycemia).   Painful urination.   Bladder infections.   Intestinal problems such as constipation, indigestion, bad breath, bloating, increase in gas, diarrhea, or loose stools.  Men  Men may develop intestinal problems such as constipation, indigestion, bad breath, bloating, increase in gas, diarrhea, or loose stools.   Dry, cracked skin on the penis with itching or discomfort.   Jock itch.   Dry, flaky skin.   Athlete's foot.   Hypoglycemia.  DIAGNOSIS  Women  A history and an exam are performed.   The discharge may be examined under a microscope.   A culture may be taken of the discharge.  Men  A history and an exam are performed.   Any discharge from the penis or areas of cracked skin will be looked at under the microscope and cultured.   Stool samples may be cultured.  TREATMENT  Women  Vaginal antifungal suppositories and creams.  Medicated creams to decrease irritation and itching on the outside of the vagina.   Warm compresses to the perineal area to decrease swelling and discomfort.   Oral antifungal medications.   Medicated vaginal suppositories or cream for repeated or recurrent infections.   Wash and dry the irritation areas before applying the cream.   Eating yogurt with lactobacillus may help with prevention and treatment.   Sometimes painting the vagina with gentian violet solution  may help if creams and suppositories do not work.  Men  Antifungal creams and oral antifungal medications.   Sometimes treatment must continue for 30 days after the symptoms go away to prevent recurrence.  HOME CARE INSTRUCTIONS  Women  Use cotton underwear and avoid tight-fitting clothing.   Avoid colored, scented toilet paper and deodorant tampons or pads.   Do not douche.   Keep your diabetes under control.   Finish all the prescribed medications.   Keep your skin clean and dry.   Consume milk or yogurt with lactobacillus active culture regularly. If you get frequent yeast infections and think that is what the infection is, there are over-the-counter medications that you can get. If the infection does not show healing in 3 days, talk to your caregiver.   Tell your sex partner you have a yeast infection. Your partner may need treatment also, especially if your infection does not clear up or recurs.  Men  Keep your skin clean and dry.   Keep your diabetes under control.   Finish all prescribed medications.   Tell your sex partner that you have a yeast infection so they can be treated if necessary.  SEEK MEDICAL CARE IF:   Your symptoms do not clear up or worsen in one week after treatment.   You have an oral temperature above 102 F (38.9 C).   You have trouble swallowing or eating for a prolonged time.   You develop blisters on and around your vagina.   You develop vaginal bleeding and it is not your menstrual period.   You develop abdominal pain.   You develop intestinal problems as mentioned above.   You get weak or lightheaded.   You have painful or increased urination.   You have pain during sexual intercourse.  MAKE SURE YOU:   Understand these instructions.   Will watch your condition.   Will get help right away if you are not doing well or get worse.  Document Released: 10/27/2004 Document Revised: 09/08/2011 Document Reviewed:  02/08/2010 [facility] Patient Information 2012 [place], [place].

## 2012-01-17 NOTE — Progress Notes (Signed)
  Subjective:    Patient ID: Bridget [name], female    DOB: 21-May-1961, 51 y.o.   MRN: [id number]  HPI  1. Mood disorder. Patient has been extremely tearful, depressed past few days. Complains of pain all over body. Presented to ED yesterday, no diagnoses and was discharged with rx for valium. She took 10mg  valium which helped her sleep last night. Patient has stopped taking meds few days ago-cymbalta and robaxin-due to development of an irritated rash on her bottom which she attributed to adverse med effect. She stopped taking seroquel sometime (maybe few weeks ago) because it made her paranoid. She wouldn't let family leave the house, was seeing hallucinations and some auditory hallucinations also. This medicine did help her sleep some.   2. Vaginal irritation. In inguinal folds and groin, vaginal area. Irritated. Noted some dark skin flakes. Severe pruritis. Used a scrub brush on area attempting to clean off. Used antibacterial ointment on this. Unsure of cause, but attributes new meds to this rash.  Review of Systems See HPI otherwise negative. No recent drug use.    Objective:   Physical Exam  Vitals reviewed. Constitutional: She appears well-developed and well-nourished.       Very tearful, looks at ground.   HENT:  Head: Normocephalic and atraumatic.  Mouth/Throat: Oropharynx is clear and moist.  Eyes: EOM are normal.  Cardiovascular: Normal rate and regular rhythm.   Pulmonary/Chest: Effort normal.  Genitourinary: No vaginal discharge found.  Musculoskeletal: She exhibits no edema and no tenderness.  Neurological: She is alert. Coordination normal.  Skin: Rash noted. There is erythema.       Labia majora with erythema, excoriations, irritation. No skin ulcers or breakdown or abscesses. Scant vaginal discharge. No odor noted.   Psychiatric:       Flat affect. Sluggish to respond.       Assessment & Plan:

## 2012-01-18 ENCOUNTER — Telehealth: Payer: Self-pay | Admitting: Psychology

## 2012-01-18 ENCOUNTER — Ambulatory Visit: Payer: Medicaid Other | Admitting: Psychology

## 2012-01-18 NOTE — Telephone Encounter (Signed)
Bridget [name] called to say she would not attend her appointment today secondary to feeling fatigued since taking her Seroquel at 11:00 last night.  She was hard to understand but said she plans to attend her May 8th appointment and she will be fine until then.  Treatment team discussed her case and Dr. Kathrynn Running recommended she restart the Cymbalta if she hadn't decided to do so already based on Dr. Ernest Haber recommendations.  If she can tolerate the Seroquel, she should remain on that as well.  Agree with Dr. Cristal Ford that if the paranoia returns, she should d/c this as well.  [facility] with these treatment recommendations and left a VM.

## 2012-01-19 NOTE — ED Provider Notes (Signed)
Medical screening examination/treatment/procedure(s) were performed by non-physician practitioner and as supervising physician I was immediately available for consultation/collaboration.  Flint Melter, MD 01/19/12 [phone]

## 2012-01-31 ENCOUNTER — Telehealth: Payer: Self-pay | Admitting: Family Medicine

## 2012-01-31 NOTE — Telephone Encounter (Signed)
Received call from patient that she has had a "funny feeling" on her left side tonight that has progressed into weakness in her L arm and leg.  She states she is able to walk and she denies any facial droop, slurred speech or difficulty with coordination.  Her speech is clear over the phone. Given sudden onset of symptoms I advised her to go to ED immediately, if she did not have transportation to call EMS.  Patient agrees and will proceed to ED.

## 2012-02-08 ENCOUNTER — Ambulatory Visit (INDEPENDENT_AMBULATORY_CARE_PROVIDER_SITE_OTHER): Payer: Medicaid Other | Admitting: Psychology

## 2012-02-08 DIAGNOSIS — F3189 Other bipolar disorder: Secondary | ICD-10-CM

## 2012-02-08 DIAGNOSIS — F3181 Bipolar II disorder: Secondary | ICD-10-CM

## 2012-02-08 MED ORDER — OLANZAPINE-FLUOXETINE HCL 6-25 MG PO CAPS: 1.0000 | ORAL_CAPSULE | Freq: Every evening | ORAL | Status: AC

## 2012-02-08 NOTE — Assessment & Plan Note (Signed)
Report of mood is "normal."  Affect is hypomanic.  She appears energized and slightly angry.  Denies angry feelings.  Thoughts need to be redirected somewhat.  Speech is rapid compared to last visit.  Evident of hypomanic episode.  She is interested in a medication to manage her mood and therapy.  Discussed options.  Dr. Kathrynn Running thought Symbyax was a good option.  Discussed potential side effects.  Bridget [name] wondered about paranoia.  Told her that if she experienced this, she should stop the medicine and call us.    Wanted to follow her up in two weeks but no openings.  Will ask her to follow with Bridget [name] in two weeks to check tolerability, safety and efficacy.  See patient instructions for further plan.

## 2012-02-08 NOTE — Progress Notes (Signed)
Bridget [name] presents for follow-up.  She brings her grand daughter in with her.  Since the visit with Dr. Cristal Ford, she has stopped the Seroquel secondary to paranoia (boarding up her room so no one can get in, sleeping with a razor blade underneath her pillow).  She did not restart the Cymbalta as recommended.  She stayed in a depressed phase (similar to how she presented with Dr. Cristal Ford) for about two weeks.  This past Friday she started moving out of it.  By Saturday she was allowing family members to come over and by early this week she was dusting her walls and reorganizing her house. With this increased activity, she has noticed decreased need for sleep, racing thoughts and increased energy.

## 2012-02-08 NOTE — Patient Instructions (Signed)
Please schedule a follow-up with Dr. Cristal Ford in two weeks to check on your medication.  She will specifically ask how well you are tolerating it, your safety and also if you have noticed any benefit.   Please schedule a follow-up for [facility] on June 5th at 11:00. Dr. Kathrynn Running recommended a medication called Symbyax.  It is a combination of Prozac and Zyprexa.  Take one pill around 8:00 at night.   Counseling might be a good idea.  [facility] is one recommendation.  The phone number is:  [phone].  Another option is the [facility]:  [phone].  You can also check out this website and plug in your insurance for a private psychologist:  [email or web address].

## 2012-02-23 ENCOUNTER — Ambulatory Visit: Payer: Medicaid Other | Admitting: Family Medicine

## 2012-03-07 ENCOUNTER — Ambulatory Visit: Payer: Medicaid Other | Admitting: Psychology

## 2012-03-12 ENCOUNTER — Ambulatory Visit: Payer: Medicaid Other | Admitting: Family Medicine

## 2012-04-03 ENCOUNTER — Other Ambulatory Visit: Payer: Self-pay | Admitting: Family Medicine

## 2012-04-16 ENCOUNTER — Encounter: Payer: Self-pay | Admitting: Family Medicine

## 2012-04-16 ENCOUNTER — Ambulatory Visit (INDEPENDENT_AMBULATORY_CARE_PROVIDER_SITE_OTHER): Payer: Medicaid Other | Admitting: Family Medicine

## 2012-04-16 ENCOUNTER — Other Ambulatory Visit: Payer: Self-pay | Admitting: Sports Medicine

## 2012-04-16 VITALS — BP 132/80 | HR 90 | Ht 60.0 in | Wt 227.0 lb

## 2012-04-16 DIAGNOSIS — M722 Plantar fascial fibromatosis: Secondary | ICD-10-CM

## 2012-04-16 DIAGNOSIS — M5126 Other intervertebral disc displacement, lumbar region: Secondary | ICD-10-CM

## 2012-04-16 HISTORY — DX: Plantar fascial fibromatosis: M72.2

## 2012-04-16 NOTE — Patient Instructions (Addendum)
You have inflammation in your heels/soles of feet. YOu should do stretches. You can use ice three times daily. Voltaren is a good treatment, keep taking this.\ Make sure to wear supportive shoes, avoid flip flops. Keep working on your weight.   Plantar Fasciitis (Heel Spur Syndrome) with Rehab The plantar fascia is a fibrous, ligament-like, soft-tissue structure that spans the bottom of the foot. Plantar fasciitis is a condition that causes pain in the foot due to inflammation of the tissue. SYMPTOMS   Pain and tenderness on the underneath side of the foot.   Pain that worsens with standing or walking.  CAUSES  Plantar fasciitis is caused by irritation and injury to the plantar fascia on the underneath side of the foot. Common mechanisms of injury include:  Direct trauma to bottom of the foot.   Damage to a small nerve that runs under the foot where the main fascia attaches to the heel bone.   Stress placed on the plantar fascia due to bone spurs.  RISK INCREASES WITH:   Activities that place stress on the plantar fascia (running, jumping, pivoting, or cutting).   Poor strength and flexibility.   Improperly fitted shoes.   Tight calf muscles.   Flat feet.   Failure to warm-up properly before activity.   Obesity.  PREVENTION  Warm up and stretch properly before activity.   Allow for adequate recovery between workouts.   Maintain physical fitness:   Strength, flexibility, and endurance.   Cardiovascular fitness.   Maintain a health body weight.   Avoid stress on the plantar fascia.   Wear properly fitted shoes, including arch supports for individuals who have flat feet.  PROGNOSIS  If treated properly, then the symptoms of plantar fasciitis usually resolve without surgery. However, occasionally surgery is necessary. RELATED COMPLICATIONS   Recurrent symptoms that may result in a chronic condition.   Problems of the lower back that are caused by compensating  for the injury, such as limping.   Pain or weakness of the foot during push-off following surgery.   Chronic inflammation, scarring, and partial or complete fascia tear, occurring more often from repeated injections.  TREATMENT  Treatment initially involves the use of ice and medication to help reduce pain and inflammation. The use of strengthening and stretching exercises may help reduce pain with activity, especially stretches of the Achilles tendon. These exercises may be performed at home or with a therapist. Your caregiver may recommend that you use heel cups of arch supports to help reduce stress on the plantar fascia. Occasionally, corticosteroid injections are given to reduce inflammation. If symptoms persist for greater than 6 months despite non-surgical (conservative), then surgery may be recommended.  MEDICATION   If pain medication is necessary, then nonsteroidal anti-inflammatory medications, such as aspirin and ibuprofen, or other minor pain relievers, such as acetaminophen, are often recommended.   Do not take pain medication within 7 days before surgery.   Prescription pain relievers may be given if deemed necessary by your caregiver. Use only as directed and only as much as you need.   Corticosteroid injections may be given by your caregiver. These injections should be reserved for the most serious cases, because they may only be given a certain number of times.  HEAT AND COLD  Cold treatment (icing) relieves pain and reduces inflammation. Cold treatment should be applied for 10 to 15 minutes every 2 to 3 hours for inflammation and pain and immediately after any activity that aggravates your symptoms. Use ice  packs or massage the area with a piece of ice (ice massage).   Heat treatment may be used prior to performing the stretching and strengthening activities prescribed by your caregiver, physical therapist, or athletic trainer. Use a heat pack or soak the injury in warm water.    SEEK IMMEDIATE MEDICAL CARE IF:  Treatment seems to offer no benefit, or the condition worsens.   Any medications produce adverse side effects.  EXERCISES RANGE OF MOTION (ROM) AND STRETCHING EXERCISES - Plantar Fasciitis (Heel Spur Syndrome) These exercises may help you when beginning to rehabilitate your injury. Your symptoms may resolve with or without further involvement from your physician, physical therapist or athletic trainer. While completing these exercises, remember:   Restoring tissue flexibility helps normal motion to return to the joints. This allows healthier, less painful movement and activity.   An effective stretch should be held for at least 30 seconds.   A stretch should never be painful. You should only feel a gentle lengthening or release in the stretched tissue.  RANGE OF MOTION - Toe Extension, Flexion  Sit with your right / left leg crossed over your opposite knee.   Grasp your toes and gently pull them back toward the top of your foot. You should feel a stretch on the bottom of your toes and/or foot.   Hold this stretch for __________ seconds.   Now, gently pull your toes toward the bottom of your foot. You should feel a stretch on the top of your toes and or foot.   Hold this stretch for __________ seconds.  Repeat __________ times. Complete this stretch __________ times per day.  RANGE OF MOTION - Ankle Dorsiflexion, Active Assisted  Remove shoes and sit on a chair that is preferably not on a carpeted surface.   Place right / left foot under knee. Extend your opposite leg for support.   Keeping your heel down, slide your right / left foot back toward the chair until you feel a stretch at your ankle or calf. If you do not feel a stretch, slide your bottom forward to the edge of the chair, while still keeping your heel down.   Hold this stretch for __________ seconds.  Repeat __________ times. Complete this stretch __________ times per day.  STRETCH -  Gastroc, Standing  Place hands on wall.   Extend right / left leg, keeping the front knee somewhat bent.   Slightly point your toes inward on your back foot.   Keeping your right / left heel on the floor and your knee straight, shift your weight toward the wall, not allowing your back to arch.   You should feel a gentle stretch in the right / left calf. Hold this position for __________ seconds.  Repeat __________ times. Complete this stretch __________ times per day. STRETCH - Soleus, Standing  Place hands on wall.   Extend right / left leg, keeping the other knee somewhat bent.   Slightly point your toes inward on your back foot.   Keep your right / left heel on the floor, bend your back knee, and slightly shift your weight over the back leg so that you feel a gentle stretch deep in your back calf.   Hold this position for __________ seconds.  Repeat __________ times. Complete this stretch __________ times per day. STRETCH - Gastrocsoleus, Standing  Note: This exercise can place a lot of stress on your foot and ankle. Please complete this exercise only if specifically instructed by your caregiver.  Place the ball of your right / left foot on a step, keeping your other foot firmly on the same step.   Hold on to the wall or a rail for balance.   Slowly lift your other foot, allowing your body weight to press your heel down over the edge of the step.   You should feel a stretch in your right / left calf.   Hold this position for __________ seconds.   Repeat this exercise with a slight bend in your right / left knee.  Repeat __________ times. Complete this stretch __________ times per day.  STRENGTHENING EXERCISES - Plantar Fasciitis (Heel Spur Syndrome)  These exercises may help you when beginning to rehabilitate your injury. They may resolve your symptoms with or without further involvement from your physician, physical therapist or athletic trainer. While completing these  exercises, remember:   Muscles can gain both the endurance and the strength needed for everyday activities through controlled exercises.   Complete these exercises as instructed by your physician, physical therapist or athletic trainer. Progress the resistance and repetitions only as guided.  STRENGTH - Towel Curls  Sit in a chair positioned on a non-carpeted surface.   Place your foot on a towel, keeping your heel on the floor.   Pull the towel toward your heel by only curling your toes. Keep your heel on the floor.   If instructed by your physician, physical therapist or athletic trainer, add ____________________ at the end of the towel.  Repeat __________ times. Complete this exercise __________ times per day. STRENGTH - Ankle Inversion  Secure one end of a rubber exercise band/tubing to a fixed object (table, pole). Loop the other end around your foot just before your toes.   Place your fists between your knees. This will focus your strengthening at your ankle.   Slowly, pull your big toe up and in, making sure the band/tubing is positioned to resist the entire motion.   Hold this position for __________ seconds.   Have your muscles resist the band/tubing as it slowly pulls your foot back to the starting position.  Repeat __________ times. Complete this exercises __________ times per day.  Document Released: 09/19/2005 Document Revised: 09/08/2011 Document Reviewed: 01/01/2009 [facility] Patient Information 2012 [place], [place].

## 2012-04-16 NOTE — Progress Notes (Signed)
  Subjective:    Patient ID: Bridget [name], female    DOB: 1960-11-28, 51 y.o.   MRN: [id number]  HPI  1. pain in feet. Patient states she has had pains in her bilateral soles and posterior heels. This has worsened over the past few months. She has experienced this previously. Pain increases with long periods of standing and walking. Improvement rest. Has not tried any medication for therapy.   Denies any swelling, redness, pain at rest, ankle or calf pain, numbness, tingling, trauma, back pain.  Review of Systems See HPI otherwise negative.  reports that she has quit smoking. Her smoking use included Cigarettes. She has a 10 pack-year smoking history. She does not have any smokeless tobacco history on file.     Objective:   Physical Exam  Vitals reviewed. Constitutional: She is oriented to person, place, and time. She appears well-developed and well-nourished. No distress.  HENT:  Head: Normocephalic and atraumatic.  Mouth/Throat: Oropharynx is clear and moist.  Eyes: EOM are normal.  Pulmonary/Chest: Effort normal.  Musculoskeletal: Normal range of motion. She exhibits no edema and no tenderness.       Bilateral feet appear within normal limits, no swelling, redness, deformity. Mild tenderness to palpation and bilateral plantar aspects. 2+ DP pulses bilaterally.  Neurological: She is alert and oriented to person, place, and time.  Skin: No rash noted.  Psychiatric: She has a normal mood and affect.       Speech is normal.          Assessment & Plan:

## 2012-04-16 NOTE — Assessment & Plan Note (Signed)
Pain is consistent with plantar fasciitis. No triggering symptoms identified. Discussed with patient conservative therapy including supportive footwear, avoiding exacerbating activities, scheduled icing and continuation of voltaren twice a day. Patient instructed on stretching exercises and calf raises. Discussed follow up with sports medicine or orthopedics for worsening symptoms as steroid ejection may be beneficial.

## 2012-04-19 ENCOUNTER — Other Ambulatory Visit: Payer: Medicaid Other

## 2012-04-20 ENCOUNTER — Encounter: Payer: Self-pay | Admitting: Family Medicine

## 2012-04-20 ENCOUNTER — Telehealth: Payer: Self-pay | Admitting: Family Medicine

## 2012-04-20 NOTE — Telephone Encounter (Signed)
Bridget [name] is requesting another out of work note for today thru Tuesday 7/23.  Please call when ready for pickup.

## 2012-04-20 NOTE — Telephone Encounter (Signed)
Patient states feet are still bothering her when she stands too long. She is elevating as she was instructed .  Continues with pain . Advised that I will send message to Dr. Cristal Ford and will be first of  next week probably before doctor responds since she is not in clinic today.. She voices understanding.

## 2012-04-20 NOTE — Telephone Encounter (Signed)
I have advised icing the feet three times daily, motrin 400mg  TID for 2-3 days, supportive shoes.

## 2012-04-20 NOTE — Telephone Encounter (Signed)
Correction: she is on voltaren twice a day. Do not add motrin. Follow up with sports medicine if not improving in 1-2 weeks. Please inform patient.

## 2012-04-20 NOTE — Telephone Encounter (Signed)
Patient notified of message and that she can pick up note for work. Appointment scheduled 07/29.

## 2012-04-20 NOTE — Telephone Encounter (Signed)
Please advise about extending out of work note. If Dr. Cristal Ford can provide I can print and give to patient.

## 2012-04-20 NOTE — Telephone Encounter (Signed)
OK for light work, restrict standing > 30 min-1 hr at a time until after 04/24/12. Please have patient f/u in one week.

## 2012-04-24 ENCOUNTER — Ambulatory Visit
Admission: RE | Admit: 2012-04-24 | Discharge: 2012-04-24 | Disposition: A | Discharge status: Home / Self Care | Payer: Medicaid Other | Source: Ambulatory Visit | Attending: Sports Medicine | Admitting: Sports Medicine

## 2012-04-24 VITALS — BP 135/66 | HR 69

## 2012-04-24 DIAGNOSIS — M5126 Other intervertebral disc displacement, lumbar region: Secondary | ICD-10-CM

## 2012-04-24 MED ORDER — IOHEXOL 180 MG/ML  SOLN
1.0000 mL | Freq: Once | INTRAMUSCULAR | Status: AC | PRN
  Administered 2012-04-24: 1 mL via EPIDURAL

## 2012-04-24 MED ORDER — METHYLPREDNISOLONE ACETATE 40 MG/ML INJ SUSP (RADIOLOG
120.0000 mg | Freq: Once | INTRAMUSCULAR | Status: AC
  Administered 2012-04-24: 120 mg via EPIDURAL

## 2012-04-30 ENCOUNTER — Ambulatory Visit: Payer: Medicaid Other | Admitting: Family Medicine

## 2012-05-10 ENCOUNTER — Ambulatory Visit: Payer: Medicaid Other | Admitting: Family Medicine

## 2012-06-08 ENCOUNTER — Telehealth: Payer: Self-pay | Admitting: Family Medicine

## 2012-06-08 NOTE — Telephone Encounter (Signed)
Spoke with patient and informed her that pain medication cannot be filled until that appointment. She has no showed her last 2 appointments here and has not been seen since July. Patient was very upset and angry.

## 2012-06-08 NOTE — Telephone Encounter (Signed)
Patient is out of her pain medications as of yesterday.  I scheduled her an appt with Bridget [name] on Wed, 09/11 but she says that she cannot wait until then.  She wants to know if she can get enough to last until Wed.

## 2012-06-13 ENCOUNTER — Encounter: Payer: Self-pay | Admitting: Family Medicine

## 2012-06-13 ENCOUNTER — Ambulatory Visit (INDEPENDENT_AMBULATORY_CARE_PROVIDER_SITE_OTHER): Payer: Medicaid Other | Admitting: Family Medicine

## 2012-06-13 VITALS — BP 117/65 | HR 64 | Temp 99.1°F | Ht 60.0 in | Wt 230.0 lb

## 2012-06-13 DIAGNOSIS — M549 Dorsalgia, unspecified: Secondary | ICD-10-CM

## 2012-06-13 DIAGNOSIS — Z23 Encounter for immunization: Secondary | ICD-10-CM

## 2012-06-13 DIAGNOSIS — R440 Auditory hallucinations: Secondary | ICD-10-CM

## 2012-06-13 DIAGNOSIS — F3189 Other bipolar disorder: Secondary | ICD-10-CM

## 2012-06-13 DIAGNOSIS — M722 Plantar fascial fibromatosis: Secondary | ICD-10-CM

## 2012-06-13 DIAGNOSIS — F3181 Bipolar II disorder: Secondary | ICD-10-CM

## 2012-06-13 DIAGNOSIS — R443 Hallucinations, unspecified: Secondary | ICD-10-CM

## 2012-06-13 NOTE — Assessment & Plan Note (Signed)
New problem. Not concerning to patient currently.Noncompliant with medications due to side effects.  Patient plans to find a psychiatrist. I agree this is very important, may be demonstrating atypical depression or perhaps schizoaffective DO with her known history of mania/bipolar disorder. Provided contact info for [facility], she agrees to call. Follow up in next 1 month.

## 2012-06-13 NOTE — Assessment & Plan Note (Addendum)
Followed at [facility] clinic. Discussed her need for monthly visits, I agree she will need regular visits for pain management as her post-operative course from hip replacement may have been contributory to worsened mood disorder, amongst other things.

## 2012-06-13 NOTE — Assessment & Plan Note (Signed)
Depression episode seems to have subsided, but now describing auditory hallucinations concerning for psychotic features. She is off all psychotropic medications by choice.  Patient does not currently endorse any thoughts of harm to self or others. She offers her own plan of finding a psychiatrist, and we discussed [facility]-contact info provided. She contracts verbally for safety with a plan to call EMS for decompensation.

## 2012-06-13 NOTE — Assessment & Plan Note (Signed)
Reinforced weight loss, TID ice therapy and continue NSAIDs. Demonstrated stretching techniques. She will return to orthopedics for follow up.

## 2012-06-13 NOTE — Assessment & Plan Note (Signed)
Decompensated. New interest in exercise currently. She is off psychotropic drugs, will check lipids and screening labs at next visit as pt refused today. F/u in 1-2 months.

## 2012-06-13 NOTE — Progress Notes (Signed)
  Subjective:    Patient ID: Bridget [name], female    DOB: 04-Nov-1960, 51 y.o.   MRN: [id number]  HPI  1. Bipolar f/u. Patient has stopped taking any medications (last rx cymbalta and symbyax). Medications caused paranoia, she was sleeping with a knife under her pillow and they made her too lethargic. States her depression is much improved now without meds. She is exercising now, and functioning much better. Her only complaint is she started hearing voices about 3 weeks ago. She heard her daughter's voice calling her name while she was alone in the house, also heard her own voice respond but didn't think she said anything. Denies hopelessness, thoughts of self harm/HI, no delusions or paranoia currently.   2. Chronic pain. Goes to [facility]. States that medicaid won't pay for her monthly pain clinic visits and requests a letter be sent. Her pain is stable currently-taking norco, robaxin, voltaren. She does receive other testing at pain clinic too.  3. Obesity. Weight is elevated after hip replacment, but states she has lost 5 lbs now since restarting exercise.  4. Foot pain. Plantar pain stable, worse at night and when standing without her shoes. Mentioned this to Dr. Farris Has recently. Doing infrequent ice and on voltaren. No injuries, edema, redness.   Review of Systems See HPI otherwise negative.  reports that she has quit smoking. Her smoking use included Cigarettes. She has a 10 pack-year smoking history. She does not have any smokeless tobacco history on file.     Objective:   Physical Exam  Vitals reviewed. Constitutional: She is oriented to person, place, and time. She appears well-developed and well-nourished. No distress.       obese  HENT:  Head: Normocephalic and atraumatic.  Pulmonary/Chest: Effort normal.  Musculoskeletal:       Left heal mild nodularity at plantar fascia, mild TTP. No edema or erythema. No skin breakdown. Sensation to light touch intact.    Neurological: She is alert and oriented to person, place, and time. She exhibits normal muscle tone. Coordination normal.  Psychiatric: She has a normal mood and affect. Her behavior is normal. Thought content normal.       Patient smiles. Speech normal. Normal dress, appears more put-together than previous.       Assessment & Plan:

## 2012-06-13 NOTE — Patient Instructions (Addendum)
Glad your sadness is improved. Keep doing a good job on your weight loss. If you have worsening of depression, are a danger to yourself or others, or cannot function then cal 911 or go to ER or to [facility].  Please find a psychiatrist for hearing voices:  [facility] good [facility]:  [address], [place], [place] [address]. If you need [facility], please call our referral department at [phone].  Please make an appointment in next 2 months for check up and blood work.

## 2012-07-11 ENCOUNTER — Encounter (HOSPITAL_COMMUNITY): Payer: Self-pay | Admitting: *Deleted

## 2012-07-11 ENCOUNTER — Emergency Department (HOSPITAL_COMMUNITY)
Admission: EM | Admit: 2012-07-11 | Discharge: 2012-07-11 | Disposition: A | Discharge status: Home / Self Care | Payer: Medicaid Other | Source: Home / Self Care

## 2012-07-11 DIAGNOSIS — G894 Chronic pain syndrome: Secondary | ICD-10-CM

## 2012-07-11 MED ORDER — HYDROCODONE-ACETAMINOPHEN 10-325 MG PO TABS: 1.0000 | ORAL_TABLET | Freq: Four times a day (QID) | ORAL | Status: DC | PRN

## 2012-07-11 NOTE — ED Provider Notes (Signed)
History     CSN: [id number]  Arrival date & time 07/11/12  1908   None     Chief Complaint  Patient presents with  . Back Pain    (Consider location/radiation/quality/duration/timing/severity/associated sxs/prior treatment) HPI Comments: 51 year old female patient with history of chronic pain syndrome. Pain is primarily musculoskeletal and potentially neurogenic from osteo-arthritis. The pain is located in her back her legs both feet and posterior neck. These pains are worse with movement, prolonged standing, ambulation. There is no change in her pain symptomatology today. There are no new symptoms,. She has history of depression, bipolar 2 disorder, osteoarthritis, obesity, and history of tobacco use. She attends a pain clinic however missed her appointment this week and she is here to get her refill her narcotic analgesics. She states she has an appointment Monday.    Past Medical History  Diagnosis Date  . Depression   . Bipolar II disorder   . Osteoarthritis   . Obesity, morbid     Past Surgical History  Procedure Date  . Rotator cuff repair     BILATERAL  . Bone spurs     REMOVED FROM SHOULDERS  . Eye surgery     STY REMOVED LEFT EYE  1992  . 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 Bipolar disorder Sister   . Hypotension Sister   . Hypotension Brother   . Hypotension Other   . Hypotension Mother   . Hypotension Maternal Aunt     History  Substance Use Topics  . Smoking status: Former Smoker -- 0.5 packs/day for 20 years    Types: Cigarettes  . Smokeless tobacco: Not on file  . Alcohol Use: No    OB History    Grav Para Term Preterm Abortions TAB SAB Ect Mult Living                  Review of Systems  Constitutional: Negative for fever, chills and activity change.  HENT:  Negative.   Respiratory: Negative.   Cardiovascular: Negative.   Musculoskeletal:       As per HPI  Skin: Negative for color change, pallor and rash.  Neurological: Negative.     Allergies  Oxycodone hcl er and Tramadol  Home Medications   Current Outpatient Rx  Name Route Sig Dispense Refill  . DICLOFENAC SODIUM 50 MG PO TBEC Oral Take 50 mg by mouth 2 (two) times daily.      . DULOXETINE HCL 60 MG PO CPEP Oral Take 1 capsule (60 mg total) by mouth daily. 30 capsule 11  . HYDROCODONE-ACETAMINOPHEN 10-325 MG PO TABS Oral Take 1 tablet by mouth every 4 (four) hours as needed. For pain relief      Per ortho-hip replacement, OA  . HYDROCODONE-ACETAMINOPHEN 10-325 MG PO TABS Oral Take 1 tablet by mouth every 6 (six) hours as needed for pain. 15 tablet 0  . METHOCARBAMOL 750 MG PO TABS Oral Take 750 mg by mouth 3 (three) times daily.        BP 145/65  Pulse 74  Temp 98.8 F (37.1 C) (Oral)  Resp 18  SpO2 100%  LMP 06/12/2012  Physical Exam  Constitutional: She is oriented to person, place, and time. She appears well-developed and well-nourished. No distress.  HENT:  Head: Normocephalic and atraumatic.  Right  Ear: External ear normal.  Eyes: EOM are normal. Pupils are equal, round, and reactive to light.  Neck: Normal range of motion. Neck supple.  Cardiovascular: Normal rate and normal heart sounds.   Pulmonary/Chest: Effort normal and breath sounds normal. No respiratory distress. She has no wheezes.  Musculoskeletal: She exhibits tenderness. She exhibits no edema.  Lymphadenopathy:    She has no cervical adenopathy.  Neurological: She is alert and oriented to person, place, and time. No cranial nerve deficit.  Skin: Skin is warm and dry.    ED Course  Procedures (including critical care time)  Labs Reviewed - No data to display No results found.   1. Chronic pain syndrome       MDM  Norco 10/325 one every 4-6 hours when necessary pain #15. She is advised that  we will not be able to prescribe an additional amount to to her contract with the pain clinic and the fact she is receiving them from other physicians. Keep her appointment next week with pain clinic. D&C control substance prescription utilization was reviewed. She receives oxycodone and hydrocodone every 3-4 weeks.         Hayden Rasmussen, NP 07/11/12 2034

## 2012-07-11 NOTE — ED Notes (Signed)
C/o low back that runs down both legs into her feet onset 3 mos. Ago.  Has seen Dr. Farris Has 1 month ago and Dr. Jule Ser 5 mos. ago.  Has tried ice, Norco 10/325, Robaxin 750 mg. And Diclofenac and rest without relief.

## 2012-07-11 NOTE — ED Provider Notes (Signed)
Medical screening examination/treatment/procedure(s) were performed by non-physician practitioner and as supervising physician I was immediately available for consultation/collaboration.  Fabricio Endsley, M.D.   Cambria Osten C Cleora Karnik, MD 07/11/12 2254 

## 2012-07-16 ENCOUNTER — Telehealth: Payer: Self-pay | Admitting: Psychology

## 2012-07-16 NOTE — Telephone Encounter (Signed)
Patient called and left two VM on Thursday evening (7:58 and 8:07 p.m.).  I was on vacation Friday so just heard them Monday morning.  She was crying on the phone stating that she was depressed again and in need of help.  Called her today and she reported she was doing much better.  She said her mood has been up and down and it just "came out of the blue" on Thursday when she was having a very bad day.    She is interested in returning for mental health care.  Last note from Dr. Cristal Ford indicated a referral to [facility].  Next available for [facility]DC is November 20th.  I would like her to get seen sooner than that and she agreed.  She took the number for [facility] and was able to repeat it back to me.  I asked her to call me back if she had any trouble getting in with them.  I also strongly suggested that she get her records from [facility] to take to her appointment at [facility].  She expressed an understanding.  Will forward to PCP.

## 2012-09-14 IMAGING — CR DG PELVIS 1-2V
1 series · 1 of 1 positions shown · non-contrast
Comparison: None.

CLINICAL DATA: 50-year-old female status post right hip
replacement.

PELVIS - 1-2 VIEW

[view not recorded]
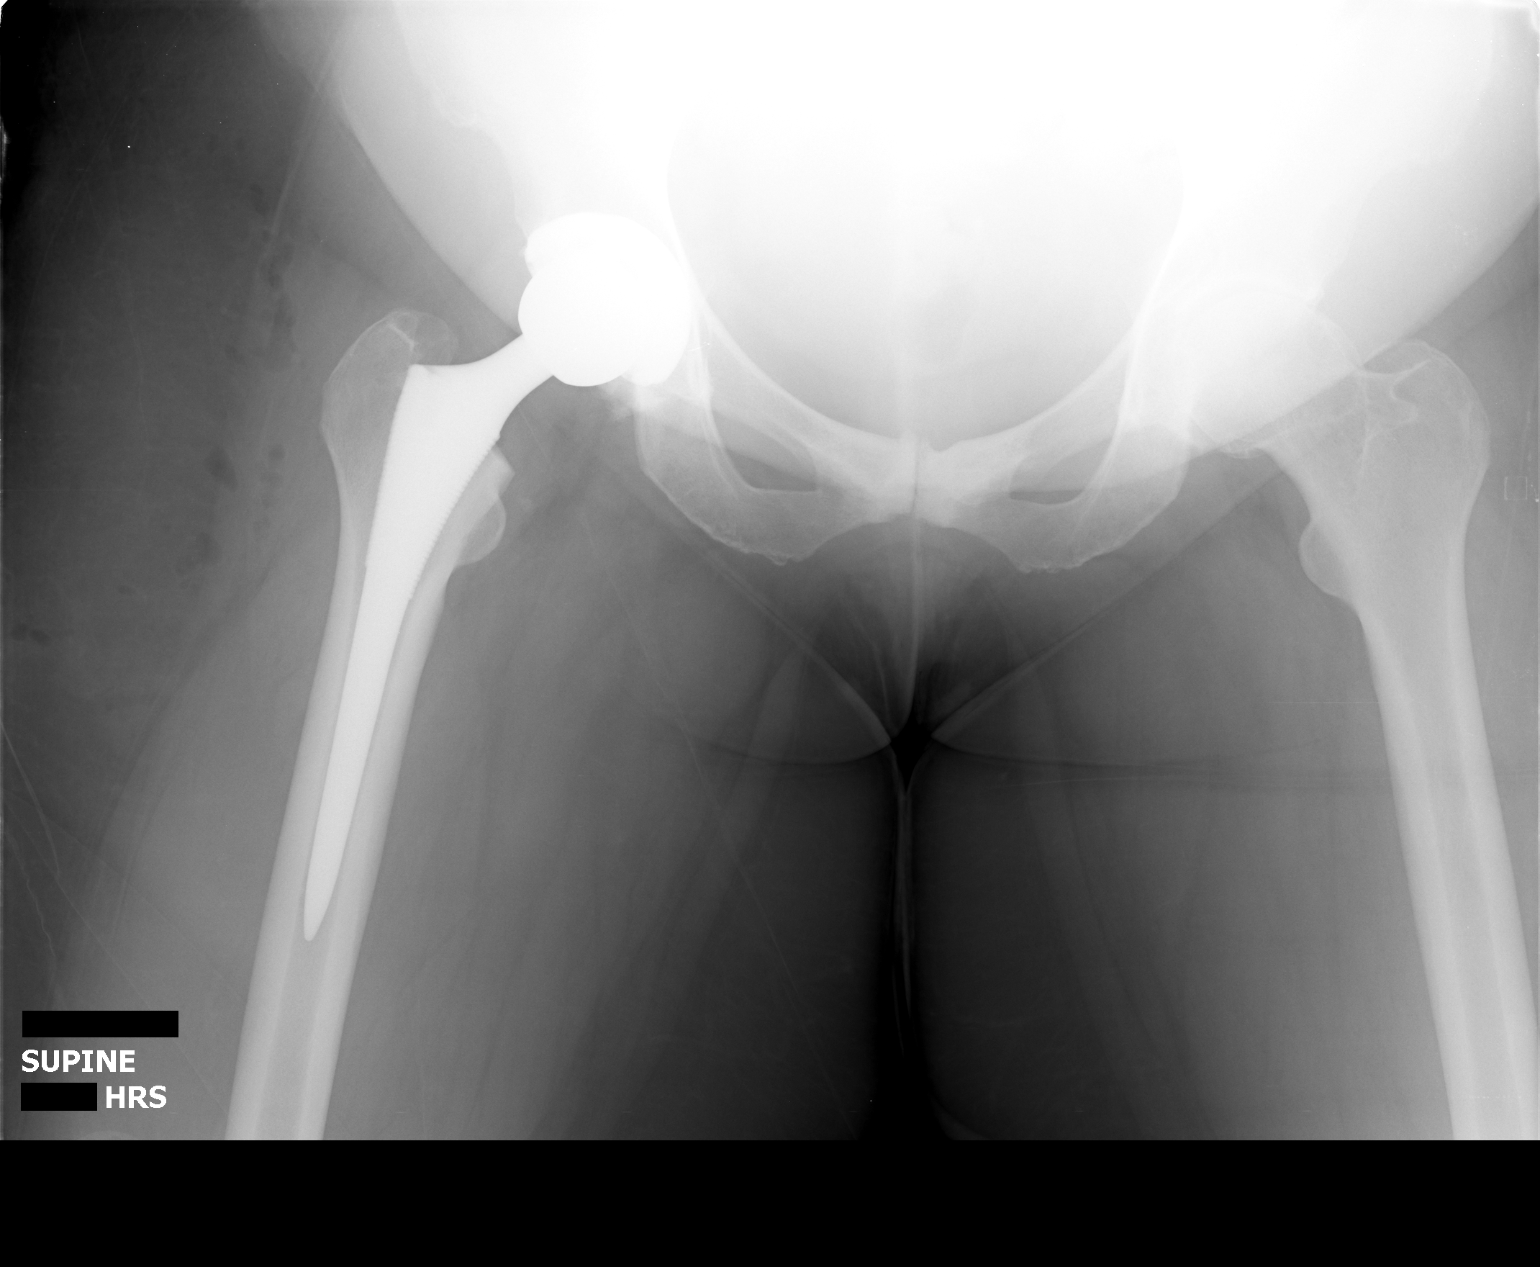

[1 of 1 positions shown; findings below may reference images not displayed]

FINDINGS: Portable supine AP view 4539 hours.  Bipolar right hip
arthroplasty.  Component appear normally aligned.  Mild
postoperative changes to the surrounding soft tissues.  No
unexpected osseous finding.
IMPRESSION: Right bipolar hip arthroplasty with no adverse features.

## 2012-09-17 ENCOUNTER — Ambulatory Visit (INDEPENDENT_AMBULATORY_CARE_PROVIDER_SITE_OTHER): Payer: Medicaid Other | Admitting: Family Medicine

## 2012-09-17 ENCOUNTER — Encounter: Payer: Self-pay | Admitting: Family Medicine

## 2012-09-17 VITALS — BP 120/72 | HR 91 | Temp 99.2°F | Ht 61.0 in | Wt 203.4 lb

## 2012-09-17 DIAGNOSIS — M549 Dorsalgia, unspecified: Secondary | ICD-10-CM

## 2012-09-17 DIAGNOSIS — F3189 Other bipolar disorder: Secondary | ICD-10-CM

## 2012-09-17 DIAGNOSIS — F3181 Bipolar II disorder: Secondary | ICD-10-CM

## 2012-09-17 DIAGNOSIS — F17200 Nicotine dependence, unspecified, uncomplicated: Secondary | ICD-10-CM

## 2012-09-17 MED ORDER — HYDROCODONE-ACETAMINOPHEN 10-325 MG PO TABS: 1.0000 | ORAL_TABLET | Freq: Four times a day (QID) | ORAL | Status: DC | PRN

## 2012-09-17 MED ORDER — PROMETHAZINE HCL 25 MG PO TABS: 25.0000 mg | ORAL_TABLET | Freq: Three times a day (TID) | ORAL | Status: DC | PRN

## 2012-09-17 MED ORDER — METHOCARBAMOL 750 MG PO TABS: 750.0000 mg | ORAL_TABLET | Freq: Three times a day (TID) | ORAL | Status: DC

## 2012-09-17 MED ORDER — DICLOFENAC SODIUM 50 MG PO TBEC: 50.0000 mg | DELAYED_RELEASE_TABLET | Freq: Three times a day (TID) | ORAL | Status: DC | PRN

## 2012-09-17 MED ORDER — BUPROPION HCL ER (XL) 150 MG PO TB24: 150.0000 mg | ORAL_TABLET | Freq: Every day | ORAL | Status: DC

## 2012-09-17 NOTE — Progress Notes (Signed)
  Subjective:    Patient ID: Bridget [name], female    DOB: 1960-12-08, 51 y.o.   MRN: [id number]  HPI  1. Left hip pain. Worsened recently. She has seen Dr. Farris Has who recommends she consider left hip replacement. Had the right replaced last year. Patient is very emotional today and having trouble coping with her limited walking. She is taking robaxin and diclofenac for pain. Denies edema, fever, falls, numbness.   2. Chronic pain/fibromyalgia. Reports that she was fired from pain clinic due to smoking marijuana on her birthday and positive UDS. Was given short supply of vicodin at urgent care recently.  She cannot get her oxycodone or vicodin and has run out as of yesterday. She is not taking cymbalta due to mood effects and bipolar disorder, refuses this.   3. Mood disorder. Has not established at [facility] yet, but knows she should. Is distressed taht her grandchildren have seen her crying recently. She is resistant to medications though, due to side effects like paranoia. Has not taken symbyax or any of the last 3 psychotropic drugs suggested at [facility]. She has considering cutting at her hip joint, but denies active SI/HI or thoughts of self harm.   4. Tobacco abuse. Started smoking again. Wants to restart wellbutrin which helped her in the past without causing untoward mood effects.   Review of Systems See HPI otherwise negative.  reports that she has been smoking Cigarettes.  She has a 10 pack-year smoking history. She does not have any smokeless tobacco history on file.     Objective:   Physical Exam  Vitals reviewed. Constitutional: She is oriented to person, place, and time. She appears well-developed and well-nourished. No distress.       Pacing, tearful at times.  HENT:  Head: Normocephalic and atraumatic.  Mouth/Throat: Oropharynx is clear and moist.  Eyes: EOM are normal. Pupils are equal, round, and reactive to light.  Neck: Neck supple.  Cardiovascular: Normal rate, regular  rhythm and normal heart sounds.   No murmur heard. Neurological: She is alert and oriented to person, place, and time.  Skin: No rash noted. She is not diaphoretic.  Psychiatric:       Tearful, pacing. Thoughts are linear. Normal speech. No hallucination, delusions. No SI/HI.       Assessment & Plan:

## 2012-09-17 NOTE — Assessment & Plan Note (Signed)
Has restarted cigarettes. I agree to refill wellbutrin per patient request which she previously tolerated well. There is risk of mood side effects, she agrees to f/u in one week for safety check and call 911 for any SI/HI.

## 2012-09-17 NOTE — Assessment & Plan Note (Signed)
Uncontrolled depression currently. This waxes and wanes. She will not accept another psychotropic drug currently, except for wellbutrin which she did fine with in the past. She agrees to seek specialty care at [facility] which was suggested at last Mood disorder clinic appointment. Pt still has Dr. Carola Rhine phone number to use if needed also, which seems to give her some hope also. She verbally contracts for safety today. F/u in one week.

## 2012-09-17 NOTE — Assessment & Plan Note (Signed)
Unfortunately she did use marijuana and has been fired from previous clinic. She is remorseful and has chronic severe hip arthritis and back pain, fibromyalgia. I will not refill her oxycodone, but to prevent abrupt withdrawal and mood disorder worsening, will give short term vicodin daily prn. No more will be given from this office. Will attempt pain referral to [facility] per her request.

## 2012-09-17 NOTE — Patient Instructions (Addendum)
I am sorry you are having worse pain. Will try to make a new pain medicine referral. You can try vicodin for worst days. Lets try wellbutrin again for your depression and cigarette smoking. Make appointment in one week for check up. Please call 911 if you have thoughts of hurting yourself. Most important to Call [facility].  [address], [place] [address] [phone]

## 2012-09-24 ENCOUNTER — Telehealth: Payer: Self-pay | Admitting: Family Medicine

## 2012-09-24 ENCOUNTER — Ambulatory Visit (INDEPENDENT_AMBULATORY_CARE_PROVIDER_SITE_OTHER): Payer: Medicaid Other | Admitting: Family Medicine

## 2012-09-24 ENCOUNTER — Encounter: Payer: Self-pay | Admitting: Family Medicine

## 2012-09-24 VITALS — BP 118/84 | HR 89 | Temp 97.8°F | Ht 61.0 in | Wt 202.0 lb

## 2012-09-24 DIAGNOSIS — M549 Dorsalgia, unspecified: Secondary | ICD-10-CM

## 2012-09-24 MED ORDER — HYDROCODONE-ACETAMINOPHEN 5-500 MG PO TABS: 1.0000 | ORAL_TABLET | Freq: Four times a day (QID) | ORAL | Status: DC | PRN

## 2012-09-24 NOTE — Patient Instructions (Signed)
I am sorry you are in so much pain. We are actively attempting to get you an appointment with the pain clinic.  I am prescribing you Vicodin 5-500 today to get you through until your appointment with Dr. Cristal Ford on the 27th. We need to be able to wean you off of these medications.  Please attend all orthopedic appointments and left hip replacement is probably what you are ultimately going to need to cure your pain.

## 2012-09-24 NOTE — Telephone Encounter (Signed)
Pt is calling again and is in a great deal of pain.

## 2012-09-24 NOTE — Progress Notes (Signed)
Subjective:     Patient ID: Bridget [name], female   DOB: 04/23/1961, 51 y.o.   MRN: [id number]  HPI Back and Hip pain: Patient is standing up, leaning over table in tears.  Patient has returned to the clinic toady after her appointment on the 16th,December for the same problem. The physician at that time had given her pain medications that should have lasted her until tomorrow and did not. The physician also told the patient no more pain meds would be given at the current dosage and she would have to attempt to go to a pain clinic ([place]). The referral to the clinic has been placed and she should be called by them soon. She has been dismissed from a one pain clinic for non-compliance, d/t THC use. Her pain is constant shooting pain down from hip to the her heel on the left side 10/10. She reports she needs her pain medication every 4 hours, not every 6 hours. She does take her voltaren as prescribed.   Review of Systems See above HPI    Objective:   Physical Exam Gen: Patient is in tears and standing leaning over table. Accompanied by her grandson. CV: RRR. No mur mur Lungs: CTAB MS: PAtietn has great difficulty getting up on the exam table. Once she lays back she reports pain in her left hip. Straight leg raises bilaterally cause pain in her lower back, but lowering her leg causes more pain than raising. FABRE on the left is POSITIVE for hip dysfunction.  Gait: Limping and slow gait. Placing weight with stepping up causes pain.

## 2012-09-24 NOTE — Assessment & Plan Note (Signed)
-   Patient given lower dose pain medication today,  Vicodin 5-500 #20 today to avoid withdraw symptoms. This is  to get her to Dr. Cristal Ford appointment on the 27th. -  referral has been sent and received, they should be calling her shortly with an appointment. - Discussed in length the need to decrease and eventually discontinue her narcotics. She understands the need to correct the problem and not continuously medicate her, although she is not happy with the decreased dose of medication today. - F/U with scheduled appointment on the 27th

## 2012-09-24 NOTE — Telephone Encounter (Signed)
Patient is calling for a refill on her pain meds (Norco).  She would also like for the instructions to be for her to take every 4 hours instead of every 6 hours.

## 2012-09-24 NOTE — Telephone Encounter (Signed)
Returned call to patient.  C/o back and leg pain.  Leg pain worse on left.  Patient states she has been unable to sleep due to pain.  Has an appt with Dr. Cristal Ford on 09/28/12.  Scheduled an appt for today with Dr. Claiborne Billings at 2:00 pm to be evaluated for back pain.  Gaylene Brooks, RN

## 2012-09-28 ENCOUNTER — Ambulatory Visit (INDEPENDENT_AMBULATORY_CARE_PROVIDER_SITE_OTHER): Payer: Medicaid Other | Admitting: Family Medicine

## 2012-09-28 ENCOUNTER — Encounter: Payer: Self-pay | Admitting: Family Medicine

## 2012-09-28 VITALS — BP 135/77 | HR 95 | Ht 61.0 in | Wt 203.0 lb

## 2012-09-28 DIAGNOSIS — F17200 Nicotine dependence, unspecified, uncomplicated: Secondary | ICD-10-CM

## 2012-09-28 DIAGNOSIS — F1920 Other psychoactive substance dependence, uncomplicated: Secondary | ICD-10-CM

## 2012-09-28 DIAGNOSIS — F1120 Opioid dependence, uncomplicated: Secondary | ICD-10-CM

## 2012-09-28 DIAGNOSIS — M549 Dorsalgia, unspecified: Secondary | ICD-10-CM

## 2012-09-28 DIAGNOSIS — F3189 Other bipolar disorder: Secondary | ICD-10-CM

## 2012-09-28 DIAGNOSIS — F3181 Bipolar II disorder: Secondary | ICD-10-CM

## 2012-09-28 MED ORDER — HYDROCODONE-ACETAMINOPHEN 5-500 MG PO TABS: 1.0000 | ORAL_TABLET | Freq: Three times a day (TID) | ORAL | Status: AC | PRN

## 2012-09-28 NOTE — Patient Instructions (Signed)
Do your best to stay active. Space the vicodin to every 8 hours. We cannot keep prescribing the narcotics. You signed the drug policy today.  Keep doing heating pad and TENS unit. Pick up wellbutrin when you can.  Get into [facility] for therapy as soon as you can. Make an appointment in 2-3 weeks for check up.

## 2012-09-28 NOTE — Assessment & Plan Note (Signed)
Patient wants to try wellbutrin again, warned to monitor for mood effects and agrees to notify MD of changes.

## 2012-09-28 NOTE — Assessment & Plan Note (Signed)
Pending pain clinic referral. She is currently weaned down to vicodin 5/500 mg TID. Advised patient space these out. Will check UDS, if positive no further narcotics will be prescribed from this office. Has not tolerated cymbalta. She will continue voltaren, TENS unit. Follow up in 3 weeks. Controlled substances contract signed today.

## 2012-09-28 NOTE — Progress Notes (Signed)
  Subjective:    Patient ID: Bridget [name], female    DOB: 05-Apr-1961, 51 y.o.   MRN: [id number]  HPI  1. F/u back pain. Comes and goes. Sometimes relates worsening to her depression. Limits her ability to do things like cook/clean, but again this is hard to sort out between her depression and pain condition. She still sees orthopedics regularly, but no pain medication given there. Still awaiting pain clinic referral. Her vicodin was weaned from 10 mg to 5 mg and she seems okay with this. Took last pill today.  Denies any falls, weakness or numbness in feet, incontinence.  Denies any drug use since she had marijuana between 2-3 weeks ago.  2. Tobacco abuse. Has not picked up wellbutrin yet. Does not think it caused mood side effects last time she used it. She decides this is worth the risk and agrees to discontinue and call MD if she notices changes.  3. Depression. Mood is stable currently. She plans to walk into [facility] soon since her daughter has better work schedule and can provide transportation.   Review of Systems See HPI otherwise negative.  reports that she has been smoking Cigarettes.  She has a 10 pack-year smoking history. She does not have any smokeless tobacco history on file.     Objective:   Physical Exam  Vitals reviewed. Constitutional: She is oriented to person, place, and time. She appears well-developed and well-nourished. No distress.  HENT:  Head: Normocephalic and atraumatic.  Eyes: EOM are normal. Pupils are equal, round, and reactive to light.  Pulmonary/Chest: Effort normal.  Musculoskeletal:       Bilateral lumbar TTP in paraspinal musculature.   Neurological: She is alert and oriented to person, place, and time.  Skin: She is not diaphoretic.  Psychiatric: Her behavior is normal. Thought content normal.       Affect is flat.        Assessment & Plan:

## 2012-09-28 NOTE — Assessment & Plan Note (Signed)
Encouraged to f/u at [facility]. Patient plans to do this soon. No acute symptoms or complaints today.

## 2012-09-29 LAB — DRUG SCREEN, URINE
Benzodiazepines.: NEGATIVE
Creatinine,U: 198.7 mg/dL
Opiates: POSITIVE — AB
Phencyclidine (PCP): NEGATIVE
Propoxyphene: NEGATIVE

## 2012-10-10 ENCOUNTER — Telehealth: Payer: Self-pay | Admitting: Family Medicine

## 2012-10-10 NOTE — Telephone Encounter (Signed)
Wants to know when she can get into the pain clinic - needs something asap

## 2012-10-15 NOTE — Telephone Encounter (Signed)
We put in new pain clinic referral, hoping to get into [facility] [facility] (she stopped going to the one in [place]). I cannot prescribe any additional meds here, she tested positive on THC on her UDS. Can you check status of pain clinic referral?

## 2012-10-15 NOTE — Telephone Encounter (Signed)
Patient has an appointment scheduled with [facility]  10/27/2011 @ 9:40am with Dr. Laban Emperor.  Ileana Ladd

## 2012-10-17 ENCOUNTER — Ambulatory Visit: Payer: Medicaid Other | Admitting: Family Medicine

## 2012-10-17 NOTE — Telephone Encounter (Signed)
Patient no-showed visit today. Please let her know about pain clinic referral if she doesn't already.

## 2012-10-18 ENCOUNTER — Other Ambulatory Visit (HOSPITAL_COMMUNITY)
Admission: RE | Admit: 2012-10-18 | Discharge: 2012-10-18 | Disposition: A | Discharge status: Home / Self Care | Payer: Medicaid Other | Source: Ambulatory Visit | Attending: Family Medicine | Admitting: Family Medicine

## 2012-10-18 ENCOUNTER — Ambulatory Visit (INDEPENDENT_AMBULATORY_CARE_PROVIDER_SITE_OTHER): Payer: Medicaid Other | Admitting: Family Medicine

## 2012-10-18 ENCOUNTER — Encounter: Payer: Self-pay | Admitting: Family Medicine

## 2012-10-18 VITALS — BP 116/86 | HR 88 | Temp 99.8°F | Ht 61.0 in | Wt 204.1 lb

## 2012-10-18 DIAGNOSIS — Z1151 Encounter for screening for human papillomavirus (HPV): Secondary | ICD-10-CM | POA: Insufficient documentation

## 2012-10-18 DIAGNOSIS — F3181 Bipolar II disorder: Secondary | ICD-10-CM

## 2012-10-18 DIAGNOSIS — Z01419 Encounter for gynecological examination (general) (routine) without abnormal findings: Secondary | ICD-10-CM | POA: Insufficient documentation

## 2012-10-18 DIAGNOSIS — Z124 Encounter for screening for malignant neoplasm of cervix: Secondary | ICD-10-CM

## 2012-10-18 DIAGNOSIS — F3189 Other bipolar disorder: Secondary | ICD-10-CM

## 2012-10-18 NOTE — Patient Instructions (Addendum)
Will check labs today. Get your mammogram done yearly. [facility]. Think about colonoscopy screening for colon cancer. I will call if pap abnormal, or send letter if ok. Try tylenol or aleve (or motrin) for pain until pain clinic appointment. Go to [facility] when you can for your depression/bipolar.

## 2012-10-18 NOTE — Progress Notes (Signed)
Subjective:     Bridget [name] is a 52 y.o. female and is here for a comprehensive physical exam. The patient reports problems -  1. chronic pain in joints-has referral to [facility] next week. Has UDS + THC. Using OTC meds until she establishes care. 2. untreated bipolar depression-she plans to establish at [facility] for therapy/psyciatry. not taking any depression meds currently. 3. Smoking. 0.5 ppd, considering starting wellbutrin again (didnt cause mood effects previously, and resolved her craving).  LMP 09/28/12  History   Social History  . Marital Status: Single    Spouse Name: N/A    Number of Children: N/A  . Years of Education: N/A   Occupational History  . Not on file.   Social History Main Topics  . Smoking status: Current Some Day Smoker -- 0.5 packs/day for 20 years    Types: Cigarettes  . Smokeless tobacco: Not on file  . Alcohol Use: No  . Drug Use: No  . Sexually Active: No   Other Topics Concern  . Not on file   Social History Narrative  . No narrative on file   Health Maintenance  Topic Date Due  . Pap Smear  02/21/2012  . Mammogram  08/07/2012  . Colonoscopy  08/07/2012  . Influenza Vaccine  06/03/2013  . Tetanus/tdap  09/19/2019    The following portions of the patient's history were reviewed and updated as appropriate: allergies, current medications, past family history, past medical history, past social history, past surgical history and problem list.  Review of Systems A comprehensive review of systems was negative except for: Gastrointestinal: positive for diarrhea Musculoskeletal: positive for arthralgias and back pain Behavioral/Psych: positive for anxiety and depression   Objective:    BP 116/86  Pulse 88  Temp 99.8 F (37.7 C) (Oral)  Ht 5\' 1"  (1.549 m)  Wt 204 lb 1.6 oz (92.579 kg)  BMI 38.56 kg/m2 General appearance: alert, cooperative and no distress Head: Normocephalic, without obvious abnormality, atraumatic Eyes:  conjunctivae/corneas clear. PERRL, EOM's intact. Fundi benign. Ears: normal TM's and external ear canals both ears Nose: Nares normal. Septum midline. Mucosa normal. No drainage or sinus tenderness. Throat: lips, mucosa, and tongue normal; teeth and gums normal Neck: no adenopathy, supple, symmetrical, trachea midline and thyroid not enlarged, symmetric, no tenderness/mass/nodules Lungs: clear to auscultation bilaterally Breasts: normal appearance, no masses or tenderness Heart: regular rate and rhythm, S1, S2 normal, no murmur, click, rub or gallop Abdomen: soft, non-tender; bowel sounds normal; no masses,  no organomegaly Pelvic: cervix normal in appearance, external genitalia normal, no adnexal masses or tenderness, no cervical motion tenderness, rectovaginal septum normal and vagina normal without discharge Extremities: extremities normal, atraumatic, no cyanosis or edema Skin: Skin color, texture, turgor normal. No rashes or lesions Neurologic: Alert and oriented X 3, normal strength and tone. Normal symmetric reflexes. Normal coordination and gait Psych. normal speech, dress. no active SI/HI. flat affect.     Assessment:    Healthy female exam. Untreated Bipolar disorder, stable. Chronic pain syndrome. Tobacco abuse Body mass index is 38.56 kg/(m^2).      Plan:     See After Visit Summary for Counseling Recommendations  1. Bipolar disorder. Clinically stable today. Advised she establish with [facility] again. She has been resistant to medications.  2. Tobacco abuse. Has rx for wellbutrin which helped in the past. She is waiting for the right time to pick quit date.  3. Chronic pain. No more narcotics from [facility]. Referred to [place]  pain clinic.  4. HM. Given info for mammogram, colonoscopy screening. Pap with HPV today, if negative can recheck in 5 yrs. Had flu shot previously. Screening labs today.

## 2012-10-18 NOTE — Telephone Encounter (Signed)
Spoke with patient and gave her the information below.

## 2012-10-23 ENCOUNTER — Encounter: Payer: Self-pay | Admitting: Family Medicine

## 2012-10-26 ENCOUNTER — Ambulatory Visit: Payer: Self-pay | Admitting: Pain Medicine

## 2012-10-30 ENCOUNTER — Telehealth: Payer: Self-pay | Admitting: Family Medicine

## 2012-10-30 NOTE — Telephone Encounter (Signed)
She had THC on UDS.  I cannot give narcotics and informed her of this last visit. She has already been seen at [facility] clinic and they recommended referral instead to the [facility] and [facility] neurosurgery which I believe is pending. Please inform her options are tramadol, motrin or tylenol, or try neurontin for nerve pain.

## 2012-10-30 NOTE — Telephone Encounter (Signed)
Pt is asking for pain meds until she can get into a pain mgmt clinic - pls advise

## 2012-10-31 ENCOUNTER — Telehealth: Payer: Self-pay | Admitting: Family Medicine

## 2012-10-31 NOTE — Telephone Encounter (Signed)
Called adn spoke to pt who called the after hours line Pt complaining of persistent hip pain that is her typical chronic pain. Asking what she can do for her pain. Informed that unable to prescribe any new pain medications and recommended her currently prescribed voltaren, robaxin and OTC tylenol. Pt stated that would be fine and would call to make an appt at [facility] in the am.   Shelly Flatten, MD Family Medicine PGY-2 10/31/2012, 11:04 PM

## 2012-11-01 ENCOUNTER — Ambulatory Visit (INDEPENDENT_AMBULATORY_CARE_PROVIDER_SITE_OTHER): Payer: Medicaid Other | Admitting: Family Medicine

## 2012-11-01 VITALS — BP 123/78 | HR 91 | Temp 99.4°F | Ht 61.0 in | Wt 202.0 lb

## 2012-11-01 DIAGNOSIS — F3181 Bipolar II disorder: Secondary | ICD-10-CM

## 2012-11-01 DIAGNOSIS — F17200 Nicotine dependence, unspecified, uncomplicated: Secondary | ICD-10-CM

## 2012-11-01 DIAGNOSIS — M549 Dorsalgia, unspecified: Secondary | ICD-10-CM

## 2012-11-01 DIAGNOSIS — F3189 Other bipolar disorder: Secondary | ICD-10-CM

## 2012-11-01 LAB — CBC
HCT: 37.3 % (ref 36.0–46.0)
MCHC: 32.4 g/dL (ref 30.0–36.0)
MCV: 86.9 fL (ref 78.0–100.0)
RDW: 15 % (ref 11.5–15.5)

## 2012-11-01 MED ORDER — GABAPENTIN 300 MG PO CAPS: 300.0000 mg | ORAL_CAPSULE | Freq: Every day | ORAL | Status: DC

## 2012-11-01 NOTE — Patient Instructions (Addendum)
Nice to see you. WIll try to get you referred to [facility] neurosurgery. Get into the therapist at [facility] or the [facility] with Dr. Pascal Lux. You can start neurontin for nerve pain, one tab at night, can increase to 1 tab twice daily if no side effects. Make appointment for check up 1 month. Call for medication problems.

## 2012-11-01 NOTE — Telephone Encounter (Signed)
Spoke with patient and she knows that the UDS was "dirty" because it was just "popped" on her. She has an appointment with Dr. Cristal Ford today and will discuss this further with her

## 2012-11-02 ENCOUNTER — Encounter: Payer: Self-pay | Admitting: Family Medicine

## 2012-11-02 LAB — COMPREHENSIVE METABOLIC PANEL
ALT: 14 U/L (ref 0–35)
AST: 12 U/L (ref 0–37)
Calcium: 9.5 mg/dL (ref 8.4–10.5)
Chloride: 107 mEq/L (ref 96–112)
Creat: 0.74 mg/dL (ref 0.50–1.10)
Sodium: 141 mEq/L (ref 135–145)
Total Protein: 6.7 g/dL (ref 6.0–8.3)

## 2012-11-02 LAB — LDL CHOLESTEROL, DIRECT: Direct LDL: 176 mg/dL — ABNORMAL HIGH

## 2012-11-02 NOTE — Assessment & Plan Note (Signed)
Will try to facilitate referral for second opinion to [facility] Neurosurgery (was already seen at [facility] and not offered additional treatment). Unsure if [facility] in [place] is an option for her currently. We discussed that I will not prescribing narcotics, but we can try low dose neurontin (she is unsure if tried before-I do not see on historical med list). She agrees to DC and notify MD if any mood effects or intolerability. Continue voltaren, robaxin. F/u in one month.

## 2012-11-02 NOTE — Assessment & Plan Note (Addendum)
Recommend she establish with [facility] for treatment-has been resistant and not responded well to various to meds prescribed at [facility]. She would benefit from therapy and mood stabilizer. Will monitor response to neurontin (primarily for pain).  She agrees to present for a visit soon.

## 2012-11-02 NOTE — Assessment & Plan Note (Signed)
Increase wellbutrin to BID. F/u in one month

## 2012-11-02 NOTE — Progress Notes (Signed)
  Subjective:    Patient ID: Bridget [name], female    DOB: May 09, 1961, 52 y.o.   MRN: [id number]  HPI  1. F/u chronic back pain. Patient was seen at [facility] pain clinic for consultation. MD recommend she return to [facility] clinic and get second opinion with [facility] neurosurgery. Patient states she called [facility] to try and get back in, but they referred her to the an office in [place], and she wonders if she can be established there. She is using robaxin, voltaren for pain currently. States that tylenol makes her legs feel funny and she doesn't want to take it. Wonders why we cant rx narcotics from here anymore.  No falls, weakness, incontinence.  2. Depression. Hasn't established at [facility] yet. She did start wellbutrin at once daily for smoking cessation but not helping yet. States her mood is stable currently, but does have crying episodes and struggles to get out of the house to do things. No SI/HI endorsed.  Review of Systems See HPI otherwise negative.  reports that she has been smoking Cigarettes.  She has a 10 pack-year smoking history. She does not have any smokeless tobacco history on file.     Objective:   Physical Exam  Vitals reviewed. Constitutional: She is oriented to person, place, and time. She appears well-developed and well-nourished. No distress.  HENT:  Head: Normocephalic and atraumatic.  Eyes: EOM are normal. Pupils are equal, round, and reactive to light.  Neurological: She is alert and oriented to person, place, and time.  Skin: She is not diaphoretic.  Psychiatric:       Flat affect.       Assessment & Plan:

## 2012-11-07 ENCOUNTER — Telehealth: Payer: Self-pay | Admitting: Family Medicine

## 2012-11-07 NOTE — Telephone Encounter (Signed)
Pt picked up her neurontin today and it says to take at bedtime - she wants something that will help her during the day.

## 2012-11-08 NOTE — Telephone Encounter (Signed)
Dr Newman Nip calls  Stating she doesn't like neurontin because its not instant gratification,would like what pain clinic gave her the hydrocodone 10/325.please advise. Bridget [name], Bridget [name]

## 2012-11-09 NOTE — Telephone Encounter (Signed)
Related message,pt requested to know the name of the pain clinic,after talking to Bridget [name] pt was told [facility] in [place].she voiced understanding. Bridget [name], Bridget [name]

## 2012-11-09 NOTE — Telephone Encounter (Signed)
I have discussed with her multiple times that we are not prescribing more narcotics from this clinic due to positive THC on UDS. Her pain clinic referral is ordered. She can try tramadol, tylenol or motrin. Please remind her of this.

## 2012-11-13 ENCOUNTER — Telehealth: Payer: Self-pay | Admitting: Family Medicine

## 2012-11-13 NOTE — Telephone Encounter (Signed)
Patient would like doctor to switch her back to the Wellbutrin SR 150 mg 12 hr tab. She feels that new medication is not working. Pt would also like to check on her referral to pain clinic.  Ancil Boozer, SMA

## 2012-11-14 ENCOUNTER — Other Ambulatory Visit: Payer: Self-pay | Admitting: Family Medicine

## 2012-11-14 MED ORDER — METHOCARBAMOL 750 MG PO TABS: 750.0000 mg | ORAL_TABLET | Freq: Three times a day (TID) | ORAL | Status: DC

## 2012-11-14 MED ORDER — DICLOFENAC SODIUM 50 MG PO TBEC: 50.0000 mg | DELAYED_RELEASE_TABLET | Freq: Three times a day (TID) | ORAL | Status: DC | PRN

## 2012-11-14 MED ORDER — BUPROPION HCL ER (SR) 150 MG PO TB12: 150.0000 mg | ORAL_TABLET | Freq: Two times a day (BID) | ORAL | Status: DC

## 2012-11-14 NOTE — Telephone Encounter (Signed)
SPoke with patient. Will change wellbutrin back to SR BID. Taking neurontin qhs without side effects, advised she may try to increase the dose to BID. She requests refill for robaxin, NSAID also.  [facility] neurosurgery has faxed a referral to their physical medicine and rehab department, stating she is not a surgical case. Also, [facility] in [place] is not an option because she was dismissed for non-compliance. She asks about other pain clinics, I advised her to find one that accepts medicaid, and we can try to send referral. She will return call if she has success.

## 2012-11-19 NOTE — Telephone Encounter (Signed)
Patient is calling because she has located 2 Pain Clinics that will accept MCD.  They are [facility] - [address] [phone] and [facility] (Dr. Ardell Isaacs) [phone].

## 2012-11-20 NOTE — Telephone Encounter (Signed)
Can we try to sent pain referal here since [facility] will not accept her?

## 2012-11-21 ENCOUNTER — Other Ambulatory Visit: Payer: Self-pay | Admitting: Family Medicine

## 2012-11-21 NOTE — Telephone Encounter (Signed)
Please put in new referral.  The original pain clinic referral is already linked to another appointment.   Bridget [name]

## 2012-11-21 NOTE — Telephone Encounter (Signed)
I have ordered new referral. Thanks.

## 2012-11-23 ENCOUNTER — Ambulatory Visit (INDEPENDENT_AMBULATORY_CARE_PROVIDER_SITE_OTHER): Payer: Medicaid Other | Admitting: Family Medicine

## 2012-11-23 ENCOUNTER — Encounter: Payer: Self-pay | Admitting: Family Medicine

## 2012-11-23 VITALS — BP 131/83 | HR 94 | Temp 98.7°F | Ht 61.0 in | Wt 203.1 lb

## 2012-11-23 DIAGNOSIS — T2220XA Burn of second degree of shoulder and upper limb, except wrist and hand, unspecified site, initial encounter: Secondary | ICD-10-CM

## 2012-11-23 NOTE — Assessment & Plan Note (Signed)
Explained again to patient and reviewed our narcotics policy with her. Again I will not prescribe additional prescriptions, and at this point, I do not see any any additional medications that can safely be given to her with untreated bipolar disorder and multiple intolerances. Already on muscle relaxant and NSAIDs. Will proceed with referral to private pain clinic.
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
## 2012-11-28 ENCOUNTER — Telehealth: Payer: Self-pay | Admitting: Family Medicine

## 2012-11-28 NOTE — Telephone Encounter (Signed)
Patient is calling inquiring about referral to Pain Clinic. Pls contact patient.

## 2012-11-28 NOTE — Telephone Encounter (Signed)
Spoke with patient she gave numerous othe pain clinics to try. Only one more will be tried due to this being her 5th referral.

## 2012-12-07 ENCOUNTER — Ambulatory Visit: Payer: Medicaid Other | Admitting: Family Medicine

## 2012-12-13 ENCOUNTER — Encounter: Payer: Self-pay | Admitting: Family Medicine

## 2012-12-13 ENCOUNTER — Ambulatory Visit (INDEPENDENT_AMBULATORY_CARE_PROVIDER_SITE_OTHER): Payer: Medicaid Other | Admitting: Family Medicine

## 2012-12-13 VITALS — BP 100/60 | HR 99 | Temp 98.9°F | Ht 61.0 in | Wt 204.0 lb

## 2012-12-13 DIAGNOSIS — F259 Schizoaffective disorder, unspecified: Secondary | ICD-10-CM

## 2012-12-13 DIAGNOSIS — M549 Dorsalgia, unspecified: Secondary | ICD-10-CM

## 2012-12-13 DIAGNOSIS — F3181 Bipolar II disorder: Secondary | ICD-10-CM

## 2012-12-13 DIAGNOSIS — E785 Hyperlipidemia, unspecified: Secondary | ICD-10-CM | POA: Insufficient documentation

## 2012-12-13 MED ORDER — PREGABALIN 50 MG PO CAPS: 50.0000 mg | ORAL_CAPSULE | Freq: Three times a day (TID) | ORAL | Status: DC

## 2012-12-13 NOTE — Assessment & Plan Note (Signed)
Remains uncontrolled with flat affect and depression, suspect that schizo-affective or schizophrenia is more at play. She has not started new med invega yet, but I encouraged this. She has f/u with [facility] in 2 weeks planned.

## 2012-12-13 NOTE — Assessment & Plan Note (Signed)
Discussed principles of diet and exercise, weight loss. Given printed information. F/u in 2-3 months and consider statin if willing or fails lifestyle modifications.
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
## 2012-12-13 NOTE — Assessment & Plan Note (Signed)
Uncontrolled. This clinic will not be prescribing narcotics due to breaking medication contract. Discussed with preceptors since patient has been verbally abusive in the past. Will check on if pain referral is possible or pending. Can try lyrica since she did not tolerate neurontin. F/u in 2-3 weeks.

## 2012-12-13 NOTE — Patient Instructions (Addendum)
Try diet and exercise for weight loss with your high cholesterol. Avoid fatty foods, high carbohydrates. Stick with vegetables, higher fiber, lean meat (chicken, non-fried fish). You can try lyrica for pain. Notify doctor of any side effects. Make appointment for check up in two months.  Fat and Cholesterol Control Diet Cholesterol levels in your body are determined significantly by your diet. Cholesterol levels may also be related to heart disease. The following material helps to explain this relationship and discusses what you can do to help keep your heart healthy. Not all cholesterol is bad. Low-density lipoprotein (LDL) cholesterol is the "bad" cholesterol. It may cause fatty deposits to build up inside your arteries. High-density lipoprotein (HDL) cholesterol is "good." It helps to remove the "bad" LDL cholesterol from your blood. Cholesterol is a very important risk factor for heart disease. Other risk factors are high blood pressure, smoking, stress, heredity, and weight. The heart muscle gets its supply of blood through the coronary arteries. If your LDL cholesterol is high and your HDL cholesterol is low, you are at risk for having fatty deposits build up in your coronary arteries. This leaves less room through which blood can flow. Without sufficient blood and oxygen, the heart muscle cannot function properly and you may feel chest pains (angina pectoris). When a coronary artery closes up entirely, a part of the heart muscle may die causing a heart attack (myocardial infarction). CHECKING CHOLESTEROL When your caregiver sends your blood to a lab to be examined for cholesterol, a complete lipid (fat) profile may be done. With this test, the total amount of cholesterol and levels of LDL and HDL are determined. Triglycerides are a type of fat that circulates in the blood. They can also be used to determine heart disease risk. The list below describes what the numbers should be: Test: Total  Cholesterol.  Less than 200 mg/dl. Test: LDL "bad cholesterol."  Less than 100 mg/dl.  Less than 70 mg/dl if you are at very high risk of a heart attack or sudden cardiac death. Test: HDL "good cholesterol."  Greater than 50 mg/dl for women.  Greater than 40 mg/dl for men. Test: Triglycerides.  Less than 150 mg/dl. CONTROLLING CHOLESTEROL WITH DIET Although exercise and lifestyle factors are important, your diet is key. That is because certain foods are known to raise cholesterol and others to lower it. The goal is to balance foods for their effect on cholesterol and more importantly, to replace saturated and trans fat with other types of fat, such as monounsaturated fat, polyunsaturated fat, and omega-3 fatty acids. On average, a person should consume no more than 15 to 17 g of saturated fat daily. Saturated and trans fats are considered "bad" fats, and they will raise LDL cholesterol. Saturated fats are primarily found in animal products such as meats, butter, and cream. However, that does not mean you need to give up all your favorite foods. Today, there are good tasting, low-fat, low-cholesterol substitutes for most of the things you like to eat. Choose low-fat or nonfat alternatives. Choose round or loin cuts of red meat. These types of cuts are lowest in fat and cholesterol. Chicken (without the skin), fish, veal, and ground [place] breast are great choices. Eliminate fatty meats, such as hot dogs and salami. Even shellfish have little or no saturated fat. Have a 3 oz (85 g) portion when you eat lean meat, poultry, or fish. Trans fats are also called "partially hydrogenated oils." They are oils that have been scientifically manipulated  so that they are solid at room temperature resulting in a longer shelf life and improved taste and texture of foods in which they are added. Trans fats are found in stick margarine, some tub margarines, cookies, crackers, and baked goods.  When baking and  cooking, oils are a great substitute for butter. The monounsaturated oils are especially beneficial since it is believed they lower LDL and raise HDL. The oils you should avoid entirely are saturated tropical oils, such as coconut and palm.  Remember to eat a lot from food groups that are naturally free of saturated and trans fat, including fish, fruit, vegetables, beans, grains (barley, rice, couscous, bulgur wheat), and pasta (without cream sauces).  IDENTIFYING FOODS THAT LOWER CHOLESTEROL  Soluble fiber may lower your cholesterol. This type of fiber is found in fruits such as apples, vegetables such as broccoli, potatoes, and carrots, legumes such as beans, peas, and lentils, and grains such as barley. Foods fortified with plant sterols (phytosterol) may also lower cholesterol. You should eat at least 2 g per day of these foods for a cholesterol lowering effect.  Read package labels to identify low-saturated fats, trans fat free, and low-fat foods at the supermarket. Select cheeses that have only 2 to 3 g saturated fat per ounce. Use a heart-healthy tub margarine that is free of trans fats or partially hydrogenated oil. When buying baked goods (cookies, crackers), avoid partially hydrogenated oils. Breads and muffins should be made from whole grains (whole-wheat or whole oat flour, instead of "flour" or "enriched flour"). Buy non-creamy canned soups with reduced salt and no added fats.  FOOD PREPARATION TECHNIQUES  Never deep-fry. If you must fry, either stir-fry, which uses very little fat, or use non-stick cooking sprays. When possible, broil, bake, or roast meats, and steam vegetables. Instead of putting butter or margarine on vegetables, use lemon and herbs, applesauce, and cinnamon (for squash and sweet potatoes), nonfat yogurt, salsa, and low-fat dressings for salads.  LOW-SATURATED FAT / LOW-FAT FOOD SUBSTITUTES Meats / Saturated Fat (g)  Avoid: Steak, marbled (3 oz/85 g) / 11 g  Choose:  Steak, lean (3 oz/85 g) / 4 g  Avoid: Hamburger (3 oz/85 g) / 7 g  Choose: Hamburger, lean (3 oz/85 g) / 5 g  Avoid: Ham (3 oz/85 g) / 6 g  Choose: Ham, lean cut (3 oz/85 g) / 2.4 g  Avoid: Chicken, with skin, dark meat (3 oz/85 g) / 4 g  Choose: Chicken, skin removed, dark meat (3 oz/85 g) / 2 g  Avoid: Chicken, with skin, light meat (3 oz/85 g) / 2.5 g  Choose: Chicken, skin removed, light meat (3 oz/85 g) / 1 g Dairy / Saturated Fat (g)  Avoid: Whole milk (1 cup) / 5 g  Choose: Low-fat milk, 2% (1 cup) / 3 g  Choose: Low-fat milk, 1% (1 cup) / 1.5 g  Choose: Skim milk (1 cup) / 0.3 g  Avoid: Hard cheese (1 oz/28 g) / 6 g  Choose: Skim milk cheese (1 oz/28 g) / 2 to 3 g  Avoid: Cottage cheese, 4% fat (1 cup) / 6.5 g  Choose: Low-fat cottage cheese, 1% fat (1 cup) / 1.5 g  Avoid: Ice cream (1 cup) / 9 g  Choose: Sherbet (1 cup) / 2.5 g  Choose: Nonfat frozen yogurt (1 cup) / 0.3 g  Choose: Frozen fruit bar / trace  Avoid: Whipped cream (1 tbs) / 3.5 g  Choose: Nondairy whipped topping (1 tbs) /  1 g Condiments / Saturated Fat (g)  Avoid: Mayonnaise (1 tbs) / 2 g  Choose: Low-fat mayonnaise (1 tbs) / 1 g  Avoid: Butter (1 tbs) / 7 g  Choose: Extra light margarine (1 tbs) / 1 g  Avoid: Coconut oil (1 tbs) / 11.8 g  Choose: Olive oil (1 tbs) / 1.8 g  Choose: Corn oil (1 tbs) / 1.7 g  Choose: Safflower oil (1 tbs) / 1.2 g  Choose: Sunflower oil (1 tbs) / 1.4 g  Choose: Soybean oil (1 tbs) / 2.4 g  Choose: Canola oil (1 tbs) / 1 g Document Released: 09/19/2005 Document Revised: 12/12/2011 Document Reviewed: 03/10/2011 [facility] Patient Information 2013 [place], [place].

## 2012-12-18 ENCOUNTER — Encounter: Payer: Self-pay | Admitting: Family Medicine

## 2012-12-18 ENCOUNTER — Ambulatory Visit (HOSPITAL_COMMUNITY)
Admission: RE | Admit: 2012-12-18 | Discharge: 2012-12-18 | Disposition: A | Discharge status: Home / Self Care | Payer: Medicaid Other | Source: Ambulatory Visit | Attending: Family Medicine | Admitting: Family Medicine

## 2012-12-18 ENCOUNTER — Ambulatory Visit (INDEPENDENT_AMBULATORY_CARE_PROVIDER_SITE_OTHER): Payer: Medicaid Other | Admitting: Family Medicine

## 2012-12-18 ENCOUNTER — Telehealth: Payer: Self-pay | Admitting: Family Medicine

## 2012-12-18 VITALS — BP 129/96 | HR 89 | Temp 99.3°F | Ht 61.0 in | Wt 203.0 lb

## 2012-12-18 DIAGNOSIS — R079 Chest pain, unspecified: Secondary | ICD-10-CM

## 2012-12-18 DIAGNOSIS — R002 Palpitations: Secondary | ICD-10-CM | POA: Insufficient documentation

## 2012-12-18 DIAGNOSIS — R9431 Abnormal electrocardiogram [ECG] [EKG]: Secondary | ICD-10-CM | POA: Insufficient documentation

## 2012-12-18 DIAGNOSIS — J3489 Other specified disorders of nose and nasal sinuses: Secondary | ICD-10-CM

## 2012-12-18 DIAGNOSIS — R0981 Nasal congestion: Secondary | ICD-10-CM

## 2012-12-18 MED ORDER — FLUTICASONE PROPIONATE 50 MCG/ACT NA SUSP: 2.0000 | Freq: Every day | NASAL | Status: DC

## 2012-12-18 NOTE — Patient Instructions (Addendum)
Try to call the [facility] again. You can check about getting back into [facility] if interested. I didn't know about the paperwork process. Make an appointment in one month for check up. Make sure to follow up with [facility]. If you have chest pain or shortness of breath then go to the ER.

## 2012-12-18 NOTE — Assessment & Plan Note (Addendum)
Today seems more subdued and depressed. She has shown some initiative in getting treatment, finally. Encouraged her to continue with invega and f/u with [facility]. Verbal contract for safety.  Her EKG shows mildly elevated QTC, but likely the benefit of antipsychotic at current dose outweighs risk.

## 2012-12-18 NOTE — Assessment & Plan Note (Signed)
Unsure if this is related to new medication. DC lyrica. Use flonase prn for nasal edema.

## 2012-12-18 NOTE — Telephone Encounter (Signed)
Started taking Lyrica last night and it was taking her breath away.  Wants to know what to do.

## 2012-12-18 NOTE — Assessment & Plan Note (Addendum)
Uncontrolled. Patient would benefit from pain clinic, referral has been unsuccessful so far. The [facility] PMR office was too far away. She is desperate for some kind of treatment.  She failed lyrica and neurontin due to intolerance. Will not prescribe narcotics from this office.

## 2012-12-18 NOTE — Assessment & Plan Note (Signed)
Chronic issue, most likely related to anxiety. Worsened with her pain recently. EKG shows normal sinus rhythm with QTC 493. Electrolytes and TSH wnl recently. Will hold off on further work up at this time unless symptoms worsen.

## 2012-12-18 NOTE — Telephone Encounter (Signed)
Patient states she started Lyrica last week and the first day she noticed some shortness of breath  but continued taking until last night, she did not take it. Feels that her breathing is not as bad as when she was on the medication. However over the phone she does sound a little short of breath. Advised patient that she should come in today to be checked that SOB may be unrelated to Lyrica. She cannot come today , has to go to work. Appointment scheduled tomorrow AM. Dr. Cristal Ford notified. Patient understands to call back  if she decides she can come today or go to UC or ED if after hours.

## 2012-12-18 NOTE — Progress Notes (Signed)
  Subjective:    Patient ID: Bridget [name], female    DOB: 06-28-1961, 52 y.o.   MRN: [id number]  Shortness of Breath    1. Dyspnea. Patient states she noticed a feeling of dyspnea and trouble breathing through her nose after starting lyrica. Symptoms started few minutes after taking the pill. She took about 3 or 4 doses. Discontinued last night. States she still has mild problem with breathing through her nose.  Patient complicates this presentation by stating that she sometimes feels short of breath when her chronic pain is very severe, like she cannot control her body. Also notices palpitations more frequently since being off her narcotics.  No chest pain, wheezing, fever, chills, nausea, current palpitations, edema, rash, lip swelling.   2. Chronic pain syndrome. Patient very frustrated regarding not being accepted to another pain clinic. She is willing to call back [facility] PMR office to try injections. She is taking voltaren, robaxin. Has multiple intolerances.  3. Mood disorder/schizoaffective. She seems less agitated today, and more tearful. She did start invega 2 nights ago. She says thinks about death, but is not planning anything. She is remorseful for being angry and yelling at her family, and apologizes for yelling at me last visit. No active SI/HI. She verbalizes safety plan if thoughts of self harm become more prominent.   Review of Systems  Respiratory: Positive for shortness of breath.    See HPI otherwise negative.  reports that she has been smoking Cigarettes.  She has a 10 pack-year smoking history. She does not have any smokeless tobacco history on file.     Objective:   Physical Exam  Vitals reviewed. Constitutional: She is oriented to person, place, and time. She appears well-developed and well-nourished. No distress.  Tearful at times. Affect is flat.  HENT:  Head: Normocephalic and atraumatic.  Slight pharyngeal erythema. No exudate or hypertrophy. Nasal edema  bilaterally, slight congestion and slight hoarseness. No LAD. No stridor.  Eyes: EOM are normal. Pupils are equal, round, and reactive to light.  Neck: Normal range of motion. Neck supple.  Cardiovascular: Normal rate, regular rhythm and normal heart sounds.   No murmur heard. Pulmonary/Chest: Effort normal and breath sounds normal. No stridor. No respiratory distress. She has no wheezes. She has no rales. She exhibits no tenderness.  Musculoskeletal: She exhibits no edema and no tenderness.  Lymphadenopathy:    She has no cervical adenopathy.  Neurological: She is alert and oriented to person, place, and time.  Skin: No rash noted. She is not diaphoretic.  Psychiatric:  Tearful, remorseful today. Normal speech and dress.   EKG: normal EKG, normal sinus rhythm, prolonged QT interval, 493.      Assessment & Plan:

## 2012-12-19 ENCOUNTER — Telehealth: Payer: Self-pay | Admitting: Family Medicine

## 2012-12-19 ENCOUNTER — Ambulatory Visit: Payer: Medicaid Other | Admitting: Family Medicine

## 2012-12-19 NOTE — Telephone Encounter (Signed)
Emergency Line:  Calls to report this is the 3rd night she has not been able to sleep due to pain. Went to doctor yesterday for pain and shortness of breath. States she has tried Lyrica, ice, heat, Robaxin, Diclofenac and is awaiting pain clinic appointment. She states she has had 3 surgeries, and has another surgery coming up. Current pain is nerve pain in her back, buttocks, legs and feet. Most pain is left hip.   Explained to patient that I cannot prescribe medication over this line, but she should be evaluated by a doctor in our clinic, the ED or her back doctor especially if the pain is new or worsening. No red flag symptoms at this time. Pt understands.  Bridget [name] M. Cheril Slattery, M.D. 12/19/2012 6:50 AM

## 2012-12-19 NOTE — Telephone Encounter (Signed)
Pt called to let Dr Cristal Ford know that she hasn't slept in 3 days because of this pain - she has an appt with Ortho tomorrow, and is trying to get in touch with neurologist to see about getting in there, she has already talked with [facility] also.  She is still having a very difficult time and just wanted to let her know that she really is trying.  She is going to call the pain clinic to see if they could possibly let her back in. She is asking to speak to Dr Cristal Ford

## 2012-12-19 NOTE — Telephone Encounter (Signed)
We discussed all this in clinic yesterday. I have nothing new to add, she has not tolerated the other pain meds we have tried, and narcotics are not an option currently. Patient with uncontrolled pain and mood disorder, would advise her to ask if her pain clinic would take her back. She has one more pain referral attempted, and can always call back [facility] PMR clinic for evaluation.

## 2013-01-17 ENCOUNTER — Telehealth: Payer: Self-pay | Admitting: *Deleted

## 2013-01-17 NOTE — Telephone Encounter (Signed)
Patient was referred to [facility] from [facility] for pain management.  Office calling to get NPI # since we are PCP on Medicaid card.  Will authorize one visit.  Will need office notes from [facility] to be able to authorize additional visits.  [facility] will follow-up with Delbert Harness.  Gaylene Brooks, RN

## 2013-01-21 NOTE — Telephone Encounter (Signed)
Hope you could help me with this.thank you jo

## 2013-01-21 NOTE — Telephone Encounter (Signed)
Pt has a return appt on May 15 and wants to make sure that this will be authorized.  pls advise

## 2013-01-31 NOTE — Telephone Encounter (Signed)
Spoke with Shanda Bumps at [facility].  They do not currently need another NPI # for approval at this time.  They will call our office back as needed.  Gaylene Brooks, RN

## 2013-02-04 ENCOUNTER — Other Ambulatory Visit: Payer: Self-pay | Admitting: Family Medicine

## 2013-03-21 ENCOUNTER — Other Ambulatory Visit: Payer: Self-pay | Admitting: Family Medicine

## 2013-03-28 ENCOUNTER — Other Ambulatory Visit: Payer: Self-pay | Admitting: *Deleted

## 2013-03-28 ENCOUNTER — Other Ambulatory Visit: Payer: Self-pay | Admitting: Family Medicine

## 2013-03-28 MED ORDER — METHOCARBAMOL 750 MG PO TABS: ORAL_TABLET | ORAL | Status: DC

## 2013-06-06 ENCOUNTER — Other Ambulatory Visit: Payer: Self-pay | Admitting: Family Medicine

## 2013-07-31 ENCOUNTER — Emergency Department (HOSPITAL_COMMUNITY)
Admission: EM | Admit: 2013-07-31 | Discharge: 2013-07-31 | Disposition: A | Discharge status: Home / Self Care | Payer: Medicaid Other | Attending: Emergency Medicine | Admitting: Emergency Medicine

## 2013-07-31 ENCOUNTER — Telehealth: Payer: Self-pay | Admitting: Emergency Medicine

## 2013-07-31 ENCOUNTER — Encounter (HOSPITAL_COMMUNITY): Payer: Self-pay | Admitting: Emergency Medicine

## 2013-07-31 ENCOUNTER — Emergency Department (HOSPITAL_COMMUNITY): Payer: Medicaid Other

## 2013-07-31 DIAGNOSIS — K219 Gastro-esophageal reflux disease without esophagitis: Secondary | ICD-10-CM | POA: Insufficient documentation

## 2013-07-31 DIAGNOSIS — R110 Nausea: Secondary | ICD-10-CM | POA: Insufficient documentation

## 2013-07-31 DIAGNOSIS — Z79899 Other long term (current) drug therapy: Secondary | ICD-10-CM | POA: Insufficient documentation

## 2013-07-31 DIAGNOSIS — R0789 Other chest pain: Secondary | ICD-10-CM | POA: Insufficient documentation

## 2013-07-31 DIAGNOSIS — F17200 Nicotine dependence, unspecified, uncomplicated: Secondary | ICD-10-CM | POA: Insufficient documentation

## 2013-07-31 DIAGNOSIS — M1990 Unspecified osteoarthritis, unspecified site: Secondary | ICD-10-CM | POA: Insufficient documentation

## 2013-07-31 DIAGNOSIS — F3189 Other bipolar disorder: Secondary | ICD-10-CM | POA: Insufficient documentation

## 2013-07-31 DIAGNOSIS — R079 Chest pain, unspecified: Secondary | ICD-10-CM

## 2013-07-31 DIAGNOSIS — Z791 Long term (current) use of non-steroidal anti-inflammatories (NSAID): Secondary | ICD-10-CM | POA: Insufficient documentation

## 2013-07-31 LAB — CBC WITH DIFFERENTIAL/PLATELET
Basophils Absolute: 0.1 10*3/uL (ref 0.0–0.1)
Basophils Relative: 1 % (ref 0–1)
Eosinophils Absolute: 0.2 10*3/uL (ref 0.0–0.7)
Eosinophils Relative: 2 % (ref 0–5)
HCT: 36.8 % (ref 36.0–46.0)
Hemoglobin: 12.6 g/dL (ref 12.0–15.0)
MCH: 28.7 pg (ref 26.0–34.0)
MCHC: 34.2 g/dL (ref 30.0–36.0)
MCV: 83.8 fL (ref 78.0–100.0)
Monocytes Absolute: 0.7 10*3/uL (ref 0.1–1.0)
Monocytes Relative: 5 % (ref 3–12)
RDW: 14.3 % (ref 11.5–15.5)
WBC: 14.5 10*3/uL — ABNORMAL HIGH (ref 4.0–10.5)

## 2013-07-31 LAB — COMPREHENSIVE METABOLIC PANEL
ALT: 14 U/L (ref 0–35)
AST: 18 U/L (ref 0–37)
Albumin: 4.2 g/dL (ref 3.5–5.2)
Alkaline Phosphatase: 75 U/L (ref 39–117)
BUN: 6 mg/dL (ref 6–23)
CO2: 23 mEq/L (ref 19–32)
Calcium: 9.4 mg/dL (ref 8.4–10.5)
Creatinine, Ser: 0.68 mg/dL (ref 0.50–1.10)
Glucose, Bld: 120 mg/dL — ABNORMAL HIGH (ref 70–99)
Potassium: 3.5 mEq/L (ref 3.5–5.1)
Sodium: 136 mEq/L (ref 135–145)
Total Protein: 7.5 g/dL (ref 6.0–8.3)

## 2013-07-31 LAB — TROPONIN I: Troponin I: <0.3 ng/mL (ref <0.30)

## 2013-07-31 MED ORDER — GI COCKTAIL ~~LOC~~
30.0000 mL | Freq: Once | ORAL | Status: AC
  Administered 2013-07-31: 30 mL via ORAL
  Filled 2013-07-31: qty 30

## 2013-07-31 MED ORDER — ONDANSETRON 4 MG PO TBDP
8.0000 mg | ORAL_TABLET | Freq: Once | ORAL | Status: AC
  Administered 2013-07-31: 8 mg via ORAL
  Filled 2013-07-31: qty 2

## 2013-07-31 NOTE — Telephone Encounter (Signed)
Emergency Line Call Patient calling for "indigestion."  She states she has pain in her chest and throat.  It has been present for several days, but worse this evening.  She just took a nexium prior to calling.  Also reports feeling dizzy at rest.  I strongly recommended that she be seen by a physician tonight for further evaluation.  She should not drive but either call a friend or call EMS.  She expressed understanding and agreement.

## 2013-07-31 NOTE — ED Notes (Signed)
Presents with abdominal pain described as burning radiating up into esophagus and throat, pain is described as severe and associated with nausea and dizziness. Reports "I have indigestion really bad, I have had it bad before but never this bad. This is the second time it has been bad but it is even worse"

## 2013-07-31 NOTE — ED Provider Notes (Signed)
CSN: [id number]     Arrival date & time 07/31/13  0055 History   First MD Initiated Contact with Patient 07/31/13 0144     Chief Complaint  Patient presents with  . Abdominal Pain   HPI Patient reports a burning sensation in her chest without associated shortness of breath.  She states it worsened by eating hot dogs today.  She feels nausea without vomiting.  She denies a prior history of cardiac disease.  She denies diabetes, hypertension, hyperlipidemia.  No family history of early heart disease.  No fevers or chills.  Her pain is moderate to severe in severity at this time.  Her pain is improved if she walks around or stands up.   Past Medical History  Diagnosis Date  . Depression   . Bipolar II disorder   . Osteoarthritis   . Obesity, morbid    Past Surgical History  Procedure Laterality Date  . Rotator cuff repair      BILATERAL  . Bone spurs      REMOVED FROM SHOULDERS  . Eye surgery      STY REMOVED LEFT EYE  1992  . Total hip arthroplasty  08/15/2011    Procedure: TOTAL HIP ARTHROPLASTY;  Surgeon: Eulas Post;  Location: [facility] OR;  Service: Orthopedics;  Laterality: Right;   Family History  Problem Relation Age of Onset  . Bipolar disorder Sister   . Hypertension Sister   . Hypertension Brother   . Hypertension Other   . Hypertension Mother   . Rheum arthritis Mother   . Hypertension Maternal Aunt   . Cancer Father    History  Substance Use Topics  . Smoking status: Current Some Day Smoker -- 0.50 packs/day for 20 years    Types: Cigarettes  . Smokeless tobacco: Not on file  . Alcohol Use: No   OB History   Grav Para Term Preterm Abortions TAB SAB Ect Mult Living                 Review of Systems  All other systems reviewed and are negative.    Allergies  Lyrica; Neurontin; Oxycodone hcl; and Tramadol  Home Medications   Current Outpatient Rx  Name  Route  Sig  Dispense  Refill  . buPROPion (WELLBUTRIN SR) 150 MG 12 hr tablet   Oral   Take 1  tablet (150 mg total) by mouth 2 (two) times daily.   60 tablet   1   . buPROPion (WELLBUTRIN SR) 150 MG 12 hr tablet   Oral   Take 150 mg by mouth 2 (two) times daily.         . diclofenac (VOLTAREN) 50 MG EC tablet   Oral   Take 50 mg by mouth 3 (three) times daily.         Marland Kitchen HYDROcodone-acetaminophen (NORCO) 10-325 MG per tablet   Oral   Take 1 tablet by mouth every 4 (four) hours as needed for pain.         . methocarbamol (ROBAXIN) 750 MG tablet   Oral   Take 750 mg by mouth 3 (three) times daily.         . paliperidone (INVEGA) 3 MG 24 hr tablet   Oral   Take 3 mg by mouth every morning.          BP 142/88  Pulse 85  Temp(Src) 98.5 F (36.9 C) (Oral)  Resp 20  SpO2 98%  LMP 07/18/2013 Physical Exam  Nursing  note and vitals reviewed. Constitutional: She is oriented to person, place, and time. She appears well-developed and well-nourished. No distress.  HENT:  Head: Normocephalic and atraumatic.  Eyes: EOM are normal.  Neck: Normal range of motion.  Cardiovascular: Normal rate, regular rhythm and normal heart sounds.   Pulmonary/Chest: Effort normal and breath sounds normal.  Abdominal: Soft. She exhibits no distension. There is no tenderness.  Musculoskeletal: Normal range of motion.  Neurological: She is alert and oriented to person, place, and time.  Skin: Skin is warm and dry.  Psychiatric: She has a normal mood and affect. Judgment normal.    ED Course  Procedures (including critical care time) Labs Review Labs Reviewed  COMPREHENSIVE METABOLIC PANEL - Abnormal; Notable for the following:    Glucose, Bld 120 (*)    All other components within normal limits  CBC WITH DIFFERENTIAL - Abnormal; Notable for the following:    WBC 14.5 (*)    Neutro Abs 10.1 (*)    All other components within normal limits  TROPONIN I  POCT I-STAT TROPONIN I   Imaging Review Dg Chest 2 View  07/31/2013   CLINICAL DATA:  Abdominal pain  EXAM: CHEST  2 VIEW   COMPARISON:  08/09/2011  FINDINGS: The heart size and mediastinal contours are within normal limits. Both lungs are clear of edema or consolidation. No effusion or pneumothorax. The visualized skeletal structures are unremarkable.  IMPRESSION: No evidence of active cardiopulmonary disease.   Electronically Signed   By: Tiburcio Pea M.D.   On: 07/31/2013 02:42  I personally reviewed the imaging tests through PACS system I reviewed available ER/hospitalization records through the EMR   EKG Interpretation     Ventricular Rate:  95 PR Interval:  132 QRS Duration: 70 QT Interval:  384 QTC Calculation: 482 R Axis:   -5 Text Interpretation:  Normal sinus rhythm Nonspecific ST abnormality Prolonged QT Abnormal ECG No significant change was found            MDM   1. Chest pain   2. GERD (gastroesophageal reflux disease)    2:57 AM Patient feels much better after GI cocktail.  Doubt ACS.  Doubt PE.  Likely gastroesophageal reflux disease.  On Nexium at home.    Lyanne Co, MD 07/31/13 [phone]

## 2013-08-13 ENCOUNTER — Ambulatory Visit: Payer: Medicaid Other | Admitting: Family Medicine

## 2013-08-16 ENCOUNTER — Other Ambulatory Visit: Payer: Self-pay | Admitting: Family Medicine

## 2013-09-29 ENCOUNTER — Encounter (HOSPITAL_COMMUNITY): Payer: Self-pay | Admitting: Emergency Medicine

## 2013-09-29 ENCOUNTER — Emergency Department (INDEPENDENT_AMBULATORY_CARE_PROVIDER_SITE_OTHER): Payer: Medicaid Other

## 2013-09-29 ENCOUNTER — Emergency Department (INDEPENDENT_AMBULATORY_CARE_PROVIDER_SITE_OTHER)
Admission: EM | Admit: 2013-09-29 | Discharge: 2013-09-29 | Disposition: A | Discharge status: Home / Self Care | Payer: Medicaid Other | Source: Home / Self Care | Attending: Family Medicine | Admitting: Family Medicine

## 2013-09-29 DIAGNOSIS — J441 Chronic obstructive pulmonary disease with (acute) exacerbation: Secondary | ICD-10-CM

## 2013-09-29 MED ORDER — SODIUM CHLORIDE 0.9 % IJ SOLN
INTRAMUSCULAR | Status: AC
  Filled 2013-09-29: qty 3

## 2013-09-29 MED ORDER — IPRATROPIUM BROMIDE 0.02 % IN SOLN
RESPIRATORY_TRACT | Status: AC
  Filled 2013-09-29: qty 2.5

## 2013-09-29 MED ORDER — PREDNISONE 50 MG PO TABS: 50.0000 mg | ORAL_TABLET | Freq: Every day | ORAL | Status: DC

## 2013-09-29 MED ORDER — ALBUTEROL SULFATE (5 MG/ML) 0.5% IN NEBU
INHALATION_SOLUTION | RESPIRATORY_TRACT | Status: AC
  Filled 2013-09-29: qty 1

## 2013-09-29 MED ORDER — IPRATROPIUM BROMIDE 0.02 % IN SOLN
0.5000 mg | Freq: Once | RESPIRATORY_TRACT | Status: AC
  Administered 2013-09-29: 0.5 mg via RESPIRATORY_TRACT

## 2013-09-29 MED ORDER — AMOXICILLIN 500 MG PO CAPS: 1000.0000 mg | ORAL_CAPSULE | Freq: Two times a day (BID) | ORAL | Status: DC

## 2013-09-29 MED ORDER — ALBUTEROL SULFATE HFA 108 (90 BASE) MCG/ACT IN AERS: 2.0000 | INHALATION_SPRAY | RESPIRATORY_TRACT | Status: DC | PRN

## 2013-09-29 MED ORDER — ALBUTEROL SULFATE (5 MG/ML) 0.5% IN NEBU
5.0000 mg | INHALATION_SOLUTION | Freq: Once | RESPIRATORY_TRACT | Status: AC
  Administered 2013-09-29: 5 mg via RESPIRATORY_TRACT

## 2013-09-29 MED ORDER — IPRATROPIUM BROMIDE 0.03 % NA SOLN: 2.0000 | Freq: Two times a day (BID) | NASAL | Status: DC

## 2013-09-29 MED ORDER — AZITHROMYCIN 250 MG PO TABS: 250.0000 mg | ORAL_TABLET | Freq: Every day | ORAL | Status: DC

## 2013-09-29 NOTE — ED Provider Notes (Signed)
Bridget [name] is a 52 y.o. female who presents to Urgent Care today for cough congestion hoarse voice and wheezing. This is been present 2 weeks. Patient notes the cough is productive of clear to green sputum. She additionally notes some nasal discharge and congestion. She denies any fevers or chills nausea vomiting or diarrhea. She does note some shortness of breath. She feels well otherwise.   Past Medical History  Diagnosis Date  . Depression   . Bipolar II disorder   . Osteoarthritis   . Obesity, morbid    History  Substance Use Topics  . Smoking status: Current Some Day Smoker -- 0.50 packs/day for 20 years    Types: Cigarettes  . Smokeless tobacco: Not on file  . Alcohol Use: No   ROS as above Medications reviewed. No current facility-administered medications for this encounter.   Current Outpatient Prescriptions  Medication Sig Dispense Refill  . albuterol (PROVENTIL HFA;VENTOLIN HFA) 108 (90 BASE) MCG/ACT inhaler Inhale 2 puffs into the lungs every 4 (four) hours as needed for wheezing or shortness of breath.  1 Inhaler  1  . amoxicillin (AMOXIL) 500 MG capsule Take 2 capsules (1,000 mg total) by mouth 2 (two) times daily.  28 capsule  0  . buPROPion (WELLBUTRIN SR) 150 MG 12 hr tablet Take 1 tablet (150 mg total) by mouth 2 (two) times daily.  60 tablet  1  . buPROPion (WELLBUTRIN SR) 150 MG 12 hr tablet Take 150 mg by mouth 2 (two) times daily.      . diclofenac (VOLTAREN) 50 MG EC tablet Take 50 mg by mouth 3 (three) times daily.      Marland Kitchen HYDROcodone-acetaminophen (NORCO) 10-325 MG per tablet Take 1 tablet by mouth every 4 (four) hours as needed for pain.      Marland Kitchen ipratropium (ATROVENT) 0.03 % nasal spray Place 2 sprays into the nose every 12 (twelve) hours.  30 mL  1  . methocarbamol (ROBAXIN) 750 MG tablet Take 750 mg by mouth 3 (three) times daily.      . methocarbamol (ROBAXIN) 750 MG tablet TAKE 1 TABLET (750 MG TOTAL) BY MOUTH 3 (THREE) TIMES DAILY.  90 tablet  1  .  paliperidone (INVEGA) 3 MG 24 hr tablet Take 3 mg by mouth every morning.      . predniSONE (DELTASONE) 50 MG tablet Take 1 tablet (50 mg total) by mouth daily.  5 tablet  0    Exam:  BP 131/90  Pulse 105  Temp(Src) 99.3 F (37.4 C) (Oral)  Resp 18  SpO2 96%  LMP 09/14/2013 Gen: Well NAD HEENT: EOMI,  MMM Lungs: Normal work of breathing. Rhonchorous breath sounds and wheezing present bilaterally. Heart: RRR no MRG Abd: NABS, Soft. NT, ND Exts: Non edematous BL  LE, warm and well perfused.   Patient was given a DuoNeb nebulizer treatment and had improvement in symptoms.  No results found for this or any previous visit (from the past 24 hour(s)). Dg Chest 2 View  09/29/2013   CLINICAL DATA:  Cough, congestion, runny nose, shortness of breath  EXAM: CHEST  2 VIEW  COMPARISON:  07/31/2013  FINDINGS: Upper-normal size of cardiac silhouette.  Mediastinal contours and pulmonary vascularity normal.  Peribronchial thickening with linear subsegmental atelectasis or scarring at right middle lobe.  Remaining lungs clear.  No pleural effusion or pneumothorax.  Scattered endplate spur formation thoracic spine.  IMPRESSION: Mild bronchitic changes with minimal atelectasis or scarring at right middle lobe.  Electronically Signed   By: Ulyses Southward M.D.   On: 09/29/2013 13:40    Assessment and Plan: 52 y.o. female with COPD exacerbation. Plan to treat with prednisone, amoxicillin, Atrovent nasal spray, albuterol. Discussed warning signs or symptoms. Please see discharge instructions. Patient expresses understanding.      Rodolph Bong, MD 09/29/13 [phone]

## 2013-09-29 NOTE — ED Notes (Signed)
C/o chills, chest congestion, nonproductive cough, sore throat, and sneezing which started 12/13 and got worst this week otc medication taking but no relief

## 2014-04-07 ENCOUNTER — Encounter: Payer: Medicaid Other | Admitting: Family Medicine

## 2014-05-15 ENCOUNTER — Ambulatory Visit (INDEPENDENT_AMBULATORY_CARE_PROVIDER_SITE_OTHER): Payer: Medicaid Other | Admitting: Family Medicine

## 2014-05-15 ENCOUNTER — Encounter: Payer: Self-pay | Admitting: Family Medicine

## 2014-05-15 VITALS — BP 123/78 | HR 95 | Temp 99.0°F | Ht 61.0 in | Wt 225.9 lb

## 2014-05-15 DIAGNOSIS — Z0000 Encounter for general adult medical examination without abnormal findings: Secondary | ICD-10-CM | POA: Diagnosis not present

## 2014-05-15 DIAGNOSIS — Z5320 Procedure and treatment not carried out because of patient's decision for unspecified reasons: Secondary | ICD-10-CM | POA: Diagnosis not present

## 2014-05-15 DIAGNOSIS — Z1239 Encounter for other screening for malignant neoplasm of breast: Secondary | ICD-10-CM

## 2014-05-15 DIAGNOSIS — IMO0001 Reserved for inherently not codable concepts without codable children: Secondary | ICD-10-CM

## 2014-05-15 DIAGNOSIS — F319 Bipolar disorder, unspecified: Secondary | ICD-10-CM

## 2014-05-15 DIAGNOSIS — M797 Fibromyalgia: Secondary | ICD-10-CM

## 2014-05-15 DIAGNOSIS — E785 Hyperlipidemia, unspecified: Secondary | ICD-10-CM

## 2014-05-15 HISTORY — DX: Procedure and treatment not carried out because of patient's decision for unspecified reasons: Z53.20

## 2014-05-15 LAB — LIPID PANEL
CHOL/HDL RATIO: 6.6 ratio
CHOLESTEROL: 205 mg/dL — AB (ref 0–200)
HDL: 31 mg/dL — ABNORMAL LOW (ref >39)
LDL Cholesterol: 123 mg/dL — ABNORMAL HIGH (ref 0–99)
TRIGLYCERIDES: 257 mg/dL — AB (ref <150)
VLDL: 51 mg/dL — AB (ref 0–40)

## 2014-05-15 LAB — COMPREHENSIVE METABOLIC PANEL
ALK PHOS: 69 U/L (ref 39–117)
ALT: 26 U/L (ref 0–35)
AST: 17 U/L (ref 0–37)
Albumin: 4.4 g/dL (ref 3.5–5.2)
BILIRUBIN TOTAL: 0.3 mg/dL (ref 0.2–1.2)
BUN: 7 mg/dL (ref 6–23)
CO2: 26 mEq/L (ref 19–32)
Calcium: 9.3 mg/dL (ref 8.4–10.5)
Chloride: 106 mEq/L (ref 96–112)
Creat: 0.96 mg/dL (ref 0.50–1.10)
GLUCOSE: 93 mg/dL (ref 70–99)
POTASSIUM: 4.1 meq/L (ref 3.5–5.3)
Sodium: 138 mEq/L (ref 135–145)
Total Protein: 7 g/dL (ref 6.0–8.3)

## 2014-05-15 LAB — CBC WITH DIFFERENTIAL/PLATELET
BASOS ABS: 0.1 10*3/uL (ref 0.0–0.1)
Basophils Relative: 1 % (ref 0–1)
EOS PCT: 2 % (ref 0–5)
Eosinophils Absolute: 0.3 10*3/uL (ref 0.0–0.7)
HCT: 37.6 % (ref 36.0–46.0)
Hemoglobin: 12.3 g/dL (ref 12.0–15.0)
LYMPHS ABS: 4.7 10*3/uL — AB (ref 0.7–4.0)
Lymphocytes Relative: 36 % (ref 12–46)
MCH: 27.4 pg (ref 26.0–34.0)
MCHC: 32.7 g/dL (ref 30.0–36.0)
MCV: 83.7 fL (ref 78.0–100.0)
Monocytes Absolute: 0.9 10*3/uL (ref 0.1–1.0)
Monocytes Relative: 7 % (ref 3–12)
NEUTROS PCT: 54 % (ref 43–77)
Neutro Abs: 7.1 10*3/uL (ref 1.7–7.7)
PLATELETS: 383 10*3/uL (ref 150–400)
RBC: 4.49 MIL/uL (ref 3.87–5.11)
RDW: 16.8 % — AB (ref 11.5–15.5)
WBC: 13.1 10*3/uL — ABNORMAL HIGH (ref 4.0–10.5)

## 2014-05-15 LAB — POCT GLYCOSYLATED HEMOGLOBIN (HGB A1C): Hemoglobin A1C: 5.7

## 2014-05-15 NOTE — Assessment & Plan Note (Signed)
Information given for mammogram and pt will call.

## 2014-05-15 NOTE — Assessment & Plan Note (Signed)
Information given today, pt will call number and will continue to follow.  Information given and explained to pt today.

## 2014-05-15 NOTE — Progress Notes (Signed)
Bridget [name] is a 53 y.o. who presents today for yearly physical.  Pt has not had lab work done in the past year.  She states she has Hx of Bipolar II disorder.  Still smoking 1 ppd and is interested in quitting.    Past Medical History  Diagnosis Date  . Depression   . Bipolar II disorder   . Osteoarthritis   . Obesity, morbid     History   Social History  . Marital Status: Single    Spouse Name: N/A    Number of Children: N/A  . Years of Education: N/A   Occupational History  . Not on file.   Social History Main Topics  . Smoking status: Current Some Day Smoker -- 0.50 packs/day for 20 years    Types: Cigarettes  . Smokeless tobacco: Not on file  . Alcohol Use: No  . Drug Use: No  . Sexual Activity: No   Other Topics Concern  . Not on file   Social History Narrative  . No narrative on file    Family History  Problem Relation Age of Onset  . Bipolar disorder Sister   . Hypertension Sister   . Hypertension Brother   . Hypertension Other   . Hypertension Mother   . Rheum arthritis Mother   . Hypertension Maternal Aunt   . Cancer Father     Current Outpatient Prescriptions on File Prior to Visit  Medication Sig Dispense Refill  . albuterol (PROVENTIL HFA;VENTOLIN HFA) 108 (90 BASE) MCG/ACT inhaler Inhale 2 puffs into the lungs every 4 (four) hours as needed for wheezing or shortness of breath.  1 Inhaler  1  . amoxicillin (AMOXIL) 500 MG capsule Take 2 capsules (1,000 mg total) by mouth 2 (two) times daily.  28 capsule  0  . buPROPion (WELLBUTRIN SR) 150 MG 12 hr tablet Take 1 tablet (150 mg total) by mouth 2 (two) times daily.  60 tablet  1  . buPROPion (WELLBUTRIN SR) 150 MG 12 hr tablet Take 150 mg by mouth 2 (two) times daily.      . diclofenac (VOLTAREN) 50 MG EC tablet Take 50 mg by mouth 3 (three) times daily.      Marland Kitchen. HYDROcodone-acetaminophen (NORCO) 10-325 MG per tablet Take 1 tablet by mouth every 4 (four) hours as needed for pain.      Marland Kitchen.  ipratropium (ATROVENT) 0.03 % nasal spray Place 2 sprays into the nose every 12 (twelve) hours.  30 mL  1  . methocarbamol (ROBAXIN) 750 MG tablet Take 750 mg by mouth 3 (three) times daily.      . methocarbamol (ROBAXIN) 750 MG tablet TAKE 1 TABLET (750 MG TOTAL) BY MOUTH 3 (THREE) TIMES DAILY.  90 tablet  1  . paliperidone (INVEGA) 3 MG 24 hr tablet Take 3 mg by mouth every morning.      . predniSONE (DELTASONE) 50 MG tablet Take 1 tablet (50 mg total) by mouth daily.  5 tablet  0   No current facility-administered medications on file prior to visit.    Patient Information Form: Screening and ROS  AUDIT-C Score: 0 Do you feel safe in relationships? Yes.   PHQ-2:negative  Review of Symptoms  General:  Negative for nexplained weight loss, fever Skin: Negative for new or changing mole, sore that won't heal HEENT: Negative for trouble hearing, trouble seeing, ringing in ears, mouth sores, hoarseness, change in voice, dysphagia. CV:  Negative for chest pain, dyspnea, edema, palpitations Resp:  Negative for cough, dyspnea, hemoptysis GI: Negative for nausea, vomiting, diarrhea, constipation, abdominal pain, melena, hematochezia. GU: Negative for dysuria, incontinence, urinary hesitance, hematuria, vaginal or penile discharge, polyuria, sexual difficulty, lumps in testicle or breasts MSK: Negative for muscle cramps or aches, joint pain or swelling Neuro: Negative for headaches, weakness, numbness, dizziness, passing out/fainting  Physical Exam Filed Vitals:   05/15/14 1400  BP: 123/78  Pulse: 95  Temp: 99 F (37.2 C)    Gen: NAD, Well nourished, Well developed HEENT: PERLA, EOMI, [place]/AT Neck: no JVD Cardio: RRR, No murmurs/gallops/rubs Lungs: CTA, no wheezes, rhonchi, crackles Abd: NABS, soft nontender nondistended MSK: ROM normal  Neuro: CN 2-12 intact, MS 5/5 B/L UE and LE, +2 patellar and achilles relfex b/l  Psych: AAO x 3     Chemistry      Component Value Date/Time    NA 136 07/31/2013 0129   K 3.5 07/31/2013 0129   CL 102 07/31/2013 0129   CO2 23 07/31/2013 0129   BUN 6 07/31/2013 0129   CREATININE 0.68 07/31/2013 0129   CREATININE 0.74 11/01/2012 1509      Component Value Date/Time   CALCIUM 9.4 07/31/2013 0129   ALKPHOS 75 07/31/2013 0129   AST 18 07/31/2013 0129   ALT 14 07/31/2013 0129   BILITOT 0.3 07/31/2013 0129      Lab Results  Component Value Date   WBC 14.5* 07/31/2013   HGB 12.6 07/31/2013   HCT 36.8 07/31/2013   MCV 83.8 07/31/2013   PLT 365 07/31/2013   Lab Results  Component Value Date   TSH 0.701 11/01/2012   No results found for this basename: HGBA1C

## 2014-05-15 NOTE — Patient Instructions (Signed)
Please get your colonoscopy and mammogram as soon as possible.  Thanks, Dr. Paulina FusiHess

## 2014-05-15 NOTE — Assessment & Plan Note (Signed)
Managed by Pain clinic, she would like evaluation for home health RN and PT.  Will place today.

## 2014-05-16 ENCOUNTER — Encounter: Payer: Self-pay | Admitting: Family Medicine

## 2014-05-16 LAB — TSH: TSH: 1.599 u[IU]/mL (ref 0.350–4.500)

## 2014-05-29 ENCOUNTER — Other Ambulatory Visit: Payer: Self-pay | Admitting: Family Medicine

## 2014-05-29 DIAGNOSIS — N6001 Solitary cyst of right breast: Secondary | ICD-10-CM

## 2014-06-03 ENCOUNTER — Encounter: Payer: Self-pay | Admitting: Family Medicine

## 2014-06-03 ENCOUNTER — Ambulatory Visit (INDEPENDENT_AMBULATORY_CARE_PROVIDER_SITE_OTHER): Payer: Medicaid Other | Admitting: Family Medicine

## 2014-06-03 ENCOUNTER — Other Ambulatory Visit: Payer: Self-pay | Admitting: Family Medicine

## 2014-06-03 ENCOUNTER — Other Ambulatory Visit (HOSPITAL_COMMUNITY)
Admission: RE | Admit: 2014-06-03 | Discharge: 2014-06-03 | Disposition: A | Discharge status: Home / Self Care | Payer: Medicaid Other | Source: Ambulatory Visit | Attending: Family Medicine | Admitting: Family Medicine

## 2014-06-03 VITALS — BP 129/72 | HR 101 | Temp 97.9°F | Ht 61.0 in | Wt 222.0 lb

## 2014-06-03 DIAGNOSIS — Z113 Encounter for screening for infections with a predominantly sexual mode of transmission: Secondary | ICD-10-CM | POA: Diagnosis not present

## 2014-06-03 DIAGNOSIS — N898 Other specified noninflammatory disorders of vagina: Secondary | ICD-10-CM

## 2014-06-03 DIAGNOSIS — Z202 Contact with and (suspected) exposure to infections with a predominantly sexual mode of transmission: Secondary | ICD-10-CM

## 2014-06-03 LAB — POCT WET PREP (WET MOUNT): Clue Cells Wet Prep Whiff POC: POSITIVE

## 2014-06-03 MED ORDER — METRONIDAZOLE 500 MG PO TABS: 500.0000 mg | ORAL_TABLET | Freq: Two times a day (BID) | ORAL | Status: DC

## 2014-06-03 MED ORDER — FLUCONAZOLE 150 MG PO TABS: 150.0000 mg | ORAL_TABLET | Freq: Once | ORAL | Status: DC

## 2014-06-03 NOTE — Patient Instructions (Signed)

## 2014-06-03 NOTE — Progress Notes (Signed)
  Subjective:    Bridget [name] is a 53 y.o. female who presents for sexually transmitted disease check. Sexual history reviewed with the patient. STI Exposure: Recent exposure to possible STD w/ husband who was incarcerated for 8 yrs. Previous history of STI none. Current symptoms vaginal irritation: mild. Contraception: none Menstrual History: OB History   Grav Para Term Preterm Abortions TAB SAB Ect Mult Living                  No LMP recorded.    The following portions of the patient's history were reviewed and updated as appropriate: allergies, current medications, past family history, past medical history, past social history, past surgical history and problem list.  Review of Systems Pertinent items are noted in HPI.    Objective:    BP 129/72  Pulse 101  Temp(Src) 97.9 F (36.6 C) (Oral)  Ht  (1.549 m)  Wt 222 lb (100.699 kg)  BMI 41.97 kg/m2 General:   alert, cooperative and appears stated age  Lymph Nodes:   Cervical, supraclavicular, and axillary nodes normal.  Pelvis:  Vulva and vagina appear normal. Bimanual exam reveals normal uterus and adnexa. External genitalia: normal general appearance  Cultures:  GC and Chlamydia genprobes, HIV antibody blood test and RPR     Assessment:    Possible STD exposure    Plan:    Appropriate educational material was distributed See orders for STD cultures and assays

## 2014-06-04 LAB — HIV ANTIBODY (ROUTINE TESTING W REFLEX): HIV 1&2 Ab, 4th Generation: NONREACTIVE

## 2014-06-04 LAB — RPR

## 2014-06-05 ENCOUNTER — Encounter: Payer: Self-pay | Admitting: Family Medicine

## 2014-06-05 ENCOUNTER — Telehealth: Payer: Self-pay | Admitting: Family Medicine

## 2014-06-05 NOTE — Telephone Encounter (Signed)
Lab results negative for HIV/RPR/GC/Chlamydia.  Pt wants copy of this so will send letter as well.  Thanks, Twana First. Paulina Fusi, DO of [facility] 06/05/2014, 11:57 AM

## 2014-06-12 ENCOUNTER — Ambulatory Visit
Admission: RE | Admit: 2014-06-12 | Discharge: 2014-06-12 | Disposition: A | Discharge status: Home / Self Care | Payer: Medicaid Other | Source: Ambulatory Visit | Attending: Family Medicine | Admitting: Family Medicine

## 2014-06-12 DIAGNOSIS — N6001 Solitary cyst of right breast: Secondary | ICD-10-CM

## 2014-06-25 ENCOUNTER — Telehealth: Payer: Self-pay | Admitting: Family Medicine

## 2014-06-25 NOTE — Telephone Encounter (Signed)
Keisha from [facility] called and wanted to report a PHQ9 of 19. She said the patient goes to [place]. She also stated that the patient does take medications but none were present in the home and patient stated that she lost or misplaced them. jw

## 2014-06-25 NOTE — Telephone Encounter (Signed)
05-15-14: A1c and lipid panel done and both were a little high

## 2014-07-15 ENCOUNTER — Telehealth: Payer: Self-pay | Admitting: Clinical

## 2014-07-15 NOTE — Telephone Encounter (Signed)
CSW received a call from NekomaKeisha with [facility] that she faxed a PCS form a few weeks ago to be completed by PCP. CSW informed Deanna ArtisKeisha that CSW was not aware of request however CSW could begin the application and place it in PCP's box for completion.  CSW will fax the form to [facility] once it has been completed.  Theresia BoughNorma Cabrina Shiroma, MSW, LCSW [phone]

## 2014-08-19 ENCOUNTER — Other Ambulatory Visit: Payer: Self-pay | Admitting: Psychiatry

## 2014-08-19 DIAGNOSIS — M545 Low back pain: Secondary | ICD-10-CM

## 2014-08-27 ENCOUNTER — Ambulatory Visit
Admission: RE | Admit: 2014-08-27 | Discharge: 2014-08-27 | Disposition: A | Discharge status: Home / Self Care | Payer: Medicaid Other | Source: Ambulatory Visit | Attending: Psychiatry | Admitting: Psychiatry

## 2014-08-27 DIAGNOSIS — M545 Low back pain: Secondary | ICD-10-CM

## 2015-03-18 ENCOUNTER — Ambulatory Visit (INDEPENDENT_AMBULATORY_CARE_PROVIDER_SITE_OTHER): Payer: Medicaid Other | Admitting: Family Medicine

## 2015-03-18 ENCOUNTER — Encounter: Payer: Self-pay | Admitting: Family Medicine

## 2015-03-18 VITALS — BP 148/82 | HR 91 | Temp 98.3°F | Wt 220.0 lb

## 2015-03-18 DIAGNOSIS — J0140 Acute pansinusitis, unspecified: Secondary | ICD-10-CM

## 2015-03-18 MED ORDER — AZITHROMYCIN 250 MG PO TABS: ORAL_TABLET | ORAL | Status: DC

## 2015-03-18 MED ORDER — BENZONATATE 100 MG PO CAPS: 100.0000 mg | ORAL_CAPSULE | Freq: Two times a day (BID) | ORAL | Status: DC | PRN

## 2015-03-18 NOTE — Progress Notes (Signed)
   Subjective:    Patient ID: Bridget [name], female    DOB: Feb 19, 1961, 54 y.o.   MRN: [id number]  HPI: Pt presents to clinic for SDA for cough, sinus pressure / pain, sneezing and sore throat, all present for a couple of weeks. She reports she recently took amoxicillin (about a week ago, left over from a dental prescription), which helped her sore throat but now her congestion and sinus pain symptoms have worsened. Cough is productive of some colored sputum (brownish or greenish). She has no fevers, frank SOB, chest pain, abdominal pain. Cough in particular has been bothersome at night, keeping her up. She has been taking nighttime cough / cold medicines (generic Nyquil) and using cough lozenges, without much help.  Of note, pt's daughter had some similar symptoms which have resolved.  Review of Systems: As above.     Objective:   Physical Exam BP 148/82 mmHg  Pulse 91  Temp(Src) 98.3 F (36.8 C) (Oral)  Wt 220 lb (99.791 kg) Gen: well-appearing adult female in NAD but uncomfortable and audibly congested HEENT: [place]/AT, EOMI, PERRLA, MMM, TM's clear bilaterally  Nasal mucosae and posterior oropharynx moderately red without frank tonsillar swelling  Small amount of thick, whitish nasal discharge in nasal vault  Moderate tenderness over maxillary sinuses and nasal bridge Neck: supple, normal ROM, few mildly tender anterior cervical lymph nodes Cardio: RRR, no murmur appreciated Pulm: CTAB, no wheezes, normal WOB Abd: soft, nontender, BS+ Ext: warm, well-perfused, no LE edema     Assessment & Plan:  54yo female with likely sinusitis; appears probably viral initially now with bacterial superinfection - Rx for azithromycin for 5 days plus Tessalon BID PRN for cough - counseled on likely course of viral infection and that abx should clear symptoms if there is bacterial infection - advised in the future not to take left-over or partial abx courses - f/u with PCP Dr. Paulina Fusi as needed,  otherwise  Of note, pt had lab requisition slip from pain management with her and requested it be drawn here - advised pt that we can't process orders from other clinics here - provided address of lab across the [place] and otherwise advised pt to call pain management to ask where to have labs done, otherwise  Note FYI to Dr. Luellen Pucker, MD PGY-3, [facility] 03/18/2015, 5:30 PM

## 2015-03-18 NOTE — Patient Instructions (Signed)
Thank you for coming in, today!  I think you probably do have a sinus infection.  I want you to take an antibiotic called azithromycin for 5 days. Take two pills on day 1, then one pill each day on days 2-5.  For your cough, you can take Tessalon (benzonatate) up to twice per day for 10 days. DO NOT chew this medicine, swallow it whole.  Come back to see [place] as you need. You can go to [address] [employer], [address], to have your labs drawn. If they can't do it there, call your pain doctor to ask where you can get it done.  Please feel free to call with any questions or concerns at any time, at [phone]. --Dr. Casper Harrison

## 2015-03-19 ENCOUNTER — Telehealth: Payer: Self-pay | Admitting: Family Medicine

## 2015-03-19 NOTE — Telephone Encounter (Signed)
Paged to [facility] Emergency Line at approx [phone] by patient Bridget [name]. She was Seen at [facility] on 03/18/15 for likely sinusitis (presumed viral initially with bacterial superinfection), she had taken some left over amoxicillin previously, then started on Azithro z-pak on 6/15 also given Tessalon BID PRN. Currently complains of persistent cough (non-productive), that is causing her variety of pains (general, HA, back pain, sore throat). No history of Asthma / COPD. Denies any CP / tightness, SOB, fever/chills. Stated Tessalon BID is not helping cough at all, she has some rhinorrhea but seems to have dried up some, also taking OTC guaifenesin, other OTC cough medicine, warm tea with honey, lozenges, and on chronic pain with hydrocodone 7/325, flexeril, diclofenac. She seeks advice / medication, initially asks for steroids (she has family who have been treated with steroids for their breathing / coughing before, and she has been treated for her chronic pain with short courses).  I advised her to continue her Azithro z-pak, provided reassurance, unfortunately cough may linger even if she feels better in few days. Recommended to DC guaifenesin (can stimulate cough), increase Tessalon to TID PRN, continue other OTC / home remedies. Unable to rx controlled substance cough syrup. Do not think steroid is indicated (no asthma / COPD, SOB, wheezing per report).  Symptoms should improve with time, if worsening can return call otherwise, if stable may need to give 5-7 days. Will forward note to PCP Dr. Paulina Fusi and Dr. Casper Harrison who saw in [place] on 6/15 for review. Patient to call back if worsening, advised if she is significantly worse may schedule follow-up at [facility].  Saralyn Pilar, DO [facility], PGY-2

## 2015-03-19 NOTE — Progress Notes (Signed)
I was preceptor the day of this visit.   

## 2015-03-20 ENCOUNTER — Other Ambulatory Visit: Payer: Self-pay | Admitting: Family Medicine

## 2015-03-20 ENCOUNTER — Emergency Department (INDEPENDENT_AMBULATORY_CARE_PROVIDER_SITE_OTHER): Payer: Medicaid Other

## 2015-03-20 ENCOUNTER — Telehealth: Payer: Self-pay | Admitting: Family Medicine

## 2015-03-20 ENCOUNTER — Emergency Department (HOSPITAL_COMMUNITY)
Admission: EM | Admit: 2015-03-20 | Discharge: 2015-03-20 | Disposition: A | Discharge status: Home / Self Care | Payer: Medicaid Other | Source: Home / Self Care | Attending: Family Medicine | Admitting: Family Medicine

## 2015-03-20 ENCOUNTER — Encounter (HOSPITAL_COMMUNITY): Payer: Self-pay | Admitting: *Deleted

## 2015-03-20 DIAGNOSIS — J320 Chronic maxillary sinusitis: Secondary | ICD-10-CM | POA: Diagnosis not present

## 2015-03-20 MED ORDER — DEXTROMETHORPHAN POLISTIREX ER 30 MG/5ML PO SUER: 60.0000 mg | Freq: Two times a day (BID) | ORAL | Status: DC

## 2015-03-20 MED ORDER — IPRATROPIUM BROMIDE 0.06 % NA SOLN: 2.0000 | Freq: Four times a day (QID) | NASAL | Status: DC

## 2015-03-20 MED ORDER — DOXYCYCLINE HYCLATE 100 MG PO CAPS: 100.0000 mg | ORAL_CAPSULE | Freq: Two times a day (BID) | ORAL | Status: DC

## 2015-03-20 MED ORDER — HYDROCODONE-HOMATROPINE 5-1.5 MG/5ML PO SYRP: 5.0000 mL | ORAL_SOLUTION | Freq: Three times a day (TID) | ORAL | Status: DC | PRN

## 2015-03-20 NOTE — Telephone Encounter (Signed)
Will forward to Dr. Casper Harrison who had seen her for this.  Thanks [employer]. Paulina Fusi, DO of [facility] 03/20/2015, 2:23 PM

## 2015-03-20 NOTE — Discharge Instructions (Signed)
Drink plenty of fluids as discussed, use medicine as prescribed, and mucinex or delsym for cough. Return or see your doctor if further problems °

## 2015-03-20 NOTE — Telephone Encounter (Signed)
White Team: Please call pt to let her know I will leave an Rx for Hycodan her at the front desk to pick up at her convenience. She should f/u otherwise as needed. Thanks. --CMS

## 2015-03-20 NOTE — Telephone Encounter (Signed)
Pt called and wanted to know if the doctor is going to prescribe her some cough syrup with hydrocodone in it? jw

## 2015-03-20 NOTE — ED Provider Notes (Signed)
CSN: [id number]     Arrival date & time 03/20/15  1635 History   First MD Initiated Contact with Patient 03/20/15 1827     Chief Complaint  Patient presents with  . Cough   (Consider location/radiation/quality/duration/timing/severity/associated sxs/prior Treatment) Patient is a 54 y.o. female presenting with cough. The history is provided by the patient.  Cough Cough characteristics:  Non-productive and dry Severity:  Moderate Onset quality:  Gradual Duration:  2 weeks Progression:  Waxing and waning Chronicity:  New Smoker: no   Context: upper respiratory infection   Context comment:  Onset with head and ear congestion. seen by lmd on wed, given z-pack and tessalon. Associated symptoms: rhinorrhea   Associated symptoms: no fever, no shortness of breath and no wheezing     Past Medical History  Diagnosis Date  . Depression   . Bipolar II disorder   . Osteoarthritis   . Obesity, morbid    Past Surgical History  Procedure Laterality Date  . Rotator cuff repair      BILATERAL  . Bone spurs      REMOVED FROM SHOULDERS  . Eye surgery      STY REMOVED LEFT EYE  1992  . Total hip arthroplasty  08/15/2011    Procedure: TOTAL HIP ARTHROPLASTY;  Surgeon: Eulas Post;  Location: [facility] OR;  Service: Orthopedics;  Laterality: Right;   Family History  Problem Relation Age of Onset  . Bipolar disorder Sister   . Hypertension Sister   . Hypertension Brother   . Hypertension Other   . Hypertension Mother   . Rheum arthritis Mother   . Hypertension Maternal Aunt   . Cancer Father    History  Substance Use Topics  . Smoking status: Current Some Day Smoker -- 0.50 packs/day for 20 years    Types: Cigarettes  . Smokeless tobacco: Not on file  . Alcohol Use: No   OB History    No data available     Review of Systems  Constitutional: Negative.  Negative for fever.  HENT: Positive for congestion, postnasal drip and rhinorrhea.   Respiratory: Positive for cough. Negative  for shortness of breath and wheezing.   Cardiovascular: Negative.     Allergies  Lyrica; Neurontin; Oxycodone hcl; and Tramadol  Home Medications   Prior to Admission medications   Medication Sig Start Date End Date Taking? Authorizing Provider  albuterol (PROVENTIL HFA;VENTOLIN HFA) 108 (90 BASE) MCG/ACT inhaler Inhale 2 puffs into the lungs every 4 (four) hours as needed for wheezing or shortness of breath. 09/29/13   Rodolph Bong, MD  azithromycin (ZITHROMAX) 250 MG tablet Take two pills on day 1, then one pill every day on days 2-5 03/18/15   Stephanie Coup Street, MD  benzonatate (TESSALON) 100 MG capsule Take 1 capsule (100 mg total) by mouth 2 (two) times daily as needed for cough. 03/18/15   Stephanie Coup Street, MD  buPROPion [facility] SR) 150 MG 12 hr tablet Take 1 tablet (150 mg total) by mouth 2 (two) times daily. 11/14/12   Durwin Reges, MD  dextromethorphan (DELSYM) 30 MG/5ML liquid Take 10 mLs (60 mg total) by mouth 2 (two) times daily. Prn cough 03/20/15   Linna Hoff, MD  diclofenac (VOLTAREN) 50 MG EC tablet Take 50 mg by mouth 3 (three) times daily.    Historical Provider, MD  doxycycline (VIBRAMYCIN) 100 MG capsule Take 1 capsule (100 mg total) by mouth 2 (two) times daily. 03/20/15   Quita Skye  Ethne Jeon, MD  fluconazole (DIFLUCAN) 150 MG tablet Take 1 tablet (150 mg total) by mouth once. 06/03/14   Bryan R Hess, DO  HYDROcodone-homatropine (HYCODAN) 5-1.5 MG/5ML syrup Take 5 mLs by mouth every 8 (eight) hours as needed for cough. 03/20/15   Stephanie Coup Street, MD  ipratropium (ATROVENT) 0.06 % nasal spray Place 2 sprays into both nostrils 4 (four) times daily. 03/20/15   Linna Hoff, MD  methocarbamol (ROBAXIN) 750 MG tablet TAKE 1 TABLET (750 MG TOTAL) BY MOUTH 3 (THREE) TIMES DAILY. 08/16/13   Twana First Hess, DO  metroNIDAZOLE (FLAGYL) 500 MG tablet Take 1 tablet (500 mg total) by mouth 2 (two) times daily. 06/03/14   Twana First Hess, DO  paliperidone (INVEGA) 3 MG 24 hr tablet Take 3  mg by mouth every morning.    Historical Provider, MD   BP 131/63 mmHg  Pulse 105  Temp(Src) 99.6 F (37.6 C) (Oral)  Resp 16  SpO2 97%  LMP  (Within Months) Physical Exam  Constitutional: She is oriented to person, place, and time. She appears well-developed and well-nourished. No distress.  HENT:  Right Ear: External ear normal.  Left Ear: External ear normal.  Nose: Mucosal edema and rhinorrhea present.  Mouth/Throat: Oropharynx is clear and moist.  Eyes: Conjunctivae are normal. Pupils are equal, round, and reactive to light.  Neck: Normal range of motion. Neck supple.  Cardiovascular: Normal rate, regular rhythm, normal heart sounds and intact distal pulses.   Pulmonary/Chest: Effort normal and breath sounds normal.  Lymphadenopathy:    She has no cervical adenopathy.  Neurological: She is alert and oriented to person, place, and time.  Skin: Skin is warm and dry.  Nursing note and vitals reviewed.   ED Course  Procedures (including critical care time) Labs Review Labs Reviewed - No data to display  Imaging Review Dg Chest 2 View  03/20/2015   CLINICAL DATA:  Cough, chest congestion, smoker  EXAM: CHEST  2 VIEW  COMPARISON:  09/29/2013  FINDINGS: The heart size and mediastinal contours are within normal limits. Both lungs are clear. The visualized skeletal structures are unremarkable. Right humeral bone anchor noted.  IMPRESSION: No active cardiopulmonary disease.   Electronically Signed   By: Christiana Pellant M.D.   On: 03/20/2015 19:04   X-rays reviewed and report per radiologist.   MDM   1. Chronic maxillary sinusitis        Linna Hoff, MD 03/20/15 Ernestina [place]

## 2015-03-20 NOTE — Telephone Encounter (Addendum)
[facility] After Hours Line  Patient reports recent office visit and diagnosis of sinusitis, currently being treated with azithromycin and tessalon. Patient states she would like something stop cough as it is making her head, back and neck hurt. She states she also has chronic pain which is the coughing is aggravating. She had been using OTC medications which haven't helped. She has tried tea with honey and lemon. She reports calling yesterday with similar symptoms and was told to increase her Tessalon to q8 hours. More detailed telephone note dated 03/19/15. Discussed patient options including continue OTC remedies in addition to Tessalon and antibiotic therapy, waiting for an appointment on Monday or going to an urgent care facility for reevaluation. Discussed that I will not prescribe new medications over the phone. Patient states she will go to an urgent care facility for evaluation.  Jacquelin Hawking, MD PGY-2, [facility] 03/20/2015, 5:56 PM

## 2015-03-20 NOTE — ED Notes (Signed)
Pt  Reports  Symptoms  Of  Cough   /  Congested      With  Symptoms     X   sev    Weeks    -  Seen  Recently   At  Her  PCP         And  Was      Rx     A  z  Pack  And  Tessalon  Pearls

## 2015-03-23 NOTE — Telephone Encounter (Signed)
Pt informed. Bridget [name] Dawn  

## 2015-05-07 ENCOUNTER — Telehealth: Payer: Self-pay | Admitting: Family Medicine

## 2015-05-07 NOTE — Telephone Encounter (Signed)
Debra from [facility] is calling to request the Med list for this pt for home care. She states that it may be faxed to her directly at [phone] and her direct phone number is [phone]. Thank you, Sadie Reynolds, ASA °

## 2015-05-07 NOTE — Telephone Encounter (Signed)
Med list faxed to debra. Jazmin Hartsell,CMA

## 2016-03-16 ENCOUNTER — Ambulatory Visit (INDEPENDENT_AMBULATORY_CARE_PROVIDER_SITE_OTHER): Payer: Medicaid Other | Admitting: Internal Medicine

## 2016-03-16 VITALS — BP 130/81 | HR 109 | Temp 98.0°F | Resp 20 | Ht 61.0 in | Wt 214.6 lb

## 2016-03-16 DIAGNOSIS — R05 Cough: Secondary | ICD-10-CM | POA: Insufficient documentation

## 2016-03-16 DIAGNOSIS — J0140 Acute pansinusitis, unspecified: Secondary | ICD-10-CM | POA: Diagnosis present

## 2016-03-16 DIAGNOSIS — R059 Cough, unspecified: Secondary | ICD-10-CM | POA: Insufficient documentation

## 2016-03-16 MED ORDER — DM-GUAIFENESIN ER 30-600 MG PO TB12: 1.0000 | ORAL_TABLET | Freq: Two times a day (BID) | ORAL | Status: DC

## 2016-03-16 MED ORDER — ALBUTEROL SULFATE HFA 108 (90 BASE) MCG/ACT IN AERS: 2.0000 | INHALATION_SPRAY | RESPIRATORY_TRACT | Status: DC | PRN

## 2016-03-16 MED ORDER — FLUTICASONE PROPIONATE 50 MCG/ACT NA SUSP: 2.0000 | Freq: Every day | NASAL | Status: DC

## 2016-03-16 NOTE — Progress Notes (Signed)
   [facility] Noralee CharsAsiyah Baylee Mccorkel, MD Phone: [phone]  Reason For Visit: Same day cough    # 2 weeks of congestion and cough. Patient caught cold from Grandkid - Producing yellowish green mucous with cough, positive for nasal congestion.  - No fever, however fatigued  - Smoking for over 20 years. Denies any long standing cough, no more than 1 week of cough usually - two colds previously this year.  - Patient was feeling SOB with coughing spell, yesterday used ProAir Inhaler which she said has helped  - Used cold and cough from the dollar store at night and during the day  morning from the [employer] store.  Social history- patient is a long time smoker  Objective: BP 130/81 mmHg  Pulse 109  Temp(Src) 98 F (36.7 C) (Oral)  Resp 20  Ht 5\' 1"  (1.549 m)  Wt 214 lb 9.6 oz (97.342 kg)  BMI 40.57 kg/m2  SpO2 97% Gen: NAD, alert, cooperative with exam HEENT: NCAT, oropharynx wnl, no nasal congestion  Neck: FROM, supple, no lymphadenopathy  CV: RRR, good S1/S2, no murmur, 2+ radial pulses  Resp: CTABL, no wheezes, non-labored  Assessment/Plan: See problem based a/p  Cough 1. Cough and congestion likely due to viral URI  - fluticasone (FLONASE) 50 MCG/ACT nasal spray; Place 2 sprays into both nostrils daily.  Dispense: 16 g; Refill: 6 - dextromethorphan-guaiFENesin (MUCINEX DM) 30-600 MG 12hr tablet; Take 1 tablet by mouth 2 (two) times daily.  Dispense: 30 tablet; Refill: 0 - albuterol (PROVENTIL HFA;VENTOLIN HFA) 108 (90 Base) MCG/ACT inhaler; Inhale 2 puffs into the lungs every 4 (four) hours as needed for wheezing or shortness of breath.  Dispense: 1 Inhaler; Refill: 1

## 2016-03-16 NOTE — Patient Instructions (Signed)
Cough Treatment - you should:Take Mucinex -DM use daily for cough and congestion, Flonase daily for congestion, and Albuterol inhaler as needed SOB.  You should be better in: the next week  Call us if you have severe shortness of breath, high fever or are not better in 1 week

## 2016-03-16 NOTE — Assessment & Plan Note (Signed)
1. Cough and congestion likely due to viral URI  - fluticasone (FLONASE) 50 MCG/ACT nasal spray; Place 2 sprays into both nostrils daily.  Dispense: 16 g; Refill: 6 - dextromethorphan-guaiFENesin (MUCINEX DM) 30-600 MG 12hr tablet; Take 1 tablet by mouth 2 (two) times daily.  Dispense: 30 tablet; Refill: 0 - albuterol (PROVENTIL HFA;VENTOLIN HFA) 108 (90 Base) MCG/ACT inhaler; Inhale 2 puffs into the lungs every 4 (four) hours as needed for wheezing or shortness of breath.  Dispense: 1 Inhaler; Refill: 1

## 2016-06-11 ENCOUNTER — Telehealth: Payer: Self-pay | Admitting: Obstetrics and Gynecology

## 2016-06-11 NOTE — Telephone Encounter (Signed)
Family Medicine Emergency Line Telephone Note   Patient states that she has been having multiple GI issues. States she is having nausea and reflux along with diarrhea. She feels like she is going to vomit but has not vomited yet. She denies any blood in stool. Denies any fevers. States she has tried taking an antacid and imodium without much relief. She is still able to tolerate PO.   Discussed how patient's symptoms seem mild and does not appear to be anything emergent. She may just have a stomach bug. She has never had these symptoms before. Gave her warning precautions on when to seek help. Reccommended continued conservative measures with good fluid hydration. Suggested that if symptoms persisted through weekend she should come into clinic to be evaluated Monday.   Patient voiced understanding and agreed to plan.   Caryl AdaJazma Phelps, DO 06/11/2016, 10:10 PM PGY-3, [facility]

## 2016-08-02 ENCOUNTER — Other Ambulatory Visit: Payer: Self-pay | Admitting: Psychiatry

## 2016-08-02 DIAGNOSIS — M545 Low back pain: Secondary | ICD-10-CM

## 2016-08-02 DIAGNOSIS — M47897 Other spondylosis, lumbosacral region: Secondary | ICD-10-CM

## 2016-09-09 ENCOUNTER — Other Ambulatory Visit: Payer: Medicaid Other

## 2016-09-21 ENCOUNTER — Inpatient Hospital Stay: Admission: RE | Admit: 2016-09-21 | Payer: Medicaid Other | Source: Ambulatory Visit

## 2016-09-21 ENCOUNTER — Other Ambulatory Visit: Payer: Medicaid Other

## 2016-11-01 ENCOUNTER — Other Ambulatory Visit (HOSPITAL_COMMUNITY)
Admission: RE | Admit: 2016-11-01 | Discharge: 2016-11-01 | Disposition: A | Discharge status: Home / Self Care | Payer: Medicaid Other | Source: Ambulatory Visit | Attending: Family Medicine | Admitting: Family Medicine

## 2016-11-01 ENCOUNTER — Encounter: Payer: Self-pay | Admitting: Family Medicine

## 2016-11-01 ENCOUNTER — Ambulatory Visit (INDEPENDENT_AMBULATORY_CARE_PROVIDER_SITE_OTHER): Payer: Medicaid Other | Admitting: Family Medicine

## 2016-11-01 VITALS — BP 118/72 | HR 103 | Temp 99.3°F | Wt 217.4 lb

## 2016-11-01 DIAGNOSIS — Z124 Encounter for screening for malignant neoplasm of cervix: Secondary | ICD-10-CM

## 2016-11-01 DIAGNOSIS — Z1151 Encounter for screening for human papillomavirus (HPV): Secondary | ICD-10-CM | POA: Diagnosis present

## 2016-11-01 DIAGNOSIS — Z01419 Encounter for gynecological examination (general) (routine) without abnormal findings: Secondary | ICD-10-CM | POA: Diagnosis present

## 2016-11-01 DIAGNOSIS — Z1211 Encounter for screening for malignant neoplasm of colon: Secondary | ICD-10-CM

## 2016-11-01 DIAGNOSIS — Z79899 Other long term (current) drug therapy: Secondary | ICD-10-CM

## 2016-11-01 DIAGNOSIS — Z0000 Encounter for general adult medical examination without abnormal findings: Secondary | ICD-10-CM

## 2016-11-01 DIAGNOSIS — Z1159 Encounter for screening for other viral diseases: Secondary | ICD-10-CM

## 2016-11-01 DIAGNOSIS — R6889 Other general symptoms and signs: Secondary | ICD-10-CM

## 2016-11-01 LAB — COMPLETE METABOLIC PANEL WITH GFR
ALK PHOS: 74 U/L (ref 33–130)
ALT: 11 U/L (ref 6–29)
AST: 14 U/L (ref 10–35)
Albumin: 4.3 g/dL (ref 3.6–5.1)
BUN: 11 mg/dL (ref 7–25)
CALCIUM: 9.6 mg/dL (ref 8.6–10.4)
CHLORIDE: 110 mmol/L (ref 98–110)
CO2: 25 mmol/L (ref 20–31)
Creat: 0.86 mg/dL (ref 0.50–1.05)
GFR, EST NON AFRICAN AMERICAN: 77 mL/min (ref >=60)
GFR, Est African American: 89 mL/min (ref >=60)
Glucose, Bld: 102 mg/dL — ABNORMAL HIGH (ref 65–99)
POTASSIUM: 3.7 mmol/L (ref 3.5–5.3)
Sodium: 142 mmol/L (ref 135–146)
Total Bilirubin: 0.4 mg/dL (ref 0.2–1.2)
Total Protein: 7.3 g/dL (ref 6.1–8.1)

## 2016-11-01 LAB — CBC
HCT: 38.4 % (ref 35.0–45.0)
HEMOGLOBIN: 12.4 g/dL (ref 11.7–15.5)
MCH: 27.9 pg (ref 27.0–33.0)
MCHC: 32.3 g/dL (ref 32.0–36.0)
MCV: 86.5 fL (ref 80.0–100.0)
MPV: 9.5 fL (ref 7.5–12.5)
Platelets: 388 10*3/uL (ref 140–400)
RBC: 4.44 MIL/uL (ref 3.80–5.10)
RDW: 14.8 % (ref 11.0–15.0)
WBC: 13.5 10*3/uL — AB (ref 3.8–10.8)

## 2016-11-01 LAB — LIPID PANEL
CHOL/HDL RATIO: 5.2 ratio — AB (ref <5.0)
CHOLESTEROL: 191 mg/dL (ref <200)
HDL: 37 mg/dL — ABNORMAL LOW (ref >50)
LDL Cholesterol: 125 mg/dL — ABNORMAL HIGH (ref <100)
TRIGLYCERIDES: 143 mg/dL (ref <150)
VLDL: 29 mg/dL (ref <30)

## 2016-11-01 NOTE — Patient Instructions (Addendum)
Get your mammogram, colonoscopy done.  I will contact you will the results of your labs.  If anything is abnormal, I will call you.  Otherwise, expect a copy to be mailed to you.   Influenza, Adult Influenza ("the flu") is an infection in the lungs, nose, and throat (respiratory tract). It is caused by a virus. The flu causes many common cold symptoms, as well as a high fever and body aches. It can make you feel very sick. The flu spreads easily from person to person (is contagious). Getting a flu shot (influenza vaccination) every year is the best way to prevent the flu. Follow these instructions at home:  Take over-the-counter and prescription medicines only as told by your doctor.  Use a cool mist humidifier to add moisture (humidity) to the air in your home. This can make it easier to breathe.  Rest as needed.  Drink enough fluid to keep your pee (urine) clear or pale yellow.  Cover your mouth and nose when you cough or sneeze.  Wash your hands with soap and water often, especially after you cough or sneeze. If you cannot use soap and water, use hand sanitizer.  Stay home from work or school as told by your doctor. Unless you are visiting your doctor, try to avoid leaving home until your fever has been gone for 24 hours without the use of medicine.  Keep all follow-up visits as told by your doctor. This is important. How is this prevented?  Getting a yearly (annual) flu shot is the best way to avoid getting the flu. You may get the flu shot in late summer, fall, or winter. Ask your doctor when you should get your flu shot.  Wash your hands often or use hand sanitizer often.  Avoid contact with people who are sick during cold and flu season.  Eat healthy foods.  Drink plenty of fluids.  Get enough sleep.  Exercise regularly. Contact a doctor if:  You get new symptoms.  You have:  Chest pain.  Watery poop (diarrhea).  A fever.  Your cough gets worse.  You start  to have more mucus.  You feel sick to your stomach (nauseous).  You throw up (vomit). Get help right away if:  You start to be short of breath or have trouble breathing.  Your skin or nails turn a bluish color.  You have very bad pain or stiffness in your neck.  You get a sudden headache.  You get sudden pain in your face or ear.  You cannot stop throwing up. This information is not intended to replace advice given to you by your health care provider. Make sure you discuss any questions you have with your health care provider. Document Released: 06/28/2008 Document Revised: 02/25/2016 Document Reviewed: 07/14/2015 Elsevier Interactive Patient Education  2017 [employer]

## 2016-11-01 NOTE — Progress Notes (Signed)
Bridget [name] is a 56 y.o. female presents to office today for annual physical exam examination.  Concerns today include:  1. Flu like symptoms She reports started about 3-4 days ago.  She reports sniffling, congestion, cough, body aches, low grade fever.  She reports that her oldest grandson tested positive for flu about a week ago.  Denies SOB, vomiting, diarrhea.  She has been staying hydrated.  Last eye exam: 2016 Last dental exam: 2016 Last colonoscopy: never Last mammogram: needs, last 2015 Last pap smear: 2014 normal, negative HPV  Past Medical History:  Diagnosis Date  . Bipolar II disorder (HCC)   . Depression   . Obesity, morbid (HCC)   . Osteoarthritis    Social History   Social History  . Marital status: Single    Spouse name: N/A  . Number of children: N/A  . Years of education: N/A   Occupational History  . Not on file.   Social History Main Topics  . Smoking status: Current Some Day Smoker    Packs/day: 0.50    Years: 20.00    Types: Cigarettes  . Smokeless tobacco: Never Used  . Alcohol use No  . Drug use: No  . Sexual activity: No   Other Topics Concern  . Not on file   Social History Narrative  . No narrative on file   Past Surgical History:  Procedure Laterality Date  . BONE SPURS     REMOVED FROM SHOULDERS  . EYE SURGERY     STY REMOVED LEFT EYE  1992  . ROTATOR CUFF REPAIR     BILATERAL  . TOTAL HIP ARTHROPLASTY  08/15/2011   Procedure: TOTAL HIP ARTHROPLASTY;  Surgeon: Eulas Post;  Location: [facility] OR;  Service: Orthopedics;  Laterality: Right;   Family History  Problem Relation Age of Onset  . Bipolar disorder Sister   . Hypertension Sister   . Hypertension Brother   . Hypertension Other   . Hypertension Mother   . Rheum arthritis Mother   . Hypertension Maternal Aunt   . Cancer Father      ROS: Review of Systems Constitutional: negative Eyes: negative Ears, nose, mouth, throat, and face: positive for nasal  congestion Respiratory: positive for cough Cardiovascular: negative Gastrointestinal: negative Genitourinary:negative, no menstrual cycle >1 year. no abnormal vaginal bleeding/ discharge Integument/breast: negative Hematologic/lymphatic: negative Musculoskeletal:positive for chronic pain managed by pain management clinic Neurological: negative Behavioral/Psych: positive for history of BP d/o Endocrine: negative Allergic/Immunologic: negative    Physical exam BP 118/72   Pulse (!) 103   Temp 99.3 F (37.4 C) (Oral)   Wt 217 lb 6.4 oz (98.6 kg)   LMP  (LMP Unknown)   SpO2 98%   BMI 41.08 kg/[name]  General appearance: alert, cooperative, appears stated age and no distress Head: Normocephalic, without obvious abnormality, atraumatic Eyes: negative findings: lids and lashes normal Ears: normal TM's and external ear canals both ears Nose: clear discharge Throat: lips, mucosa, and tongue normal; teeth and gums normal Neck: no adenopathy, supple, symmetrical, trachea midline and thyroid not enlarged, symmetric, no tenderness/mass/nodules Back: no tenderness to percussion or palpation, limited ROM of hips 2/2 OA and surgery Lungs: clear to auscultation bilaterally Heart: regular rate and rhythm, S1, S2 normal, no murmur, click, rub or gallop Abdomen: soft, non-tender; bowel sounds normal; no masses,  no organomegaly Pelvic: cervix normal in appearance, no adnexal masses or tenderness, no cervical motion tenderness, rectovaginal septum normal, uterus normal size, shape, and consistency, vagina  normal without discharge and atrophic external vagina Extremities: extremities normal, atraumatic, no cyanosis or edema Pulses: 2+ and symmetric Skin: Skin color, texture, turgor normal. No rashes or lesions Lymph nodes: Cervical, supraclavicular, and axillary nodes normal. Neurologic: Grossly normal    Assessment/ Plan: Bridget [name] here for annual physical exam.   1. Annual physical exam -  histories updated - smoking cessation encouraged  2. Screening for cervical cancer - Cytology - PAP Shaft w/ HPV  3. Screening for colon cancer - Handout provided - Ambulatory referral to Gastroenterology  4. Obesity, morbid (HCC) - COMPLETE METABOLIC PANEL WITH GFR - Lipid panel  5. High risk medication use. On medications for mental health disorders - COMPLETE METABOLIC PANEL WITH GFR - CBC  6. Need for hepatitis C screening test. Husband has +Hep C history. - Hepatitis C antibody  7. Flu-like symptoms - Outside of the window to treat flu - Encouraged PO hydration - Tylenol PRN fever/ myalgia - Rest - Return precautions reviewed.  Follow up prn  Bridget [name]. Bridget CountsGottschalk, DO PGY-3, [facility]

## 2016-11-02 LAB — HEPATITIS C ANTIBODY: HCV Ab: NEGATIVE

## 2016-11-04 LAB — CYTOLOGY - PAP
Diagnosis: NEGATIVE
HPV: NOT DETECTED

## 2016-11-07 ENCOUNTER — Encounter: Payer: Self-pay | Admitting: Family Medicine

## 2016-11-07 NOTE — Progress Notes (Signed)
ASCVD 10 year risk  6.2%.  Smoking cessation and Moderate Statin therapy recommended to reduce risk.  Smoking cessation alone reduces risk to 4.5%, Statin alone 4.7%, Combined statin and smoking cessation 3.4%.  Will plan to discuss initiating statin at next visit.  Smoking cessation recommended at her annual exam but will continue to emphasize this.

## 2016-12-05 ENCOUNTER — Other Ambulatory Visit: Payer: Self-pay | Admitting: Family Medicine

## 2016-12-05 ENCOUNTER — Telehealth: Payer: Self-pay | Admitting: *Deleted

## 2016-12-05 DIAGNOSIS — M25552 Pain in left hip: Secondary | ICD-10-CM

## 2016-12-05 NOTE — Telephone Encounter (Signed)
Patient called today requesting to be referred to:  Specialist:  Ortho   Provider name/phone number:  Dr. Dion SaucierLandau  Diagnosis (code):  Talk about left hip replacement (was supposed to have this done previously)  Date of last OV:  1.30.18  Note routed to provider and Referral Coordinator [facility]([place]).  Will call patient back regarding referral status.  Kenyotta Dorfman, Maryjo RochesterJessica Dawn

## 2016-12-05 NOTE — Telephone Encounter (Signed)
Referral placed.

## 2016-12-06 NOTE — Telephone Encounter (Signed)
Pt informed. Sharon T Saunders, CMA  

## 2016-12-07 NOTE — Telephone Encounter (Signed)
LMOVM. I have authorized 12 new visits [facility]. Patient can call and make her appointment at her convenience.

## 2016-12-14 ENCOUNTER — Telehealth: Payer: Self-pay | Admitting: Family Medicine

## 2016-12-14 NOTE — Telephone Encounter (Signed)
Pt had an appointment today to discuss colonoscopy and pt did not show up. ep

## 2017-01-05 ENCOUNTER — Other Ambulatory Visit: Payer: Self-pay | Admitting: Family Medicine

## 2017-01-12 ENCOUNTER — Encounter: Payer: Self-pay | Admitting: Family Medicine

## 2017-01-12 ENCOUNTER — Ambulatory Visit (INDEPENDENT_AMBULATORY_CARE_PROVIDER_SITE_OTHER): Payer: Medicaid Other | Admitting: Family Medicine

## 2017-01-12 VITALS — BP 144/80 | HR 84 | Temp 98.4°F | Ht 61.0 in | Wt 219.4 lb

## 2017-01-12 DIAGNOSIS — M25552 Pain in left hip: Secondary | ICD-10-CM | POA: Diagnosis not present

## 2017-01-12 DIAGNOSIS — F17200 Nicotine dependence, unspecified, uncomplicated: Secondary | ICD-10-CM | POA: Diagnosis not present

## 2017-01-12 DIAGNOSIS — Z01818 Encounter for other preprocedural examination: Secondary | ICD-10-CM

## 2017-01-12 DIAGNOSIS — R030 Elevated blood-pressure reading, without diagnosis of hypertension: Secondary | ICD-10-CM | POA: Diagnosis not present

## 2017-01-12 MED ORDER — IBUPROFEN 200 MG PO TABS: 200.0000 mg | ORAL_TABLET | Freq: Once | ORAL | Status: DC

## 2017-01-12 MED ORDER — IBUPROFEN 200 MG PO TABS
400.0000 mg | ORAL_TABLET | Freq: Once | ORAL | Status: AC
  Administered 2017-01-12: 400 mg via ORAL

## 2017-01-12 MED ORDER — NICOTINE 21 MG/24HR TD PT24: 21.0000 mg | MEDICATED_PATCH | Freq: Every day | TRANSDERMAL | 0 refills | Status: DC

## 2017-01-12 NOTE — Progress Notes (Signed)
Pt is a 56 y.o. female who is here for preoperative clearance for left total hip replacement on 02/07/17.  She reports that she is a 1-1.5 ppd smoker.  She has tried Wellbutrin in the past with some improvement but never stopped smoking.  She denies cough, SOB, wheeze, CP, palpitations, claudication, fevers, chills, unplanned weight loss.  She reports left hip pain and asks for Motrin.  1) High Risk Cardiac Conditions  1) Recent MI - No.  2) Decompensated Heart Failure - No.  3) Unstable angina - No.  4) Symptomatic arrythmia - No.  5) Sx Valvular Disease - No.  2) Intermediate Risk Factors - DM, CKD, CVA, CHF, CAD - No.  2) Functional Status - > 4 mets Yes.  Bridget [name] Activity Status Index: 24.2  3) Surgery Specific Risk - Intermediate (Carotid, Head and Neck, Orthopaedic )    4) Further Noninvasive evaluation -   1) EKG - No.   1) Hx of CVA, CAD, DM, CKD  2) Echo - No.   1) Worsening dyspnea   3) Stress Testing - Active Cardiac Disease - No.  5) Need for medical therapy - Beta Blocker, Statins indicated ? No.  PE: Vitals:   01/12/17 1349 01/12/17 1422  BP: (!) 148/82 (!) 144/80  Pulse: 84   Temp: 98.4 F (36.9 C)    Physical Examination: General appearance - alert, well appearing, and in no distress, overweight and somewhat uncomfortable sitting in chair.  Mental status - alert, oriented to person, place, and time Chest - clear to auscultation, no wheezes, rales or rhonchi, symmetric air entry Heart - normal rate, regular rhythm, normal S1, S2, no murmurs, rubs, clicks or gallops Musculoskeletal - left hip with limited AROM.  She is unable to sit comfortably on left. Extremities - peripheral pulses normal, no pedal edema, no clubbing or cyanosis  A/P: I have independently evaluated patient.  Bridget [name] is a 56 y.o. female who is low risk for an intermediate risk surgery. She was advised to discontinue Motrin/ NSAIDs 7 days prior to operation.  There are modifiable risk  factors.  Patient encouraged to stop smoking.  Nicotine patches and directions were provided today.  Bridget [name]'s RCRI calculation for MACE is: 0, which denotes a 0.4% risk of major cardiac event.    Her blood pressure is up a little today.  She is having increased pain in her left hip.  I suspect that this is playing a role.  No medications started today.  I encouraged her to check it once her pain has improved and if still elevated, would need to consider starting an anti-hypertensive.  Her pre-op forms will be faxed back to her orthopedist today.  She is encouraged to follow up after her surgery for any healthcare needs.  Bridget M. Nadine Counts, DO PGY-3, [facility]

## 2017-01-12 NOTE — Patient Instructions (Addendum)
I think that you are LOW risk for an INTERMEDIATE risk operation.  The likelihood of a cardiovascular event related to your surgery is low.  However, you smoking puts you at unnecessary increased risk of complications.  I will send your pre-op paperwork to your surgeon.   I have sent in your Nicotine patches.  Change these out daily.

## 2017-01-25 ENCOUNTER — Other Ambulatory Visit: Payer: Self-pay | Admitting: Orthopedic Surgery

## 2017-01-26 NOTE — Pre-Procedure Instructions (Addendum)
RANIE CHINCHILLA  01/26/2017      [id number] - [place], [place] - [address] AT [facility] [address] [place] [address] Phone: [phone] Fax: [phone]    Your procedure is scheduled on May 8 at 1100 AM.  Report to [facility] Admitting at 900 AM.  Call this number if you have problems the morning of surgery:[phone]    Remember:  Do not eat food or drink liquids after midnight.  Take these medicines the morning of surgery with A SIP OF WATER pain pill (if needed), methocarbamol (robaxin).  7 days prior to surgery STOP taking any Aspirin, Aleve, Naproxen, Ibuprofen, Motrin, Advil, Goody's, BC's, all herbal medications, fish oil, and all vitamins   Do not wear jewelry, make-up or nail polish.  Do not wear lotions, powders, or perfumes, or deoderant.  Do not shave 48 hours prior to surgery.    Do not bring valuables to the hospital.  [facility] is not responsible for any belongings or valuables.  Contacts, dentures or bridgework may not be worn into surgery.  Leave your suitcase in the car.  After surgery it may be brought to your room.  For patients admitted to the hospital, discharge time will be determined by your treatment team.  Patients discharged the day of surgery will not be allowed to drive home.   Special instructions:  [facility]- Preparing For Surgery  Before surgery, you can play an important role. Because skin is not sterile, your skin needs to be as free of germs as possible. You can reduce the number of germs on your skin by washing with CHG (chlorahexidine gluconate) Soap before surgery.  CHG is an antiseptic cleaner which kills germs and bonds with the skin to continue killing germs even after washing.  Please do not use if you have an allergy to CHG or antibacterial soaps. If your skin becomes reddened/irritated stop using the CHG.  Do not shave (including legs and underarms) for  at least 48 hours prior to first CHG shower. It is OK to shave your face.  Please follow these instructions carefully.   1. Shower the NIGHT BEFORE SURGERY and the MORNING OF SURGERY with CHG.   2. If you chose to wash your hair, wash your hair first as usual with your normal shampoo.  3. After you shampoo, rinse your hair and body thoroughly to remove the shampoo.  4. Use CHG as you would any other liquid soap. You can apply CHG directly to the skin and wash gently with a scrungie or a clean washcloth.   5. Apply the CHG Soap to your body ONLY FROM THE NECK DOWN.  Do not use on open wounds or open sores. Avoid contact with your eyes, ears, mouth and genitals (private parts). Wash genitals (private parts) with your normal soap.  6. Wash thoroughly, paying special attention to the area where your surgery will be performed.  7. Thoroughly rinse your body with warm water from the neck down.  8. DO NOT shower/wash with your normal soap after using and rinsing off the CHG Soap.  9. Pat yourself dry with a CLEAN TOWEL.   10. Wear CLEAN PAJAMAS   11. Place CLEAN SHEETS on your bed the night of your first shower and DO NOT SLEEP WITH PETS.    Day of Surgery: Do not apply any deodorants/lotions. Please wear clean clothes to the hospital/surgery center.  Please read over the following fact sheets that you were given. Pain Booklet, Coughing and Deep Breathing, MRSA Information and Surgical Site Infection Prevention

## 2017-01-26 NOTE — Progress Notes (Addendum)
PCP: Dr. Delynn Flavin  Cardiologist:   EKG: denies past year  Stress test:denies ever  ECHO: denies ever  Cardiac Cath: denies ever  Chest x-ray: denies past year

## 2017-01-27 ENCOUNTER — Encounter (HOSPITAL_COMMUNITY)
Admission: RE | Admit: 2017-01-27 | Discharge: 2017-01-27 | Disposition: A | Discharge status: Home / Self Care | Payer: Medicaid Other | Source: Ambulatory Visit | Attending: Orthopedic Surgery | Admitting: Orthopedic Surgery

## 2017-01-27 ENCOUNTER — Encounter (HOSPITAL_COMMUNITY): Payer: Self-pay

## 2017-01-27 DIAGNOSIS — M1612 Unilateral primary osteoarthritis, left hip: Secondary | ICD-10-CM | POA: Insufficient documentation

## 2017-01-27 DIAGNOSIS — Z01810 Encounter for preprocedural cardiovascular examination: Secondary | ICD-10-CM | POA: Insufficient documentation

## 2017-01-27 DIAGNOSIS — Z01812 Encounter for preprocedural laboratory examination: Secondary | ICD-10-CM | POA: Insufficient documentation

## 2017-01-27 LAB — CBC
HEMATOCRIT: 38.6 % (ref 36.0–46.0)
HEMOGLOBIN: 12.6 g/dL (ref 12.0–15.0)
MCH: 28.6 pg (ref 26.0–34.0)
MCHC: 32.6 g/dL (ref 30.0–36.0)
MCV: 87.5 fL (ref 78.0–100.0)
Platelets: 339 10*3/uL (ref 150–400)
RBC: 4.41 MIL/uL (ref 3.87–5.11)
RDW: 14.4 % (ref 11.5–15.5)
WBC: 12.3 10*3/uL — ABNORMAL HIGH (ref 4.0–10.5)

## 2017-01-27 LAB — BASIC METABOLIC PANEL
Anion gap: 7 (ref 5–15)
BUN: 10 mg/dL (ref 6–20)
CO2: 24 mmol/L (ref 22–32)
Calcium: 9.3 mg/dL (ref 8.9–10.3)
Chloride: 109 mmol/L (ref 101–111)
Creatinine, Ser: 0.68 mg/dL (ref 0.44–1.00)
GLUCOSE: 106 mg/dL — AB (ref 65–99)
POTASSIUM: 3.7 mmol/L (ref 3.5–5.1)
Sodium: 140 mmol/L (ref 135–145)

## 2017-01-27 LAB — SURGICAL PCR SCREEN
MRSA, PCR: NEGATIVE
Staphylococcus aureus: POSITIVE — AB

## 2017-02-03 ENCOUNTER — Other Ambulatory Visit: Payer: Self-pay | Admitting: Orthopedic Surgery

## 2017-02-07 ENCOUNTER — Encounter (HOSPITAL_COMMUNITY): Payer: Self-pay | Admitting: *Deleted

## 2017-02-07 ENCOUNTER — Inpatient Hospital Stay (HOSPITAL_COMMUNITY): Payer: Medicaid Other | Admitting: Certified Registered"

## 2017-02-07 ENCOUNTER — Inpatient Hospital Stay (HOSPITAL_COMMUNITY)
Admission: RE | Admit: 2017-02-07 | Discharge: 2017-02-09 | DRG: 470 | Disposition: A | Discharge status: Home Health | Payer: Medicaid Other | Source: Ambulatory Visit | Attending: Orthopedic Surgery | Admitting: Orthopedic Surgery

## 2017-02-07 ENCOUNTER — Encounter (HOSPITAL_COMMUNITY)
Admission: RE | Disposition: A | Discharge status: Home Health | Payer: Self-pay | Source: Ambulatory Visit | Attending: Orthopedic Surgery

## 2017-02-07 ENCOUNTER — Inpatient Hospital Stay (HOSPITAL_COMMUNITY): Payer: Medicaid Other

## 2017-02-07 DIAGNOSIS — Z79899 Other long term (current) drug therapy: Secondary | ICD-10-CM | POA: Diagnosis not present

## 2017-02-07 DIAGNOSIS — Z6841 Body Mass Index (BMI) 40.0 and over, adult: Secondary | ICD-10-CM | POA: Diagnosis not present

## 2017-02-07 DIAGNOSIS — F17210 Nicotine dependence, cigarettes, uncomplicated: Secondary | ICD-10-CM | POA: Diagnosis present

## 2017-02-07 DIAGNOSIS — Z818 Family history of other mental and behavioral disorders: Secondary | ICD-10-CM

## 2017-02-07 DIAGNOSIS — M1612 Unilateral primary osteoarthritis, left hip: Secondary | ICD-10-CM | POA: Diagnosis present

## 2017-02-07 DIAGNOSIS — F3181 Bipolar II disorder: Secondary | ICD-10-CM | POA: Diagnosis present

## 2017-02-07 DIAGNOSIS — Z888 Allergy status to other drugs, medicaments and biological substances status: Secondary | ICD-10-CM

## 2017-02-07 DIAGNOSIS — Z96641 Presence of right artificial hip joint: Secondary | ICD-10-CM | POA: Diagnosis present

## 2017-02-07 DIAGNOSIS — M1610 Unilateral primary osteoarthritis, unspecified hip: Secondary | ICD-10-CM

## 2017-02-07 HISTORY — PX: TOTAL HIP ARTHROPLASTY: SHX124

## 2017-02-07 SURGERY — ARTHROPLASTY, HIP, TOTAL,POSTERIOR APPROACH
Anesthesia: Spinal | Site: Hip | Laterality: Left

## 2017-02-07 MED ORDER — MENTHOL 3 MG MT LOZG: 1.0000 | LOZENGE | OROMUCOSAL | Status: DC | PRN

## 2017-02-07 MED ORDER — DIPHENHYDRAMINE HCL 12.5 MG/5ML PO ELIX: 12.5000 mg | ORAL_SOLUTION | ORAL | Status: DC | PRN

## 2017-02-07 MED ORDER — PHENYLEPHRINE 40 MCG/ML (10ML) SYRINGE FOR IV PUSH (FOR BLOOD PRESSURE SUPPORT)
PREFILLED_SYRINGE | INTRAVENOUS | Status: AC
  Filled 2017-02-07: qty 10

## 2017-02-07 MED ORDER — ZOLPIDEM TARTRATE 5 MG PO TABS
5.0000 mg | ORAL_TABLET | Freq: Every evening | ORAL | Status: DC | PRN
Start: 2017-02-07 — End: 2017-02-09
  Administered 2017-02-07: 5 mg via ORAL
  Filled 2017-02-07: qty 1

## 2017-02-07 MED ORDER — ONDANSETRON HCL 4 MG PO TABS: 4.0000 mg | ORAL_TABLET | Freq: Four times a day (QID) | ORAL | Status: DC | PRN

## 2017-02-07 MED ORDER — FENTANYL CITRATE (PF) 250 MCG/5ML IJ SOLN
INTRAMUSCULAR | Status: AC
  Filled 2017-02-07: qty 5

## 2017-02-07 MED ORDER — OXYCODONE HCL 5 MG PO TABS
5.0000 mg | ORAL_TABLET | ORAL | Status: DC | PRN
  Administered 2017-02-07 – 2017-02-09 (×12): 10 mg via ORAL
  Filled 2017-02-07 (×12): qty 2

## 2017-02-07 MED ORDER — SENNA-DOCUSATE SODIUM 8.6-50 MG PO TABS: 2.0000 | ORAL_TABLET | Freq: Every day | ORAL | 1 refills | Status: DC

## 2017-02-07 MED ORDER — SUCCINYLCHOLINE CHLORIDE 200 MG/10ML IV SOSY
PREFILLED_SYRINGE | INTRAVENOUS | Status: AC
  Filled 2017-02-07: qty 10

## 2017-02-07 MED ORDER — POLYETHYLENE GLYCOL 3350 17 G PO PACK: 17.0000 g | PACK | Freq: Every day | ORAL | Status: DC | PRN

## 2017-02-07 MED ORDER — PHENYLEPHRINE HCL 10 MG/ML IJ SOLN
INTRAMUSCULAR | Status: DC | PRN
  Administered 2017-02-07: 10 ug/min via INTRAVENOUS

## 2017-02-07 MED ORDER — LACTATED RINGERS IV SOLN
INTRAVENOUS | Status: DC
  Administered 2017-02-07 (×3): via INTRAVENOUS

## 2017-02-07 MED ORDER — NICOTINE 21 MG/24HR TD PT24
21.0000 mg | MEDICATED_PATCH | Freq: Every day | TRANSDERMAL | Status: DC
  Administered 2017-02-07 – 2017-02-09 (×3): 21 mg via TRANSDERMAL
  Filled 2017-02-07 (×4): qty 1

## 2017-02-07 MED ORDER — ROCURONIUM BROMIDE 10 MG/ML (PF) SYRINGE
PREFILLED_SYRINGE | INTRAVENOUS | Status: AC
  Filled 2017-02-07: qty 5

## 2017-02-07 MED ORDER — METOCLOPRAMIDE HCL 5 MG/ML IJ SOLN: 5.0000 mg | Freq: Three times a day (TID) | INTRAMUSCULAR | Status: DC | PRN

## 2017-02-07 MED ORDER — MIDAZOLAM HCL 2 MG/2ML IJ SOLN
INTRAMUSCULAR | Status: AC
  Filled 2017-02-07: qty 2

## 2017-02-07 MED ORDER — METHOCARBAMOL 500 MG PO TABS: 500.0000 mg | ORAL_TABLET | Freq: Two times a day (BID) | ORAL | 2 refills | Status: DC | PRN

## 2017-02-07 MED ORDER — DEXAMETHASONE SODIUM PHOSPHATE 10 MG/ML IJ SOLN
10.0000 mg | Freq: Once | INTRAMUSCULAR | Status: AC
  Administered 2017-02-08: 10 mg via INTRAVENOUS
  Filled 2017-02-07: qty 1

## 2017-02-07 MED ORDER — ALUM & MAG HYDROXIDE-SIMETH 200-200-20 MG/5ML PO SUSP
30.0000 mL | ORAL | Status: DC | PRN
  Administered 2017-02-09 (×2): 30 mL via ORAL
  Filled 2017-02-07 (×2): qty 30

## 2017-02-07 MED ORDER — METHOCARBAMOL 500 MG PO TABS
500.0000 mg | ORAL_TABLET | Freq: Four times a day (QID) | ORAL | Status: DC | PRN
  Administered 2017-02-07 – 2017-02-09 (×5): 500 mg via ORAL
  Filled 2017-02-07 (×5): qty 1

## 2017-02-07 MED ORDER — CEFAZOLIN SODIUM-DEXTROSE 2-4 GM/100ML-% IV SOLN
2.0000 g | Freq: Four times a day (QID) | INTRAVENOUS | Status: AC
  Administered 2017-02-07 – 2017-02-08 (×2): 2 g via INTRAVENOUS
  Filled 2017-02-07 (×2): qty 100

## 2017-02-07 MED ORDER — PHENOL 1.4 % MT LIQD: 1.0000 | OROMUCOSAL | Status: DC | PRN

## 2017-02-07 MED ORDER — ACETAMINOPHEN 325 MG PO TABS: 650.0000 mg | ORAL_TABLET | Freq: Four times a day (QID) | ORAL | Status: DC | PRN

## 2017-02-07 MED ORDER — METOCLOPRAMIDE HCL 5 MG PO TABS: 5.0000 mg | ORAL_TABLET | Freq: Three times a day (TID) | ORAL | Status: DC | PRN

## 2017-02-07 MED ORDER — ONDANSETRON HCL 4 MG/2ML IJ SOLN
4.0000 mg | Freq: Four times a day (QID) | INTRAMUSCULAR | Status: DC | PRN
  Administered 2017-02-07 – 2017-02-08 (×2): 4 mg via INTRAVENOUS
  Filled 2017-02-07: qty 2

## 2017-02-07 MED ORDER — RIVAROXABAN 10 MG PO TABS: 10.0000 mg | ORAL_TABLET | Freq: Every day | ORAL | 0 refills | Status: DC

## 2017-02-07 MED ORDER — METHOCARBAMOL 1000 MG/10ML IJ SOLN
500.0000 mg | Freq: Four times a day (QID) | INTRAVENOUS | Status: DC | PRN
  Filled 2017-02-07: qty 5

## 2017-02-07 MED ORDER — DOCUSATE SODIUM 100 MG PO CAPS
100.0000 mg | ORAL_CAPSULE | Freq: Two times a day (BID) | ORAL | Status: DC
  Administered 2017-02-07 – 2017-02-09 (×4): 100 mg via ORAL
  Filled 2017-02-07 (×4): qty 1

## 2017-02-07 MED ORDER — PROPOFOL 10 MG/ML IV BOLUS
INTRAVENOUS | Status: AC
  Filled 2017-02-07: qty 20

## 2017-02-07 MED ORDER — OXYCODONE-ACETAMINOPHEN 10-325 MG PO TABS: 1.0000 | ORAL_TABLET | Freq: Four times a day (QID) | ORAL | 0 refills | Status: DC | PRN

## 2017-02-07 MED ORDER — SENNA 8.6 MG PO TABS
1.0000 | ORAL_TABLET | Freq: Two times a day (BID) | ORAL | Status: DC
  Administered 2017-02-07 – 2017-02-09 (×4): 8.6 mg via ORAL
  Filled 2017-02-07 (×4): qty 1

## 2017-02-07 MED ORDER — POTASSIUM CHLORIDE IN NACL 20-0.45 MEQ/L-% IV SOLN
INTRAVENOUS | Status: DC
  Administered 2017-02-07: 17:00:00 via INTRAVENOUS
  Filled 2017-02-07 (×3): qty 1000

## 2017-02-07 MED ORDER — MIDAZOLAM HCL 5 MG/5ML IJ SOLN
INTRAMUSCULAR | Status: DC | PRN
  Administered 2017-02-07: 2 mg via INTRAVENOUS

## 2017-02-07 MED ORDER — ONDANSETRON HCL 4 MG PO TABS: 4.0000 mg | ORAL_TABLET | Freq: Three times a day (TID) | ORAL | 0 refills | Status: DC | PRN

## 2017-02-07 MED ORDER — HYDROMORPHONE HCL 1 MG/ML IJ SOLN
0.5000 mg | INTRAMUSCULAR | Status: DC | PRN
  Administered 2017-02-08: 0.5 mg via INTRAVENOUS
  Filled 2017-02-07: qty 1

## 2017-02-07 MED ORDER — BISACODYL 10 MG RE SUPP
10.0000 mg | Freq: Every day | RECTAL | Status: DC | PRN
Start: 2017-02-07 — End: 2017-02-09

## 2017-02-07 MED ORDER — RIVAROXABAN 10 MG PO TABS
10.0000 mg | ORAL_TABLET | Freq: Every day | ORAL | Status: DC
  Administered 2017-02-08 – 2017-02-09 (×2): 10 mg via ORAL
  Filled 2017-02-07 (×2): qty 1

## 2017-02-07 MED ORDER — ACETAMINOPHEN 650 MG RE SUPP: 650.0000 mg | Freq: Four times a day (QID) | RECTAL | Status: DC | PRN

## 2017-02-07 MED ORDER — LIDOCAINE HCL (CARDIAC) 20 MG/ML IV SOLN
INTRAVENOUS | Status: DC | PRN
  Administered 2017-02-07: 30 mg via INTRAVENOUS

## 2017-02-07 MED ORDER — SODIUM CHLORIDE 0.9 % IR SOLN
Status: DC | PRN
  Administered 2017-02-07: 1000 mL

## 2017-02-07 MED ORDER — BUPIVACAINE HCL (PF) 0.5 % IJ SOLN
INTRAMUSCULAR | Status: DC | PRN
  Administered 2017-02-07: 3 mL via INTRATHECAL

## 2017-02-07 MED ORDER — LIDOCAINE 2% (20 MG/ML) 5 ML SYRINGE
INTRAMUSCULAR | Status: AC
  Filled 2017-02-07: qty 5

## 2017-02-07 MED ORDER — CEFAZOLIN SODIUM-DEXTROSE 2-4 GM/100ML-% IV SOLN
2.0000 g | INTRAVENOUS | Status: AC
  Administered 2017-02-07: 2 g via INTRAVENOUS
  Filled 2017-02-07: qty 100

## 2017-02-07 MED ORDER — MAGNESIUM CITRATE PO SOLN: 1.0000 | Freq: Once | ORAL | Status: DC | PRN

## 2017-02-07 MED ORDER — PROPOFOL 500 MG/50ML IV EMUL
INTRAVENOUS | Status: DC | PRN
  Administered 2017-02-07: 50 ug/kg/min via INTRAVENOUS

## 2017-02-07 SURGICAL SUPPLY — 50 items
BIT DRILL 5/64X5 DISP (BIT) ×3 IMPLANT
BLADE SAW SAG 73X25 THK (BLADE) ×2
BLADE SAW SGTL 73X25 THK (BLADE) ×1 IMPLANT
CAPT HIP TOTAL 2 ×2 IMPLANT
CLOSURE STERI-STRIP 1/2X4 (GAUZE/BANDAGES/DRESSINGS) ×2
CLSR STERI-STRIP ANTIMIC 1/2X4 (GAUZE/BANDAGES/DRESSINGS) ×4 IMPLANT
COVER SURGICAL LIGHT HANDLE (MISCELLANEOUS) ×3 IMPLANT
DRAPE INCISE IOBAN 66X45 STRL (DRAPES) ×2 IMPLANT
DRAPE ORTHO SPLIT 77X108 STRL (DRAPES) ×6
DRAPE SURG ORHT 6 SPLT 77X108 (DRAPES) ×2 IMPLANT
DRAPE U-SHAPE 47X51 STRL (DRAPES) ×3 IMPLANT
DRSG MEPILEX BORDER 4X12 (GAUZE/BANDAGES/DRESSINGS) ×2 IMPLANT
DRSG MEPILEX BORDER 4X8 (GAUZE/BANDAGES/DRESSINGS) IMPLANT
DURAPREP 26ML APPLICATOR (WOUND CARE) ×3 IMPLANT
ELECT CAUTERY BLADE 6.4 (BLADE) ×3 IMPLANT
ELECT REM PT RETURN 9FT ADLT (ELECTROSURGICAL) ×3
ELECTRODE REM PT RTRN 9FT ADLT (ELECTROSURGICAL) ×1 IMPLANT
GLOVE BIOGEL PI ORTHO PRO SZ8 (GLOVE) ×4
GLOVE ORTHO TXT STRL SZ7.5 (GLOVE) ×3 IMPLANT
GLOVE PI ORTHO PRO STRL SZ8 (GLOVE) ×2 IMPLANT
GLOVE SURG ORTHO 8.0 STRL STRW (GLOVE) ×3 IMPLANT
GOWN STRL REUS W/ TWL XL LVL3 (GOWN DISPOSABLE) ×1 IMPLANT
GOWN STRL REUS W/TWL 2XL LVL3 (GOWN DISPOSABLE) ×3 IMPLANT
GOWN STRL REUS W/TWL XL LVL3 (GOWN DISPOSABLE) ×3
HOOD PEEL AWAY FACE SHEILD DIS (HOOD) ×8 IMPLANT
KIT BASIN OR (CUSTOM PROCEDURE TRAY) ×3 IMPLANT
KIT ROOM TURNOVER OR (KITS) ×3 IMPLANT
MANIFOLD NEPTUNE II (INSTRUMENTS) ×3 IMPLANT
NDL SAFETY ECLIPSE 18X1.5 (NEEDLE) ×1 IMPLANT
NEEDLE HYPO 18GX1.5 SHARP (NEEDLE) ×3
NS IRRIG 1000ML POUR BTL (IV SOLUTION) ×3 IMPLANT
PACK TOTAL JOINT (CUSTOM PROCEDURE TRAY) ×3 IMPLANT
PAD ARMBOARD 7.5X6 YLW CONV (MISCELLANEOUS) ×6 IMPLANT
PILLOW ABDUCTION HIP (SOFTGOODS) ×3 IMPLANT
PRESSURIZER FEMORAL UNIV (MISCELLANEOUS) IMPLANT
RETRIEVER SUT HEWSON (MISCELLANEOUS) ×3 IMPLANT
SUCTION FRAZIER HANDLE 10FR (MISCELLANEOUS) ×2
SUCTION TUBE FRAZIER 10FR DISP (MISCELLANEOUS) ×1 IMPLANT
SUT FIBERWIRE #2 38 REV NDL BL (SUTURE) ×9
SUT VIC AB 0 CT1 27 (SUTURE) ×3
SUT VIC AB 0 CT1 27XBRD ANBCTR (SUTURE) ×1 IMPLANT
SUT VIC AB 2-0 CT1 27 (SUTURE) ×3
SUT VIC AB 2-0 CT1 TAPERPNT 27 (SUTURE) ×1 IMPLANT
SUT VIC AB 3-0 SH 8-18 (SUTURE) ×3 IMPLANT
SUTURE FIBERWR#2 38 REV NDL BL (SUTURE) ×3 IMPLANT
SYR CONTROL 10ML LL (SYRINGE) ×3 IMPLANT
TOWEL OR 17X24 6PK STRL BLUE (TOWEL DISPOSABLE) ×3 IMPLANT
TOWEL OR 17X26 10 PK STRL BLUE (TOWEL DISPOSABLE) ×3 IMPLANT
TRAY CATH 16FR W/PLASTIC CATH (SET/KITS/TRAYS/PACK) IMPLANT
TRAY FOLEY CATH SILVER 14FR (SET/KITS/TRAYS/PACK) IMPLANT

## 2017-02-07 NOTE — Op Note (Signed)
02/07/2017  4:94 PM  PATIENT:  Bridget [name]   MRN: [id number]  PRE-OPERATIVE DIAGNOSIS:  Left hip primary localized osteoarthritis  POST-OPERATIVE DIAGNOSIS:  Left hip primary localized osteoarthritis  PROCEDURE:  Procedure(s): LEFT TOTAL HIP ARTHROPLASTY  PREOPERATIVE INDICATIONS:    Bridget [name] is an 56 y.o. female who has a diagnosis of  left hip primary localized osteoarthritis and elected for surgical management after failing conservative treatment.  The risks benefits and alternatives were discussed with the patient including but not limited to the risks of nonoperative treatment, versus surgical intervention including infection, bleeding, nerve injury, periprosthetic fracture, the need for revision surgery, dislocation, leg length discrepancy, blood clots, cardiopulmonary complications, morbidity, mortality, among others, and they were willing to proceed.     OPERATIVE REPORT     SURGEON:  Marchia Bond, MD    ASSISTANT:  Joya Gaskins, OPA-C  (Present throughout the entire procedure,  necessary for completion of procedure in a timely manner, assisting with retraction, instrumentation, and closure)     ANESTHESIA:  Spinal  ESTIMATED BLOOD LOSS: 846 mL    COMPLICATIONS:  None.     UNIQUE ASPECTS OF THE CASE:  Access to the acetabulum was again quite challenging, and her acetabulum was small. Major femoral neck cut not as low as I did on her contralateral side, in order to minimize having to restore length using the ball. The femur was slightly larger in size based on the reaming and broaching compared to her contralateral side. She was quite stable with the +1 neck, and it restored leg lengths. The reduction was actually fairly challenging, if anything she might be just a little bit long but I think she is matching her contralateral side. The acetabulum seated down reasonably well, but I did place a screw for additional backup. The drill was contained within the pelvic  vault, and I had a good and feel with the depth gauge contained within bone. I used a 25 mm screw.  COMPONENTS:  [employer] fit femur size 3 with a 32 mm + 1 head ball and a gription acetabular shell size 48 with an apex hole eliminator and a +4 neutral polyethylene liner.    PROCEDURE IN DETAIL:   The patient was met in the holding area and  identified.  The appropriate hip was identified and marked at the operative site.  The patient was then transported to the OR  and  placed under anesthesia.  At that point, the patient was  placed in the lateral decubitus position with the operative side up and  secured to the operating room table and all bony prominences padded.     The operative lower extremity was prepped from the iliac crest to the distal leg.  Sterile draping was performed.  Time out was performed prior to incision.      A routine posterolateral approach was utilized via sharp dissection  carried down to the subcutaneous tissue.  Gross bleeders were Bovie coagulated.  The iliotibial band was identified and incised along the length of the skin incision.  Self-retaining retractors were  inserted.  With the hip internally rotated, the short external rotators  were identified. The piriformis and capsule was tagged with FiberWire, and the hip capsule released in a T-type fashion.  The femoral neck was exposed, and I resected the femoral neck using the appropriate jig. This was performed at approximately a thumb's breadth above the lesser trochanter.    I then exposed the deep acetabulum,  cleared out any tissue including the ligamentum teres.  A wing retractor was placed.  After adequate visualization, I excised the labrum, and then sequentially reamed.  I placed the trial acetabulum, which seated nicely, and then impacted the real cup into place.  Appropriate version and inclination was confirmed clinically matching their bony anatomy, and also with the use of the jig. I did place a screw  for additional fixation and had excellent purchase.  A trial polyethylene liner was placed and the wing retractor removed.    I then prepared the proximal femur using the cookie-cutter, the lateralizing reamer, and then sequentially reamed and broached.  A trial broach, neck, and head was utilized, and I reduced the hip and it was found to have excellent stability with functional range of motion. Initially I started with a size 2, but the broach was loose after the trial, so I went to a 3. The trial components were then removed, and the real polyethylene liner was placed.  I then impacted the real femoral prosthesis into place into the appropriate version, slightly anteverted to the normal anatomy, and I impacted the real head ball into place. The hip was then reduced and taken through functional range of motion and found to have excellent stability. Leg lengths were restored.  I then used a 2 mm drill bits to pass the FiberWire suture from the capsule and piriformis through the greater trochanter, and secured this. Appropriate posterior capsular repair was achieved. I also closed the T in the capsule.  I then irrigated the hip copiously again with pulse lavage, and repaired the fascia with Vicryl, followed by Vicryl for the subcutaneous tissue, Monocryl for the skin, Steri-Strips and sterile gauze. The wounds were injected. The patient was then awakened and returned to PACU in stable and satisfactory condition. There were no complications.  Marchia Bond, MD Orthopedic Surgeon [phone]   02/07/2017 1:48 PM

## 2017-02-07 NOTE — Transfer of Care (Signed)
Immediate Anesthesia Transfer of Care Note  Patient: Bridget [name]  Procedure(s) Performed: Procedure(s): LEFT TOTAL HIP ARTHROPLASTY (Left)  Patient Location: PACU  Anesthesia Type:MAC  Level of Consciousness: awake, alert  and oriented  Airway & Oxygen Therapy: Patient connected to nasal cannula oxygen  Post-op Assessment: Report given to RN  Post vital signs: stable  Last Vitals:  Vitals:   02/07/17 0931  BP: (!) 117/95  Pulse: 84  Resp: 18  Temp: 37.2 C    Last Pain:  Vitals:   02/07/17 0931  TempSrc: Oral         Complications: No apparent anesthesia complications

## 2017-02-07 NOTE — Anesthesia Procedure Notes (Signed)
Spinal  Patient location during procedure: OR Start time: 02/07/2017 11:29 AM End time: 02/07/2017 11:36 AM Staffing Anesthesiologist: Val EagleMOSER, Kadey Mihalic Preanesthetic Checklist Completed: patient identified, surgical consent, pre-op evaluation, timeout performed, IV checked, risks and benefits discussed and monitors and equipment checked Spinal Block Patient position: sitting Prep: site prepped and draped and DuraPrep Patient monitoring: heart rate, cardiac monitor, continuous pulse ox and blood pressure Approach: midline Location: L3-4 Injection technique: single-shot Needle Needle type: Pencan  Needle gauge: 24 G Needle length: 10 cm Assessment Sensory level: T6

## 2017-02-07 NOTE — Anesthesia Preprocedure Evaluation (Addendum)
Anesthesia Evaluation  Patient identified by MRN, date of birth, ID band Patient awake    History of Anesthesia Complications Negative for: history of anesthetic complications  Airway Mallampati: II   Neck ROM: Full    Dental  (+) Teeth Intact   Pulmonary Current Smoker,    breath sounds clear to auscultation       Cardiovascular negative cardio ROS   Rhythm:Regular Rate:Normal     Neuro/Psych    GI/Hepatic negative GI ROS, Neg liver ROS,   Endo/Other  negative endocrine ROS  Renal/GU negative Renal ROS     Musculoskeletal  (+) Arthritis , Fibromyalgia -  Abdominal (+) + obese,   Peds  Hematology   Anesthesia Other Findings   Reproductive/Obstetrics                            Anesthesia Physical Anesthesia Plan  ASA: II  Anesthesia Plan: Spinal   Post-op Pain Management:    Induction: Intravenous  Airway Management Planned: Natural Airway  Additional Equipment:   Intra-op Plan:   Post-operative Plan:   Informed Consent: I have reviewed the patients History and Physical, chart, labs and discussed the procedure including the risks, benefits and alternatives for the proposed anesthesia with the patient or authorized representative who has indicated his/her understanding and acceptance.     Plan Discussed with: CRNA  Anesthesia Plan Comments:         Anesthesia Quick Evaluation

## 2017-02-07 NOTE — Discharge Instructions (Signed)

## 2017-02-07 NOTE — H&P (Signed)
PREOPERATIVE H&[name]  Chief Complaint: OA LEFT HIP  HPI: Bridget [name] is a 56 y.o. female who presents for preoperative history and physical with a diagnosis of OA LEFT HIP. Symptoms are rated as moderate to severe, and have been worsening.  This is significantly impairing activities of daily living.  She has elected for surgical management.   She has failed injections, activity modification, anti-inflammatories, and assistive devices.  Preoperative X-rays demonstrate end stage degenerative changes with osteophyte formation, loss of joint space, subchondral sclerosis. She has had the contralateral side done and is happy with the results.  Past Medical History:  Diagnosis Date  . Bipolar II disorder (HCC)   . Depression   . Obesity, morbid (HCC)   . Osteoarthritis    Past Surgical History:  Procedure Laterality Date  . BONE SPURS     REMOVED FROM SHOULDERS  . EYE SURGERY     STY REMOVED LEFT EYE  1992  . ROTATOR CUFF REPAIR     BILATERAL  . TOTAL HIP ARTHROPLASTY  08/15/2011   Procedure: TOTAL HIP ARTHROPLASTY;  Surgeon: Bridget Post;  Location: [facility] OR;  Service: Orthopedics;  Laterality: Right;   Social History   Social History  . Marital status: Single    Spouse name: N/A  . Number of children: N/A  . Years of education: N/A   Social History Main Topics  . Smoking status: Current Some Day Smoker    Packs/day: 0.50    Years: 20.00    Types: Cigarettes  . Smokeless tobacco: Never Used  . Alcohol use No  . Drug use: No  . Sexual activity: Yes   Other Topics Concern  . None   Social History Narrative  . None   Family History  Problem Relation Age of Onset  . Bipolar disorder Sister   . Hypertension Sister   . Hypertension Brother   . Diabetes Brother   . Heart disease Brother   . Hypertension Other   . Hypertension Mother   . Rheum arthritis Mother   . Heart disease Mother   . Hypertension Maternal Aunt   . Cancer Father   . Asthma Daughter   .  Asthma Son    Allergies  Allergen Reactions  . Lyrica [Pregabalin] Shortness Of Breath    ?allergy  . Neurontin [Gabapentin] Other (See Comments)    Lightheaded, see spots  . Tramadol     Makes feet tingle   Prior to Admission medications   Medication Sig Start Date End Date Taking? Authorizing Provider  diphenhydramine-acetaminophen (TYLENOL PM) 25-500 MG TABS tablet Take 2-3 tablets by mouth at bedtime as needed (for sleep.).   Yes [provider]  HYDROcodone-acetaminophen (NORCO) 10-325 MG tablet TAKE 1 TABLET BY MOUTH 4 TIMES A DAY AS NEEDED FOR 30 DAYS 12/08/16  Yes [provider]  ibuprofen (ADVIL,MOTRIN) 600 MG tablet Take 600 mg by mouth 3 (three) times daily as needed. 12/08/16  Yes [provider]  methocarbamol (ROBAXIN) 500 MG tablet Take 500 mg by mouth 2 (two) times daily as needed. 11/24/16  Yes [provider]  nicotine (NICODERM CQ - DOSED IN MG/24 HOURS) 21 mg/24hr patch Place 1 patch (21 mg total) onto the skin daily. 01/12/17  Yes Gottschalk, Kathie Rhodes M, DO     Positive ROS: All other systems have been reviewed and were otherwise negative with the exception of those mentioned in the HPI and as above.  Physical Exam:  Estimated body mass index is 41.68  kg/m as calculated from the following:   Height as of 01/27/17: 5' 0.5" (1.537 m).   Weight as of this encounter: 98.4 kg (217 lb).  General: Alert, no acute distress Cardiovascular: No pedal edema Respiratory: No cyanosis, no use of accessory musculature GI: No organomegaly, abdomen is soft and non-tender Skin: No lesions in the area of chief complaint Neurologic: Sensation intact distally Psychiatric: Patient is competent for consent with normal mood and affect Lymphatic: No axillary or cervical lymphadenopathy  MUSCULOSKELETAL: left hip AROM 0-80 with 0 IR, and 10 ER.    Assessment: OA LEFT HIP   Plan: Plan for Procedure(s): LEFT TOTAL HIP ARTHROPLASTY  The risks benefits  and alternatives were discussed with the patient including but not limited to the risks of nonoperative treatment, versus surgical intervention including infection, bleeding, nerve injury, periprosthetic fracture, the need for revision surgery, dislocation, leg length discrepancy, blood clots, cardiopulmonary complications, morbidity, mortality, among others, and they were willing to proceed.     Bridget [name],Bridget Witt P, MD Cell [phone]   02/07/2017 9:43 AM'

## 2017-02-07 NOTE — Anesthesia Procedure Notes (Signed)
Procedure Name: MAC Date/Time: 02/07/2017 12:00 PM Performed by: Lavell Luster Pre-anesthesia Checklist: Patient identified, Emergency Drugs available, Suction available, Patient being monitored and Timeout performed Patient Re-evaluated:Patient Re-evaluated prior to inductionOxygen Delivery Method: Nasal cannula Preoxygenation: Pre-oxygenation with 100% oxygen Intubation Type: IV induction Placement Confirmation: positive ETCO2 Dental Injury: Teeth and Oropharynx as per pre-operative assessment

## 2017-02-08 ENCOUNTER — Encounter (HOSPITAL_COMMUNITY): Payer: Self-pay | Admitting: Orthopedic Surgery

## 2017-02-08 LAB — BASIC METABOLIC PANEL
ANION GAP: 9 (ref 5–15)
BUN: 8 mg/dL (ref 6–20)
CALCIUM: 8.6 mg/dL — AB (ref 8.9–10.3)
CHLORIDE: 104 mmol/L (ref 101–111)
CO2: 22 mmol/L (ref 22–32)
CREATININE: 0.7 mg/dL (ref 0.44–1.00)
GFR calc non Af Amer: >60 mL/min (ref >60)
Glucose, Bld: 137 mg/dL — ABNORMAL HIGH (ref 65–99)
Potassium: 3.5 mmol/L (ref 3.5–5.1)
SODIUM: 135 mmol/L (ref 135–145)

## 2017-02-08 LAB — CBC
HEMATOCRIT: 31.1 % — AB (ref 36.0–46.0)
HEMOGLOBIN: 9.9 g/dL — AB (ref 12.0–15.0)
MCH: 27.5 pg (ref 26.0–34.0)
MCHC: 31.8 g/dL (ref 30.0–36.0)
MCV: 86.4 fL (ref 78.0–100.0)
Platelets: 296 10*3/uL (ref 150–400)
RBC: 3.6 MIL/uL — ABNORMAL LOW (ref 3.87–5.11)
RDW: 13.7 % (ref 11.5–15.5)
WBC: 11.3 10*3/uL — AB (ref 4.0–10.5)

## 2017-02-08 NOTE — Progress Notes (Signed)
Physical Therapy Treatment Patient Details Name: Bridget [name] MRN: [id number] DOB: Aug 05, 1961 Today's Date: 02/08/2017    History of Present Illness Pt is a 56 yo female s/p L posterior THA. Pt with PMH: of Bipolar d/c. PSH: R THA 2012.    PT Comments    Pt with improved ambulation tolerance and pattern this date. Acute PT to con't to follow    Follow Up Recommendations  Home health PT;Supervision/Assistance - 24 hour     Equipment Recommendations  3in1 (PT)    Recommendations for Other Services       Precautions / Restrictions Precautions Precautions: Posterior Hip Precaution Booklet Issued: Yes (comment) Precaution Comments: v/c's to adhere to functionally Required Braces or Orthoses: Other Brace/Splint Other Brace/Splint: abduction pillow when in bed Restrictions Weight Bearing Restrictions: Yes LLE Weight Bearing: Weight bearing as tolerated    Mobility  Bed Mobility               General bed mobility comments: pt on BSC upon PT arrival  Transfers Overall transfer level: Needs assistance Equipment used: Rolling walker (2 wheeled) Transfers: Sit to/from Stand Sit to Stand: Min assist         General transfer comment: minA for safety and to adhere to hip precautions  Ambulation/Gait Ambulation/Gait assistance: Min guard Ambulation Distance (Feet): 100 Feet Assistive device: Rolling walker (2 wheeled) Gait Pattern/deviations: Step-to pattern;Decreased stride length Gait velocity: slow Gait velocity interpretation: Below normal speed for age/gender General Gait Details: v/c's to pick up L LE instead of raise up R foot and swing L foot through , freq rest breaks   Stairs            Wheelchair Mobility    Modified Rankin (Stroke Patients Only)       Balance Overall balance assessment: Needs assistance Sitting-balance support: Single extremity supported;Feet supported Sitting balance-Leahy Scale: Good Sitting balance - Comments: able  to sit EOB with no back support   Standing balance support: Single extremity supported Standing balance-Leahy Scale: Fair Standing balance comment: pt held onto walker while performing pericare                            Cognition Arousal/Alertness: Awake/alert Behavior During Therapy: WFL for tasks assessed/performed Overall Cognitive Status: Within Functional Limits for tasks assessed                                 General Comments: known h/o bipolar d/c      Exercises      General Comments General comments (skin integrity, edema, etc.): pt easily distracted      Pertinent Vitals/Pain Pain Assessment: 0-10 Pain Score: 9  Pain Location: L hip  while ambulating Pain Descriptors / Indicators: Sore Pain Intervention(s): Monitored during session    Home Living Family/patient expects to be discharged to:: Private residence Living Arrangements: Children Available Help at Discharge: Family;Available 24 hours/day Type of Home: House Home Access: Level entry   Home Layout: One level Home Equipment: Walker - 2 wheels      Prior Function Level of Independence: Independent          PT Goals (current goals can now be found in the care plan section) Acute Rehab PT Goals Patient Stated Goal: home Progress towards PT goals: Progressing toward goals    Frequency    7X/week      PT Plan  Current plan remains appropriate    Co-evaluation              AM-PAC PT "6 Clicks" Daily Activity  Outcome Measure  Difficulty turning over in bed (including adjusting bedclothes, sheets and blankets)?: A Little Difficulty moving from lying on back to sitting on the side of the bed? : A Little Difficulty sitting down on and standing up from a chair with arms (e.g., wheelchair, bedside commode, etc,.)?: A Little Help needed moving to and from a bed to chair (including a wheelchair)?: A Little Help needed walking in hospital room?: A Little Help  needed climbing 3-5 steps with a railing? : A Little 6 Click Score: 18    End of Session Equipment Utilized During Treatment: Gait belt   Patient left: in chair;with chair alarm set;with nursing/sitter in room Nurse Communication: Mobility status PT Visit Diagnosis: Pain;Unsteadiness on feet (R26.81);Muscle weakness (generalized) (M62.81) Pain - Right/Left: Left Pain - part of body: Hip     Time: [id number] PT Time Calculation (min) (ACUTE ONLY): 33 min  Charges:  $Gait Training: 8-22 mins $Therapeutic Activity: 8-22 mins                    G Codes:       Lewis ShockAshly Domani Bakos, PT, DPT Pager #: [phone] Office #: [phone]    Yechezkel Fertig M Jomarie Gellis 02/08/2017, 4:28 PM

## 2017-02-08 NOTE — Evaluation (Signed)
Occupational Therapy Evaluation Patient Details Name: Bridget [name] MRN: [id number] DOB: 1960/11/14 Today's Date: 02/08/2017    History of Present Illness Pt is a 56 yo female s/p L posterior THA. Pt with PMH: of Bipolar d/c. PSH: R THA 2012.   Clinical Impression   PTA Pt independent in ADL and mobility. Pt currently mod A for ADL and min A for mobility with RW. Session focused on toilet transfer, initiated AE education, education for 3 in 1 as shower chair and posterior hip precautions. Pt will benefit from skilled OT in the acute setting to maximize safety and independence in ADL and functional transfers. Next session to focus on the Pt practicing with AE for LB bathing/dressing and provide handout for 3 in1  Shower transfer.     Follow Up Recommendations  No OT follow up;Supervision/Assistance - 24 hour (initially)    Equipment Recommendations  3 in 1 bedside commode    Recommendations for Other Services       Precautions / Restrictions Precautions Precautions: Posterior Hip Precaution Booklet Issued: Yes (comment) Precaution Comments: pt with good understanding and recall rom previous THA Required Braces or Orthoses: Other Brace/Splint Other Brace/Splint: abduction pillow when in bed Restrictions Weight Bearing Restrictions: Yes LLE Weight Bearing: Weight bearing as tolerated      Mobility Bed Mobility               General bed mobility comments: Pt sitting EOB with Nurse tech when OT entered the room  Transfers Overall transfer level: Needs assistance Equipment used: Rolling walker (2 wheeled) Transfers: Sit to/from Stand Sit to Stand: Min assist         General transfer comment: v/c's for safe hand placement, minA to steady pt during hand transition    Balance Overall balance assessment: Needs assistance Sitting-balance support: Single extremity supported;Feet supported Sitting balance-Leahy Scale: Good Sitting balance - Comments: able to sit EOB  with no back support   Standing balance support: Bilateral upper extremity supported;During functional activity Standing balance-Leahy Scale: Poor Standing balance comment: relies on RW for stability and balance                           ADL either performed or assessed with clinical judgement   ADL Overall ADL's : Needs assistance/impaired Eating/Feeding: Set up;Sitting   Grooming: Set up;Sitting;Wash/dry face;Wash/dry hands Grooming Details (indicate cue type and reason): on BSC Upper Body Bathing: Set up;Sitting   Lower Body Bathing: Moderate assistance   Upper Body Dressing : Set up;Sitting   Lower Body Dressing: Moderate assistance;Sit to/from stand;With adaptive equipment   Toilet Transfer: Minimal assistance;Stand-pivot;BSC;RW   Toileting- Clothing Manipulation and Hygiene: Min guard;Sit to/from stand Toileting - Clothing Manipulation Details (indicate cue type and reason): Pt able to manage hospital gown Tub/ Shower Transfer: Minimal assistance;Ambulation;3 in 1;Rolling walker Tub/Shower Transfer Details (indicate cue type and reason): reviewed use of 3 in 1 as shower chair Functional mobility during ADLs: Minimal assistance;Rolling walker General ADL Comments: initiated AE hip kit education (grabber/reacher, sock aide, long handle shoe horn, long handle sponge, toilet aide)     Vision Patient Visual Report: No change from baseline       Perception     Praxis      Pertinent Vitals/Pain Pain Assessment: 0-10 Pain Score: 4  Pain Location: L hip Pain Descriptors / Indicators: Sore;Discomfort Pain Intervention(s): Monitored during session;Limited activity within patient's tolerance;Repositioned     Hand Dominance Right  Extremity/Trunk Assessment Upper Extremity Assessment Upper Extremity Assessment: Overall WFL for tasks assessed   Lower Extremity Assessment Lower Extremity Assessment: LLE deficits/detail;Defer to PT evaluation LLE Deficits /  Details: s/p posterior hip surgery   Cervical / Trunk Assessment Cervical / Trunk Assessment: Normal   Communication Communication Communication: No difficulties   Cognition Arousal/Alertness: Awake/alert Behavior During Therapy: WFL for tasks assessed/performed Overall Cognitive Status: Within Functional Limits for tasks assessed                                 General Comments: known h/o bipolar d/c   General Comments  Pt urinated all over the floor on her way to the [facility], Pt appropriately cleaned by OT and NT    Exercises     Shoulder Instructions      Home Living Family/patient expects to be discharged to:: Private residence Living Arrangements: Children Available Help at Discharge: Family;Available 24 hours/day Type of Home: House Home Access: Level entry     Home Layout: One level     Bathroom Shower/Tub: Teacher, early years/pre: Standard     Home Equipment: Environmental consultant - 2 wheels          Prior Functioning/Environment Level of Independence: Independent                 OT Problem List: Decreased strength;Decreased activity tolerance;Impaired balance (sitting and/or standing);Decreased safety awareness;Decreased knowledge of use of DME or AE;Decreased knowledge of precautions;Obesity;Pain      OT Treatment/Interventions: Self-care/ADL training;DME and/or AE instruction;Therapeutic activities;Patient/family education;Balance training    OT Goals(Current goals can be found in the care plan section) Acute Rehab OT Goals Patient Stated Goal: home OT Goal Formulation: With patient Time For Goal Achievement: 02/22/17 Potential to Achieve Goals: Good ADL Goals Pt Will Perform Lower Body Bathing: with supervision;with caregiver independent in assisting;with adaptive equipment;sitting/lateral leans Pt Will Perform Lower Body Dressing: with min guard assist;with caregiver independent in assisting;with adaptive equipment;sit to/from  stand Pt Will Transfer to Toilet: with modified independence;ambulating (BSC over toilet; with RW) Pt Will Perform Toileting - Clothing Manipulation and hygiene: with supervision;with caregiver independent in assisting;with adaptive equipment;sit to/from stand Pt Will Perform Tub/Shower Transfer: Tub transfer;ambulating;3 in 1;rolling walker;with min guard assist Additional ADL Goal #1: Pt will maintain posterior hip precautions during ADL (either through caregiver assist or AE) with less than 1 verbal cue.  OT Frequency: Min 2X/week   Barriers to D/C:            Co-evaluation              AM-PAC PT "6 Clicks" Daily Activity     Outcome Measure Help from another person eating meals?: None Help from another person taking care of personal grooming?: A Little Help from another person toileting, which includes using toliet, bedpan, or urinal?: A Little Help from another person bathing (including washing, rinsing, drying)?: A Lot Help from another person to put on and taking off regular upper body clothing?: None Help from another person to put on and taking off regular lower body clothing?: A Little 6 Click Score: 19   End of Session Equipment Utilized During Treatment: Rolling walker Nurse Communication: Mobility status;Precautions;Other (comment) (status of Pt on BSC)  Activity Tolerance: Patient tolerated treatment well Patient left: Other (comment);with call bell/phone within reach (on [facility] (she wanted to attempt to have a BM))  OT Visit Diagnosis: Unsteadiness on feet (R26.81);Other  abnormalities of gait and mobility (R26.89);Pain Pain - Right/Left: Left Pain - part of body: Hip                Time: 0658-2608 OT Time Calculation (min): 13 min Charges:  OT General Charges $OT Visit: 1 Procedure OT Evaluation $OT Eval Moderate Complexity: 1 Procedure G-Codes:     Hulda Humphrey OTR/L [place] 02/08/2017, 4:16 PM

## 2017-02-08 NOTE — Anesthesia Postprocedure Evaluation (Addendum)
Anesthesia Post Note  Patient: Bridget [name]  Procedure(s) Performed: Procedure(s) (LRB): LEFT TOTAL HIP ARTHROPLASTY (Left)  Patient location during evaluation: PACU Anesthesia Type: Spinal Level of consciousness: oriented and awake and alert Pain management: pain level controlled Vital Signs Assessment: post-procedure vital signs reviewed and stable Respiratory status: spontaneous breathing, respiratory function stable and patient connected to nasal cannula oxygen Cardiovascular status: blood pressure returned to baseline and stable Postop Assessment: no headache and no backache Anesthetic complications: no       Last Vitals:  Vitals:   02/08/17 0100 02/08/17 0400  BP: 139/85 131/84  Pulse: (!) 112 (!) 110  Resp: 20 18  Temp: 37.7 C 37.2 C    Last Pain:  Vitals:   02/08/17 0706  TempSrc:   PainSc: 7                  Bayan Kushnir,JAMES TERRILL

## 2017-02-08 NOTE — Progress Notes (Signed)
Physical Therapy Treatment Patient Details Name: Bridget [name] MRN: [id number] DOB: 05/29/1961 Today's Date: 02/08/2017    History of Present Illness Pt is a 56 yo female s/p L posterior THA. Pt with PMH: of Bipolar d/c. PSH: R THA 2012.    PT Comments    Pt is s/p THA resulting in the deficits listed below (see PT Problem List). Pt amb limited by feeling of "I"m getting really hot, Cohen Doleman." Most likely due to recent administration of dilaudid. Pt will benefit from skilled PT to increase their independence and safety with mobility to allow discharge to the venue listed below.     Follow Up Recommendations  Home health PT;Supervision/Assistance - 24 hour     Equipment Recommendations  3in1 (PT)    Recommendations for Other Services       Precautions / Restrictions Precautions Precautions: Posterior Hip Precaution Booklet Issued: Yes (comment) Precaution Comments: pt with good understanding and recallf rom previous THA Required Braces or Orthoses: Other Brace/Splint Other Brace/Splint: abduction pillow when in bed Restrictions Weight Bearing Restrictions: Yes LLE Weight Bearing: Weight bearing as tolerated    Mobility  Bed Mobility Overal bed mobility: Needs Assistance Bed Mobility: Supine to Sit     Supine to sit: Min assist     General bed mobility comments: minA for L LE management off EOB,  and directional v/c's for long sit technique  Transfers Overall transfer level: Needs assistance Equipment used: Rolling walker (2 wheeled) Transfers: Sit to/from Stand Sit to Stand: Min assist         General transfer comment: v/c's for safe hand placement, minA to steady pt during hand transition  Ambulation/Gait Ambulation/Gait assistance: Min assist Ambulation Distance (Feet): 12 Feet Assistive device: Rolling walker (2 wheeled) Gait Pattern/deviations: WFL(Within Functional Limits);Decreased stance time - left;Decreased stride length;Antalgic Gait velocity:  slow Gait velocity interpretation: Below normal speed for age/gender General Gait Details: v/c's for initial sequencing of steps. limited by pt stating "i'm getting hot Bridget [name], I'm getting really hot"   Stairs            Wheelchair Mobility    Modified Rankin (Stroke Patients Only)       Balance Overall balance assessment:  (needs RW for safe standing/amb due to L THA)                                          Cognition Arousal/Alertness: Awake/alert Behavior During Therapy: WFL for tasks assessed/performed Overall Cognitive Status: Within Functional Limits for tasks assessed                                 General Comments: known h/o bipolar d/c      Exercises Total Joint Exercises Ankle Circles/Pumps: AROM;Both;10 reps;Supine Quad Sets: AROM;Left;10 reps;Supine Gluteal Sets: AROM;Both;10 reps;Supine    General Comments General comments (skin integrity, edema, etc.): surigical dressing intact      Pertinent Vitals/Pain Pain Assessment: 0-10 Pain Score: 5  Pain Location: L hip Pain Descriptors / Indicators: Sore (stiffness) Pain Intervention(s): Monitored during session    Home Living Family/patient expects to be discharged to:: Private residence Living Arrangements: Children Available Help at Discharge: Family;Available 24 hours/day Type of Home: House Home Access: Level entry   Home Layout: One level Home Equipment: Walker - 2 wheels      Prior  Function Level of Independence: Independent          PT Goals (current goals can now be found in the care plan section) Acute Rehab PT Goals Patient Stated Goal: home PT Goal Formulation: With patient Time For Goal Achievement: 02/15/17 Potential to Achieve Goals: Poor    Frequency    7X/week      PT Plan      Co-evaluation              AM-PAC PT "6 Clicks" Daily Activity  Outcome Measure  Difficulty turning over in bed (including adjusting bedclothes,  sheets and blankets)?: A Little Difficulty moving from lying on back to sitting on the side of the bed? : A Little Difficulty sitting down on and standing up from a chair with arms (e.g., wheelchair, bedside commode, etc,.)?: A Little Help needed moving to and from a bed to chair (including a wheelchair)?: A Little Help needed walking in hospital room?: A Little Help needed climbing 3-5 steps with a railing? : A Little 6 Click Score: 18    End of Session Equipment Utilized During Treatment: Gait belt Activity Tolerance: Other (comment) (limited by feeling of "i'm getting hot") Patient left: in chair;with chair alarm set;with nursing/sitter in room Nurse Communication: Mobility status PT Visit Diagnosis: Pain;Unsteadiness on feet (R26.81);Muscle weakness (generalized) (M62.81) Pain - Right/Left: Left Pain - part of body: Hip     Time: 1610-9604 PT Time Calculation (min) (ACUTE ONLY): 32 min  Charges:  $Gait Training: 8-22 mins                    G Codes:       Lewis Shock, PT, DPT Pager #: [phone] Office #: [phone]    Brieonna Crutcher M Edmon Magid 02/08/2017, 9:07 AM

## 2017-02-08 NOTE — Progress Notes (Signed)
Patient ID: Bridget [name], female   DOB: 03/08/1961, 56 y.o.   MRN: [id number]     Subjective:  Patient reports pain as mild to moderate.  Patient at the bed side with PT Denes any CP or SOB  Objective:   VITALS:   Vitals:   02/07/17 1549 02/07/17 2034 02/08/17 0100 02/08/17 0400  BP: 124/86 103/89 139/85 131/84  Pulse: 69 (!) 103 (!) 112 (!) 110  Resp: 18 18 20 18   Temp: 98.1 F (36.7 C) 98.6 F (37 C) 99.8 F (37.7 C) 99 F (37.2 C)  TempSrc: Oral Oral Oral Oral  SpO2: 100% 100% 100% 100%  Weight:        ABD soft Sensation intact distally Dorsiflexion/Plantar flexion intact Incision: dressing C/Bridget/I and no drainage   Lab Results  Component Value Date   WBC 11.3 (H) 02/08/2017   HGB 9.9 (L) 02/08/2017   HCT 31.1 (L) 02/08/2017   MCV 86.4 02/08/2017   PLT 296 02/08/2017   BMET    Component Value Date/Time   NA 135 02/08/2017 0510   K 3.5 02/08/2017 0510   CL 104 02/08/2017 0510   CO2 22 02/08/2017 0510   GLUCOSE 137 (H) 02/08/2017 0510   BUN 8 02/08/2017 0510   CREATININE 0.70 02/08/2017 0510   CREATININE 0.86 11/01/2016 1629   CALCIUM 8.6 (L) 02/08/2017 0510   GFRNONAA >60 02/08/2017 0510   GFRNONAA 77 11/01/2016 1629   GFRAA >60 02/08/2017 0510   GFRAA 89 11/01/2016 1629     Assessment/Plan: 1 Day Post-Op   Active Problems:   Primary localized osteoarthritis of left hip   Advance diet Up with therapy WBAT Dry dressing PRN Plan for DC tomorrow or Friday   Haskel KhanDOUGLAS [name], Bridget [name] 02/08/2017, 8:10 AM  Seen and agree with above.   Teryl LucyJoshua Brunetta Newingham, MD Cell [phone]

## 2017-02-09 LAB — BASIC METABOLIC PANEL
Anion gap: 10 (ref 5–15)
BUN: 9 mg/dL (ref 6–20)
CALCIUM: 8.9 mg/dL (ref 8.9–10.3)
CHLORIDE: 100 mmol/L — AB (ref 101–111)
CO2: 23 mmol/L (ref 22–32)
CREATININE: 0.69 mg/dL (ref 0.44–1.00)
Glucose, Bld: 151 mg/dL — ABNORMAL HIGH (ref 65–99)
Potassium: 3.4 mmol/L — ABNORMAL LOW (ref 3.5–5.1)
SODIUM: 133 mmol/L — AB (ref 135–145)

## 2017-02-09 LAB — CBC
HCT: 29.9 % — ABNORMAL LOW (ref 36.0–46.0)
HEMOGLOBIN: 9.6 g/dL — AB (ref 12.0–15.0)
MCH: 27.8 pg (ref 26.0–34.0)
MCHC: 32.1 g/dL (ref 30.0–36.0)
MCV: 86.7 fL (ref 78.0–100.0)
Platelets: 297 10*3/uL (ref 150–400)
RBC: 3.45 MIL/uL — ABNORMAL LOW (ref 3.87–5.11)
RDW: 13.7 % (ref 11.5–15.5)
WBC: 15.8 10*3/uL — ABNORMAL HIGH (ref 4.0–10.5)

## 2017-02-09 MED ORDER — POTASSIUM CHLORIDE CRYS ER 20 MEQ PO TBCR
20.0000 meq | EXTENDED_RELEASE_TABLET | Freq: Once | ORAL | Status: AC
  Administered 2017-02-09: 20 meq via ORAL
  Filled 2017-02-09: qty 1

## 2017-02-09 NOTE — Discharge Summary (Signed)
Physician Discharge Summary  Patient ID: Bridget [name] MRN: [id number] DOB/AGE: 55/01/1961 55 y.o.  Admit date: 02/07/2017 Discharge date: 02/09/2017  Admission Diagnoses:  Left hip osteoarthritis, primary localized.  Discharge Diagnoses:  Active Problems:   Primary localized osteoarthritis of left hip   Past Medical History:  Diagnosis Date  . Bipolar II disorder (HCC)   . Depression   . Obesity, morbid (HCC)   . Osteoarthritis     Surgeries: Procedure(s): LEFT TOTAL HIP ARTHROPLASTY on 02/07/2017   Consultants (if any):   Discharged Condition: Improved  Hospital Course: Bridget [name] is an 56 y.o. female who was admitted 02/07/2017 with a diagnosis of Left hip osteoarthritis, primary localized.and went to the operating room on 02/07/2017 and underwent the above named procedures.    She was given perioperative antibiotics:  Anti-infectives    Start     Dose/Rate Route Frequency Ordered Stop   02/07/17 1800  ceFAZolin (ANCEF) IVPB 2g/100 mL premix     2 g 200 mL/hr over 30 Minutes Intravenous Every 6 hours 02/07/17 1557 02/08/17 0131   02/07/17 0920  ceFAZolin (ANCEF) IVPB 2g/100 mL premix     2 g 200 mL/hr over 30 Minutes Intravenous On call to O.R. 02/07/17 0920 02/07/17 1153    .  She was given sequential compression devices, early ambulation, and xarelto for DVT prophylaxis.  She benefited maximally from the hospital stay and there were no complications.    Recent vital signs:  Vitals:   02/08/17 2029 02/09/17 0430  BP: 134/66 126/65  Pulse: (!) 108 (!) 106  Resp: 18 18  Temp: 98.2 F (36.8 C) 98.4 F (36.9 C)    Recent laboratory studies:  Lab Results  Component Value Date   HGB 9.6 (L) 02/09/2017   HGB 9.9 (L) 02/08/2017   HGB 12.6 01/27/2017   Lab Results  Component Value Date   WBC 15.8 (H) 02/09/2017   PLT 297 02/09/2017   Lab Results  Component Value Date   INR 1.61 (H) 08/17/2011   Lab Results  Component Value Date   NA 133 (L)  02/09/2017   K 3.4 (L) 02/09/2017   CL 100 (L) 02/09/2017   CO2 23 02/09/2017   BUN 9 02/09/2017   CREATININE 0.69 02/09/2017   GLUCOSE 151 (H) 02/09/2017    Discharge Medications:   Allergies as of 02/09/2017      Reactions   Lyrica [pregabalin] Shortness Of Breath   ?allergy   Neurontin [gabapentin] Other (See Comments)   Lightheaded, see spots   Tramadol    Makes feet tingle      Medication List    STOP taking these medications   HYDROcodone-acetaminophen 10-325 MG tablet Commonly known as:  NORCO   ibuprofen 600 MG tablet Commonly known as:  ADVIL,MOTRIN     TAKE these medications   diphenhydramine-acetaminophen 25-500 MG Tabs tablet Commonly known as:  TYLENOL PM Take 2-3 tablets by mouth at bedtime as needed (for sleep.).   methocarbamol 500 MG tablet Commonly known as:  ROBAXIN Take 1 tablet (500 mg total) by mouth 2 (two) times daily as needed.   nicotine 21 mg/24hr patch Commonly known as:  NICODERM CQ - dosed in mg/24 hours Place 1 patch (21 mg total) onto the skin daily.   ondansetron 4 MG tablet Commonly known as:  ZOFRAN Take 1 tablet (4 mg total) by mouth every 8 (eight) hours as needed for nausea or vomiting.   oxyCODONE-acetaminophen 10-325 MG tablet Commonly known  as:  PERCOCET Take 1-2 tablets by mouth every 6 (six) hours as needed for pain. MAXIMUM TOTAL ACETAMINOPHEN DOSE IS 4000 MG PER DAY   rivaroxaban 10 MG Tabs tablet Commonly known as:  XARELTO Take 1 tablet (10 mg total) by mouth daily.   sennosides-docusate sodium 8.6-50 MG tablet Commonly known as:  SENOKOT-S Take 2 tablets by mouth daily.            Durable Medical Equipment        Start     Ordered   02/09/17 1127  For home use only DME Shower stool  Once     02/09/17 1127      Diagnostic Studies: Dg Hip Port Unilat With Pelvis 1v Left  Result Date: 02/07/2017 CLINICAL DATA:  Left hip replacement EXAM: DG HIP (WITH OR WITHOUT PELVIS) 1V PORT LEFT COMPARISON:   None. FINDINGS: Interval total left hip arthroplasty without failure or complication. No fracture dislocation. Postsurgical changes in the surrounding soft tissues. Prior remote right total hip arthroplasty without failure or complication. IMPRESSION: Interval left total hip arthroplasty without failure or complication. Electronically Signed   By: Elige Ko   On: 02/07/2017 15:05    Disposition: 01-Home or Self Care    Follow-up Information    Teryl Lucy, MD. Schedule an appointment as soon as possible for a visit in 2 week(s).   Specialty:  Orthopedic Surgery Contact information: [address] [place] [address] [phone]        [facility] Follow up.   Specialty:  Home Health Services Why:  A representative from [facility] will contact you to arrange start date and time for your therapy. Contact information: [address] [place] [address] [phone]            Signed: Eulas Post 02/09/2017, 2:18 PM

## 2017-02-09 NOTE — Progress Notes (Signed)
Patient ID: Bridget [name], female   DOB: 06/21/1961, 56 y.o.   MRN: [id number]     Subjective:  Patient reports pain as mild.  Patient standing at the counter in the room with PT and trying to pack her bags for DC  Objective:   VITALS:   Vitals:   02/08/17 0100 02/08/17 0400 02/08/17 2029 02/09/17 0430  BP: 139/85 131/84 134/66 126/65  Pulse: (!) 112 (!) 110 (!) 108 (!) 106  Resp: 20 18 18 18   Temp: 99.8 F (37.7 C) 99 F (37.2 C) 98.2 F (36.8 C) 98.4 F (36.9 C)  TempSrc: Oral Oral Oral Oral  SpO2: 100% 100% 99% 98%  Weight:        ABD soft Sensation intact distally Dorsiflexion/Plantar flexion intact Incision: dressing C/D/I and no drainage   Lab Results  Component Value Date   WBC 15.8 (H) 02/09/2017   HGB 9.6 (L) 02/09/2017   HCT 29.9 (L) 02/09/2017   MCV 86.7 02/09/2017   PLT 297 02/09/2017   BMET    Component Value Date/Time   NA 133 (L) 02/09/2017 0539   K 3.4 (L) 02/09/2017 0539   CL 100 (L) 02/09/2017 0539   CO2 23 02/09/2017 0539   GLUCOSE 151 (H) 02/09/2017 0539   BUN 9 02/09/2017 0539   CREATININE 0.69 02/09/2017 0539   CREATININE 0.86 11/01/2016 1629   CALCIUM 8.9 02/09/2017 0539   GFRNONAA >60 02/09/2017 0539   GFRNONAA 77 11/01/2016 1629   GFRAA >60 02/09/2017 0539   GFRAA 89 11/01/2016 1629     Assessment/Plan: 2 Days Post-Op   Active Problems:   Primary localized osteoarthritis of left hip   Advance diet Up with therapy Discharge home with home health WBAT Dry dressing PRN Follow up as scheduled with Dr Jenell MillinerLandau   Bridget [name], Bridget [name] 02/09/2017, 9:23 AM  Discussed and agree with above.  Ordered kdur for hypokalemia, mild, also has potassium in IVF.  Plan dc home today.   Bridget LucyJoshua Mistina Coatney, MD Cell [phone]

## 2017-02-09 NOTE — Progress Notes (Signed)
Pt left floor via wheelchair accompanied by staff and family. 

## 2017-02-09 NOTE — Progress Notes (Signed)
Discharge instructions and Percocet prescription given. Pt and family verbalized understanding. VSS. Denies pain. Pt waiting on shower chair to be delivered.

## 2017-02-09 NOTE — Progress Notes (Signed)
Occupational Therapy Treatment Patient Details Name: Bridget [name] MRN: [id number] DOB: 10-Sep-1961 Today's Date: 02/09/2017    History of present illness Pt is a 56 yo female s/p L posterior THA. Pt with PMH: of Bipolar d/c. PSH: R THA 2012.   OT comments  Pt progressing towards goals. Pt performed tub transfer using shower seat with Min A and Mod VCs to sequence task and increase safety. Pt educated on AE for LB ADLs; pt states that she will not need AE because family with A. Continue to recommend dc home with 24/7 supervision to increase pt safety. All acute OT needs met and pt questions answered.   Follow Up Recommendations  No OT follow up;Supervision/Assistance - 24 hour    Equipment Recommendations  3 in 1 bedside commode    Recommendations for Other Services      Precautions / Restrictions Precautions Precautions: Posterior Hip Precaution Comments: reviewed Required Braces or Orthoses: Other Brace/Splint Other Brace/Splint: abduction pillow when in bed Restrictions Weight Bearing Restrictions: Yes LLE Weight Bearing: Weight bearing as tolerated       Mobility Bed Mobility               General bed mobility comments: up in chair upon arrival  Transfers Overall transfer level: Needs assistance Equipment used: Rolling walker (2 wheeled) Transfers: Sit to/from Stand Sit to Stand: Min guard         General transfer comment: min/guard with good technique today    Balance Overall balance assessment: Needs assistance Sitting-balance support: Feet supported;No upper extremity supported Sitting balance-Leahy Scale: Good     Standing balance support: During functional activity Standing balance-Leahy Scale: Fair                             ADL either performed or assessed with clinical judgement   ADL Overall ADL's : Needs assistance/impaired                       Lower Body Dressing Details (indicate cue type and reason): Pt  states that her family will help her. Educated on AE, but she states she will not use AE         Tub/ Shower Transfer: Tub transfer;Ambulation;Rolling walker;Minimal assistance;Cueing for sequencing;Cueing for safety;Shower Scientist, research (medical) Details (indicate cue type and reason): Pt performed tub transfer with Mod VCs and Min A.         Vision       Perception     Praxis      Cognition Arousal/Alertness: Awake/alert Behavior During Therapy: WFL for tasks assessed/performed Overall Cognitive Status: Within Functional Limits for tasks assessed                                 General Comments: known h/o bipolar d/c        Exercises     Shoulder Instructions       General Comments Pt easily distracted and required Mod VCs throughout session for sequencing    Pertinent Vitals/ Pain       Pain Assessment: Faces Faces Pain Scale: Hurts little more Pain Location: B hips with ambulation Pain Descriptors / Indicators: Sore Pain Intervention(s): Monitored during session  Home Living  Prior Functioning/Environment              Frequency  Min 2X/week        Progress Toward Goals  OT Goals(current goals can now be found in the care plan section)  Progress towards OT goals: Progressing toward goals  Acute Rehab OT Goals Patient Stated Goal: home OT Goal Formulation: With patient Time For Goal Achievement: 02/22/17 Potential to Achieve Goals: Good ADL Goals Pt Will Perform Lower Body Bathing: with supervision;with caregiver independent in assisting;with adaptive equipment;sitting/lateral leans Pt Will Perform Lower Body Dressing: with min guard assist;with caregiver independent in assisting;with adaptive equipment;sit to/from stand Pt Will Transfer to Toilet: with modified independence;ambulating Pt Will Perform Toileting - Clothing Manipulation and hygiene: with supervision;with  caregiver independent in assisting;with adaptive equipment;sit to/from stand Pt Will Perform Tub/Shower Transfer: Tub transfer;ambulating;3 in 1;rolling walker;with min guard assist Additional ADL Goal #1: Pt will maintain posterior hip precautions during ADL (either through caregiver assist or AE) with less than 1 verbal cue.  Plan Discharge plan remains appropriate    Co-evaluation                 AM-PAC PT "6 Clicks" Daily Activity     Outcome Measure   Help from another person eating meals?: None Help from another person taking care of personal grooming?: A Little Help from another person toileting, which includes using toliet, bedpan, or urinal?: A Little Help from another person bathing (including washing, rinsing, drying)?: A Lot Help from another person to put on and taking off regular upper body clothing?: None Help from another person to put on and taking off regular lower body clothing?: A Little 6 Click Score: 19    End of Session Equipment Utilized During Treatment: Rolling walker  OT Visit Diagnosis: Unsteadiness on feet (R26.81);Other abnormalities of gait and mobility (R26.89);Pain Pain - Right/Left: Left Pain - part of body: Hip   Activity Tolerance Patient tolerated treatment well   Patient Left in chair;with call bell/phone within reach;with chair alarm set   Nurse Communication Mobility status;Precautions        Time: 1959-7471 OT Time Calculation (min): 32 min  Charges: OT General Charges $OT Visit: 1 Procedure OT Treatments $Self Care/Home Management : 23-37 mins  [place], OTR/L [place] 02/09/2017, 5:07 PM

## 2017-02-09 NOTE — Progress Notes (Signed)
Physical Therapy Treatment Patient Details Name: Bridget [name] F Poblano MRN: [id number] DOB: 05/29/1961 Today's Date: 02/09/2017    History of Present Illness Pt is a 56 yo female s/p L posterior THA. Pt with PMH: of Bipolar d/c. PSH: R THA 2012.    PT Comments    Pt recalled 3/3 hip precautions, but did need cues to incorporate into mobility for turning.  Con't to recommend HHPT.   Follow Up Recommendations  Home health PT;Supervision/Assistance - 24 hour     Equipment Recommendations  3in1 (PT)    Recommendations for Other Services       Precautions / Restrictions Precautions Precautions: Posterior Hip Precaution Comments: reviewed Required Braces or Orthoses: Other Brace/Splint Other Brace/Splint: abduction pillow when in bed Restrictions Weight Bearing Restrictions: Yes LLE Weight Bearing: Weight bearing as tolerated    Mobility  Bed Mobility               General bed mobility comments: up in recliner upon arrival  Transfers Overall transfer level: Needs assistance Equipment used: Rolling walker (2 wheeled) Transfers: Sit to/from Stand Sit to Stand: Min guard         General transfer comment: min/guard with good technique today  Ambulation/Gait Ambulation/Gait assistance: Min guard;Supervision Ambulation Distance (Feet): 72 Feet Assistive device: Rolling walker (2 wheeled) Gait Pattern/deviations: Step-through pattern;Decreased stride length Gait velocity: decreased   General Gait Details: Improved technqiue with decreased vaulting on R LE.  She did need cues to incorporate hip precautions with turning.   Stairs            Wheelchair Mobility    Modified Rankin (Stroke Patients Only)       Balance Overall balance assessment: Needs assistance         Standing balance support: During functional activity Standing balance-Leahy Scale: Fair Standing balance comment: Pt getting items out of her bag to prepare for her bath.  She got into RW  and was reliant on counter for support.                            Cognition Arousal/Alertness: Awake/alert Behavior During Therapy: WFL for tasks assessed/performed Overall Cognitive Status: Within Functional Limits for tasks assessed                                 General Comments: known h/o bipolar d/c      Exercises Total Joint Exercises Ankle Circles/Pumps: AROM;Both;10 reps;Supine Gluteal Sets: AROM;Both;10 reps Hip ABduction/ADduction: AAROM;Left;5 reps;Seated Long Arc Quad: AAROM;Left;10 reps;Seated    General Comments        Pertinent Vitals/Pain Pain Assessment: Faces Faces Pain Scale: Hurts little more Pain Location: B hips with ambulation Pain Descriptors / Indicators: Sore Pain Intervention(s): Limited activity within patient's tolerance;Monitored during session;Repositioned    Home Living                      Prior Function            PT Goals (current goals can now be found in the care plan section) Acute Rehab PT Goals Patient Stated Goal: home PT Goal Formulation: With patient Time For Goal Achievement: 02/15/17 Potential to Achieve Goals: Good Progress towards PT goals: Progressing toward goals    Frequency    7X/week      PT Plan Current plan remains appropriate    Co-evaluation  AM-PAC PT "6 Clicks" Daily Activity  Outcome Measure  Difficulty turning over in bed (including adjusting bedclothes, sheets and blankets)?: A Little Difficulty moving from lying on back to sitting on the side of the bed? : A Little Difficulty sitting down on and standing up from a chair with arms (e.g., wheelchair, bedside commode, etc,.)?: A Little Help needed moving to and from a bed to chair (including a wheelchair)?: A Little Help needed walking in hospital room?: A Little Help needed climbing 3-5 steps with a railing? : A Little 6 Click Score: 18    End of Session Equipment Utilized During  Treatment: Gait belt Activity Tolerance: Patient tolerated treatment well Patient left: Other (comment) (with NT who was coming in to A with bath) Nurse Communication: Mobility status PT Visit Diagnosis: Pain;Unsteadiness on feet (R26.81);Muscle weakness (generalized) (M62.81) Pain - Right/Left: Left Pain - part of body: Hip     Time: 1610-9604 PT Time Calculation (min) (ACUTE ONLY): 25 min  Charges:  $Gait Training: 8-22 mins $Therapeutic Exercise: 8-22 mins                    G Codes:       Kassie Keng L. Katrinka Blazing, [place] Pager [phone] 02/09/2017    Enzo Montgomery 02/09/2017, 10:20 AM

## 2017-02-22 ENCOUNTER — Telehealth: Payer: Self-pay | Admitting: Family Medicine

## 2017-02-22 NOTE — Telephone Encounter (Signed)
It appears that Dr Dion SaucierLandau prescribed this for DVT prophylaxis after patient's hip surgery.  Will ask CMA to attempt to contact patient again in the morning to remind her that she should be on this medication.  If she does not answer, plan to send a letter.

## 2017-02-22 NOTE — Telephone Encounter (Signed)
Pt has not filled Xarelto Rx. Clydie BraunKaren has not been able to get in contact with pt. ep

## 2017-02-23 NOTE — Telephone Encounter (Signed)
Tried to contact pt to inform her of below and the phone only rang and there was no option to LVM.  If she calls back please inform her of below. Lamonte SakaiZimmerman Rumple, April D, [place]

## 2017-03-03 NOTE — Addendum Note (Signed)
Addendum  created 03/03/17 1444 by Greysen Swanton, MD   Sign clinical note    

## 2017-03-07 ENCOUNTER — Encounter: Payer: Self-pay | Admitting: Family Medicine

## 2017-03-07 ENCOUNTER — Other Ambulatory Visit (HOSPITAL_COMMUNITY)
Admission: RE | Admit: 2017-03-07 | Discharge: 2017-03-07 | Disposition: A | Discharge status: Home / Self Care | Payer: Medicaid Other | Source: Ambulatory Visit | Attending: Family Medicine | Admitting: Family Medicine

## 2017-03-07 ENCOUNTER — Ambulatory Visit (INDEPENDENT_AMBULATORY_CARE_PROVIDER_SITE_OTHER): Payer: Medicaid Other | Admitting: Family Medicine

## 2017-03-07 VITALS — BP 110/70 | HR 87 | Temp 97.9°F | Ht 61.0 in | Wt 223.0 lb

## 2017-03-07 DIAGNOSIS — B9689 Other specified bacterial agents as the cause of diseases classified elsewhere: Secondary | ICD-10-CM

## 2017-03-07 DIAGNOSIS — Z202 Contact with and (suspected) exposure to infections with a predominantly sexual mode of transmission: Secondary | ICD-10-CM

## 2017-03-07 DIAGNOSIS — F411 Generalized anxiety disorder: Secondary | ICD-10-CM | POA: Diagnosis not present

## 2017-03-07 DIAGNOSIS — N760 Acute vaginitis: Secondary | ICD-10-CM

## 2017-03-07 DIAGNOSIS — L304 Erythema intertrigo: Secondary | ICD-10-CM | POA: Diagnosis not present

## 2017-03-07 LAB — POCT WET PREP (WET MOUNT)
CLUE CELLS WET PREP WHIFF POC: POSITIVE
TRICHOMONAS WET PREP HPF POC: ABSENT

## 2017-03-07 MED ORDER — HYDROXYZINE HCL 10 MG PO TABS: 10.0000 mg | ORAL_TABLET | Freq: Three times a day (TID) | ORAL | 0 refills | Status: DC | PRN

## 2017-03-07 MED ORDER — METRONIDAZOLE 500 MG PO TABS: 500.0000 mg | ORAL_TABLET | Freq: Two times a day (BID) | ORAL | 0 refills | Status: AC

## 2017-03-07 MED ORDER — NICOTINE 14 MG/24HR TD PT24: 14.0000 mg | MEDICATED_PATCH | Freq: Every day | TRANSDERMAL | 0 refills | Status: DC

## 2017-03-07 MED ORDER — NYSTATIN 100000 UNIT/GM EX CREA: 1.0000 "application " | TOPICAL_CREAM | Freq: Two times a day (BID) | CUTANEOUS | 0 refills | Status: DC

## 2017-03-07 NOTE — Progress Notes (Signed)
Subjective: CC: rash HPI: Bridget [name] is a 56 y.o. female presenting to clinic today for:  1. Rash  Patient reports she first noticed irritation of the skin under her belly about 3-4 days after she got out of the hospital.  She reports it was initially itchy but then skin started becoming thin and irritated.  She reports that she has been using a topical OTC abx ointment with some improvement but no resolution.  Denies exudate or bleeding.  No fevers, chills, nausea, vomiting.  2. Possible exposure to STD Patient reports she is sexually active with a monogomous partner, who was recently released from prison.  She reports that she has some internal vaginal irritation that she is concerned about.  She denies vaginal discharge, dyspareunia, post coital bleeding, foul vaginal odors.  She does not use protection.  She is unsure if her partner is faithful.  She would like to be tested for all STDs and Hepatitis.  She is post menopausal.  3. Anxiety Patient reports that her nerves have been bad recently.  She notes that she has used Valium in the past with good relief.  Not currently on SSRI.    Social Hx reviewed. MedHx, medications and allergies reviewed.  Please see EMR. ROS: Per HPI  Objective: Office vital signs reviewed. BP 110/70   Pulse 87   Temp 97.9 F (36.6 C) (Oral)   Ht 5\' 1"  (1.549 m)   Wt 223 lb (101.2 kg)   LMP  (LMP Unknown)   SpO2 97%   BMI 42.14 kg/m   Physical Examination:  General: Awake, alert, well nourished, No acute distress HEENT: Normal    Eyes: PERRLA, EOMI, sclera white    Throat: moist mucus membranes, no erythema, no tonsillar exudate.  Airway is patent GU: external vaginal tissue atrophic, cervix not visualized as sterile speculum not used because patient unable to tolerate exam 2/2 recent hip surgery, no vaginal discharge appreciated. No odors appreciated.  No vaginal bleeding, no CMT, no abdominal/ adnexal masses Extremities: warm, well  perfused, No edema.  Left lateral aspect of hip with large surgical incision that appears to be well healing.  No evidence of infection. Skin: macerated, shiny skin under pannus and in inguinal folds.  No induration or exudate from skin.  NO skin breakdown. Psych: mood stable, speech normal  GAD 7 : Generalized Anxiety Score 03/07/2017  Nervous, Anxious, on Edge 1  Control/stop worrying 0  Worry too much - different things 2  Trouble relaxing 3  Restless 1  Easily annoyed or irritable 3  Afraid - awful might happen 0  Total GAD 7 Score 10  Anxiety Difficulty Somewhat difficult   Results for orders placed or performed in visit on 03/07/17 (from the past 24 hour(s))  POCT Wet Prep Mellody Drown(Wet [place])     Status: Abnormal   Collection Time: 03/07/17 11:51 AM  Result Value Ref Range   Source Wet Prep POC VAG    WBC, Wet Prep HPF POC 0-3    Bacteria Wet Prep HPF POC Many (A) Few   Clue Cells Wet Prep HPF POC Moderate (A) None   Clue Cells Wet Prep Whiff POC Positive Whiff    Yeast Wet Prep HPF POC None    Trichomonas Wet Prep HPF POC Absent Absent   Assessment/ Plan: 56 y.o. female   Anxiety state GAD7 score 10.  Her anxiety seems to be a normal response to stress related to recent hip surgery.  Will give  Atarax 10mg  q8 prn.  If persistent anxiety, will consider initiating SSRI.  Would avoid benzos.  Follow up in 1 month or sooner if needed  Intertrigo - Home care instructions reviewed - Advised to keep inguinal folds and area under pannus dry. - nystatin cream (MYCOSTATIN); Apply 1 application topically 2 (two) times daily. x2 weeks  Dispense: 60 g; Refill: 0 - return if no improvement.  Possible exposure to STD - POCT Wet Prep (Wet [place]) - HIV antibody - RPR - Hepatitis C antibody - Cervicovaginal ancillary only  Bacterial vaginosis.  Wet prep with Positive whiff, moderate clue cells and many bacteria. - Reviewed wet prep results with patient via phone. - metroNIDAZOLE (FLAGYL)  500 MG tablet; Take 1 tablet (500 mg total) by mouth 2 (two) times daily.  Dispense: 14 tablet; Refill: 0 - Cautioned disulfiram reaction.   Raliegh Ip, DO PGY-3, [facility]

## 2017-03-07 NOTE — Assessment & Plan Note (Addendum)
GAD7 score 10.  Her anxiety seems to be a normal response to stress related to recent hip surgery.  Will give Atarax 10mg  q8 prn.  If persistent anxiety, could consider initiating SSRI but has h/o Bipolar disorder so would be cautious.  Would avoid benzos.  Follow up in 1 month or sooner if needed

## 2017-03-07 NOTE — Patient Instructions (Addendum)
I will contact you will the results of your labs.  If anything is abnormal, I will call you.  Otherwise, expect a copy to be mailed to you.   Intertrigo Intertrigo is skin irritation or inflammation (dermatitis) that occurs when folds of skin rub together. The irritation can cause a rash and make skin raw and itchy. This condition most commonly occurs in the skin folds of these areas:  Toes.  Armpits.  Groin.  Belly.  Breasts.  Buttocks.  Intertrigo is not passed from person to person (is not contagious). What are the causes? This condition is caused by heat, moisture, friction, and lack of air circulation. The condition can be made worse by:  Sweat.  Bacteria or a fungus, such as yeast.  What increases the risk? This condition is more likely to occur if you have moisture in your skin folds. It is also more likely to develop in people who:  Have diabetes.  Are overweight.  Are on bed rest.  Live in a warm and moist climate.  Wear splints, braces, or other medical devices.  Are not able to control their bowels or bladder (have incontinence).  What are the signs or symptoms? Symptoms of this condition include:  A pink or red skin rash.  Brown patches on the skin.  Raw or scaly skin.  Itchiness.  A burning feeling.  Bleeding.  Leaking fluid.  A bad smell.  How is this diagnosed? This condition is diagnosed with a medical history and physical exam. You may also have a skin swab to test for bacteria or a fungus, such as yeast. How is this treated? Treatment may include:  Cleaning and drying your skin.  An oral antibiotic medicine or antibiotic skin cream for a bacterial infection.  Antifungal cream or pills for an infection that was caused by a fungus, such as yeast.  Steroid ointment to relieve itchiness and irritation.  Follow these instructions at home:  Keep the affected area clean and dry.  Do not scratch your skin.  Stay in a cool  environment as much as possible. Use an air conditioner or fan, if available.  Apply over-the-counter and prescription medicines only as told by your health care provider.  If you were prescribed an antibiotic medicine, use it as told by your health care provider. Do not stop using the antibiotic even if your condition improves.  Keep all follow-up visits as told by your health care provider. This is important. How is this prevented?  Maintain a healthy weight.  Take care of your feet, especially if you have diabetes. Foot care includes: ? Wearing shoes that fit well. ? Keeping your feet dry. ? Wearing clean, breathable socks.  Protect the skin around your groin and buttocks, especially if you have incontinence. Skin protection includes: ? Following a regular cleaning routine. ? Using moisturizers and skin protectants. ? Changing protection pads frequently.  Do not wear tight clothes. Wear clothes that are loose and absorbent. Wear clothes that are made of cotton.  Wear a bra that gives good support, if needed.  Shower and dry yourself thoroughly after activity. Use a hair dryer on a cool setting to dry between skin folds, especially after you bathe.  If you have diabetes, keep your blood sugar under control. Contact a health care provider if:  Your symptoms do not improve with treatment.  Your symptoms get worse or they spread.  You notice increased redness and warmth.  You have a fever. This information is not  intended to replace advice given to you by your health care provider. Make sure you discuss any questions you have with your health care provider. Document Released: 09/19/2005 Document Revised: 02/25/2016 Document Reviewed: 03/23/2015 Elsevier Interactive Patient Education  2018 [employer].

## 2017-03-08 LAB — RPR: RPR: NONREACTIVE

## 2017-03-08 LAB — HIV ANTIBODY (ROUTINE TESTING W REFLEX): HIV Screen 4th Generation wRfx: NONREACTIVE

## 2017-03-08 LAB — HEPATITIS C ANTIBODY: Hep C Virus Ab: <0.1 s/co ratio (ref 0.0–0.9)

## 2017-03-08 NOTE — Telephone Encounter (Signed)
Recent visit with MD. Milas GainFleeger, Maryjo RochesterJessica Dawn, CMA

## 2017-03-09 LAB — CERVICOVAGINAL ANCILLARY ONLY
CHLAMYDIA, DNA PROBE: NEGATIVE
Neisseria Gonorrhea: NEGATIVE

## 2017-03-13 ENCOUNTER — Encounter: Payer: Self-pay | Admitting: Family Medicine

## 2017-08-14 ENCOUNTER — Other Ambulatory Visit: Payer: Self-pay

## 2017-08-14 ENCOUNTER — Encounter (HOSPITAL_COMMUNITY): Payer: Self-pay | Admitting: Emergency Medicine

## 2017-08-14 ENCOUNTER — Observation Stay (HOSPITAL_COMMUNITY)
Admission: EM | Admit: 2017-08-14 | Discharge: 2017-08-15 | Disposition: A | Discharge status: Home / Self Care | Payer: Medicaid Other | Attending: Family Medicine | Admitting: Family Medicine

## 2017-08-14 ENCOUNTER — Emergency Department (HOSPITAL_COMMUNITY): Payer: Medicaid Other

## 2017-08-14 DIAGNOSIS — F259 Schizoaffective disorder, unspecified: Secondary | ICD-10-CM | POA: Diagnosis not present

## 2017-08-14 DIAGNOSIS — F419 Anxiety disorder, unspecified: Secondary | ICD-10-CM | POA: Insufficient documentation

## 2017-08-14 DIAGNOSIS — Z96642 Presence of left artificial hip joint: Secondary | ICD-10-CM | POA: Diagnosis not present

## 2017-08-14 DIAGNOSIS — E785 Hyperlipidemia, unspecified: Secondary | ICD-10-CM | POA: Diagnosis not present

## 2017-08-14 DIAGNOSIS — Z8261 Family history of arthritis: Secondary | ICD-10-CM | POA: Diagnosis not present

## 2017-08-14 DIAGNOSIS — Z888 Allergy status to other drugs, medicaments and biological substances status: Secondary | ICD-10-CM | POA: Insufficient documentation

## 2017-08-14 DIAGNOSIS — A419 Sepsis, unspecified organism: Secondary | ICD-10-CM | POA: Diagnosis not present

## 2017-08-14 DIAGNOSIS — Z833 Family history of diabetes mellitus: Secondary | ICD-10-CM | POA: Diagnosis not present

## 2017-08-14 DIAGNOSIS — G8929 Other chronic pain: Secondary | ICD-10-CM | POA: Insufficient documentation

## 2017-08-14 DIAGNOSIS — Z7901 Long term (current) use of anticoagulants: Secondary | ICD-10-CM | POA: Insufficient documentation

## 2017-08-14 DIAGNOSIS — J181 Lobar pneumonia, unspecified organism: Secondary | ICD-10-CM | POA: Diagnosis not present

## 2017-08-14 DIAGNOSIS — J189 Pneumonia, unspecified organism: Secondary | ICD-10-CM

## 2017-08-14 DIAGNOSIS — F308 Other manic episodes: Secondary | ICD-10-CM | POA: Diagnosis not present

## 2017-08-14 DIAGNOSIS — Z8249 Family history of ischemic heart disease and other diseases of the circulatory system: Secondary | ICD-10-CM | POA: Insufficient documentation

## 2017-08-14 DIAGNOSIS — F17210 Nicotine dependence, cigarettes, uncomplicated: Secondary | ICD-10-CM | POA: Insufficient documentation

## 2017-08-14 DIAGNOSIS — M1612 Unilateral primary osteoarthritis, left hip: Secondary | ICD-10-CM | POA: Insufficient documentation

## 2017-08-14 DIAGNOSIS — Z818 Family history of other mental and behavioral disorders: Secondary | ICD-10-CM | POA: Insufficient documentation

## 2017-08-14 DIAGNOSIS — Z79899 Other long term (current) drug therapy: Secondary | ICD-10-CM | POA: Insufficient documentation

## 2017-08-14 DIAGNOSIS — G894 Chronic pain syndrome: Secondary | ICD-10-CM | POA: Diagnosis not present

## 2017-08-14 DIAGNOSIS — M797 Fibromyalgia: Secondary | ICD-10-CM | POA: Diagnosis not present

## 2017-08-14 DIAGNOSIS — Z6834 Body mass index (BMI) 34.0-34.9, adult: Secondary | ICD-10-CM | POA: Insufficient documentation

## 2017-08-14 DIAGNOSIS — R55 Syncope and collapse: Secondary | ICD-10-CM | POA: Insufficient documentation

## 2017-08-14 DIAGNOSIS — Z809 Family history of malignant neoplasm, unspecified: Secondary | ICD-10-CM | POA: Insufficient documentation

## 2017-08-14 DIAGNOSIS — F39 Unspecified mood [affective] disorder: Secondary | ICD-10-CM

## 2017-08-14 DIAGNOSIS — K047 Periapical abscess without sinus: Secondary | ICD-10-CM | POA: Insufficient documentation

## 2017-08-14 HISTORY — DX: Pneumonia, unspecified organism: J18.9

## 2017-08-14 LAB — BASIC METABOLIC PANEL
Anion gap: 10 (ref 5–15)
BUN: 5 mg/dL — ABNORMAL LOW (ref 6–20)
CO2: 23 mmol/L (ref 22–32)
Calcium: 8.3 mg/dL — ABNORMAL LOW (ref 8.9–10.3)
Chloride: 95 mmol/L — ABNORMAL LOW (ref 101–111)
Creatinine, Ser: 0.77 mg/dL (ref 0.44–1.00)
GFR calc Af Amer: >60 mL/min (ref >60)
GFR calc non Af Amer: >60 mL/min (ref >60)
Glucose, Bld: 124 mg/dL — ABNORMAL HIGH (ref 65–99)
Potassium: 3.2 mmol/L — ABNORMAL LOW (ref 3.5–5.1)
Sodium: 128 mmol/L — ABNORMAL LOW (ref 135–145)

## 2017-08-14 LAB — CBC WITH DIFFERENTIAL/PLATELET
Basophils Absolute: 0 10*3/uL (ref 0.0–0.1)
Basophils Relative: 0 %
Eosinophils Absolute: 0 10*3/uL (ref 0.0–0.7)
Eosinophils Relative: 0 %
HCT: 34 % — ABNORMAL LOW (ref 36.0–46.0)
Hemoglobin: 11.4 g/dL — ABNORMAL LOW (ref 12.0–15.0)
Lymphocytes Relative: 13 %
Lymphs Abs: 1.5 10*3/uL (ref 0.7–4.0)
MCH: 27.6 pg (ref 26.0–34.0)
MCHC: 33.5 g/dL (ref 30.0–36.0)
MCV: 82.3 fL (ref 78.0–100.0)
Monocytes Absolute: 0.7 10*3/uL (ref 0.1–1.0)
Monocytes Relative: 6 %
Neutro Abs: 9.1 10*3/uL — ABNORMAL HIGH (ref 1.7–7.7)
Neutrophils Relative %: 81 %
Platelets: 213 10*3/uL (ref 150–400)
RBC: 4.13 MIL/uL (ref 3.87–5.11)
RDW: 14.6 % (ref 11.5–15.5)
WBC: 11.3 10*3/uL — ABNORMAL HIGH (ref 4.0–10.5)

## 2017-08-14 LAB — URINALYSIS, ROUTINE W REFLEX MICROSCOPIC
Bilirubin Urine: NEGATIVE
Glucose, UA: NEGATIVE mg/dL
Ketones, ur: NEGATIVE mg/dL
Leukocytes, UA: NEGATIVE
Nitrite: NEGATIVE
Protein, ur: 100 mg/dL — AB
Specific Gravity, Urine: 1.015 (ref 1.005–1.030)
pH: 6 (ref 5.0–8.0)

## 2017-08-14 LAB — I-STAT CG4 LACTIC ACID, ED: Lactic Acid, Venous: 1.46 mmol/L (ref 0.5–1.9)

## 2017-08-14 LAB — INFLUENZA PANEL BY PCR (TYPE A & B)
INFLBPCR: NEGATIVE
Influenza A By PCR: NEGATIVE

## 2017-08-14 LAB — D-DIMER, QUANTITATIVE: D-Dimer, Quant: 3.13 ug/mL-FEU — ABNORMAL HIGH (ref 0.00–0.50)

## 2017-08-14 MED ORDER — IBUPROFEN 600 MG PO TABS
600.0000 mg | ORAL_TABLET | Freq: Three times a day (TID) | ORAL | Status: DC | PRN
  Administered 2017-08-15: 600 mg via ORAL
  Filled 2017-08-14: qty 1

## 2017-08-14 MED ORDER — IOPAMIDOL (ISOVUE-370) INJECTION 76%
INTRAVENOUS | Status: AC
  Administered 2017-08-14: 100 mL
  Filled 2017-08-14: qty 100

## 2017-08-14 MED ORDER — ONDANSETRON HCL 4 MG PO TABS
4.0000 mg | ORAL_TABLET | Freq: Three times a day (TID) | ORAL | Status: DC | PRN
  Administered 2017-08-14: 4 mg via ORAL
  Filled 2017-08-14: qty 1

## 2017-08-14 MED ORDER — SENNOSIDES-DOCUSATE SODIUM 8.6-50 MG PO TABS
2.0000 | ORAL_TABLET | Freq: Every day | ORAL | Status: DC
  Filled 2017-08-14 (×2): qty 2

## 2017-08-14 MED ORDER — POTASSIUM CHLORIDE CRYS ER 20 MEQ PO TBCR
40.0000 meq | EXTENDED_RELEASE_TABLET | Freq: Once | ORAL | Status: AC
  Administered 2017-08-14: 40 meq via ORAL
  Filled 2017-08-14: qty 2

## 2017-08-14 MED ORDER — ENOXAPARIN SODIUM 40 MG/0.4ML ~~LOC~~ SOLN
40.0000 mg | SUBCUTANEOUS | Status: DC
  Filled 2017-08-14: qty 0.4

## 2017-08-14 MED ORDER — OLANZAPINE 2.5 MG PO TABS
2.5000 mg | ORAL_TABLET | Freq: Every day | ORAL | Status: DC
  Administered 2017-08-14: 2.5 mg via ORAL
  Filled 2017-08-14: qty 1

## 2017-08-14 MED ORDER — HYDROCODONE-ACETAMINOPHEN 7.5-325 MG PO TABS
1.0000 | ORAL_TABLET | Freq: Three times a day (TID) | ORAL | Status: DC | PRN
  Administered 2017-08-14 – 2017-08-15 (×2): 1 via ORAL
  Filled 2017-08-14 (×3): qty 1

## 2017-08-14 MED ORDER — DEXTROSE 5 % IV SOLN
500.0000 mg | Freq: Once | INTRAVENOUS | Status: AC
  Administered 2017-08-14: 500 mg via INTRAVENOUS
  Filled 2017-08-14 (×2): qty 500

## 2017-08-14 MED ORDER — DEXTROSE 5 % IV SOLN
2.0000 g | Freq: Once | INTRAVENOUS | Status: AC
  Administered 2017-08-14: 2 g via INTRAVENOUS
  Filled 2017-08-14: qty 2

## 2017-08-14 MED ORDER — PENICILLIN V POTASSIUM 250 MG PO TABS
500.0000 mg | ORAL_TABLET | Freq: Four times a day (QID) | ORAL | Status: DC
  Administered 2017-08-14 – 2017-08-15 (×3): 500 mg via ORAL
  Filled 2017-08-14 (×6): qty 2

## 2017-08-14 MED ORDER — IBUPROFEN 800 MG PO TABS
800.0000 mg | ORAL_TABLET | Freq: Once | ORAL | Status: AC
  Administered 2017-08-14: 800 mg via ORAL
  Filled 2017-08-14: qty 1

## 2017-08-14 MED ORDER — ACETAMINOPHEN 500 MG PO TABS
1000.0000 mg | ORAL_TABLET | Freq: Once | ORAL | Status: AC
  Administered 2017-08-14: 1000 mg via ORAL
  Filled 2017-08-14: qty 2

## 2017-08-14 MED ORDER — METHOCARBAMOL 500 MG PO TABS: 500.0000 mg | ORAL_TABLET | Freq: Two times a day (BID) | ORAL | Status: DC | PRN

## 2017-08-14 MED ORDER — HYDROXYZINE HCL 10 MG PO TABS
10.0000 mg | ORAL_TABLET | Freq: Three times a day (TID) | ORAL | Status: DC | PRN
  Administered 2017-08-14: 10 mg via ORAL
  Filled 2017-08-14 (×2): qty 1

## 2017-08-14 MED ORDER — SODIUM CHLORIDE 0.9 % IV BOLUS (SEPSIS)
1000.0000 mL | Freq: Once | INTRAVENOUS | Status: AC
  Administered 2017-08-14: 1000 mL via INTRAVENOUS

## 2017-08-14 NOTE — ED Notes (Signed)
Pt taken to CT.

## 2017-08-14 NOTE — H&P (Signed)
[facility] History and Physical Service Pager: [phone]  Patient name: Bridget [name] Medical record number: [id number] Date of birth: 10/16/1960 Age: 56 y.o. Gender: female  Primary Care Provider: Marthenia RollingBland, Scott, DO Consultants: None Code Status: full code  Chief Complaint: SOB and "passing out"  Assessment and Plan: Bridget [name] is a 56 y.o. female presenting with 6days SOB and a potential LOC x1. PMH is significant for HTN, depression, L hip surgery, and diagnosis of schizoaffective/bipolar.  Pnuemonia/SOB-Pnuemonia is R mulitfocal on CXR. Patient describes 6day symptoms of SOB with subjective fever/chills and productive cough with some streaking hemoptosis. D-dimer elevated at 3.13, but CTA was neg for PE.  ACS less likely given lack of chest pain complaint.  Considered TB given febrile symptoms and patients claim of weight loss over last month but low suspicion given claim of intentional diet and lack of sick contacts/travel. CURB 65=0. Maintaining O2 sats on RA.  -Admit to obs, attending Dr. Jennette KettleNeal -flu test -s/p azithromycin 500 mg IV and ceftriaxone 2g IV in ED -O2 as needed -azithromycin for CAP + beta lactam (already on penicillin v for upcoming dental work) -cbc/bmp in AM  Potential LOC-Patient not actually sure if she had LOC or just got lightheaded.  Described as while she was on commode so likely vasovagal in combination with potential dehydration of loss of appetite during this illness.   Have considered hypoglycemia but patient was mid 100s on admission and not on any DM medications indicative of hypoglycemia risk.  Considered arhythmia but ECG was NSR and unchanged from prior. -treat pneumonia as above. -orthostatic vital signs  BH- On atarax inconsistently for anxiety.   Some record of past diagnosis of schizoaffective and bipolar in records but not on medication for that.   Patient was very anxious and almost jumpy, hard to  direct in conversation.  Appears to be manic. Non-compliant on meds -atarax at home dose -[facility]olanzapine low dose to start, unsure if patient will continue (zyprexa 2.5 mg QHS)  Chronic pain - [facility] - continue home norco 7.5-325 mg TID prn; robaxin 500 mg BID prn  Dental infection - on penicillin 500 mg qid x 30 days per records prior to planned tooth extractions - continue penicillin  Hyponatremia - Na 128 on admission. - Recheck on BMP in am  Hematuria: Ua with moderate hemoglobin on dipstick but negative nitrites and leukocytes. Patient endorses increased urinary frequency but has also increased her intake of water.  - s/p ceftriaxone in the ED - urine culture pending - Plan to repeat UA at follow-up   FEN/GI: regular diet/ zofran prn Prophylaxis: lovenox  Disposition: to home after obs  History of Present Illness:  Bridget [name] is a 56 y.o. female presenting with Cough and shortness of breath since Thursday with one episode reported as potential LOC while on toilet.  Slept all of Thursday and declined to take friend to hospital and barely even ate.  Felt headache and worse with leaning forward.  Did take a couple anti-nausea pills at home.  Sweating and craving ice water. What made her really come in was passing out in the bathroom last night.  When pressed for further details she was on commode and isn't sure about LOC and she might have just been lightheaded.  Asked for a gatorade from her granddaughter and by time she got back with an orange juice says was told by  granddaughter that she looked pale.  Loose stool today, feels bladder is full but only empties bladder a little  Had fever to 102 F in ED.  Not taking ibuprofen or tylenol PM.  Weight has been up to 235, lost 9 lbs in month and a half and has changed diet (less sugar).  Penicillin for 30 days because of dental work needed.  Haven't rested since earlier this  fall -- taking care of neighbor and bringing her back and forth to a hospital to care for her brother who just died of cancer.   Feels that she has been taking care of everyone but herself.  Only taking hydroxyzine "when she needs it", last dose yesterday. Denies taking any psychiatric medications.   Says in a state of healing after L hip replacement in the spring.  Otherwise had been feeling well with new mobility after hip surgery.  Has been self-weaning off of pain meds and ibuprofen.  Last took norco 7.5 mg yesterday.  Was on xarelto when had surgery but no longer.    Has 9265yr old albuterol inhaler with no knowledge of for what diagnosis.  She Smoke 6-8 cigarretes per day but does Not want nicotine patch  Review Of Systems: Per HPI with the following additions:   Review of Systems  Constitutional: Positive for chills, diaphoresis and fever.  Eyes: Negative for double vision and pain.  Respiratory: Positive for cough, hemoptysis, sputum production and shortness of breath.   Cardiovascular: Negative for chest pain.  Gastrointestinal: Positive for diarrhea ("just a little bit") and nausea. Negative for vomiting.  Genitourinary: Positive for urgency. Negative for dysuria.  Skin: Negative for rash.  Neurological: Positive for headaches. Negative for sensory change.    Patient Active Problem List   Diagnosis Date Noted  . Pneumonia 08/14/2017  . Anxiety state 03/07/2017  . Primary localized osteoarthritis of left hip 02/07/2017  . Elevated blood pressure reading 01/12/2017  . Cough 03/16/2016  . Fibromyalgia 05/15/2014  . Colonoscopy refused 05/15/2014  . Screening for breast cancer 05/15/2014  . Schizoaffective disorder (HCC) 12/13/2012  . Other and unspecified hyperlipidemia 12/13/2012  . Plantar fasciitis 04/16/2012  . Primary osteoarthritis of right hip 08/15/2011  . Obesity, morbid (HCC)   . Bipolar 2 disorder (HCC) 06/15/2011  . TOBACCO DEPENDENCE 11/30/2006  . OSTEOARTHRITIS  OF SPINE, NOS 11/30/2006  . INCONTINENCE, URGE 11/30/2006    Past Medical History: Past Medical History:  Diagnosis Date  . Bipolar II disorder (HCC)   . Depression   . Obesity, morbid (HCC)   . Osteoarthritis     Past Surgical History: Past Surgical History:  Procedure Laterality Date  . BONE SPURS     REMOVED FROM SHOULDERS  . EYE SURGERY     STY REMOVED LEFT EYE  1992  . ROTATOR CUFF REPAIR     BILATERAL  . TOTAL HIP ARTHROPLASTY Left 02/07/2017    Social History: Social History   Tobacco Use  . Smoking status: Current Every Day Smoker    Packs/day: 0.50    Years: 20.00    Pack years: 10.00    Types: Cigarettes  . Smokeless tobacco: Former NeurosurgeonUser    Quit date: 02/06/2017  Substance Use Topics  . Alcohol use: No  . Drug use: Yes    Types: Marijuana   Additional social history: granddaugther and son and daughter; 6 sigarettes a day, rarely drinks, marijuana  Please also refer to relevant sections of EMR.  Family History: Family History  Problem  Relation Age of Onset  . Bipolar disorder Sister   . Hypertension Sister   . Hypertension Brother   . Diabetes Brother   . Heart disease Brother   . Hypertension Other   . Hypertension Mother   . Rheum arthritis Mother   . Heart disease Mother   . Hypertension Maternal Aunt   . Cancer Father   . Asthma Daughter   . Asthma Son     Allergies and Medications: Allergies  Allergen Reactions  . Lyrica [Pregabalin] Shortness Of Breath    ?allergy  . Neurontin [Gabapentin] Other (See Comments)    Lightheaded, see spots  . Tramadol     Makes feet tingle   No current facility-administered medications on file prior to encounter.    Current Outpatient Medications on File Prior to Encounter  Medication Sig Dispense Refill  . HYDROcodone-acetaminophen (NORCO) 7.5-325 MG tablet Take 1 tablet 3 (three) times daily as needed by mouth for pain.  0  . hydrOXYzine (ATARAX/VISTARIL) 10 MG tablet Take 1 tablet (10 mg total)  by mouth 3 (three) times daily as needed for anxiety. 30 tablet 0  . ibuprofen (ADVIL,MOTRIN) 600 MG tablet Take 600 mg 3 (three) times daily as needed by mouth. pain  0  . methocarbamol (ROBAXIN) 500 MG tablet Take 1 tablet (500 mg total) by mouth 2 (two) times daily as needed. 60 tablet 2  . nystatin cream (MYCOSTATIN) Apply 1 application topically 2 (two) times daily. x2 weeks 60 g 0  . ondansetron (ZOFRAN) 4 MG tablet Take 1 tablet (4 mg total) by mouth every 8 (eight) hours as needed for nausea or vomiting. 30 tablet 0  . penicillin v potassium (VEETID) 500 MG tablet Take 500 mg 4 (four) times daily by mouth. Started on 08-02-17. For 30 days  0  . sennosides-docusate sodium (SENOKOT-S) 8.6-50 MG tablet Take 2 tablets by mouth daily. 30 tablet 1  . nicotine (NICODERM CQ - DOSED IN MG/24 HOURS) 14 mg/24hr patch Place 1 patch (14 mg total) onto the skin daily. (Patient not taking: Reported on 08/14/2017) 28 patch 0  . oxyCODONE-acetaminophen (PERCOCET) 10-325 MG tablet Take 1-2 tablets by mouth every 6 (six) hours as needed for pain. MAXIMUM TOTAL ACETAMINOPHEN DOSE IS 4000 MG PER DAY (Patient not taking: Reported on 08/14/2017) 50 tablet 0  . rivaroxaban (XARELTO) 10 MG TABS tablet Take 1 tablet (10 mg total) by mouth daily. (Patient not taking: Reported on 08/14/2017) 21 tablet 0    Objective: BP 119/79   Pulse 88   Temp 98.5 F (36.9 C) (Oral)   Resp 19   Ht 5\' 5"  (1.651 m)   Wt 207 lb (93.9 kg)   LMP  (LMP Unknown)   SpO2 97%   BMI 34.45 kg/m  Exam: General: anxious and jumpy, NAD Eyes: EOMI, PERRLA ENTM: dry MM, clear nares Cardiovascular: RRR, no murmurs noted Respiratory: CTAB, less airflow on R, no wheezes Gastrointestinal: no TTP, bowel sounds throughout, soft/non-distended MSK: able to ambulate with no concern, no deficits noted Derm: no lesions noted Neuro: grossly intact Psych: alert and able to process discussion, slightly distractable and manic in  presentation  Labs and Imaging: CBC BMET  Recent Labs  Lab 08/14/17 0800  WBC 11.3*  HGB 11.4*  HCT 34.0*  PLT 213   Recent Labs  Lab 08/14/17 0800  NA 128*  K 3.2*  CL 95*  CO2 23  BUN 5*  CREATININE 0.77  GLUCOSE 124*  CALCIUM 8.3*  Dg Chest 2 View  Result Date: 08/14/2017 CLINICAL DATA:  Cough and fever 4 days EXAM: CHEST  2 VIEW COMPARISON:  March 20, 2015 FINDINGS: There is extensive airspace consolidation throughout much of the right upper lobe with involvement anteriorly and posteriorly. A small amount of patchy infiltrate is noted in the right middle lobe. There is linear atelectasis in the left mid lung. Left lung otherwise is clear. Heart size and pulmonary vascularity are normal. No adenopathy. There is degenerative change in the thoracic spine. IMPRESSION: Extensive airspace consolidation consistent with pneumonia throughout much of the right upper lobe. More patchy infiltrate noted right middle lobe. There is mild atelectasis left mid lung. Left lung otherwise clear. Cardiac silhouette within normal limits. No evident adenopathy. Followup PA and lateral chest radiographs recommended in 3-4 weeks following trial of antibiotic therapy to ensure resolution and exclude underlying malignancy. Electronically Signed   By: Bretta Bang III M.D.   On: 08/14/2017 08:38   Ct Angio Chest Pe W And/or Wo Contrast  Result Date: 08/14/2017 CLINICAL DATA:  Shortness of breath and syncope EXAM: CT ANGIOGRAPHY CHEST WITH CONTRAST TECHNIQUE: Multidetector CT imaging of the chest was performed using the standard protocol during bolus administration of intravenous contrast. Multiplanar CT image reconstructions and MIPs were obtained to evaluate the vascular anatomy. CONTRAST:  ISOVUE-370 IOPAMIDOL (ISOVUE-370) INJECTION 76% COMPARISON:  Chest radiograph August 14, 2017 FINDINGS: Cardiovascular: There is no demonstrable pulmonary embolus. There is no thoracic aortic aneurysm or  dissection. The visualized great vessels appear normal. There is atherosclerotic calcification in the aorta. Pericardium is not appreciably thickened. Mediastinum/Nodes: Visualized thyroid appears normal. There is no appreciable thoracic adenopathy. There are subcentimeter mediastinal lymph nodes which do not meet size criteria for pathologic significance. No esophageal lesions are evident. Lungs/Pleura: There is extensive airspace consolidation throughout the right upper lobe as well as more patchy infiltrate in the right middle and lower lobes. Consolidation is noted in the medial segment of the right middle lobe. There is mild patchy infiltrate scattered in the posterior segment of the left upper lobe and lingula. There is no appreciable pleural effusion or pleural thickening. Upper Abdomen: Visualized upper abdominal structures appear unremarkable. Musculoskeletal: There is degenerative change throughout much of the thoracic spine. There are no blastic or lytic bone lesions. Review of the MIP images confirms the above findings. IMPRESSION: 1.  No demonstrable pulmonary embolus. 2. Widespread airspace consolidation throughout the right lung with the greatest degree of consolidation in the right upper lobe but with areas of consolidation in the right middle and medial segment right lower lobe. For patchy infiltrate noted on the left in the posterior segment left upper lobe and lingula. Appearance consistent with multifocal pneumonia. 3. Occasional subcentimeter mediastinal lymph nodes without adenopathy by size criteria. 4.  Atherosclerotic calcification of the thoracic aorta. Aortic Atherosclerosis (ICD10-I70.0). Electronically Signed   By: Bretta Bang III M.D.   On: 08/14/2017 12:19    Marthenia Rolling, DO 08/14/2017, 3:41 PM PGY-1, [facility] FPTS Intern pager: [phone], text pages welcome  Upper Level Addendum:  I have seen and evaluated this patient along with Dr. Parke Simmers and  reviewed the above note, making necessary revisions in green.  Casey Burkitt, MD PGY-3,  [facility] 08/14/2017 6:54 PM

## 2017-08-14 NOTE — ED Provider Notes (Signed)
[facility] EMERGENCY DEPARTMENT Provider Note   CSN: [id number] Arrival date & time: 08/14/17  7829     History   Chief Complaint Chief Complaint  Patient presents with  . Shortness of Breath  . Loss of Consciousness    HPI Bridget [name] is a 56 y.o. female.  HPI Patient presents to the emergency department with fever increased urination, cough, shortness of breath over the last 4 days.  The patient states that she started having fevers and feeling very short of breath at rest and during activity.  Patient states that she did not take any medications prior to arrival for her symptoms.  Patient states that nothing seems to make the condition better or worse.  The patient denies chest pain,  headache,blurred vision, neck pain, weakness, numbness, dizziness, anorexia, edema, abdominal pain, nausea,  diarrhea, rash, back pain, dysuria, hematemesis, bloody stool, near syncope, or syncope. Past Medical History:  Diagnosis Date  . Bipolar II disorder (HCC)   . Depression   . Obesity, morbid (HCC)   . Osteoarthritis     Patient Active Problem List   Diagnosis Date Noted  . Anxiety state 03/07/2017  . Primary localized osteoarthritis of left hip 02/07/2017  . Elevated blood pressure reading 01/12/2017  . Cough 03/16/2016  . Fibromyalgia 05/15/2014  . Colonoscopy refused 05/15/2014  . Screening for breast cancer 05/15/2014  . Schizoaffective disorder (HCC) 12/13/2012  . Other and unspecified hyperlipidemia 12/13/2012  . Plantar fasciitis 04/16/2012  . Primary osteoarthritis of right hip 08/15/2011  . Obesity, morbid (HCC)   . Bipolar 2 disorder (HCC) 06/15/2011  . TOBACCO DEPENDENCE 11/30/2006  . OSTEOARTHRITIS OF SPINE, NOS 11/30/2006  . INCONTINENCE, URGE 11/30/2006    Past Surgical History:  Procedure Laterality Date  . BONE SPURS     REMOVED FROM SHOULDERS  . EYE SURGERY     STY REMOVED LEFT EYE  1992  . ROTATOR CUFF REPAIR     BILATERAL  .  TOTAL HIP ARTHROPLASTY Left 02/07/2017    OB History    No data available       Home Medications    Prior to Admission medications   Medication Sig Start Date End Date Taking? Authorizing Provider  HYDROcodone-acetaminophen (NORCO) 7.5-325 MG tablet Take 1 tablet 3 (three) times daily as needed by mouth for pain. 07/25/17  Yes [provider]  hydrOXYzine (ATARAX/VISTARIL) 10 MG tablet Take 1 tablet (10 mg total) by mouth 3 (three) times daily as needed for anxiety. 03/07/17  Yes Delynn Flavin M, DO  ibuprofen (ADVIL,MOTRIN) 600 MG tablet Take 600 mg 3 (three) times daily as needed by mouth. pain 07/25/17  Yes [provider]  methocarbamol (ROBAXIN) 500 MG tablet Take 1 tablet (500 mg total) by mouth 2 (two) times daily as needed. 02/07/17  Yes Teryl Lucy, MD  nystatin cream (MYCOSTATIN) Apply 1 application topically 2 (two) times daily. x2 weeks 03/07/17  Yes Gottschalk, Ashly M, DO  ondansetron (ZOFRAN) 4 MG tablet Take 1 tablet (4 mg total) by mouth every 8 (eight) hours as needed for nausea or vomiting. 02/07/17  Yes Teryl Lucy, MD  penicillin v potassium (VEETID) 500 MG tablet Take 500 mg 4 (four) times daily by mouth. Started on 08-02-17. For 30 days 08/02/17  Yes [provider]  sennosides-docusate sodium (SENOKOT-S) 8.6-50 MG tablet Take 2 tablets by mouth daily. 02/07/17  Yes Teryl Lucy, MD  nicotine (NICODERM CQ - DOSED IN MG/24 HOURS) 14 mg/24hr patch Place  1 patch (14 mg total) onto the skin daily. Patient not taking: Reported on 08/14/2017 03/07/17   Raliegh Ip, DO  oxyCODONE-acetaminophen (PERCOCET) 10-325 MG tablet Take 1-2 tablets by mouth every 6 (six) hours as needed for pain. MAXIMUM TOTAL ACETAMINOPHEN DOSE IS 4000 MG PER DAY Patient not taking: Reported on 08/14/2017 02/07/17   Teryl Lucy, MD  rivaroxaban (XARELTO) 10 MG TABS tablet Take 1 tablet (10 mg total) by mouth daily. Patient not taking: Reported on 08/14/2017 02/07/17    Teryl Lucy, MD    Family History Family History  Problem Relation Age of Onset  . Bipolar disorder Sister   . Hypertension Sister   . Hypertension Brother   . Diabetes Brother   . Heart disease Brother   . Hypertension Other   . Hypertension Mother   . Rheum arthritis Mother   . Heart disease Mother   . Hypertension Maternal Aunt   . Cancer Father   . Asthma Daughter   . Asthma Son     Social History Social History   Tobacco Use  . Smoking status: Current Every Day Smoker    Packs/day: 0.50    Years: 20.00    Pack years: 10.00    Types: Cigarettes  . Smokeless tobacco: Former Neurosurgeon    Quit date: 02/06/2017  Substance Use Topics  . Alcohol use: No  . Drug use: Yes    Types: Marijuana     Allergies   Lyrica [pregabalin]; Neurontin [gabapentin]; and Tramadol   Review of Systems Review of Systems All other systems negative except as documented in the HPI. All pertinent positives and negatives as reviewed in the HPI.  Physical Exam Updated Vital Signs BP 119/79   Pulse 88   Temp 98.5 F (36.9 C) (Oral)   Resp 19   Ht 5\' 5"  (1.651 m)   Wt 93.9 kg (207 lb)   LMP  (LMP Unknown)   SpO2 97%   BMI 34.45 kg/m   Physical Exam  Constitutional: She is oriented to person, place, and time. She appears well-developed and well-nourished. No distress.  HENT:  Head: Normocephalic and atraumatic.  Mouth/Throat: Oropharynx is clear and moist.  Eyes: Pupils are equal, round, and reactive to light.  Neck: Normal range of motion. Neck supple.  Cardiovascular: Normal rate, regular rhythm and normal heart sounds. Exam reveals no gallop and no friction rub.  No murmur heard. Pulmonary/Chest: Effort normal. No stridor. Tachypnea noted. No respiratory distress. She has decreased breath sounds in the right upper field and the right middle field. She has no wheezes. She has no rhonchi. She has no rales.  Abdominal: Soft. Bowel sounds are normal. She exhibits no distension.  There is no tenderness.  Neurological: She is alert and oriented to person, place, and time. She exhibits normal muscle tone. Coordination normal.  Skin: Skin is warm and dry. Capillary refill takes less than 2 seconds. No rash noted. No erythema.  Psychiatric: She has a normal mood and affect. Her behavior is normal.  Nursing note and vitals reviewed.    ED Treatments / Results  Labs (all labs ordered are listed, but only abnormal results are displayed) Labs Reviewed  URINALYSIS, ROUTINE W REFLEX MICROSCOPIC - Abnormal; Notable for the following components:      Result Value   Color, Urine AMBER (*)    APPearance HAZY (*)    Hgb urine dipstick MODERATE (*)    Protein, ur 100 (*)    Bacteria, UA RARE (*)  Squamous Epithelial / LPF 0-5 (*)    All other components within normal limits  BASIC METABOLIC PANEL - Abnormal; Notable for the following components:   Sodium 128 (*)    Potassium 3.2 (*)    Chloride 95 (*)    Glucose, Bld 124 (*)    BUN 5 (*)    Calcium 8.3 (*)    All other components within normal limits  CBC WITH DIFFERENTIAL/PLATELET - Abnormal; Notable for the following components:   WBC 11.3 (*)    Hemoglobin 11.4 (*)    HCT 34.0 (*)    Neutro Abs 9.1 (*)    All other components within normal limits  D-DIMER, QUANTITATIVE (NOT AT [facility]) - Abnormal; Notable for the following components:   D-Dimer, Quant 3.13 (*)    All other components within normal limits  URINE CULTURE  CULTURE, BLOOD (ROUTINE X 2)  CULTURE, BLOOD (ROUTINE X 2)  I-STAT CG4 LACTIC ACID, ED    EKG  EKG Interpretation  Date/Time:  Monday August 14 2017 07:35:21 EST Ventricular Rate:  92 PR Interval:    QRS Duration: 81 QT Interval:  347 QTC Calculation: 430 R Axis:   13 Text Interpretation:  Sinus rhythm Right atrial enlargement Abnormal R-wave progression, early transition since last tracing no significant change Confirmed by Eber HongMiller, Brian ([id number]) on 08/14/2017 7:39:55 AM Also  confirmed by Eber HongMiller, Brian ([id number]), editor Madalyn RobEverhart, Marilyn [phone])  on 08/14/2017 9:11:48 AM       Radiology Dg Chest 2 View  Result Date: 08/14/2017 CLINICAL DATA:  Cough and fever 4 days EXAM: CHEST  2 VIEW COMPARISON:  March 20, 2015 FINDINGS: There is extensive airspace consolidation throughout much of the right upper lobe with involvement anteriorly and posteriorly. A small amount of patchy infiltrate is noted in the right middle lobe. There is linear atelectasis in the left mid lung. Left lung otherwise is clear. Heart size and pulmonary vascularity are normal. No adenopathy. There is degenerative change in the thoracic spine. IMPRESSION: Extensive airspace consolidation consistent with pneumonia throughout much of the right upper lobe. More patchy infiltrate noted right middle lobe. There is mild atelectasis left mid lung. Left lung otherwise clear. Cardiac silhouette within normal limits. No evident adenopathy. Followup PA and lateral chest radiographs recommended in 3-4 weeks following trial of antibiotic therapy to ensure resolution and exclude underlying malignancy. Electronically Signed   By: Bretta BangWilliam  Woodruff III M.D.   On: 08/14/2017 08:38   Ct Angio Chest Pe W And/or Wo Contrast  Result Date: 08/14/2017 CLINICAL DATA:  Shortness of breath and syncope EXAM: CT ANGIOGRAPHY CHEST WITH CONTRAST TECHNIQUE: Multidetector CT imaging of the chest was performed using the standard protocol during bolus administration of intravenous contrast. Multiplanar CT image reconstructions and MIPs were obtained to evaluate the vascular anatomy. CONTRAST:  100mL ISOVUE-370 IOPAMIDOL (ISOVUE-370) INJECTION 76% COMPARISON:  Chest radiograph August 14, 2017 FINDINGS: Cardiovascular: There is no demonstrable pulmonary embolus. There is no thoracic aortic aneurysm or dissection. The visualized great vessels appear normal. There is atherosclerotic calcification in the aorta. Pericardium is not appreciably  thickened. Mediastinum/Nodes: Visualized thyroid appears normal. There is no appreciable thoracic adenopathy. There are subcentimeter mediastinal lymph nodes which do not meet size criteria for pathologic significance. No esophageal lesions are evident. Lungs/Pleura: There is extensive airspace consolidation throughout the right upper lobe as well as more patchy infiltrate in the right middle and lower lobes. Consolidation is noted in the medial segment of the right middle lobe. There is mild  patchy infiltrate scattered in the posterior segment of the left upper lobe and lingula. There is no appreciable pleural effusion or pleural thickening. Upper Abdomen: Visualized upper abdominal structures appear unremarkable. Musculoskeletal: There is degenerative change throughout much of the thoracic spine. There are no blastic or lytic bone lesions. Review of the MIP images confirms the above findings. IMPRESSION: 1.  No demonstrable pulmonary embolus. 2. Widespread airspace consolidation throughout the right lung with the greatest degree of consolidation in the right upper lobe but with areas of consolidation in the right middle and medial segment right lower lobe. For patchy infiltrate noted on the left in the posterior segment left upper lobe and lingula. Appearance consistent with multifocal pneumonia. 3. Occasional subcentimeter mediastinal lymph nodes without adenopathy by size criteria. 4.  Atherosclerotic calcification of the thoracic aorta. Aortic Atherosclerosis (ICD10-I70.0). Electronically Signed   By: Bretta BangWilliam  Woodruff III M.D.   On: 08/14/2017 12:19    Procedures Procedures (including critical care time)  Medications Ordered in ED Medications  sodium chloride 0.9 % bolus 1,000 mL (0 mLs Intravenous Stopped 08/14/17 1220)  acetaminophen (TYLENOL) tablet 1,000 mg (1,000 mg Oral Given 08/14/17 0841)  ibuprofen (ADVIL,MOTRIN) tablet 800 mg (800 mg Oral Given 08/14/17 0841)  cefTRIAXone (ROCEPHIN) 2 g in  dextrose 5 % 50 mL IVPB (0 g Intravenous Stopped 08/14/17 1040)  azithromycin (ZITHROMAX) 500 mg in dextrose 5 % 250 mL IVPB (0 mg Intravenous Stopped 08/14/17 1143)  iopamidol (ISOVUE-370) 76 % injection (100 mLs  Contrast Given 08/14/17 1202)     Initial Impression / Assessment and Plan / ED Course  I have reviewed the triage vital signs and the nursing notes.  Pertinent labs & imaging results that were available during my care of the patient were reviewed by me and considered in my medical decision making (see chart for details).     The patient will need admission to the hospital due to the extensive nature of the pneumonia along with her fever tachypnea mild tachycardia and blood pressures that are somewhat low but not significantly hypotensive.  The patient has been otherwise stable.  Patient had a negative lactic acid.  She did meet Sirs criteria.   Final Clinical Impressions(s) / ED Diagnoses   Final diagnoses:  Community acquired pneumonia of right upper lobe of lung [facility](HCC)    ED Discharge Orders    None       Charlestine NightLawyer, Eugune Sine, PA-C 08/14/17 1524    Eber HongMiller, Brian, MD 08/15/17 1021

## 2017-08-14 NOTE — ED Triage Notes (Signed)
Pt states she had a syncopal episode last night, fell to floor- did not hit her head.- felt it coming on. Pt also states she feels SOB, this is not new, but feels that it has worsened. Denies CP.

## 2017-08-14 NOTE — ED Notes (Signed)
Patient given a [place] sandwich and ice to drink her gatorade.

## 2017-08-14 NOTE — ED Notes (Signed)
Hope RN notified @ [phone] that code sepsis was called via [facility].

## 2017-08-14 NOTE — ED Notes (Signed)
Pt ambulated to the restroom independently 

## 2017-08-14 NOTE — ED Provider Notes (Signed)
The patient is a 56 year old female, she endorses tobacco use, she states that she has had some progressive shortness of breath coughing and is now become very sweaty.  There is a question as to whether she passed out but she is not very forthright in her information.  On exam the patient is diaphoretic, she has some decreased lung sounds on the right, lung sounds are normal on the left, she coughs intermittently and has no edema of the lower extremities.  Labs reviewed showing a slight leukocytosis, she has a pulse of 95, she is febrile to 102, she has tachypnea to 20-24 breaths/min.  X-ray shows right sided large infiltrate with possible multifocal infiltrates as well.  Checking other labs, the patient will likely need to be admitted, code sepsis was activated.  The patient does not appear to be in shock at this time.  CT confirms that the pt has widespread R lung involvement of pneumonia / infiltrate -   The pt is septic with PNA, critical care services were rendered.  CRITICAL CARE Performed by: Vida RollerBrian D Geron Mulford Total critical care time: 35 minutes Critical care time was exclusive of separately billable procedures and treating other patients. Critical care was necessary to treat or prevent imminent or life-threatening deterioration. Critical care was time spent personally by me on the following activities: development of treatment plan with patient and/or surrogate as well as nursing, discussions with consultants, evaluation of patient's response to treatment, examination of patient, obtaining history from patient or surrogate, ordering and performing treatments and interventions, ordering and review of laboratory studies, ordering and review of radiographic studies, pulse oximetry and re-evaluation of patient's condition.   Medical screening examination/treatment/procedure(s) were conducted as a shared visit with non-physician practitioner(s) and myself.  I personally evaluated the patient  during the encounter.  Clinical Impression:   Final diagnoses:  Community acquired pneumonia of right upper lobe of lung (HCC)  Sepsis, due to unspecified organism [facility](HCC)         Eber HongMiller, Omere Marti, MD 08/15/17 1022

## 2017-08-15 LAB — CBC
HEMATOCRIT: 32.5 % — AB (ref 36.0–46.0)
Hemoglobin: 10.6 g/dL — ABNORMAL LOW (ref 12.0–15.0)
MCH: 27 pg (ref 26.0–34.0)
MCHC: 32.6 g/dL (ref 30.0–36.0)
MCV: 82.9 fL (ref 78.0–100.0)
PLATELETS: 232 10*3/uL (ref 150–400)
RBC: 3.92 MIL/uL (ref 3.87–5.11)
RDW: 15.3 % (ref 11.5–15.5)
WBC: 9.2 10*3/uL (ref 4.0–10.5)

## 2017-08-15 LAB — BASIC METABOLIC PANEL
Anion gap: 10 (ref 5–15)
CHLORIDE: 101 mmol/L (ref 101–111)
CO2: 23 mmol/L (ref 22–32)
CREATININE: 0.68 mg/dL (ref 0.44–1.00)
Calcium: 8.6 mg/dL — ABNORMAL LOW (ref 8.9–10.3)
GFR calc non Af Amer: >60 mL/min (ref >60)
GLUCOSE: 113 mg/dL — AB (ref 65–99)
POTASSIUM: 3.1 mmol/L — AB (ref 3.5–5.1)
SODIUM: 134 mmol/L — AB (ref 135–145)

## 2017-08-15 LAB — URINE CULTURE

## 2017-08-15 MED ORDER — HYDROXYZINE HCL 10 MG PO TABS: 10.0000 mg | ORAL_TABLET | Freq: Three times a day (TID) | ORAL | 0 refills | Status: DC | PRN

## 2017-08-15 MED ORDER — POTASSIUM CHLORIDE CRYS ER 20 MEQ PO TBCR
40.0000 meq | EXTENDED_RELEASE_TABLET | ORAL | Status: AC
  Administered 2017-08-15 (×2): 40 meq via ORAL
  Filled 2017-08-15 (×2): qty 2

## 2017-08-15 MED ORDER — OLANZAPINE 2.5 MG PO TABS: 2.5000 mg | ORAL_TABLET | Freq: Every day | ORAL | 0 refills | Status: DC

## 2017-08-15 MED ORDER — LEVOFLOXACIN 750 MG PO TABS: 750.0000 mg | ORAL_TABLET | Freq: Every day | ORAL | 0 refills | Status: AC

## 2017-08-15 NOTE — Discharge Summary (Signed)
Family Medicine Teaching [facility] Discharge Summary  Patient name: Bridget [name] Medical record number: [id number] Date of birth: 1961/02/13 Age: 56 y.o. Gender: female Date of Admission: 08/14/2017  Date of Discharge: 08/15/17 Admitting Physician: Nestor Ramp, MD  Primary Care Provider: Marthenia Rolling, DO Consultants:   Indication for Hospitalization: SOB w/ questionable LOC x1  Discharge Diagnoses/Problem List:  Pneumonia Anxiety Hypomania Dental infection Chronic pain  Disposition: to home  Discharge Condition: stable  Discharge Exam: General:anxious and jumpy, NAD, walking around room without difficulty Eyes:EOMI, PERRLA ENTM:MMM, clear nares Cardiovascular:RRR, no murmurs noted Respiratory:CTAB, less airflow on R, no wheezes Gastrointestinal:no TTP, bowel sounds throughout, soft/non-distended WUJ:WJXB to ambulate with no concern, no deficits noted Derm:no lesions noted Neuro:grossly intact Psych:alert and able to process discussion, slightly distractable and manic in presentation  Brief Hospital Course:  Patient admitted for SOB on evening of 11/12, was satting well on RA but with concerning CXR she was brought in for obs.   Patient was feeling well the next morning and demanding d/c.  We discussed close f/u in clinic and continuing abx for her penumonia and she agreed to plan.   She was also hypomanic in presentation and agreed that her thoughts had been racing, she agreed to start olanzapine.  Issues for Follow Up:  1. Patient needs repeat CXR in late December 2018 and repeat chest CT in mid February 2019.   There is concern this pneumonia could potentially be postobstructive due to mass. 2. Patient started on olanzapine for hypomania.   Please discuss her progress on this med and remind her is may take a few months to feel full effect.  Significant Procedures:   Significant Labs and Imaging:  Recent Labs  Lab 08/14/17 0800 08/15/17 0454  WBC  11.3* 9.2  HGB 11.4* 10.6*  HCT 34.0* 32.5*  PLT 213 232   Recent Labs  Lab 08/14/17 0800 08/15/17 0454  NA 128* 134*  K 3.2* 3.1*  CL 95* 101  CO2 23 23  GLUCOSE 124* 113*  BUN 5* <5*  CREATININE 0.77 0.68  CALCIUM 8.3* 8.6*    Dg Chest 2 View  Result Date: 08/14/2017 CLINICAL DATA:  Cough and fever 4 days EXAM: CHEST  2 VIEW COMPARISON:  March 20, 2015 FINDINGS: There is extensive airspace consolidation throughout much of the right upper lobe with involvement anteriorly and posteriorly. A small amount of patchy infiltrate is noted in the right middle lobe. There is linear atelectasis in the left mid lung. Left lung otherwise is clear. Heart size and pulmonary vascularity are normal. No adenopathy. There is degenerative change in the thoracic spine. IMPRESSION: Extensive airspace consolidation consistent with pneumonia throughout much of the right upper lobe. More patchy infiltrate noted right middle lobe. There is mild atelectasis left mid lung. Left lung otherwise clear. Cardiac silhouette within normal limits. No evident adenopathy. Followup PA and lateral chest radiographs recommended in 3-4 weeks following trial of antibiotic therapy to ensure resolution and exclude underlying malignancy. Electronically Signed   By: Bretta Bang III M.D.   On: 08/14/2017 08:38   Ct Angio Chest Pe W And/or Wo Contrast  Result Date: 08/14/2017 CLINICAL DATA:  Shortness of breath and syncope EXAM: CT ANGIOGRAPHY CHEST WITH CONTRAST TECHNIQUE: Multidetector CT imaging of the chest was performed using the standard protocol during bolus administration of intravenous contrast. Multiplanar CT image reconstructions and MIPs were obtained to evaluate the vascular anatomy. CONTRAST:  ISOVUE-370 IOPAMIDOL (ISOVUE-370) INJECTION 76% COMPARISON:  Chest  radiograph August 14, 2017 FINDINGS: Cardiovascular: There is no demonstrable pulmonary embolus. There is no thoracic aortic aneurysm or dissection. The  visualized great vessels appear normal. There is atherosclerotic calcification in the aorta. Pericardium is not appreciably thickened. Mediastinum/Nodes: Visualized thyroid appears normal. There is no appreciable thoracic adenopathy. There are subcentimeter mediastinal lymph nodes which do not meet size criteria for pathologic significance. No esophageal lesions are evident. Lungs/Pleura: There is extensive airspace consolidation throughout the right upper lobe as well as more patchy infiltrate in the right middle and lower lobes. Consolidation is noted in the medial segment of the right middle lobe. There is mild patchy infiltrate scattered in the posterior segment of the left upper lobe and lingula. There is no appreciable pleural effusion or pleural thickening. Upper Abdomen: Visualized upper abdominal structures appear unremarkable. Musculoskeletal: There is degenerative change throughout much of the thoracic spine. There are no blastic or lytic bone lesions. Review of the MIP images confirms the above findings. IMPRESSION: 1.  No demonstrable pulmonary embolus. 2. Widespread airspace consolidation throughout the right lung with the greatest degree of consolidation in the right upper lobe but with areas of consolidation in the right middle and medial segment right lower lobe. For patchy infiltrate noted on the left in the posterior segment left upper lobe and lingula. Appearance consistent with multifocal pneumonia. 3. Occasional subcentimeter mediastinal lymph nodes without adenopathy by size criteria. 4.  Atherosclerotic calcification of the thoracic aorta. Aortic Atherosclerosis (ICD10-I70.0). Electronically Signed   By: Bretta BangWilliam  Woodruff III M.D.   On: 08/14/2017 12:19    Results/Tests Pending at Time of Discharge:   Discharge Medications:  Allergies as of 08/15/2017      Reactions   Lyrica [pregabalin] Shortness Of Breath   ?allergy   Neurontin [gabapentin] Other (See Comments)   Lightheaded, see  spots   Tramadol    Makes feet tingle      Medication List    STOP taking these medications   nicotine 14 mg/24hr patch Commonly known as:  NICODERM CQ - dosed in mg/24 hours   oxyCODONE-acetaminophen 10-325 MG tablet Commonly known as:  PERCOCET   rivaroxaban 10 MG Tabs tablet Commonly known as:  XARELTO     TAKE these medications   HYDROcodone-acetaminophen 7.5-325 MG tablet Commonly known as:  NORCO Take 1 tablet 3 (three) times daily as needed by mouth for pain.   hydrOXYzine 10 MG tablet Commonly known as:  ATARAX/VISTARIL Take 1 tablet (10 mg total) 3 (three) times daily as needed by mouth for anxiety.   ibuprofen 600 MG tablet Commonly known as:  ADVIL,MOTRIN Take 600 mg 3 (three) times daily as needed by mouth. pain   levofloxacin 750 MG tablet Commonly known as:  LEVAQUIN Take 1 tablet (750 mg total) daily for 5 doses by mouth.   methocarbamol 500 MG tablet Commonly known as:  ROBAXIN Take 1 tablet (500 mg total) by mouth 2 (two) times daily as needed.   nystatin cream Commonly known as:  MYCOSTATIN Apply 1 application topically 2 (two) times daily. x2 weeks   OLANZapine 2.5 MG tablet Commonly known as:  ZYPREXA Take 1 tablet (2.5 mg total) at bedtime by mouth.   ondansetron 4 MG tablet Commonly known as:  ZOFRAN Take 1 tablet (4 mg total) by mouth every 8 (eight) hours as needed for nausea or vomiting.   penicillin v potassium 500 MG tablet Commonly known as:  VEETID Take 500 mg 4 (four) times daily by mouth. Started on 08-02-17.  For 30 days   sennosides-docusate sodium 8.6-50 MG tablet Commonly known as:  SENOKOT-S Take 2 tablets by mouth daily.       Discharge Instructions: Please refer to Patient Instructions section of EMR for full details.  Patient was counseled important signs and symptoms that should prompt return to medical care, changes in medications, dietary instructions, activity restrictions, and follow up appointments.    Follow-Up Appointments: Follow-up Information    Oralia Manisbraham, Sherin, DO. Go on 08/21/2017.   Specialty:  Family Medicine Why:  10:15 am Contact information: [address] [place] [id number] [phone]           Marthenia RollingBland, Kenecia Barren, DO 08/15/2017, 2:26 PM PGY-1, [facility]

## 2017-08-15 NOTE — Progress Notes (Signed)
Family Medicine Teaching Service Daily Progress Note Intern Pager: [phone]  Patient name: Bridget Lovelesslice F Call Medical record number: [id number] Date of birth: 11/18/1960 Age: 56 y.o. Gender: female  Primary Care Provider: Marthenia RollingBland, Odeth Bry, DO Consultants:  Code Status: full  Pt Overview and Major Events to Date:  Bridget [name] is a 56 y.o. female presenting with 6days SOB and a potential LOC x1. PMH is significant for HTN, depression, L hip surgery, and diagnosis of schizoaffective/bipolar.  Assessment and Plan: Bridget [name] is a 56 y.o. female presenting with 6days SOB and a potential LOC x1. PMH is significant for HTN, depression, L hip surgery, and diagnosis of schizoaffective/bipolar.  Pnuemonia/SOB-Pnuemonia is R mulitfocal on CXR. Patient describes 6day symptoms of SOB with subjective fever/chills and productive cough with some streaking hemoptosis. D-dimer elevated at 3.13, but CTA was neg for PE.  ACS less likely given lack of chest pain complaint.  Considered TB given febrile symptoms and patients claim of weight loss over last month but low suspicion given claim of intentional diet and lack of sick contacts/travel. CURB 65=0. Maintaining O2 sats on RA.  -Admit to obs, attending Dr. Jennette KettleNeal -flu test -s/p azithromycin 500 mg IV and ceftriaxone 2g IV in ED (will d/c w/ 5 more days levoquin) -O2 as needed -azithromycin for CAP + beta lactam (already on penicillin v for upcoming dental work) -cbc/bmp in AM -Repeat CXR in 6wks and Ct in 3 months  Potential LOC-Patient not actually sure if she had LOC or just got lightheaded.  Described as while she was on commode so likely vasovagal in combination with potential dehydration of loss of appetite during this illness.   Have considered hypoglycemia but patient was mid 100s on admission and not on any DM medications indicative of hypoglycemia risk.  Considered arhythmia but ECG was NSR and unchanged from prior. -treat pneumonia as  above. -orthostatic vital signs  BH- On atarax inconsistently for anxiety.   Some record of past diagnosis of schizoaffective and bipolar in records but not on medication for that.   Patient was very anxious and almost jumpy, hard to direct in conversation.  Appears to be manic. Non-compliant on meds -atarax at home dose -[facility] low dose to start, unsure if patient will continue (zyprexa 2.5 mg QHS)  Chronic pain - [facility] - continue home norco 7.5-325 mg TID prn; robaxin 500 mg BID prn  Dental infection - on penicillin 500 mg qid x 30 days per records prior to planned tooth extractions - continue penicillin  Hyponatremia - Resolving Na 134 on admission. - Recheck on BMP in am  Hematuria: Ua with moderate hemoglobin on dipstick but negative nitrites and leukocytes. Patient endorses increased urinary frequency but has also increased her intake of water.  - s/p ceftriaxone in the ED - urine culture pending - Plan to repeat UA at follow-up   FEN/GI: regular diet/ zofran prn Prophylaxis: lovenox  Disposition: to home after obs  Subjective:  Feeling well and requesting to leave by 6am.   Agreed to wait for rounds and was willing to start olanzapine for hypomania.   No SI/HI  Objective: Temp:  [97.8 F (36.6 C)-102.1 F (38.9 C)] 100 F (37.8 C) (11/13 0611) Pulse Rate:  [82-108] 89 (11/13 0611) Resp:  [15-25] 18 (11/13 0611) BP: (95-125)/(51-79) 95/51 (11/13 0611) SpO2:  [95 %-100 %] 99 % (11/13 0611) Weight:  [202 lb 14.4 oz (92 kg)-207 lb (93.9  kg)] 202 lb 14.4 oz (92 kg) (11/12 1829) Physical Exam: General: anxious and jumpy, NAD, walking around room without difficulty Eyes: EOMI, PERRLA ENTM: dry MM, clear nares Cardiovascular: RRR, no murmurs noted Respiratory: CTAB, less airflow on R, no wheezes Gastrointestinal: no TTP, bowel sounds throughout, soft/non-distended MSK: able to ambulate with no  concern, no deficits noted Derm: no lesions noted Neuro: grossly intact Psych: alert and able to process discussion, slightly distractable and manic in presentation   Laboratory: Recent Labs  Lab 08/14/17 0800 08/15/17 0454  WBC 11.3* 9.2  HGB 11.4* 10.6*  HCT 34.0* 32.5*  PLT 213 232   Recent Labs  Lab 08/14/17 0800 08/15/17 0454  NA 128* 134*  K 3.2* 3.1*  CL 95* 101  CO2 23 23  BUN 5* <5*  CREATININE 0.77 0.68  CALCIUM 8.3* 8.6*  GLUCOSE 124* 113*      Imaging/Diagnostic Tests: Dg Chest 2 View  Result Date: 08/14/2017 CLINICAL DATA:  Cough and fever 4 days EXAM: CHEST  2 VIEW COMPARISON:  March 20, 2015 FINDINGS: There is extensive airspace consolidation throughout much of the right upper lobe with involvement anteriorly and posteriorly. A small amount of patchy infiltrate is noted in the right middle lobe. There is linear atelectasis in the left mid lung. Left lung otherwise is clear. Heart size and pulmonary vascularity are normal. No adenopathy. There is degenerative change in the thoracic spine. IMPRESSION: Extensive airspace consolidation consistent with pneumonia throughout much of the right upper lobe. More patchy infiltrate noted right middle lobe. There is mild atelectasis left mid lung. Left lung otherwise clear. Cardiac silhouette within normal limits. No evident adenopathy. Followup PA and lateral chest radiographs recommended in 3-4 weeks following trial of antibiotic therapy to ensure resolution and exclude underlying malignancy. Electronically Signed   By: William  Woodruff III M.D.   On: 08/14/2017 08:38   Ct Angio Chest Pe W And/or Wo Contrast  Result Date: 08/14/2017 CLINICAL DATA:  Shortness of breath and syncope EXAM: CT ANGIOGRAPHY CHEST WITH CONTRAST TECHNIQUE: Multidetector CT imaging of the chest was performed using the standard protocol during bolus administration of intravenous contrast. Multiplanar CT image reconstructions and MIPs were obtained  to evaluate the vascular anatomy. CONTRAST:  STeresoLavella HaSFreedom BehavioralilOrLifecare Behavioral Health HospitalKentuckylDykMarland Kitche<MSusan7015 02TeresoLavella HaSWeeks Medical CenterilOrSt. Elizabeth HospitalKentuckylDykMarland Kitche<MSusan54 NE. TeresoLavella HaSNorwalk Surgery Center LLCilOrAlliance Specialty Surgical CenterKentuckylDykMarland Kitche<MSusan42 PeTeresoLavella HaSSouthwestern Regional Medical CenterilOrHosp Dr. Cayetano Coll Y TosteKentuckylDykMarland Kitche<MSusan9TeresoLavella HaSHosp Psiquiatrico Dr Ramon Fernandez MarinailOrTrace Regional HospitalKentuckylDykMarland Kitche<MSusan8TeresoLavella HaSOchsner Rehabilitation HospitalilOrTexas Health Huguley Surgery Center LLKentuckylDykMarland Kitche<MSusan1TeresoLavella HaSZachary - Amg Specialty HospitalilOrSan Antonio State HospitalKentuckylDykMarland Kitche<MSusan302McDoTeresoLavella HaSGlendora Community HospitalilOrKindred Hospital AuroraKentuckylDykMarland Kitche<MSTeresoLavella HaSUs Army Hospital-YumailOrMckenzie Surgery Center LPKentuckylDykMarland Kitche<MSusan358 0TeresoLavella HaSAlliance Community HospitalilOrMercy Medical Center-ClintonKentuckylDykMarland Kitche<MSusan7779 CTeresoLavella HaSMethodist Ambulatory Surgery Center Of Boerne LLCilOrMethodist Hospital-ErKentuckylDykMarland Kitche<MSusan700TeresoLavella HaSValley HospitalilOrKiowa County Memorial HospitalKentuckylDykMarland Kitche<MSusan754 PTeresoLavella HaSAltru Specialty HospitalilOrAdvanced Care Hospital Of White CountyKentuckylDykMarland Kitche<MSusaTeresoLavella HaSMethodist Richardson Medical CenterilOrSerra Community Medical Clinic IncKentuckylDykMarland Kitche<MSusaTeresoLavella HaSZazen Surgery Center LLCilOrSt Thomas Medical Group Endoscopy Center LLKentuckylDykMarland Kitche<MSusanTeresoLavella HaSW.J. Mangold Memorial HospitalilOrAccel Rehabilitation Hospital Of PlanoKentuckylDykMarland Kitche<MEASUSusan8747 SOrleFairview Developmental Centern<MSusan100 NTeresoLavella HaSMedical Center Of South ArkansasilOrNorthwest Ohio Endoscopy CenterKentuckylDykMarland Kitche<MSusan8918 STeresoLavella HaSBeacan Behavioral Health BunkieilOrNashville Gastroenterology And Hepatology PcKentuckylDykMarland Kitche<MSusan61 South02MamTeresoLavella HaSPlains Memorial HospitalilOrIngalls Same Day Surgery Center Ltd PtrKentuckylDykMarland Kitche<MSusan980TeresoLavella HaSThedacare Regional Medical Center Appleton IncilOrSaint Joseph HospitalKentuckylDykMarland Kitche<MSusan803 North0TeresoLavella HaSDoctors Gi Partnership Ltd Dba Melbourne Gi CenterilOrPreston Memorial HospitalKentuckylDykMarland Kitche<MSusan47TeresoLavella HaSArizona Institute Of Eye Surgery LLCilOrDigestive Disease Endoscopy Center IncKentuckylDykMarland Kitche<MSusan8840 O02TeresoLavella HaSThe Maryland Center For Digestive Health LLCilOrBeacon Behavioral Hospital NorthshoreKentuckylDykMarland Kitche<MSusan31 E02TTeresoLavella HaSPearl River County HospitalilOrDallas Regional Medical CenterKentuckylDykMarland Kitche<MSusan45302New PTeresoLavella HaSCoastal Powers HospitalilOrSaint Francis Gi Endoscopy LLKentuckylDykMarland Kitche<MEASUSOrleThe Surgery And Endoscopy Center LLCn<MSusan34 N.TeresoLavella HaSClinch Valley Medical CenterilOrEssentia Health SandstoneKentuckylDykMarland Kitche<MSusa02WeTeresoLavella HaSEd Fraser Memorial HospitalilOrColumbus HospitalKentuckylDykMarland Kitche<MSusan7806TeresoLavella HaSUpmc St MargaretilOrAdvanced Endoscopy Center PscKentuckylDykMarland Kitche<MSusan38 East TeresoLavella HaSNorthridge Outpatient Surgery Center IncilOrPrisma Health Greenville Memorial HospitalKentuckylDykMarland Kitchene Brackettlfonso EllisOL ([id number]) INJECTION 76% COMPARISON:  Chest radiograph August 14, 2017 FINDINGS: Cardiovascular: There is no demonstrable pulmonary embolus. There is no thoracic aortic aneurysm or dissection. The visualized great vessels appear normal. There is atherosclerotic calcification in the aorta. Pericardium is not appreciably thickened. Mediastinum/Nodes: Visualized thyroid appears normal. There is no appreciable thoracic adenopathy. There are subcentimeter mediastinal lymph nodes which do not meet size criteria for pathologic significance. No esophageal lesions are evident. Lungs/Pleura: There is extensive airspace consolidation throughout the right upper lobe as well as more patchy infiltrate in the right middle and lower lobes. Consolidation is noted in the medial segment of the right middle lobe. There is mild patchy infiltrate scattered in the posterior segment of the left upper lobe and lingula. There is no appreciable pleural effusion or pleural thickening. Upper Abdomen: Visualized upper abdominal structures appear unremarkable. Musculoskeletal: There is degenerative change throughout much of the thoracic spine. There are no blastic or lytic bone lesions. Review of the MIP images confirms the above findings. IMPRESSION: 1.  No demonstrable pulmonary embolus. 2. Widespread airspace consolidation throughout the right lung with the greatest degree of consolidation in the right upper lobe but with areas of consolidation in the right middle and medial segment right lower  lobe. For patchy infiltrate noted on the left in the posterior segment left upper lobe and lingula. Appearance consistent with multifocal pneumonia. 3. Occasional subcentimeter mediastinal lymph nodes without adenopathy by size criteria. 4.  Atherosclerotic calcification of the thoracic aorta. Aortic Atherosclerosis (ICD10-I70.0). Electronically Signed   By: Bretta Bang  III M.D.   On: 08/14/2017 12:19     Marthenia Rolling, DO 08/15/2017, 6:42 AM PGY-1, [facility] Family Medicine FPTS Intern pager: [phone], text pages welcome

## 2017-08-15 NOTE — Progress Notes (Signed)
Notified MD oncall that nurse removed IV per pt request. Pt not receiving any IV meds or IVF. MD gave order to leave IV out (not to restart). Will continue to monitor pt. Nelda MarseilleJenny Thacker, RN

## 2017-08-15 NOTE — Progress Notes (Signed)
Patient discharge teaching given, including activity, diet, follow-up appoints, and medications. Patient verbalized understanding of all discharge instructions. IV access was d/c'd. Vitals are stable. Skin is intact except as charted in most recent assessments. Pt to be escorted out by NT, to be driven home by family. 

## 2017-08-15 NOTE — Discharge Instructions (Signed)
You were admitted to the hospital for pneumonia.  We have prescribed you an antibiotic called Levaquin he should take this medicine for 5 more days starting tomorrow 08/16/2017.  We have scheduled you a follow-up appointment at the family medicine center on 08/21/2017 at 10:15 AM.  We are also recommending that you get a follow-up chest x-ray in 6 weeks and a repeat CAT scan of your chest in 3 months and we have recommended this to the doctor that will be following up with you.

## 2017-08-15 NOTE — Progress Notes (Signed)
Notified MD oncall that pt got up and washed up this morning. Pt put on a dress and requesting not to put telemetry monitor back on. MD stated that she would d/c order for telemetry. Will continue to monitor. Nelda MarseilleJenny Thacker, RN

## 2017-08-16 ENCOUNTER — Telehealth: Payer: Self-pay | Admitting: *Deleted

## 2017-08-16 NOTE — Telephone Encounter (Signed)
Routing to doctor seeing pt on 08/21/2017 for hospital follow up for this.  Did not see that these had been ordered yet. Please inform pt at visit and place orders so that these can be scheduled on Monday. Lamonte SakaiZimmerman Rumple, Kenecia Barren D, [place]MA

## 2017-08-16 NOTE — Telephone Encounter (Signed)
-----   Message from WaldenScott Bland, DO sent at 08/15/2017  2:54 PM EST ----- Regarding: please order imaging Can we schedule a CXR in 6wks and a Chest ct in 3months (concern that pneumonia might be postobstructive and we want to rule out mass)

## 2017-08-19 LAB — CULTURE, BLOOD (ROUTINE X 2)
Culture: NO GROWTH
Culture: NO GROWTH
Special Requests: ADEQUATE
Special Requests: ADEQUATE

## 2017-08-20 NOTE — Progress Notes (Signed)
   Subjective:    Patient ID: Bridget LovelessAlice F [name], female    DOB: 04/20/1961, 56 y.o.   MRN: [id number]   CC: Hospital follow up  HPI: Hospital follow up Admitted 08/14/17 - 08/15/2017 for SOB and "passing out". CXR concerning for pneumonia and discharged with 5 days levoquin. Patient today states she is doing well and back to baseline. States she is "feeling good" with no complaints of SOB, cough, fever, chills, nausea, vomiting, diarrhea, or congestion. Patient states appetite is also improved. Patient has finished course of antibiotics with no concerns at this time. Discharge summary noted that patient will need CXR in late December 2018 and repeat CT in mid February 2019 for possible post-obstructive mass.   During hospitalization patient noted to be hypomanic and started on olanzapine. Patient with no concerns at this time. States she has noticed improvement in mood and has less racing thoughts. States she is "learning how to slow down" and medicine is helping with this. States improvement in sleep, most noted for past 2 nights where she "slept well".   Objective:  BP 119/80 (BP Location: Right Arm, Patient Position: Sitting, Cuff Size: Normal)   Pulse 95   Temp 98.1 F (36.7 C) (Oral)   Ht 5\' 5"  (1.651 m)   Wt 204 lb 3.2 oz (92.6 kg)   LMP  (LMP Unknown)   SpO2 96%   BMI 33.98 kg/m  Vitals and nursing note reviewed  General: well nourished, in no acute distress HEENT: normocephalic, PERRL, no scleral icterus or conjunctival pallor, no nasal discharge, moist mucous membranes, without erythema or discharge noted in posterior oropharynx Neck: supple, non-tender, without lymphadenopathy Cardiac: RRR, clear S1 and S2, no murmurs, rubs, or gallops Respiratory: clear to auscultation bilaterally, no increased work of breathing Abdomen: soft, nontender, nondistended, no masses or organomegaly. Bowel sounds present Extremities: no edema or cyanosis. Warm, well perfused. Skin: warm and dry,  no rashes noted, scar on LLQ from EKG strip  Neuro: alert and oriented, no focal deficits Psych: normal affect, not currently manic    Assessment & Plan:    Community acquired pneumonia of right upper lobe of lung [facility](HCC) Patient today for hospital follow up for pneumonia. Patient no longer symptomatic. States she has finished Levaquin course and has no concerns.  -strict return precautions given -recommend that PCP order repeat CXR in 6 weeks (late December 2018) and repeat Chest CT in 3 months (mid February 2019) -recommend follow up as needed   Mood disorder [facility](HCC) Patient started on olanzapine while hospitalized for hypomania. Patient states she is improved since starting medication. Patient has noticed decrease in racing thoughts and improved sleep. No concerns at this time. Currently not manic on exam  -continue olanzapine  -recommend follow up with PCP for medication management -follow up as needed   Healthcare maintenance Mammogram and colonoscopy referral made today. Patient given information on how to schedule these and verbalized that she will call both breast center and GI to schedule mammogram and colonoscopy respectively.    Return if symptoms worsen or fail to improve, for medication management with PCP.   Oralia ManisSherin Ormond Lazo, DO, PGY-1

## 2017-08-21 ENCOUNTER — Encounter: Payer: Self-pay | Admitting: Family Medicine

## 2017-08-21 ENCOUNTER — Telehealth: Payer: Self-pay | Admitting: Family Medicine

## 2017-08-21 ENCOUNTER — Other Ambulatory Visit: Payer: Self-pay

## 2017-08-21 ENCOUNTER — Ambulatory Visit: Payer: Medicaid Other | Admitting: Family Medicine

## 2017-08-21 VITALS — BP 119/80 | HR 95 | Temp 98.1°F | Ht 65.0 in | Wt 204.2 lb

## 2017-08-21 DIAGNOSIS — J181 Lobar pneumonia, unspecified organism: Secondary | ICD-10-CM

## 2017-08-21 DIAGNOSIS — F39 Unspecified mood [affective] disorder: Secondary | ICD-10-CM | POA: Diagnosis not present

## 2017-08-21 DIAGNOSIS — Z0000 Encounter for general adult medical examination without abnormal findings: Secondary | ICD-10-CM | POA: Diagnosis not present

## 2017-08-21 DIAGNOSIS — R918 Other nonspecific abnormal finding of lung field: Secondary | ICD-10-CM | POA: Insufficient documentation

## 2017-08-21 DIAGNOSIS — J189 Pneumonia, unspecified organism: Secondary | ICD-10-CM

## 2017-08-21 HISTORY — DX: Other nonspecific abnormal finding of lung field: R91.8

## 2017-08-21 NOTE — Assessment & Plan Note (Addendum)
Patient started on olanzapine while hospitalized for hypomania. Patient states she is improved since starting medication. Patient has noticed decrease in racing thoughts and improved sleep. No concerns at this time. Currently not manic on exam  -continue olanzapine  -recommend follow up with PCP for medication management -follow up as needed

## 2017-08-21 NOTE — Telephone Encounter (Signed)
Ordering f/u imaging for a patient with concerning imaging during hospitalization.   CXR in 6wks to check for resolution of pneumonia and CT in 3 months with hopefuly clearer picture to evaluate for malignancy

## 2017-08-21 NOTE — Patient Instructions (Signed)
Community-Acquired Pneumonia, Adult Pneumonia is an infection of the lungs. One type of pneumonia can happen while a person is in a hospital. A different type can happen when a person is not in a hospital (community-acquired pneumonia). It is easy for this kind to spread from person to person. It can spread to you if you breathe near an infected person who coughs or sneezes. Some symptoms include:  A dry cough.  A wet (productive) cough.  Fever.  Sweating.  Chest pain.  Follow these instructions at home:  Take over-the-counter and prescription medicines only as told by your doctor. ? Only take cough medicine if you are losing sleep. ? If you were prescribed an antibiotic medicine, take it as told by your doctor. Do not stop taking the antibiotic even if you start to feel better.  Sleep with your head and neck raised (elevated). You can do this by putting a few pillows under your head, or you can sleep in a recliner.  Do not use tobacco products. These include cigarettes, chewing tobacco, and e-cigarettes. If you need help quitting, ask your doctor.  Drink enough water to keep your pee (urine) clear or pale yellow. A shot (vaccine) can help prevent pneumonia. Shots are often suggested for:  People older than 55 years of age.  People older than 55 years of age: ? Who are having cancer treatment. ? Who have long-term (chronic) lung disease. ? Who have problems with their body's defense system (immune system).  You may also prevent pneumonia if you take these actions:  Get the flu (influenza) shot every year.  Go to the dentist as often as told.  Wash your hands often. If soap and water are not available, use hand sanitizer.  Contact a doctor if:  You have a fever.  You lose sleep because your cough medicine does not help. Get help right away if:  You are short of breath and it gets worse.  You have more chest pain.  Your sickness gets worse. This is very serious  if: ? You are an older adult. ? Your body's defense system is weak.  You cough up blood. This information is not intended to replace advice given to you by your health care provider. Make sure you discuss any questions you have with your health care provider. Document Released: 03/07/2008 Document Revised: 02/25/2016 Document Reviewed: 01/14/2015 Elsevier Interactive Patient Education  [employer]2018 Elsevier Inc.   It was a pleasure meeting you today.   Today we discussed your hospital follow up.  I would recommend you make a follow up with your PCP Dr. Parke SimmersBland for medication management.   I will also order a repeat CXR in 6 weeks and repeat chest CT in 3 months.   Please also schedule a colonoscopy and mammogram for preventative care.   Please follow up as needed or sooner if symptoms persist or worsen. Please call the clinic immediately if you have concerns.   Our clinic's number is [phone]. Please call with questions or concerns.   Thank you,  Oralia ManisSherin Cecylia Brazill, DO

## 2017-08-21 NOTE — Assessment & Plan Note (Signed)
Mammogram and colonoscopy referral made today. Patient given information on how to schedule these and verbalized that she will call both breast center and GI to schedule mammogram and colonoscopy respectively.

## 2017-08-21 NOTE — Assessment & Plan Note (Signed)
Patient today for hospital follow up for pneumonia. Patient no longer symptomatic. States she has finished Levaquin course and has no concerns.  -strict return precautions given -recommend that PCP order repeat CXR in 6 weeks (late December 2018) and repeat Chest CT in 3 months (mid February 2019) -recommend follow up as needed

## 2017-08-26 ENCOUNTER — Telehealth: Payer: Self-pay | Admitting: Student

## 2017-08-26 NOTE — Telephone Encounter (Signed)
FM after hour call response: Called patient in response to her page on after hour pager. Patient picked up the phone and verified her name and age.  Her phone went out of battery and dropped right after patient started talking about irritation of her "bottom"

## 2017-09-01 ENCOUNTER — Ambulatory Visit: Payer: Medicaid Other | Admitting: Family Medicine

## 2017-09-07 ENCOUNTER — Encounter: Payer: Self-pay | Admitting: Family Medicine

## 2017-09-07 ENCOUNTER — Other Ambulatory Visit: Payer: Self-pay

## 2017-09-07 ENCOUNTER — Ambulatory Visit: Payer: Medicaid Other | Admitting: Family Medicine

## 2017-09-07 VITALS — BP 135/70 | HR 78 | Temp 98.2°F | Ht 65.0 in | Wt 199.4 lb

## 2017-09-07 DIAGNOSIS — K319 Disease of stomach and duodenum, unspecified: Secondary | ICD-10-CM

## 2017-09-07 DIAGNOSIS — R918 Other nonspecific abnormal finding of lung field: Secondary | ICD-10-CM | POA: Diagnosis not present

## 2017-09-07 DIAGNOSIS — J189 Pneumonia, unspecified organism: Secondary | ICD-10-CM

## 2017-09-07 DIAGNOSIS — S61411A Laceration without foreign body of right hand, initial encounter: Secondary | ICD-10-CM

## 2017-09-07 DIAGNOSIS — J181 Lobar pneumonia, unspecified organism: Secondary | ICD-10-CM | POA: Diagnosis present

## 2017-09-07 HISTORY — DX: Disease of stomach and duodenum, unspecified: K31.9

## 2017-09-07 NOTE — Patient Instructions (Signed)
It was a pleasure to see you today! Thank you for choosing [facility] for your primary care. Bridget [name] was seen for hospital f/u. Come back to the clinic if your symptoms return, and go to the emergency room if you get any life threatening symptoms.  Today we talked about how your pneumonia symptoms have gone away and you have no shortness of breath, fever, or cough.   We also talked about your concerns for the small wound on your belly and on your two fingers from the mirror cut.   Those all seem to be healing well.   Try to keep them clean and dry.   If we did any lab work today, and the results require attention, either me or my nurse will get in touch with you. If everything is normal, you will get a letter in mail and a message via . If you don't hear from us in two weeks, please give us a call. Otherwise, we look forward to seeing you again at your next visit. If you have any questions or concerns before then, please call the clinic at [phone].  Please bring all your medications to every doctors visit  Sign up for My Chart to have easy access to your labs results, and communication with your Primary care physician.    Please check-out at the front desk before leaving the clinic.    Best,  Dr. Marthenia RollingScott Marguerite [name] FAMILY MEDICINE RESIDENT - PGY1 09/07/2017 10:12 AM

## 2017-09-07 NOTE — Assessment & Plan Note (Signed)
Patient seems insistent it was a "burn" from ecg pad.  Location and sole lesion don't seem to make sense but the wound seems to be healing well, app. 1 cm diameter and superficial with no surrounding erythema or drainage.   Patient denies any pain or febrile symptoms

## 2017-09-07 NOTE — Assessment & Plan Note (Signed)
Claims cut happened 1 wk ago.   Minor laceration through distal fingertips or 2nd and 3rd digit.   No sign of erythema/bleeding.  Sensation intact.   Patient has 8254yrs left for next tetanus shot.

## 2017-09-07 NOTE — Assessment & Plan Note (Signed)
Discussed timing of f/u imagine w/ patient now that the pneumonia has resolved.   CXR ~jan1 2019 and CT ~may 2019.

## 2017-09-07 NOTE — Progress Notes (Signed)
    Subjective:  Bridget [name] is a 56 y.o. female who [facility] the [facility] today with a chief complaint of stomach lesion on R lower abdomen.   HPI: She states this lesion has been healing since her hospitalization.   She thinks it was a "burn" from an ecg pad.  No other noted trauma or bruising.  She denies pain or recent bleeding/drainage.  She has been applying over the counter "triple antibiotic ointment"  Objective:  Physical Exam: BP 135/70 (BP Location: Right Wrist, Patient Position: Sitting, Cuff Size: Large)   Pulse 78   Temp 98.2 F (36.8 C) (Oral)   Ht 5\' 5"  (1.651 m)   Wt 90.4 kg (199 lb 6.4 oz)   LMP  (LMP Unknown)   SpO2 98%   BMI 33.18 kg/m   Gen: NAD, resting comfortably CV: RRR with no murmurs appreciated Pulm: NWOB, CTAB with no crackles, wheezes, or rhonchi GI: Normal bowel sounds present. Soft, Nontender, Nondistended. MSK: no edema, cyanosis, or clubbing noted Skin: R lower abdomen 1cm diam lesion through epidermis, non draining/bleeding, no surrounding erythema.   2 fingers with minor, healed laceration at fingertips (2nd/3rd digit) with no sensation deficits, cap refill intact Neuro: grossly normal, moves all extremities Psych: Normal affect and thought content  No results found for this or any previous visit (from the past 72 hour(s)).   Assessment/Plan:  Nonspecific abnormal findings on imaging of lung Discussed timing of f/u imagine w/ patient now that the pneumonia has resolved.   CXR ~jan1 2019 and CT ~may 2019.  Lesion of stomach Patient seems insistent it was a "burn" from ecg pad.  Location and sole lesion don't seem to make sense but the wound seems to be healing well, app. 1 cm diameter and superficial with no surrounding erythema or drainage.   Patient denies any pain or febrile symptoms  Laceration of right hand without foreign body Claims cut happened 1 wk ago.   Minor laceration through distal fingertips or 2nd and 3rd digit.   No sign  of erythema/bleeding.  Sensation intact.   Patient has 6447yrs left for next tetanus shot.   Marthenia RollingScott Delois Tolbert, DO FAMILY MEDICINE RESIDENT - PGY1 09/07/2017 1:30 PM

## 2017-09-17 ENCOUNTER — Other Ambulatory Visit: Payer: Self-pay | Admitting: Family Medicine

## 2017-10-09 ENCOUNTER — Other Ambulatory Visit: Payer: Self-pay | Admitting: Family Medicine

## 2017-10-17 ENCOUNTER — Telehealth: Payer: Self-pay | Admitting: Family Medicine

## 2017-10-17 DIAGNOSIS — Z96649 Presence of unspecified artificial hip joint: Secondary | ICD-10-CM

## 2017-10-17 NOTE — Telephone Encounter (Signed)
Pt needs referral to her orthopedic dr she goes to, Dr Dion SaucierLandau. She called them to make an appointment and they said since its a new year she would need another referral.

## 2017-10-17 NOTE — Telephone Encounter (Signed)
This patient with a hx of a total hip arthroplasty was told by her orthopedist she needed a new referal for a new calendar year in order to continue followup

## 2017-10-23 ENCOUNTER — Telehealth: Payer: Self-pay | Admitting: *Deleted

## 2017-10-23 NOTE — Telephone Encounter (Signed)
Received message on nurse line from [place] with [facility]. States pt has 2 upcoming endoscopy procedures and pt will require f/u chest x-ray to ensure pneumonia is cleared prior to procedures. [facility] next couple of days. Please call Shanda BumpsJessica at [phone] when this has been done. Kinnie FeilL. Ducatte, RN, BSN

## 2017-10-27 ENCOUNTER — Encounter: Payer: Self-pay | Admitting: Family Medicine

## 2017-10-27 ENCOUNTER — Ambulatory Visit (HOSPITAL_COMMUNITY)
Admission: RE | Admit: 2017-10-27 | Discharge: 2017-10-27 | Disposition: A | Discharge status: Home / Self Care | Payer: Medicaid Other | Source: Ambulatory Visit | Attending: Family Medicine | Admitting: Family Medicine

## 2017-10-27 ENCOUNTER — Other Ambulatory Visit: Payer: Self-pay

## 2017-10-27 ENCOUNTER — Ambulatory Visit: Payer: Medicaid Other | Admitting: Family Medicine

## 2017-10-27 VITALS — BP 92/58 | HR 78 | Temp 98.0°F | Ht 65.0 in | Wt 191.8 lb

## 2017-10-27 DIAGNOSIS — M79671 Pain in right foot: Secondary | ICD-10-CM | POA: Diagnosis present

## 2017-10-27 DIAGNOSIS — M79672 Pain in left foot: Secondary | ICD-10-CM | POA: Diagnosis not present

## 2017-10-27 DIAGNOSIS — Z0000 Encounter for general adult medical examination without abnormal findings: Secondary | ICD-10-CM | POA: Diagnosis not present

## 2017-10-27 DIAGNOSIS — Z113 Encounter for screening for infections with a predominantly sexual mode of transmission: Secondary | ICD-10-CM | POA: Diagnosis not present

## 2017-10-27 DIAGNOSIS — R2681 Unsteadiness on feet: Secondary | ICD-10-CM | POA: Diagnosis not present

## 2017-10-27 DIAGNOSIS — R918 Other nonspecific abnormal finding of lung field: Secondary | ICD-10-CM

## 2017-10-27 HISTORY — DX: Encounter for screening for infections with a predominantly sexual mode of transmission: Z11.3

## 2017-10-27 HISTORY — DX: Unsteadiness on feet: R26.81

## 2017-10-27 HISTORY — DX: Pain in right foot: M79.671

## 2017-10-27 NOTE — Assessment & Plan Note (Signed)
We discussed again her need for a mammorgram and she has agreed to schedule an appt.  She has no current breast complaints/symptoms

## 2017-10-27 NOTE — Assessment & Plan Note (Signed)
Patient has concern of potential transmission from partner, request full STI panel.  We discussed their upcoming pap and agreed to test for G/C/trich then because she does not have current symptoms.  We are going to order HIV/RPR labs today at her request

## 2017-10-27 NOTE — Assessment & Plan Note (Signed)
Located anterior calcaneous bilaterally, worse when walking, no skin changes, point tenderness consistent with plantar fascitis.  Discussed rolling/stretching/ice/rest/nsaids.   She has existing relationship and followups with ortho s/p hip replacement and has said she will also check in with them at her next appt.

## 2017-10-27 NOTE — Assessment & Plan Note (Signed)
She is overdue for followup imaging from prior pneumonia hospitalization and we discussed this.  She is planning to go to hospital today after our appt and get the CXR.   No respiratory symptoms at this time.

## 2017-10-27 NOTE — Assessment & Plan Note (Addendum)
She has had trouble on a few occasions in the last few weeks keeping her balance because she says her R hip hurts.   She is following with ortho for this and has an appt scheduled.  She has no vertigo/syncope symptoms.  We discussed the use of the cane and fall risks but she has but she is adamant that she is ok and does not need it.

## 2017-10-27 NOTE — Progress Notes (Signed)
Subjective:  Bridget [name] is a 57 y.o. female who [facility] with a chief complaint of foot pain bilaterally.     HPI: FEET/Gait For the last app. 2wks she has had an intermittent pain in her feet bilaterally.  This is located along the plantar surface of the foot.  This is worse when she walks a longer distance and is worse with "pushing off" of the foot.   It is throbbing in sensation and does not radiate.   There is no visible wound and no loss of sensation/motor control.   There has been no specific traumatic events.   She has noticed it sometimes feels better after stretching or massage.   She says pain in her hip/feet have been affecting her gait and she has "almost lost her balance" a few times due to pain.   She says she still has full strength, has no loss of sensation/motor control, no syncope, no vertigo, no falls.   She adamantly declines to use her cane from hip surgery and says it is not necessary.  She is following with ortho as well and will talk to them about her hip.  Screening mammorgram: She acknowledges she has been not taking care of this but is now dedicated to getting it done.  She has no relevant complaints  Screening for STI: She did not want to share specifics and she claims no symptoms but she said she even though she has been with the same partner for multiple years that she now feels it is necessary to get and STI check.   She has a pap  Scheduled in a few weeks and wants to reschedule that with a female physician.  Lung screening for unspecified findings: We discussed f/u CXR from prior pneumonia hospitalization and the need to get that done even if she was feeling complete resolution from respiratory symptoms.  She has agreed to get the CXR today.   Objective:  Physical Exam: BP (!) 92/58   Pulse 78   Temp 98 F (36.7 C) (Oral)   Ht 5\' 5"  (1.651 m)   Wt 191 lb 12.8 oz (87 kg)   LMP  (LMP Unknown)   SpO2 95%   BMI 31.92 kg/m   Gen:  NAD,  Otherwise sitting comfortably but rubbing feet on arrival to room CV: RRR with no murmurs appreciated Pulm: NWOB, CTAB with no crackles, wheezes, or rhonchi GI: Soft, Nontender, Nondistended. MSK: no edema, cyanosis, or clubbing noted.  No point tenderness to hip, Gait intact on exam Feet: Right (painless plantar wart on right 5th metatarsal, palpable point tenderness with no skin changes on plantar surface of anterior calcaneous consistent with plantar fascitis)  Left (palpable point tenderness to plantar surface of foot along medial insertion of plantar fascia to calncaneous w/ no skin changes) Skin: warm, dry Neuro: grossly normal, moves all extremities Psych: Normal affect and thought content Pelvic exam declined, will reschedule with female physician  No results found for this or any previous visit (from the past 72 hour(s)).   Assessment/Plan:  Routine screening for STI (sexually transmitted infection) Patient has concern of potential transmission from partner, request full STI panel.  We discussed their upcoming pap and agreed to test for G/C/trich then because she does not have current symptoms.  We are going to order HIV/RPR labs today at her request  Foot pain, bilateral Located anterior calcaneous bilaterally, worse when walking, no skin changes, point tenderness consistent with plantar  fascitis.  Discussed rolling/stretching/ice/rest/nsaids.   She has existing relationship and followups with ortho s/p hip replacement and has said she will also check in with them at her next appt.  Healthcare maintenance We discussed again her need for a mammorgram and she has agreed to schedule an appt.  She has no current breast complaints/symptoms  Nonspecific abnormal findings on imaging of lung She is overdue for followup imaging from prior pneumonia hospitalization and we discussed this.  She is planning to go to hospital today after our appt and get the CXR.   No respiratory symptoms  at this time.  Gait instability She has had trouble on a few occasions in the last few weeks keeping her balance because she says her R hip hurts.   She is following with ortho for this and has an appt scheduled.  She has no vertigo/syncope symptoms.  We discussed the use of the cane and fall risks but she has but she is adamant that she is ok and does not need it.   Marthenia Rolling, DO FAMILY MEDICINE RESIDENT - PGY1 10/27/2017 5:36 PM

## 2017-10-27 NOTE — Patient Instructions (Signed)
It was a pleasure to see you today! Thank you for choosing [facility] for your primary care. Bridget [name] was seen for pain in feet. Come back to the clinic if your pain does not improve after seeing ortho, and go to the emergency room if you have any life threatening symptoms.  Today we talked about how your foot pain is most likely plantar fascitis and we recommended ice/stretching/rolling and ibuprofen for your pain.   We also discussed an overdue mammogram and a CXR from followup for your last hospitalization.   We also discussed changing your pap appt to a female provider which you can do at the front desk and you wanting liver bloodwork which we'd need to do after 1/30.    If we did any lab work today, and the results require attention, either me or my nurse will get in touch with you. If everything is normal, you will get a letter in mail and a message via . If you don't hear from us in two weeks, please give us a call. Otherwise, we look forward to seeing you again at your next visit. If you have any questions or concerns before then, please call the clinic at [phone].  Please bring all your medications to every doctors visit  Sign up for My Chart to have easy access to your labs results, and communication with your Primary care physician.    Please check-out at the front desk before leaving the clinic.    Best,  Dr. Marthenia RollingScott Anagabriela Jokerst FAMILY MEDICINE RESIDENT - PGY1 10/27/2017 2:42 PM

## 2017-10-28 LAB — HIV ANTIBODY (ROUTINE TESTING W REFLEX): HIV Screen 4th Generation wRfx: NONREACTIVE

## 2017-10-28 LAB — RPR: RPR: NONREACTIVE

## 2017-11-02 ENCOUNTER — Ambulatory Visit: Payer: Medicaid Other | Admitting: Family Medicine

## 2017-11-04 ENCOUNTER — Other Ambulatory Visit: Payer: Self-pay | Admitting: Student

## 2017-11-04 DIAGNOSIS — F411 Generalized anxiety disorder: Secondary | ICD-10-CM

## 2017-11-04 MED ORDER — HYDROXYZINE HCL 10 MG PO TABS: 10.0000 mg | ORAL_TABLET | Freq: Three times a day (TID) | ORAL | 0 refills | Status: DC | PRN

## 2017-11-04 NOTE — Progress Notes (Signed)
FM after hour call response: Called patient in response to her page on after hour pager. Patient picked up the phone and verified her name and age.  CC: Not able to eat due to anxiety HPI: Patient says she is anxious to eat food. She is worried that she could have belly pain if she eats. Reports nausea but denies emesis or diarrhea. Denies recent illness. She says she is out of her Atarax and [facility] [place].   Red Flags: none Assessment: Anxiety Plan: -Encouraged starting with small frequent mechanically soft diet or liquid -Sent Rx for Atarax to requested pharmacy -Recommended going to ED if concerning symptoms or not able to tolerate anything by mouth.

## 2017-11-06 ENCOUNTER — Ambulatory Visit: Payer: Medicaid Other | Admitting: Student in an Organized Health Care Education/Training Program

## 2017-11-09 ENCOUNTER — Telehealth: Payer: Self-pay | Admitting: Family Medicine

## 2017-11-09 NOTE — Telephone Encounter (Signed)
Pt is requesting pain medicine for pain in her lower back and right heel  [facility]  [place]

## 2017-11-10 NOTE — Telephone Encounter (Signed)
Patient called and did not pick up, plan is specifically to not give narcotic medications.   Ortho may make that choice if they believe it is appropriate.  My plan is continue with OTC meds/ice/and the stretchingrolling techniques I showed her for plantar fascitis.

## 2017-11-10 NOTE — Telephone Encounter (Signed)
Pt returned dr blands call

## 2017-11-13 NOTE — Telephone Encounter (Signed)
Patient scheduled to see Dr. Mosetta PuttFeng tommorow, I'll trust her exam for appropriate pain regimen

## 2017-11-14 ENCOUNTER — Other Ambulatory Visit: Payer: Self-pay | Admitting: Student in an Organized Health Care Education/Training Program

## 2017-11-14 ENCOUNTER — Encounter: Payer: Self-pay | Admitting: Student in an Organized Health Care Education/Training Program

## 2017-11-14 ENCOUNTER — Other Ambulatory Visit: Payer: Self-pay

## 2017-11-14 ENCOUNTER — Ambulatory Visit: Payer: Medicaid Other | Admitting: Student in an Organized Health Care Education/Training Program

## 2017-11-14 VITALS — BP 120/77 | Temp 98.1°F | Ht 65.0 in | Wt 188.0 lb

## 2017-11-14 DIAGNOSIS — G8929 Other chronic pain: Secondary | ICD-10-CM

## 2017-11-14 DIAGNOSIS — M545 Low back pain: Secondary | ICD-10-CM

## 2017-11-14 DIAGNOSIS — G894 Chronic pain syndrome: Secondary | ICD-10-CM | POA: Diagnosis not present

## 2017-11-14 DIAGNOSIS — Z113 Encounter for screening for infections with a predominantly sexual mode of transmission: Secondary | ICD-10-CM

## 2017-11-14 LAB — POCT WET PREP (WET MOUNT)
Clue Cells Wet Prep Whiff POC: NEGATIVE
TRICHOMONAS WET PREP HPF POC: ABSENT

## 2017-11-14 MED ORDER — BACLOFEN 10 MG PO TABS: 10.0000 mg | ORAL_TABLET | Freq: Every day | ORAL | 0 refills | Status: DC | PRN

## 2017-11-14 NOTE — Assessment & Plan Note (Addendum)
Routine STI screening with GC/chlamydia, wet prep which was negative for trichomonas, BV or yeast, and then blood testing for HIV and syphilis.  Also tested hep C given patient reports that her husband has hep C.  Patient asked if she had any bugs on her, I did not see any bugs.

## 2017-11-14 NOTE — Assessment & Plan Note (Signed)
Patient reports continued symptoms with her chronic back pain.  She has no red flags by history or exam.  She feels that normal Tylenol is not enough for her.  Recommended that she continue NSAIDs, heat, we can try baclofen in the place of Robaxin.  Encouraged her not to take Robaxin and baclofen at the same time, she reports that she has no excellent at home anyway.  Encouraged her not to take baclofen prior to driving as it can increase drowsiness.  She voices understanding.  Follow-up with PCP for further management of chronic pain.  -  she did ask for opioid medication repeatedly during the visit, which I report it would be not be good care for me to prescribe for this type of pain.

## 2017-11-14 NOTE — Patient Instructions (Addendum)
It was a pleasure seeing you today in our clinic. Today we discussed your sexually transmitted infection screening. Here is the treatment plan we have discussed and agreed upon together:  For your back pain, please continue ibuprofen and heat. I sent a muscle relaxer to your pharmacy which you SHOULD NOT take before driving because it can cause drowsiness.  We drew blood work at today's visit. I will call or send you a letter with these results. If you do not hear from me within the next week, please give our office a call.  Our clinic's number is [phone]. Please call with questions or concerns about what we discussed today.  Be well, Dr. Mosetta PuttFeng

## 2017-11-14 NOTE — Progress Notes (Signed)
CC: STI check  HPI: Bridget [name] is a 57 y.o. female presenting with vaginal itching, asks for STI check  Vaginal itching - no discharge - has used coconut oil for vaginal pruritis - no vaginal bleeding - No burning with urination, no urinary frequency or urgency - no sores or lesions - unsure if this is similar to previous yeast symptoms - Husband has hep C and she would like testing for this - Sexually active with one female partner for the past 20 years. - She asks me to check if there are "bugs" in her vagina. She states she has never had this before however with pruritis she has been concerned.  Back pain - chronic. Was previously seen in pain clinic but no longer goes there. Pain is unchanged from previous. - numbness/tingling across top of toe - has previously taken robaxin, ibuprofen, heat, ice - no loss of feces, has had urinary incontinence  - no IVDA - no fevers, no night sweats - patient does have lumbar spondylosis and degenerative disc disease causing mild impingement of L3-4 and L4-5, and a borderline impingement at L5-S1. - no weakness, no falls  Odd behavior Patient exhibits odd behavior during her visit. She pulls paper off of the exam table and puts it all over the floor. She tried to undress herself by taking her shirt off while the CMA was in the room, stating that she had urine on her shirt. Her affect was a little off during the visit, but unclear why.  Review of Symptoms:  See HPI for ROS.   CC, SH/smoking status, and VS noted.  Objective: BP 120/77 (BP Location: Left Arm, Patient Position: Sitting, Cuff Size: Large)   Temp 98.1 F (36.7 C) (Oral)   Ht 5\' 5"  (1.651 m)   Wt 188 lb (85.3 kg)   LMP  (LMP Unknown)   SpO2 93%   BMI 31.28 kg/m  GEN: NAD, alert, cooperative, and pleasant. Back Exam:  Inspection: Unremarkable  Palpable tenderness: None. Range of Motion:  Flexion 45 deg; Extension 45 deg; Side Bending to 45 deg bilaterally; Rotation  to 45 deg bilaterally  Leg strength: Quad: 5/5 Hamstring: 5/5 Hip flexor: 5/5 Hip abductors: 5/5  Strength at foot: Plantar-flexion: 5/5 Dorsi-flexion: 5/5 Eversion: 5/5 Inversion: 5/5  Sensory change: Gross sensation intact to all lumbar and sacral dermatomes.  Gait unremarkable. SKIN: warm and dry, no rashes or lesions NEURO: II-XII grossly intact PSYCH: AAOx3, odd affect Female genitalia: not done normal external genitalia, vulva, vagina, cervix, uterus and adnexa  Assessment and plan:  Routine screening for STI (sexually transmitted infection) Routine STI screening with GC/chlamydia, wet prep which was negative for trichomonas, BV or yeast, and then blood testing for HIV and syphilis.  Also tested hep C given patient reports that her husband has hep C.  Patient asked if she had any bugs on her, I did not see any bugs.  Chronic pain syndrome Patient reports continued symptoms with her chronic back pain.  She has no red flags by history or exam.  She feels that normal Tylenol is not enough for her.  Recommended that she continue NSAIDs, heat, we can try baclofen in the place of Robaxin.  Encouraged her not to take Robaxin and baclofen at the same time, she reports that she has no excellent at home anyway.  Encouraged her not to take baclofen prior to driving as it can increase drowsiness.  She voices understanding.  Follow-up with PCP for further management  of chronic pain.  -  she did ask for opioid medication repeatedly during the visit, which I report it would be not be good care for me to prescribe for this type of pain.     Orders Placed This Encounter  Procedures  . RPR  . HIV antibody (with reflex)  . Hepatitis C antibody  . POCT Wet Prep [facility])    Meds ordered this encounter  Medications  . baclofen (LIORESAL) 10 MG tablet    Sig: Take 1 tablet (10 mg total) by mouth daily as needed for muscle spasms.    Dispense:  15 each    Refill:  0   Howard Pouch, MD,MS,   PGY2 11/14/2017 5:09 PM

## 2017-11-15 LAB — HIV ANTIBODY (ROUTINE TESTING W REFLEX): HIV SCREEN 4TH GENERATION: NONREACTIVE

## 2017-11-15 LAB — RPR: RPR Ser Ql: NONREACTIVE

## 2017-11-15 LAB — HEPATITIS C ANTIBODY

## 2017-11-18 LAB — GC/CHLAMYDIA PROBE AMP
CHLAMYDIA, DNA PROBE: NEGATIVE
Neisseria gonorrhoeae by PCR: NEGATIVE

## 2017-11-18 LAB — SPECIMEN STATUS REPORT

## 2017-11-23 ENCOUNTER — Encounter: Payer: Self-pay | Admitting: Student in an Organized Health Care Education/Training Program

## 2017-11-24 ENCOUNTER — Encounter: Payer: Self-pay | Admitting: Family Medicine

## 2017-11-27 ENCOUNTER — Other Ambulatory Visit: Payer: Self-pay | Admitting: Student in an Organized Health Care Education/Training Program

## 2017-11-27 DIAGNOSIS — G8929 Other chronic pain: Secondary | ICD-10-CM

## 2017-11-27 DIAGNOSIS — M545 Low back pain: Principal | ICD-10-CM

## 2017-12-19 ENCOUNTER — Other Ambulatory Visit: Payer: Self-pay | Admitting: Family Medicine

## 2017-12-19 ENCOUNTER — Telehealth: Payer: Self-pay

## 2017-12-19 DIAGNOSIS — G8929 Other chronic pain: Secondary | ICD-10-CM

## 2017-12-19 DIAGNOSIS — M545 Low back pain, unspecified: Secondary | ICD-10-CM

## 2017-12-19 NOTE — Telephone Encounter (Signed)
[facility]. Last Rx was dated 03/16/2016 with 6 refills. Sunday SpillersSharon T Breona Cherubin, CMA

## 2017-12-21 ENCOUNTER — Ambulatory Visit: Payer: Medicaid Other | Admitting: Family Medicine

## 2017-12-21 ENCOUNTER — Other Ambulatory Visit: Payer: Self-pay

## 2017-12-21 ENCOUNTER — Encounter: Payer: Self-pay | Admitting: Family Medicine

## 2017-12-21 DIAGNOSIS — J22 Unspecified acute lower respiratory infection: Secondary | ICD-10-CM | POA: Insufficient documentation

## 2017-12-21 HISTORY — DX: Unspecified acute lower respiratory infection: J22

## 2017-12-21 MED ORDER — FLUCONAZOLE 150 MG PO TABS: 150.0000 mg | ORAL_TABLET | Freq: Once | ORAL | 0 refills | Status: AC

## 2017-12-21 MED ORDER — AZITHROMYCIN 250 MG PO TABS: ORAL_TABLET | ORAL | 0 refills | Status: DC

## 2017-12-21 NOTE — Patient Instructions (Signed)
I sent in an antibiotic and something for the yeast infection. Drink lots of fluids. Be careful about standing up too quickly.  Your blood pressure is on the low side and I don't want you passing out. Brilliant work on the weight loss.  I am totally impressed.

## 2017-12-22 ENCOUNTER — Other Ambulatory Visit: Payer: Self-pay | Admitting: Family Medicine

## 2017-12-22 ENCOUNTER — Encounter: Payer: Self-pay | Admitting: Family Medicine

## 2017-12-22 MED ORDER — FLUTICASONE PROPIONATE 50 MCG/ACT NA SUSP: 2.0000 | Freq: Every day | NASAL | 6 refills | Status: DC

## 2017-12-22 NOTE — Assessment & Plan Note (Signed)
Given worsening and seven day duration, will rx with azithromicin.

## 2017-12-22 NOTE — Progress Notes (Signed)
   Subjective:    Patient ID: Bridget [name], female    DOB: 09/17/1961, 57 y.o.   MRN: [id number]  HPI 1 week hx of respiratory illness.  Has been worsening over the past 3 days.  Was hospitalized last fall with pneumonia and this feels similar.  No fever.  +cough productive of yellow sputum.  Mild DOE.    Has had significant intentional wt loss - almost 50 lbs over the past year.  Poor fluid intake with this illness.  Tough to know if on the dry side.      Review of Systems     Objective:   Physical Exam Wt and lowish BP noted.  Not on any diuretics or antihypertensives. Gen non toxic appearing.   HEENT throat mild cobblestone Neck no sig nodes. Lungs normal work of breathing.  Clear bilaterally, no rales.          Assessment & Plan:

## 2018-01-04 ENCOUNTER — Other Ambulatory Visit: Payer: Self-pay | Admitting: Family Medicine

## 2018-01-04 DIAGNOSIS — M545 Low back pain, unspecified: Secondary | ICD-10-CM

## 2018-01-04 DIAGNOSIS — G8929 Other chronic pain: Secondary | ICD-10-CM

## 2018-03-10 IMAGING — DX DG HIP (WITH OR WITHOUT PELVIS) 1V PORT*L*
2 series · 2 of 2 positions shown · non-contrast
Comparison: None.

CLINICAL DATA: Left hip replacement

EXAM:
DG HIP (WITH OR WITHOUT PELVIS) 1V PORT LEFT

[pelvis ap]
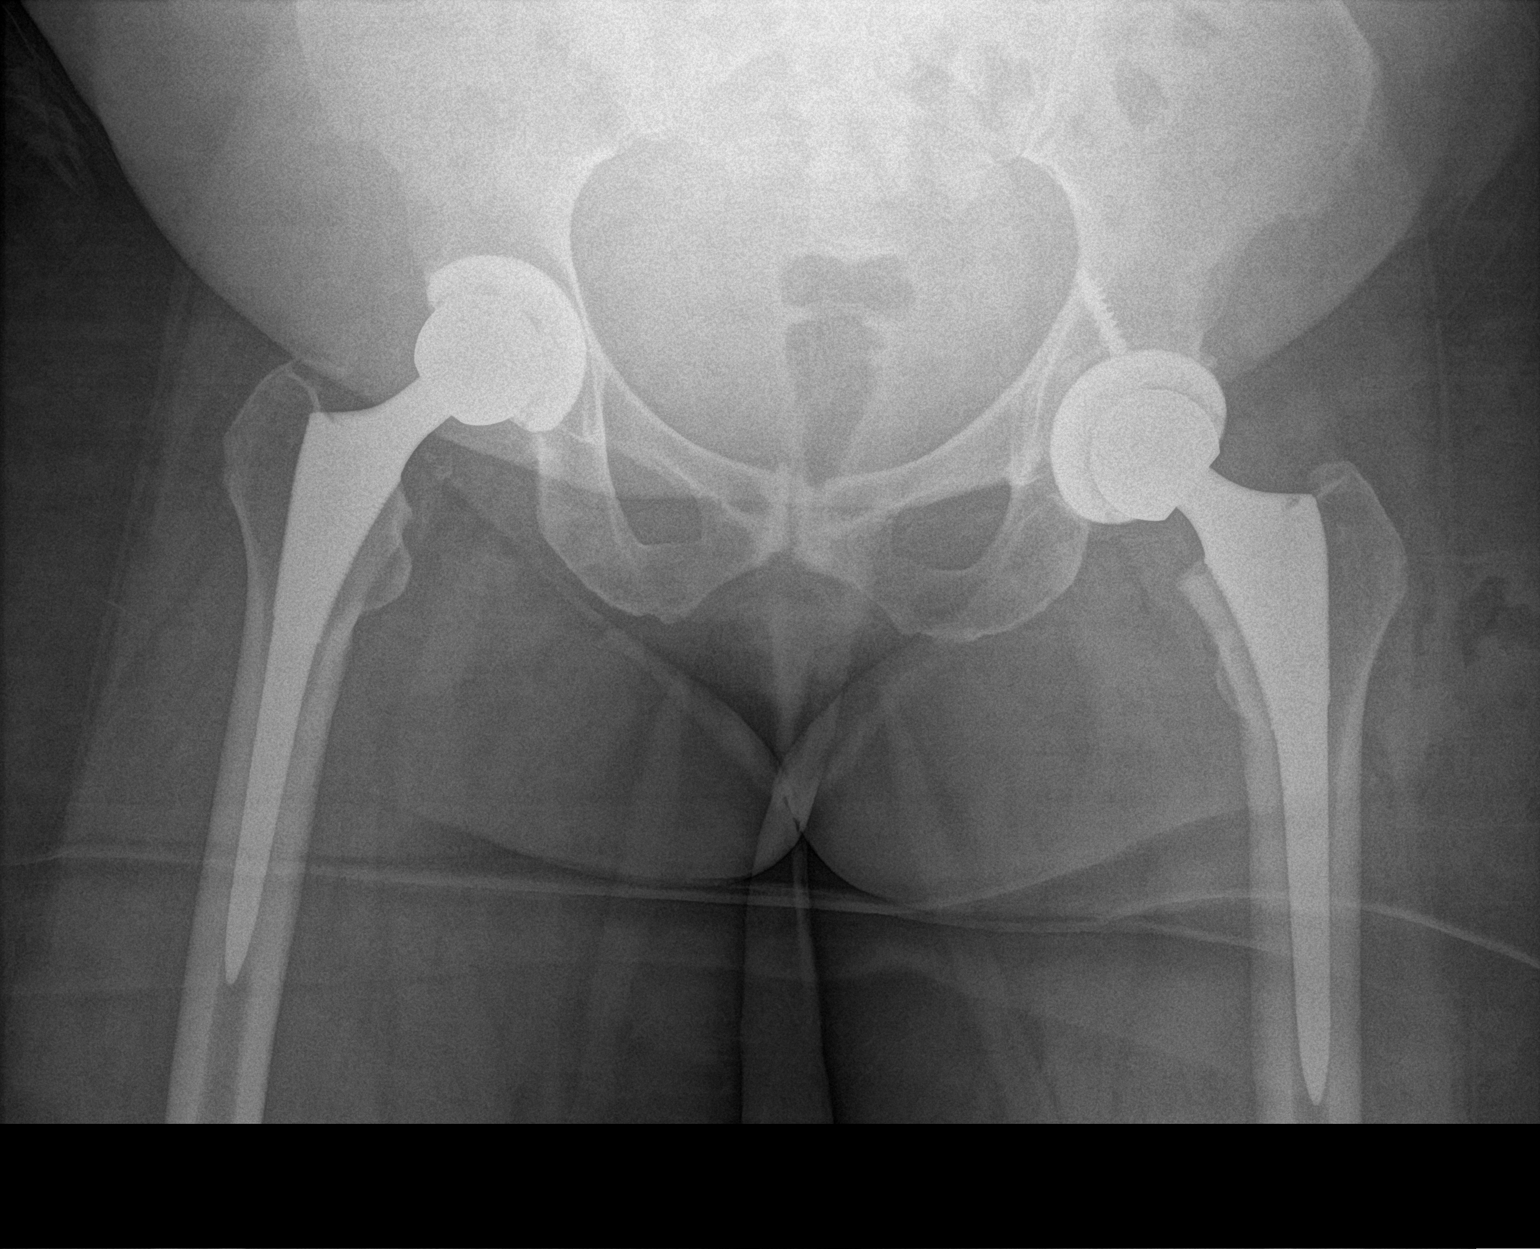

[hip lat]
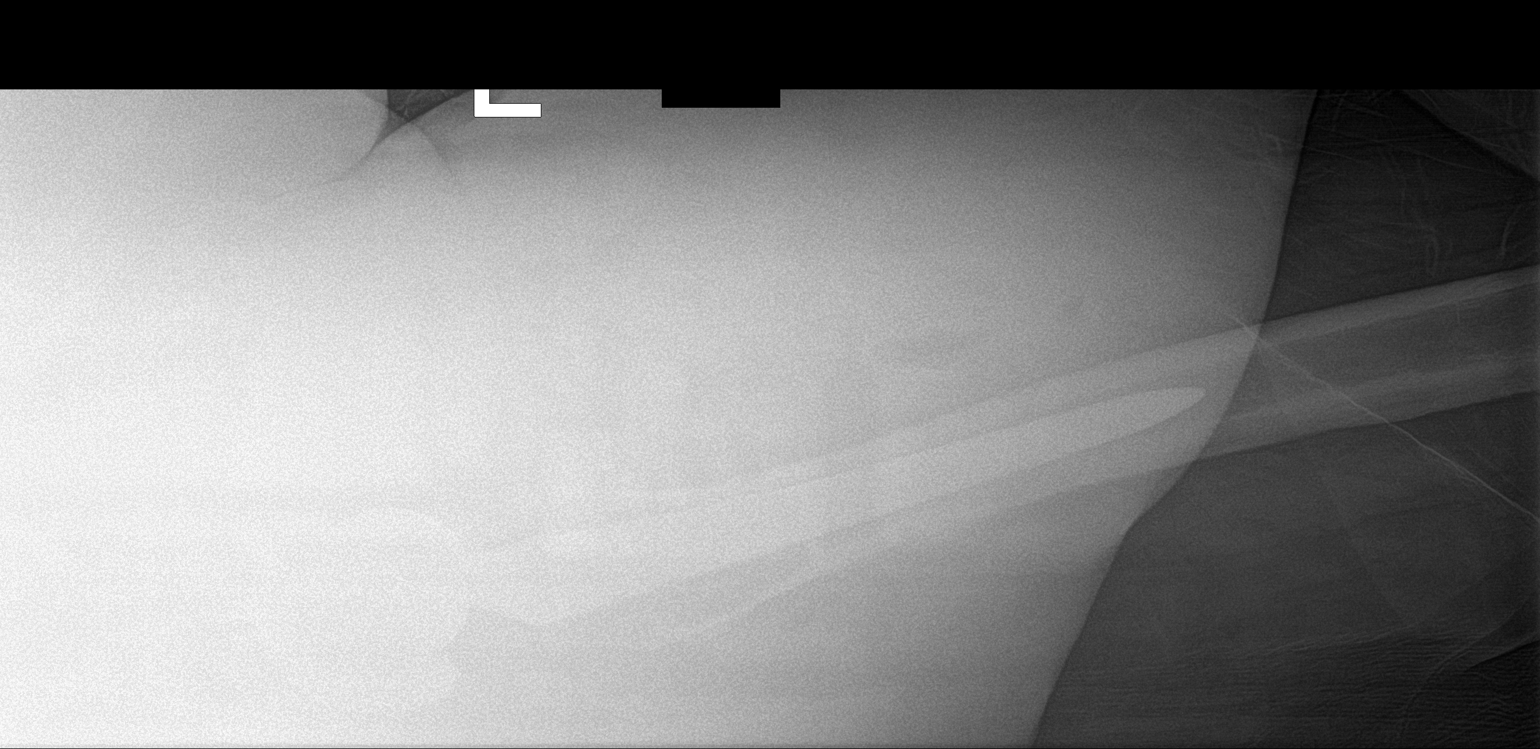

[2 of 2 positions shown; findings below may reference images not displayed]

FINDINGS: Interval total left hip arthroplasty without failure or
complication. No fracture dislocation. Postsurgical changes in the
surrounding soft tissues.

Prior remote right total hip arthroplasty without failure or
complication.
IMPRESSION: Interval left total hip arthroplasty without failure or
complication.

## 2018-05-14 ENCOUNTER — Ambulatory Visit: Payer: Medicaid Other | Admitting: Family Medicine

## 2018-05-30 ENCOUNTER — Other Ambulatory Visit: Payer: Self-pay | Admitting: Neurosurgery

## 2018-05-30 DIAGNOSIS — M4726 Other spondylosis with radiculopathy, lumbar region: Secondary | ICD-10-CM

## 2018-06-05 ENCOUNTER — Ambulatory Visit: Payer: Medicaid Other | Admitting: Family Medicine

## 2018-06-08 ENCOUNTER — Ambulatory Visit: Payer: Medicaid Other | Admitting: Family Medicine

## 2018-06-11 ENCOUNTER — Ambulatory Visit
Admission: RE | Admit: 2018-06-11 | Discharge: 2018-06-11 | Disposition: A | Discharge status: Home / Self Care | Payer: Medicaid Other | Source: Ambulatory Visit | Attending: Neurosurgery | Admitting: Neurosurgery

## 2018-06-11 ENCOUNTER — Ambulatory Visit: Payer: Medicaid Other | Admitting: Family Medicine

## 2018-06-11 ENCOUNTER — Other Ambulatory Visit: Payer: Self-pay

## 2018-06-11 VITALS — BP 100/64 | HR 65 | Temp 98.2°F | Ht 65.0 in | Wt 144.0 lb

## 2018-06-11 DIAGNOSIS — M4726 Other spondylosis with radiculopathy, lumbar region: Secondary | ICD-10-CM

## 2018-06-11 DIAGNOSIS — M79672 Pain in left foot: Secondary | ICD-10-CM | POA: Diagnosis not present

## 2018-06-11 DIAGNOSIS — Z202 Contact with and (suspected) exposure to infections with a predominantly sexual mode of transmission: Secondary | ICD-10-CM | POA: Diagnosis not present

## 2018-06-11 DIAGNOSIS — M79671 Pain in right foot: Secondary | ICD-10-CM | POA: Diagnosis not present

## 2018-06-11 DIAGNOSIS — R634 Abnormal weight loss: Secondary | ICD-10-CM | POA: Diagnosis present

## 2018-06-11 NOTE — Progress Notes (Signed)
    Subjective:  Bridget [name] is a 57 y.o. female who presents to the [facility] today with a chief complaint of wanting referal to ortho for foot pain.   HPI: Patient with chronic foot pain.  Has seen ortho before and was following with them.  She says that she was told she needs another referral because "she used up her visits".  Character/severity of pain has not changed.  3/10, nagging, worse after walking for a long time.  No new injuries/traumas  Weight loss Patient down ~75 lbs in last year.  Some she credits to being more active, some she is concerned is s/p dental surgery which she says has reduced her appetite.  She says she dropped a lot at first but is now "maintaining", when shown her weights continuously declining and asked when the "maintaining" phase started she got concerned and asked for help to maintain her weight.  She is not wanting to keep losing weight.  STD check Patient is sexually active with occasional protection and asks for HIV test.  She has no concerning symptoms curently exept weight loss which has an explainable etiology.   Objective:  Physical Exam: BP 100/64   Pulse 65   Temp 98.2 F (36.8 C) (Oral)   Ht 5\' 5"  (1.651 m)   Wt 144 lb (65.3 kg)   LMP  (LMP Unknown)   SpO2 99%   BMI 23.96 kg/m   Gen: NAD, resting comfortably CV: RRR with no murmurs appreciated Pulm: NWOB, CTAB with no crackles, wheezes, or rhonchi GI: Normal bowel sounds present. Soft, Nontender, Nondistended. MSK: no edema, cyanosis, or clubbing noted, expressed foot pain but no new trauma/abnormality Skin: warm, dry Neuro: grossly normal, moves all extremities Psych: Normal affect and thought content  Results for orders placed or performed in visit on 06/11/18 (from the past 72 hour(s))  HIV antibody (with reflex)     Status: None   Collection Time: 06/11/18  4:12 PM  Result Value Ref Range   HIV Screen 4th Generation wRfx Non Reactive Non Reactive     Assessment/Plan:  Foot  pain, bilateral Patient actually has followup MRI scheduled with ortho already for pain, will continue that workup with them  Possible exposure to STD Patient requested HIV test, (result neg)  Loss of weight ~75lbs in last year, activity increase was intentional but worried about oral intake s/p dental surgery.  Will refer to nutrition to help with maintenance plan   Marthenia Rolling, DO FAMILY MEDICINE RESIDENT - PGY2 06/13/2018 7:22 AM

## 2018-06-11 NOTE — Patient Instructions (Signed)
It was a pleasure to see you today! Thank you for choosing [facility] for your primary care. Bridget [name] was seen for ortho/nutrition referal and HIV check. Come back to the clinic if you have any routine requests, and go to the emergency room if you have any life threatening symptoms.  Today we referred you to nutrition, we'd like you to keep a food/weight diary to help inform them of your progress.  We also called your orthopedic doctor and  You have an appt on Wednesday.   If we did any lab work today that did not result today, one of two things will happen.  1. If everything is normal, you will get a letter in mail sent to the address in your chart with the results for your records.  It is important to keep your address up to date as that is where we will send results.  2. If the results require some sort of discussion, my nurses or myself will call you on the phone number listed in your records.  It is important to keep your phone number up to date in our system as this is how we will try to reach you.  If we cannot reach you on the phone, we will try to send you a letter in the mail so please enable to voicemail function of your phone.  If you don't hear from [place] in two weeks, please give [place] a call to verify your results. Otherwise, we look forward to seeing you again at your next visit. If you have any questions or concerns before then, please call the clinic at [phone].   Please bring all your medications to every doctors visit   Sign up for My Chart to have easy access to your labs results, and communication with your Primary care physician.     Please check-out at the front desk before leaving the clinic.     Best,  Dr. Marthenia Rolling FAMILY MEDICINE RESIDENT - PGY2 06/11/2018 4:10 PM

## 2018-06-12 LAB — HIV ANTIBODY (ROUTINE TESTING W REFLEX): HIV Screen 4th Generation wRfx: NONREACTIVE

## 2018-06-13 ENCOUNTER — Telehealth: Payer: Self-pay

## 2018-06-13 DIAGNOSIS — Z202 Contact with and (suspected) exposure to infections with a predominantly sexual mode of transmission: Secondary | ICD-10-CM | POA: Insufficient documentation

## 2018-06-13 DIAGNOSIS — R634 Abnormal weight loss: Secondary | ICD-10-CM

## 2018-06-13 HISTORY — DX: Abnormal weight loss: R63.4

## 2018-06-13 HISTORY — DX: Contact with and (suspected) exposure to infections with a predominantly sexual mode of transmission: Z20.2

## 2018-06-13 NOTE — Assessment & Plan Note (Signed)
Patient requested HIV test, (result neg)

## 2018-06-13 NOTE — Telephone Encounter (Signed)
Pt informed. Pt requested results to be mailed to her. I have printed and put in outgoing mail. Sunday Spillers, CMA

## 2018-06-13 NOTE — Telephone Encounter (Signed)
-----   Message from Scott Bland, DO sent at 06/13/2018  7:24 AM EDT ----- Please tell patient HIV was negative (no indication of HIV infection from this test) 

## 2018-06-13 NOTE — Assessment & Plan Note (Signed)
~  75lbs in last year, activity increase was intentional but worried about oral intake s/p dental surgery.  Will refer to nutrition to help with maintenance plan

## 2018-06-13 NOTE — Assessment & Plan Note (Signed)
Patient actually has followup MRI scheduled with ortho already for pain, will continue that workup with them

## 2018-06-13 NOTE — Telephone Encounter (Signed)
-----   Message from Marthenia Rolling, DO sent at 06/13/2018  7:24 AM EDT ----- Please tell patient HIV was negative (no indication of HIV infection from this test)

## 2018-06-14 ENCOUNTER — Telehealth: Payer: Self-pay | Admitting: Family Medicine

## 2018-06-14 NOTE — Telephone Encounter (Signed)
Pt to let Dr. Parke SimmersBland know about her wt ck today. Pt rsaid she weighed 143 on Monday and today she weighed 137.6. Please call pt with any concerns/questions

## 2018-06-14 NOTE — Telephone Encounter (Signed)
Contacted pt and scheduled appointment for 06/22/18 with Dr. Linwood Dibblesumball. Lamonte SakaiZimmerman Rumple, Carsynn Bethune D, [place]

## 2018-06-20 NOTE — Progress Notes (Deleted)
  Subjective:   Patient ID: Bridget LovelessAlice F Visscher    DOB: 08/27/1961, 57 y.o. female   MRN: [id number]  Bridget Lovelesslice F Muraski is a 57 y.o. female with a history of OA, schizoaffective d/o, bipolar d/o, fibromyalgia,  here for   Weight loss - ~75lb weight loss in the last year with 32lb loss since March 2019. Seen by Dr. Parke SimmersBland 06/11/18, referred to nutrition given concern s/p dental surgery. - never had colonoscopy.  - overdue for mammogram - pap smear UTD and negative for malignancy - difficulty eating?***  Review of Systems:  Per HPI.  PMFSH, medications and smoking status reviewed.  Objective:   LMP  (LMP Unknown)  Vitals and nursing note reviewed.  General: well nourished, well developed, in no acute distress with non-toxic appearance HEENT: normocephalic, atraumatic, moist mucous membranes Neck: supple, non-tender without lymphadenopathy CV: regular rate and rhythm without murmurs, rubs, or gallops, no lower extremity edema Lungs: clear to auscultation bilaterally with normal work of breathing Abdomen: soft, non-tender, non-distended, no masses or organomegaly palpable, normoactive bowel sounds Skin: warm, dry, no rashes or lesions Extremities: warm and well perfused, normal tone MSK: ROM grossly intact, strength intact, gait normal Neuro: Alert and oriented, speech normal  Assessment & Plan:   No problem-specific Assessment & Plan notes found for this encounter.  No orders of the defined types were placed in this encounter.  No orders of the defined types were placed in this encounter.   Ellwood DenseAlison Rumball, DO PGY-2, [facility] 06/20/2018 5:55 PM

## 2018-06-22 ENCOUNTER — Ambulatory Visit: Payer: Medicaid Other | Admitting: Family Medicine

## 2018-06-27 ENCOUNTER — Ambulatory Visit: Payer: Medicaid Other | Admitting: Registered"

## 2018-07-04 ENCOUNTER — Ambulatory Visit: Payer: Medicaid Other | Admitting: Family Medicine

## 2018-07-31 ENCOUNTER — Other Ambulatory Visit: Payer: Self-pay | Admitting: Family Medicine

## 2018-07-31 DIAGNOSIS — M545 Low back pain, unspecified: Secondary | ICD-10-CM

## 2018-07-31 DIAGNOSIS — G8929 Other chronic pain: Secondary | ICD-10-CM

## 2018-09-04 ENCOUNTER — Telehealth: Payer: Self-pay | Admitting: Family Medicine

## 2018-09-04 NOTE — Telephone Encounter (Signed)
Attempted to contact pt to discuss overdue colonoscopy and their options. Unable to reach and was sent to voicemail. -CH

## 2018-10-08 ENCOUNTER — Ambulatory Visit: Payer: Medicaid Other | Admitting: Family Medicine

## 2018-10-08 ENCOUNTER — Encounter: Payer: Self-pay | Admitting: Family Medicine

## 2018-10-08 ENCOUNTER — Other Ambulatory Visit: Payer: Self-pay

## 2018-10-08 VITALS — BP 108/72 | HR 78 | Temp 98.1°F | Ht 61.0 in | Wt 133.0 lb

## 2018-10-08 DIAGNOSIS — G894 Chronic pain syndrome: Secondary | ICD-10-CM | POA: Diagnosis not present

## 2018-10-08 DIAGNOSIS — R7303 Prediabetes: Secondary | ICD-10-CM | POA: Diagnosis not present

## 2018-10-08 DIAGNOSIS — R7309 Other abnormal glucose: Secondary | ICD-10-CM

## 2018-10-08 DIAGNOSIS — Z1231 Encounter for screening mammogram for malignant neoplasm of breast: Secondary | ICD-10-CM

## 2018-10-08 DIAGNOSIS — R634 Abnormal weight loss: Secondary | ICD-10-CM

## 2018-10-08 HISTORY — DX: Prediabetes: R73.03

## 2018-10-08 LAB — POCT GLYCOSYLATED HEMOGLOBIN (HGB A1C): Hemoglobin A1C: 5.4 % (ref 4.0–5.6)

## 2018-10-08 MED ORDER — ENSURE COMPLETE SHAKE PO LIQD: 1.0000 | Freq: Every day | ORAL | 1 refills | Status: DC | PRN

## 2018-10-08 MED ORDER — IBUPROFEN 600 MG PO TABS: 600.0000 mg | ORAL_TABLET | Freq: Every day | ORAL | 0 refills | Status: DC | PRN

## 2018-10-08 NOTE — Patient Instructions (Signed)
It was a pleasure to see you today! Thank you for choosing [facility] for your primary care. Bridget [name] was seen for unintentional weight loss.  Today we took a number of labs to try and check into the reason for your unintentional weight loss.  We have also sent referrals to a speech therapist to investigate your swallowing problems, and nutrition to help with your dietary needs, and prescribed you Ensure which should help increase your calories.  We have also prescribed you a mammogram so he should hear from the breast imaging center within a week.  And as you tell us that you got your colonoscopy with [facility] here in town we need you to sign the release of information so that we can get the results of that.  If we did any lab work today that did not result today, one of two things will happen.  1. If everything is normal, you will get a letter in mail sent to the address in your chart with the results for your records.  It is important to keep your address up to date as that is where we will send results.  2. If the results require some sort of discussion, my nurses or myself will call you on the phone number listed in your records.  It is important to keep your phone number up to date in our system as this is how we will try to reach you.  If we cannot reach you on the phone, we will try to send you a letter in the mail so please enable to voicemail function of your phone.  If you don't hear from us in two weeks, please give us a call to verify your results. Otherwise, we look forward to seeing you again at your next visit. If you have any questions or concerns before then, please call the clinic at [phone].   Please bring all your medications to every doctors visit   Sign up for My Chart to have easy access to your labs results, and communication with your Primary care physician.     Please check-out at the front desk before leaving the clinic.     Best,  Dr. Marthenia RollingScott  [place] [name] FAMILY MEDICINE RESIDENT - PGY2 10/08/2018 2:10 PM

## 2018-10-08 NOTE — Progress Notes (Signed)
Subjective:  Bridget [name] is a 58 y.o. female who presents to the [facility] today with a chief complaint of unintentional weight loss.   HPI: Patient with history of significant unintentional weight loss over the last year and a half, down into the 130s from up over 200.  She said that she did have a significant dental surgery and since then she has had trouble with her partials and with her swallowing.  She says that this made it difficult for her to eat and her appetite has dropped significantly since then because she is not confident that she is able to swallow accurately.  We discussed my concern of potential malignancy and a desire to do a more complete work-up although we are happy that she has plateaued and is been able to stay in the 130s since her last ED visit.  She understands and is willing to undergo work-up but does not feel that that is what is going on because she said she feels well otherwise.  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   Objective:  Physical Exam: BP 108/72   Pulse 78   Temp 98.1 F (36.7 C) (Oral)   Ht 5\' 1"  (1.549 m)   Wt 133 lb (60.3 kg)   LMP  (LMP Unknown)   SpO2 99%   BMI 25.13 kg/m   Gen: NAD, resting comfortably with exception of back pain CV: RRR with no murmurs appreciated Pulm: NWOB, CTAB with no crackles, wheezes, or rhonchi GI: Normal bowel sounds present. Soft, Nontender, Nondistended. MSK: no edema, cyanosis, or clubbing noted.  She does express back pain and repositions multiple times during the visit although there is no objective distal neurological findings. Skin: warm, dry Neuro: grossly normal, moves all extremities Psych: Normal affect and thought content  Results for orders placed or performed in visit on 10/08/18 (from the past 72 hour(s))  HgB A1c     Status: None   Collection Time: 10/08/18  1:32 PM  Result Value Ref Range   Hemoglobin A1C 5.4 4.0 - 5.6 %   HbA1c POC (<> result, manual entry)     HbA1c,  POC (prediabetic range)     HbA1c, POC (controlled diabetic range)       Assessment/Plan:  Screening mammogram, encounter for Patient overdue for mammogram, no symptoms at this point but particularly given concern of unintentional weight loss we stressed the importance of following through with this  Mammogram ordered  Chronic pain syndrome Patient with chronic pain syndrome.  She said that she did not like gabapentin because she felt it did make her feel good.  She has been using ibuprofen 600 and baclofen which she says are both acceptable to her.  Given a prior history of mild asymptomatic anemia we did discuss symptoms and signs of gastro bleeding which she says have not been happening, and we cautioned her to not be using ibuprofen beyond prescribed doses and that if we found bleeding or significant anemia would advise her to stop it.  She understands  Ibuprofen and baclofen refilled  Unintentional weight loss With 2 leading etiology suspected and no specific guarantee of which is going on we have multiple work-ups going on at the same time.  Due to complaint of lack of confidence in eating and swallowing, amatory referral to speech therapy and to nutrition we are also ordering Ensure because she says that she can drink okay and this should help increase her calorie count.  In order to evaluate potential for malignancy or metabolic causes, we have ordered a TSH CBC with differential celiac panel.  She was listed in epic is being overdue for colonoscopy but says this was done in the last few months [facility] that everything was fine, vascular felt a release of information in order to get this sent to [place] for review.  A1c has improved to 5.4 from 5.7 the last time it was checked diabetes is not thought to be contributory  Prediabetes Prior A1c was 5.7 now down to 5.4 discussed with patient   Marthenia Rolling, DO FAMILY MEDICINE RESIDENT - PGY2 10/09/2018 7:27 AM

## 2018-10-09 ENCOUNTER — Other Ambulatory Visit: Payer: Self-pay | Admitting: Family Medicine

## 2018-10-09 DIAGNOSIS — Z1231 Encounter for screening mammogram for malignant neoplasm of breast: Secondary | ICD-10-CM | POA: Insufficient documentation

## 2018-10-09 DIAGNOSIS — R7989 Other specified abnormal findings of blood chemistry: Secondary | ICD-10-CM

## 2018-10-09 DIAGNOSIS — R634 Abnormal weight loss: Secondary | ICD-10-CM

## 2018-10-09 HISTORY — DX: Abnormal weight loss: R63.4

## 2018-10-09 HISTORY — DX: Encounter for screening mammogram for malignant neoplasm of breast: Z12.31

## 2018-10-09 LAB — CBC WITH DIFFERENTIAL/PLATELET
BASOS ABS: 0.1 10*3/uL (ref 0.0–0.2)
Basos: 1 %
EOS (ABSOLUTE): 0.1 10*3/uL (ref 0.0–0.4)
Eos: 2 %
HEMOGLOBIN: 11.4 g/dL (ref 11.1–15.9)
Hematocrit: 34.1 % (ref 34.0–46.6)
IMMATURE GRANS (ABS): 0 10*3/uL (ref 0.0–0.1)
IMMATURE GRANULOCYTES: 0 %
LYMPHS: 33 %
Lymphocytes Absolute: 2.3 10*3/uL (ref 0.7–3.1)
MCH: 28.8 pg (ref 26.6–33.0)
MCHC: 33.4 g/dL (ref 31.5–35.7)
MCV: 86 fL (ref 79–97)
Monocytes Absolute: 0.6 10*3/uL (ref 0.1–0.9)
Monocytes: 9 %
NEUTROS PCT: 55 %
Neutrophils Absolute: 3.8 10*3/uL (ref 1.4–7.0)
Platelets: 281 10*3/uL (ref 150–450)
RBC: 3.96 x10E6/uL (ref 3.77–5.28)
RDW: 13.4 % (ref 11.7–15.4)
WBC: 6.9 10*3/uL (ref 3.4–10.8)

## 2018-10-09 LAB — CELIAC DISEASE ANTIBODY SCREEN
Antigliadin Abs, IgA: 2 units (ref 0–19)
IgA/Immunoglobulin A, Serum: 81 mg/dL — ABNORMAL LOW (ref 87–352)

## 2018-10-09 LAB — COMPREHENSIVE METABOLIC PANEL
ALBUMIN: 4.4 g/dL (ref 3.5–5.5)
ALK PHOS: 62 IU/L (ref 39–117)
ALT: 12 IU/L (ref 0–32)
AST: 17 IU/L (ref 0–40)
Albumin/Globulin Ratio: 2.2 (ref 1.2–2.2)
BUN/Creatinine Ratio: 14 (ref 9–23)
BUN: 10 mg/dL (ref 6–24)
Bilirubin Total: 0.2 mg/dL (ref 0.0–1.2)
CALCIUM: 9.3 mg/dL (ref 8.7–10.2)
CO2: 22 mmol/L (ref 20–29)
CREATININE: 0.73 mg/dL (ref 0.57–1.00)
Chloride: 105 mmol/L (ref 96–106)
GFR calc Af Amer: 106 mL/min/{1.73_m2} (ref >59)
GFR calc non Af Amer: 92 mL/min/{1.73_m2} (ref >59)
GLUCOSE: 82 mg/dL (ref 65–99)
Globulin, Total: 2 g/dL (ref 1.5–4.5)
Potassium: 3.9 mmol/L (ref 3.5–5.2)
Sodium: 142 mmol/L (ref 134–144)
TOTAL PROTEIN: 6.4 g/dL (ref 6.0–8.5)

## 2018-10-09 LAB — TSH: TSH: 0.383 u[IU]/mL — ABNORMAL LOW (ref 0.450–4.500)

## 2018-10-09 NOTE — Assessment & Plan Note (Signed)
With 2 leading etiology suspected and no specific guarantee of which is going on we have multiple work-ups going on at the same time.  Due to complaint of lack of confidence in eating and swallowing, amatory referral to speech therapy and to nutrition we are also ordering Ensure because she says that she can drink okay and this should help increase her calorie count.  In order to evaluate potential for malignancy or metabolic causes, we have ordered a TSH CBC with differential celiac panel.  She was listed in epic is being overdue for colonoscopy but says this was done in the last few months [facility] that everything was fine, vascular felt a release of information in order to get this sent to [place] for review.  A1c has improved to 5.4 from 5.7 the last time it was checked diabetes is not thought to be contributory

## 2018-10-09 NOTE — Progress Notes (Signed)
T3, free T4 ordered because of low TSH from unintentional weight loss workup  -Dr. Parke Simmers

## 2018-10-09 NOTE — Assessment & Plan Note (Signed)
Prior A1c was 5.7 now down to 5.4 discussed with patient

## 2018-10-09 NOTE — Assessment & Plan Note (Signed)
Patient overdue for mammogram, no symptoms at this point but particularly given concern of unintentional weight loss we stressed the importance of following through with this  Mammogram ordered

## 2018-10-09 NOTE — Assessment & Plan Note (Signed)
Patient with chronic pain syndrome.  She said that she did not like gabapentin because she felt it did make her feel good.  She has been using ibuprofen 600 and baclofen which she says are both acceptable to her.  Given a prior history of mild asymptomatic anemia we did discuss symptoms and signs of gastro bleeding which she says have not been happening, and we cautioned her to not be using ibuprofen beyond prescribed doses and that if we found bleeding or significant anemia would advise her to stop it.  She understands  Ibuprofen and baclofen refilled

## 2018-10-10 ENCOUNTER — Telehealth: Payer: Self-pay | Admitting: Family Medicine

## 2018-10-10 NOTE — Telephone Encounter (Signed)
Updates unintentional weight loss work-up.  TSH was found to be low, free T4 and T3 have been ordered as an add-on and lab is communicated this to [facility].  Molly Maduro will be following to ensure that this is able to be drawn, verbal orders have been left to have CMA Sheri call patient and have her come in for lab draw for T3 and T4 if [facility] is unable to perform this test.  CMA is aware.  As I will be gone for a few weeks I have called the patient and informed her of this process so that she is able to follow-up in case she does not hear anything back.  If T3 or T4 are elevated or normal, please consider radioactive iodine uptake scanning of thyroid or communicate these findings to either Dr. Manson Passey or the resident covering my in box for that week.  Dr. Manson Passey was preceptor for this work-up and will be informed of these plans.  -Dr. Parke Simmers

## 2018-10-12 ENCOUNTER — Encounter: Payer: Self-pay | Admitting: Family Medicine

## 2018-10-12 DIAGNOSIS — E038 Other specified hypothyroidism: Secondary | ICD-10-CM

## 2018-10-12 HISTORY — DX: Other specified hypothyroidism: E03.8

## 2018-10-12 LAB — T3, FREE: T3 FREE: 2.7 pg/mL (ref 2.0–4.4)

## 2018-10-12 LAB — T4, FREE: Free T4: 1.15 ng/dL (ref 0.82–1.77)

## 2018-10-16 ENCOUNTER — Telehealth: Payer: Self-pay | Admitting: Family Medicine

## 2018-10-16 DIAGNOSIS — R1310 Dysphagia, unspecified: Secondary | ICD-10-CM

## 2018-10-16 DIAGNOSIS — Z96649 Presence of unspecified artificial hip joint: Secondary | ICD-10-CM

## 2018-10-16 NOTE — Telephone Encounter (Signed)
Patient regarding results.  TSH is minimally suppressed T4 and T3 are normal.  Recommend repeating this in 4 to 6 weeks.  Patient needs a follow-up appointment scheduled for this which we discussed.  Dr. Parke SimmersBland and I also received a message from speech-language pathology that they would like a modified barium swallow study.  This is been ordered.  The patient needs to call the number listed below.  The patient is currently out and does not have access to her calendar or pen and paper to write down the number. Let me know if the order needs changed.    Nursing patient will be available tomorrow in the morning and early afternoon.  Please call her to #1.  Schedule an appointment with either me or Dr. Parke SimmersBland in the next 4 to 6 weeks to repeat her thyroid level.  #2 please give her the number to call for acute rehab [phone]), this is the location of the modified barium study per the staff message.  See the staff message below.   Staff message:      Good Morning,   We have received the referral for the above patient. However, after review of the request the patient will need to have a Modified Barium Swallow test (MBS) prior to us providing her treatment. Please reach out to the Acute Rehab at [phone] to schedule an appointment. We will try to follow up on the status and schedule her eval upon completion and recommendation.    If you have any questions or concerns, please let us know. Thank you.

## 2018-10-16 NOTE — Telephone Encounter (Signed)
Returned patients call--- see documentation in orders encounter.

## 2018-10-16 NOTE — Telephone Encounter (Signed)
T3 and T4 were tested and have resulted. Please advise. Sunday SpillersSharon T Markeia Harkless, CMA

## 2018-10-17 ENCOUNTER — Other Ambulatory Visit (HOSPITAL_COMMUNITY): Payer: Self-pay

## 2018-10-17 DIAGNOSIS — R1310 Dysphagia, unspecified: Secondary | ICD-10-CM

## 2018-10-18 ENCOUNTER — Telehealth: Payer: Self-pay | Admitting: Family Medicine

## 2018-10-18 ENCOUNTER — Telehealth: Payer: Self-pay | Admitting: *Deleted

## 2018-10-18 NOTE — Telephone Encounter (Signed)
Her potassium was normal last time we checked it 10/08/18. She should make sure she is drinking plenty of water and stretching her legs, especially before bed. If her cramps are continuing despite this conservative management, she should be seen for an appointment.

## 2018-10-18 NOTE — Telephone Encounter (Signed)
Patient states that she is having leg cramps and wants to know if it is caused by her potassium being low in her last labs. Fleeger, Maryjo Rochester, CMA

## 2018-10-19 NOTE — Addendum Note (Signed)
Addended by: Manson Passey, CARINA on: 10/19/2018 04:47 PM   Modules accepted: Orders

## 2018-10-19 NOTE — Telephone Encounter (Addendum)
Contacted pt and scheduled her an appointment with Dr. Parke SimmersBland in February.  Pt stated that she has an appointment on Jan 22nd with Dr. Dion SaucierLandau and that a referral needs to be placed.  She also is already schedule for the modified barium study. Routing to Dr. Manson PasseyBrown and Dr. Linwood Dibblesumball as well. Lamonte SakaiZimmerman Rumple, Saben Donigan D, [place]

## 2018-10-19 NOTE — Telephone Encounter (Signed)
Referral to Orthopedics placed for upcoming appointment.   Terisa Starrarina Saliah Crisp, MD  Family Medicine Teaching Service

## 2018-10-19 NOTE — Telephone Encounter (Signed)
Pt informed of below. Zimmerman Rumple, Trenae Brunke D, CMA  

## 2018-10-22 NOTE — Telephone Encounter (Signed)
Pt informed. [name], CMA  

## 2018-10-25 ENCOUNTER — Encounter: Payer: Self-pay | Admitting: Registered"

## 2018-10-25 ENCOUNTER — Encounter: Payer: Medicaid Other | Attending: Family Medicine | Admitting: Registered"

## 2018-10-25 DIAGNOSIS — R634 Abnormal weight loss: Secondary | ICD-10-CM | POA: Insufficient documentation

## 2018-10-25 NOTE — Patient Instructions (Addendum)
Perfect Protein bar (peanut butter) is a higher calorie option to help get in some nutrition in the afternoon when you don't feel like eating. High calorie nutritional supplement such as Ensure or Boost could be used for lunch if you are not wanting to eat. Also supplement when you eat a small amount at a meal. For cramps check with your doctor to see if it is safe for you to take a magnesium supplement, Calm is a powdered form that works well for a lot of people.  Protein20 is a another option to get more protein in your diet, use this in place of a bottle of water. Include butter when seasoning foods and use creams for sauce. Consider using whole milk or cream in cooking oats and other foods.

## 2018-10-25 NOTE — Progress Notes (Signed)
Medical Nutrition Therapy:  Appt start time: 1405 end time:  1505.  Assessment:  Primary concerns today: Pt states she wants to make sure she is eating how she should for health and to prevent further weight loss. Per chart patient has had 43% weight loss in 10 months. RD noticed muscle loss in patient's temple area and patient reports that her clavicle area has become bony and she has also noticed muscle loss in legs. Pt states her body started feeling funny when loosing weight so quickly and had burning sensation in stomach.  Wt Readings from Last 4 Encounters:  10/25/2018 (today) 130.8 lb   10/08/18 133 lb (60.3 kg)  06/11/18 144 lb (65.3 kg)  12/21/17 176 lb (79.8 kg)  01/2017 245 lb (prior to hip replacement surgery)   Pt states UBW was 245 lbs before her hip replacement surgery. Pt reports using cane for 15 yrs prior to surgery, has not used it since. Pt reports weight loss slow at first, but came off quick after dental surgery and new dentures has not been able to eat due to difficult swallowing. Pt states has a hard time swallowing meat, will chew it, but spit it out before swallowing. Pt states she does not eat much bread or pasta. Pt reports tomato sauces give heart burn, okay with cream sauce, doesn't really like gravy. Pt states she doesn't like the texture of Ensure too thick to drink, last week was having 1 bottle per day, would freeze it to tolerate texture. Pt states she likes to cook, but usually eats very small portions. Pt states she like salt and sugar, has chips and candy at night.  Relevant medical history: Pt states she has neuropathy, muscle cramps and restless legs, affecting sleep. Pt states she was told she has hyperthyroidism, problem list includes TSH deficiency. Prediabetes: A1c 5.4% down from 5.7%. Once weight and eating normalized, RD may want to address candy consumption, and reevaluate which nutritional supplement is appropriate.  Social: Pt states she had been taking  care of children and grandchildren who she felt disrespected her, now moved in with husband who she has not lived with in 20 yrs, states stress has reduced greatly since this change in July. Pt states 3 days per week she helps out her in-laws and prepares breakfast and eats with them.  Supplement sample provided: Protein20 Lot #:  [id number] Exp: 11.30.2020  Preferred Learning Style:   No preference indicated   Learning Readiness:   Ready  MEDICATIONS: reviewed   DIETARY INTAKE:  24-hr recall:  B ( AM): MWF, grits, eggs, bacon (in laws, provides care) T,TH egg whites, chicken sausage OR small waffle, a little OJ  Snk ( AM): none  L ( PM): 2 hot dogs, coleslaw, buns, 3 french fries Snk ( PM): none D ( PM): kale, collards, apples, baked fish, cranberry sauce OR chili, a few noodles Snk ( PM): honey bbq chips, plain m&ms Beverages: water  Usual physical activity: ADLs - stays very busy  Estimated energy needs: 1600 calories  Progress Towards Goal(s):  New goal.   Nutritional Diagnosis:  NI-1.4 Inadequate energy intake As related to reduced intake after dental surgery.  As evidenced by reported avoidance of eating due to difficulty swallowing and unintended weight loss of 76 lbs in 10 months.    Intervention:  Nutrition Education. Importance of protein in regaining muscle mass. Discussed ways to get more calories in the diet. Discussed role of exercise in health and need for nourishment to  support activity. Discussed different nutritional supplements and strategies to get nourishment when not wanting to eat.  Plan: Perfect Protein bar (peanut butter) is a higher calorie option to help get in some nutrition in the afternoon when you don't feel like eating. High calorie nutritional supplement such as Ensure or Boost could be used for lunch if you are not wanting to eat. Also supplement when you eat a small amount at a meal. For cramps check with your doctor to see if it is  safe for you to take a magnesium supplement, Calm is a powdered form that works well for a lot of people.  Protein20 is a another option to get more protein in your diet, use this in place of a bottle of water. Include butter when seasoning foods and use creams for sauce. Consider using whole milk or cream in cooking oats and other foods  Teaching Method Utilized:  Visual Auditory  Handouts given during visit include:  AND high protein/high calorie nutrition therapy  Barriers to learning/adherence to lifestyle change: financial ability to purchase nutritional supplements  Demonstrated degree of understanding via:  Teach Back   Monitoring/Evaluation:  Dietary intake, exercise, and body weight in 2 month(s).

## 2018-10-30 ENCOUNTER — Ambulatory Visit (HOSPITAL_COMMUNITY): Admission: RE | Admit: 2018-10-30 | Payer: Medicaid Other | Source: Ambulatory Visit

## 2018-10-30 ENCOUNTER — Ambulatory Visit (HOSPITAL_COMMUNITY)
Admission: RE | Admit: 2018-10-30 | Discharge: 2018-10-30 | Disposition: A | Discharge status: Home / Self Care | Payer: Medicaid Other | Source: Ambulatory Visit | Attending: Family Medicine | Admitting: Family Medicine

## 2018-10-30 DIAGNOSIS — R1310 Dysphagia, unspecified: Secondary | ICD-10-CM

## 2018-11-01 ENCOUNTER — Other Ambulatory Visit (HOSPITAL_COMMUNITY): Payer: Self-pay

## 2018-11-01 ENCOUNTER — Telehealth: Payer: Self-pay | Admitting: Family Medicine

## 2018-11-01 ENCOUNTER — Other Ambulatory Visit: Payer: Self-pay | Admitting: Family Medicine

## 2018-11-01 DIAGNOSIS — R1310 Dysphagia, unspecified: Secondary | ICD-10-CM

## 2018-11-01 NOTE — Progress Notes (Signed)
New SLP Barium order placed.   Terisa Starr, MD  Family Medicine Teaching Service

## 2018-11-01 NOTE — Telephone Encounter (Signed)
Returned call to pt and she had forgotten about her appointment.  [facility] with an arrival at 12:45pm. Mailed appointment card to pt and the scheduler that I spoke with Altamese Peabody(Hannah Waddell) will give pt a reminder call a day or two before appointment. Lamonte SakaiZimmerman Rumple, April D, [place]

## 2018-11-01 NOTE — Telephone Encounter (Signed)
Nursing- please reach out to patient regarding Speech Appointment/Barium Swallow---it appears she missed appointment. Please call patient and ask if she needs new appointment for Speech/Barium or additional support in making it to this appointment. Let me know if she has questions. Thanks,  Terisa Starr, MD  [facility] Teaching Service

## 2018-11-01 NOTE — Telephone Encounter (Signed)
Pt is returning April's call and would like for her to call her back.

## 2018-11-01 NOTE — Telephone Encounter (Signed)
LVM to call office back to ask her about the below information. Bridget [name], April D, [place]

## 2018-11-02 ENCOUNTER — Other Ambulatory Visit: Payer: Self-pay | Admitting: Physician Assistant

## 2018-11-02 DIAGNOSIS — D509 Iron deficiency anemia, unspecified: Secondary | ICD-10-CM

## 2018-11-02 DIAGNOSIS — R634 Abnormal weight loss: Secondary | ICD-10-CM

## 2018-11-02 DIAGNOSIS — R1032 Left lower quadrant pain: Secondary | ICD-10-CM

## 2018-11-06 ENCOUNTER — Encounter: Payer: Self-pay | Admitting: Family Medicine

## 2018-11-08 ENCOUNTER — Other Ambulatory Visit: Payer: Medicaid Other

## 2018-11-15 ENCOUNTER — Ambulatory Visit
Admission: RE | Admit: 2018-11-15 | Discharge: 2018-11-15 | Disposition: A | Discharge status: Home / Self Care | Payer: Medicaid Other | Source: Ambulatory Visit | Attending: Physician Assistant | Admitting: Physician Assistant

## 2018-11-15 DIAGNOSIS — R634 Abnormal weight loss: Secondary | ICD-10-CM

## 2018-11-15 DIAGNOSIS — R1032 Left lower quadrant pain: Secondary | ICD-10-CM

## 2018-11-15 DIAGNOSIS — D509 Iron deficiency anemia, unspecified: Secondary | ICD-10-CM

## 2018-11-15 MED ORDER — IOPAMIDOL (ISOVUE-300) INJECTION 61%
100.0000 mL | Freq: Once | INTRAVENOUS | Status: AC | PRN
  Administered 2018-11-15: 100 mL via INTRAVENOUS

## 2018-11-22 ENCOUNTER — Ambulatory Visit (HOSPITAL_COMMUNITY)
Admission: RE | Admit: 2018-11-22 | Discharge: 2018-11-22 | Disposition: A | Discharge status: Home / Self Care | Payer: Medicaid Other | Source: Ambulatory Visit | Attending: Family Medicine | Admitting: Family Medicine

## 2018-11-22 ENCOUNTER — Other Ambulatory Visit: Payer: Self-pay | Admitting: Family Medicine

## 2018-11-22 DIAGNOSIS — R1310 Dysphagia, unspecified: Secondary | ICD-10-CM

## 2018-11-22 DIAGNOSIS — R499 Unspecified voice and resonance disorder: Secondary | ICD-10-CM

## 2018-11-22 NOTE — Progress Notes (Signed)
Referring to ENT.  Patient with hx of smoking and recent weight loss/dysphagia s/p dental procedure now complaining of voice changes to SLP.  -Dr. Parke Simmers

## 2018-11-23 ENCOUNTER — Other Ambulatory Visit: Payer: Self-pay

## 2018-11-23 ENCOUNTER — Ambulatory Visit: Payer: Medicaid Other | Admitting: Family Medicine

## 2018-11-23 ENCOUNTER — Encounter: Payer: Self-pay | Admitting: Family Medicine

## 2018-11-23 VITALS — BP 110/62 | HR 77 | Temp 98.6°F | Ht 61.0 in | Wt 133.2 lb

## 2018-11-23 DIAGNOSIS — R634 Abnormal weight loss: Secondary | ICD-10-CM | POA: Diagnosis present

## 2018-11-23 DIAGNOSIS — F17200 Nicotine dependence, unspecified, uncomplicated: Secondary | ICD-10-CM

## 2018-11-23 DIAGNOSIS — F3181 Bipolar II disorder: Secondary | ICD-10-CM

## 2018-11-23 NOTE — Patient Instructions (Addendum)
It was a pleasure to see you today! Thank you for choosing [facility] for your primary care. Bridget [name] was seen for weight loss.   Today we talked about your work-up for weight loss.  Please continue to follow the nutritionist and the speech therapist.  We have sent in the referral to [facility] ear nose and throat, please call them next week and set up an appointment.  We are also ordering a CT picture of your chest and neck which should help [place] understand if there is concerning swelling going on.  Were going to also put in a referral to psychiatry but you do often not need a referral for this we have included some numbers below for you to call.  Given your prior concerns with Depakote we are going to let your behavioral health folks to start you on your mood stabilizer.  If you begin to feel that you are unsafe or beginning to have thoughts of self-harm please come to the emergency department so that we can look after you.     Please bring all your medications to every doctors visit   Sign up for My Chart to have easy access to your labs results, and communication with your Primary care physician.     Please check-out at the front desk before leaving the clinic.     Best,  Dr. Marthenia Rolling FAMILY MEDICINE RESIDENT - PGY2 11/23/2018 3:52 PM   Psychiatry and Counseling  [facility]  [address]  [place], [place]  [phone]  Psychiatrists  [facility]  Address: [address] [place], [place], [place] [address] Hours:  Phone: [phone]  [facility]  [address]  [place], [place] [address] [place], [place] [address]  [phone]  Dr. Archer Asa [facility]  [address] [place] [address] [place] [address]  [phone]  Andee Poles, MD [facility]  [address] [place] [address] [place] [address]  [phone]  Therapists  [facility]  [address] [place], [place]  [phone]  [facility]  [address] [place] [place] [address] [place], [place]  [phone]  [facility]  [address]  [place], [place] [address] [place], [place] [address]  [phone]  [facility]  [address]  [place], [place] [address] [place], [place] [address]  [phone]

## 2018-11-26 ENCOUNTER — Ambulatory Visit: Payer: Medicaid Other

## 2018-11-27 NOTE — Assessment & Plan Note (Signed)
Patient with significant psychiatry history.  Due to history of bipolar and saying she has had problems with multiple antipsychotics, believe is more appropriate to allow psychiatry to manage his medication.  We discussed concern and the complicated nature of her disease process with her.  Daughter was in the room and they both agree that she is safe at this time.  We reviewed the risk of adding SSRIs without antipsychotic and they understand.

## 2018-11-27 NOTE — Assessment & Plan Note (Addendum)
Exact cause undetermined.  Speech therapist had suggested patient see ENT which referral has been placed for.  Does have significant smoking history.  Thyroid work-up that up unremarkable although we will follow-up for slightly elevated TSH in a few months as T3 and 4 were normal.  CT chest and neck as patient has significant smoking history and is now consenting to this.  Patient had initially been planning weight loss on dental procedure  Weight is stabilized and is actually up 3 pounds since last visit which could be within margin of error.  Patient attributes this to seeing nutrition and being very intentional about eating.

## 2018-11-27 NOTE — Assessment & Plan Note (Signed)
Patient aware the need to quit, but are period oF stress right now and not ready to.  Recent weight loss at CT chest and neck, throat swelling may be salivary glands after recent weight loss

## 2018-11-27 NOTE — Progress Notes (Signed)
Subjective:  Bridget [name] is a 59 y.o. female who presents to the [facility] today with a chief complaint of weight loss.   HPI: Loss of weight Exact cause undetermined.  Speech therapist had suggested patient see ENT Does have significant smoking history.  Thyroid work-up that up unremarkable although we will follow-up for slightly elevated TSH in a few months as T3 and 4 were normal.  Patient had initially been planning weight loss on dental procedure.  Weight is stabilized and is actually up 3 pounds since last visit which could be within margin of error.  Patient attributes this to seeing nutrition and being very intentional about eating.   Bipolar 2 disorder Patient with significant psychiatry history.  Due to history of bipolar and saying she has had problems with multiple antipsychotics, believe is more appropriate to allow psychiatry to manage his medication.  We discussed concern and the complicated nature of her disease process with her.  Daughter was in the room and they both agree that she is safe at this time.  We reviewed the risk of adding SSRIs without antipsychotic and they understand.  TOBACCO DEPENDENCE Patient aware the need to quit, but are period oF stress right now and not ready to.     Objective:  Physical Exam: BP 110/62   Pulse 77   Temp 98.6 F (37 C) (Oral)   Ht 5\' 1"  (1.549 m)   Wt 133 lb 3.2 oz (60.4 kg)   LMP  (LMP Unknown)   SpO2 99%   BMI 25.17 kg/m   Gen: NAD, resting comfortably CV: RRR with no murmurs appreciated *submandibular swelling (may be salivary glands?), no stridor or increased drooling, this is not acute (over past few months per patient) Pulm: NWOB, CTAB with no crackles, wheezes, or rhonchi GI: Normal bowel sounds present. Soft, Nontender, Nondistended. MSK: no edema, cyanosis, or clubbing noted Skin: warm, dry Neuro: grossly normal, moves all extremities Psych: Normal affect and thought content  No results found for this or any  previous visit (from the past 72 hour(s)).   Assessment/Plan:  TOBACCO DEPENDENCE Patient aware the need to quit, but are period oF stress right now and not ready to.  Recent weight loss at CT chest and neck, throat swelling may be salivary glands after recent weight loss  Loss of weight Exact cause undetermined.  Speech therapist had suggested patient see ENT which referral has been placed for.  Does have significant smoking history.  Thyroid work-up that up unremarkable although we will follow-up for slightly elevated TSH in a few months as T3 and 4 were normal.  CT chest and neck as patient has significant smoking history and is now consenting to this.  Patient had initially been planning weight loss on dental procedure  Weight is stabilized and is actually up 3 pounds since last visit which could be within margin of error.  Patient attributes this to seeing nutrition and being very intentional about eating.    Bipolar 2 disorder Patient with significant psychiatry history.  Due to history of bipolar and saying she has had problems with multiple antipsychotics, believe is more appropriate to allow psychiatry to manage his medication.  We discussed concern and the complicated nature of her disease process with her.  Daughter was in the room and they both agree that she is safe at this time.  We reviewed the risk of adding SSRIs without antipsychotic and they understand.   Marthenia Rolling, DO FAMILY MEDICINE RESIDENT -  PGY2 11/27/2018 9:07 AM

## 2018-12-06 DIAGNOSIS — R1313 Dysphagia, pharyngeal phase: Secondary | ICD-10-CM

## 2018-12-06 HISTORY — DX: Dysphagia, pharyngeal phase: R13.13

## 2018-12-07 ENCOUNTER — Encounter: Payer: Self-pay | Admitting: Family Medicine

## 2018-12-07 ENCOUNTER — Other Ambulatory Visit: Payer: Self-pay

## 2018-12-07 ENCOUNTER — Ambulatory Visit: Payer: Medicaid Other | Admitting: Family Medicine

## 2018-12-07 VITALS — BP 106/58 | HR 85 | Temp 98.6°F | Ht 61.0 in | Wt 138.6 lb

## 2018-12-07 DIAGNOSIS — R252 Cramp and spasm: Secondary | ICD-10-CM | POA: Diagnosis not present

## 2018-12-07 DIAGNOSIS — R634 Abnormal weight loss: Secondary | ICD-10-CM | POA: Diagnosis not present

## 2018-12-07 NOTE — Progress Notes (Signed)
    Subjective:  Bridget [name] is a 58 y.o. female who presents to the [facility] today with a chief complaint of weight loss muscle cramping.   HPI: Unintentional weight loss Patient still has not want to go get chest CT as ordered due to her unintentional weight loss and significant smoking history.  We discussed that again she agrees that she will go and get this done soon.  She has been following with speech and is increased her oral intake although her appetite has not increased, she has been viewing this as an assignment and is managed to gain a few more pounds.  Cramp in muscle Occasional cramping that seems to occur with craving for mustard which she says seems to resolve the cramping.  Usually in legs not elsewhere with no concurrent chest or belly pain.    Objective:  Physical Exam: BP (!) 106/58   Pulse 85   Temp 98.6 F (37 C) (Oral)   Ht 5\' 1"  (1.549 m)   Wt 138 lb 9.6 oz (62.9 kg)   LMP  (LMP Unknown)   SpO2 99%   BMI 26.19 kg/m   Gen: NAD, resting comfortably CV: RRR with no murmurs appreciated Pulm: NWOB, CTAB with no crackles, wheezes, or rhonchi GI: Normal bowel sounds present. Soft, Nontender, Nondistended. MSK: no edema, cyanosis, or clubbing noted Skin: warm, dry Neuro: grossly normal, moves all extremities Psych: Normal affect and thought content  No results found for this or any previous visit (from the past 72 hour(s)).   Assessment/Plan:  Unintentional weight loss Patient still has not want to go get chest CT as ordered due to her unintentional weight loss and significant smoking history.  We discussed that again she agrees that she will go and get this done soon.  She has been following with speech and is increased her oral intake although her appetite has not increased, she has been viewing this as an assignment and is managed to gain a few more pounds.  Cramp in muscle Occasional cramping that seems to occur with craving for mustard which she says  seems to resolve the cramping.  Usually in legs not elsewhere with no concurrent chest or belly pain.  Story seems to line up with metabolic issue but BMP has been normal.  We will continue to monitor and she will try to pay attention to the symptoms and activity levels surrounding these events.  We can discuss more next if she comes in   [place], [place] FAMILY MEDICINE RESIDENT - PGY2 12/11/2018 2:26 PM

## 2018-12-08 LAB — BASIC METABOLIC PANEL
BUN/Creatinine Ratio: 16 (ref 9–23)
BUN: 14 mg/dL (ref 6–24)
CALCIUM: 9.4 mg/dL (ref 8.7–10.2)
CO2: 20 mmol/L (ref 20–29)
Chloride: 106 mmol/L (ref 96–106)
Creatinine, Ser: 0.87 mg/dL (ref 0.57–1.00)
GFR calc Af Amer: 86 mL/min/{1.73_m2} (ref >59)
GFR calc non Af Amer: 75 mL/min/{1.73_m2} (ref >59)
Glucose: 86 mg/dL (ref 65–99)
Potassium: 4.2 mmol/L (ref 3.5–5.2)
Sodium: 142 mmol/L (ref 134–144)

## 2018-12-11 ENCOUNTER — Encounter: Payer: Self-pay | Admitting: Family Medicine

## 2018-12-11 DIAGNOSIS — R252 Cramp and spasm: Secondary | ICD-10-CM | POA: Insufficient documentation

## 2018-12-11 HISTORY — DX: Cramp and spasm: R25.2

## 2018-12-11 NOTE — Assessment & Plan Note (Signed)
Occasional cramping that seems to occur with craving for mustard which she says seems to resolve the cramping.  Usually in legs not elsewhere with no concurrent chest or belly pain.  Story seems to line up with metabolic issue but BMP has been normal.  We will continue to monitor and she will try to pay attention to the symptoms and activity levels surrounding these events.  We can discuss more next if she comes in

## 2018-12-11 NOTE — Assessment & Plan Note (Signed)
Patient still has not want to go get chest CT as ordered due to her unintentional weight loss and significant smoking history.  We discussed that again she agrees that she will go and get this done soon.  She has been following with speech and is increased her oral intake although her appetite has not increased, she has been viewing this as an assignment and is managed to gain a few more pounds.

## 2018-12-27 ENCOUNTER — Ambulatory Visit: Payer: Medicaid Other | Admitting: Registered"

## 2019-01-14 ENCOUNTER — Telehealth: Payer: Self-pay | Admitting: Family Medicine

## 2019-01-14 DIAGNOSIS — G8929 Other chronic pain: Secondary | ICD-10-CM

## 2019-01-14 DIAGNOSIS — M545 Low back pain: Principal | ICD-10-CM

## 2019-01-14 MED ORDER — BACLOFEN 10 MG PO TABS: 10.0000 mg | ORAL_TABLET | Freq: Every day | ORAL | 1 refills | Status: DC

## 2019-01-14 NOTE — Telephone Encounter (Signed)
Pt called for pain medication for her back, pt states her back has been giving her problems for 2 weeks. Please give pt a call back.

## 2019-01-14 NOTE — Telephone Encounter (Signed)
Baclofen refilled for chronic back spasms.  -Dr. Parke Simmers

## 2019-01-15 ENCOUNTER — Other Ambulatory Visit: Payer: Self-pay | Admitting: Family Medicine

## 2019-01-15 DIAGNOSIS — G894 Chronic pain syndrome: Secondary | ICD-10-CM

## 2019-01-16 ENCOUNTER — Encounter: Payer: Self-pay | Admitting: Family Medicine

## 2019-01-16 ENCOUNTER — Telehealth: Payer: Self-pay | Admitting: Family Medicine

## 2019-01-16 DIAGNOSIS — M62830 Muscle spasm of back: Secondary | ICD-10-CM

## 2019-01-16 MED ORDER — TIZANIDINE HCL 2 MG PO CAPS: 2.0000 mg | ORAL_CAPSULE | Freq: Three times a day (TID) | ORAL | 0 refills | Status: DC

## 2019-01-16 MED ORDER — NAPROXEN 500 MG PO TABS: 500.0000 mg | ORAL_TABLET | Freq: Two times a day (BID) | ORAL | 0 refills | Status: DC

## 2019-01-16 NOTE — Telephone Encounter (Signed)
[facility] Telemedicine Visit  Bridget [name] is a pleasant 58 year old woman with a history significant for unintentional weight loss, thought to be due to a dental procedure after an extensive work-up.  She reports she has chronic low back pain.  This flares several times a year.  Her back pain is currently flaring.  This is similar to prior episodes.  Today her back pain is located in her low back rating down her left leg.  She denies bowel or bladder incontinence, weakness or numbness in her lower extremities.  She denies fevers or chills.  She reports she has been working on her appetite.  She specifically requests opioids for this.  She has tried baclofen 3 times a day for the last day which has not been working.  She has been taking Tylenol and ibuprofen intermittently.  She has no history of gastric bypass or contraindication to NSAIDs.  Discussed options at length, I recommended against narcotics given their limited efficacy and adverse side effects.  Patient amenable to below plan.  She will call and follow-up if her pain does not improve.  I recommended ice, heat and stretching.  Diagnoses and all orders for this visit:  Back spasm -     tizanidine (ZANAFLEX) 2 MG capsule; Take 1 capsule (2 mg total) by mouth 3 (three) times daily. -     naproxen (NAPROSYN) 500 MG tablet; Take 1 tablet (500 mg total) by mouth 2 (two) times daily with a meal.

## 2019-01-22 ENCOUNTER — Telehealth: Payer: Self-pay | Admitting: Family Medicine

## 2019-01-22 ENCOUNTER — Ambulatory Visit: Payer: Medicaid Other | Admitting: Registered"

## 2019-01-22 NOTE — Telephone Encounter (Signed)
Scheduled for tomorrow AM.  Jone Baseman, CMA

## 2019-01-22 NOTE — Telephone Encounter (Signed)
Pt called that the muscle relaxers are not working for her. Please give pt a call back.

## 2019-01-23 ENCOUNTER — Telehealth (INDEPENDENT_AMBULATORY_CARE_PROVIDER_SITE_OTHER): Payer: Medicaid Other | Admitting: Family Medicine

## 2019-01-23 ENCOUNTER — Other Ambulatory Visit: Payer: Self-pay

## 2019-01-23 DIAGNOSIS — M5441 Lumbago with sciatica, right side: Secondary | ICD-10-CM

## 2019-01-23 MED ORDER — METHOCARBAMOL 500 MG PO TABS: 500.0000 mg | ORAL_TABLET | Freq: Three times a day (TID) | ORAL | 0 refills | Status: DC | PRN

## 2019-01-23 NOTE — Progress Notes (Signed)
 [facility] Telemedicine Visit  Patient consented to have virtual visit. Method of visit: Telephone  Encounter participants: Patient: Bridget [name] - located at outside home then in home (moved inside at start of call)  Provider: Westley Chandler - located at home  Chief Complaint: muscle spasms and pain   HPI: Bridget [name] is a 58 year old woman with history of tobacco abuse, depression, tobacco abuse, and arthritis presenting via phone only visit due to patient preference and inability to work her camera on her phone for evaluation of back pain.  The patient has had on and off again back pain for many years.  She has known spinal stenosis and degenerative disc disease of her back.  She reports her typical back pain has been flaring the last week.  She is tried Tylenol between 2 and 3 g a day, ibuprofen now transition to Aleve, pickle juice, mustard packets, ice, heat and Zanaflex.  She reports the only narcotic medications work for her back pain.  She reports that Mobic, diclofenac, diclofenac gel, cyclobenzaprine and baclofen are all ineffective for back pain.  Zanaflex does not work either.  She reports she is able to do her usual activities.  She has not fallen.  She denies bowel or bladder incontinence lower extremity weakness or falls.  She feels otherwise well.  She is trying to socially isolate as much as possible.  ROS: per HPI  Pertinent PMHx:  Reviewed prescription drug monitoring program, the patient has gotten narcotics from a variety of prescribers over 3 number in the last year.   Chronic low back pain reviewed prior MRI Tobacco abuse   Exam:  Respiratory: breathing comfortably on room air, speaking in full sentences   MRI Lumbar Spine 06/2018  IMPRESSION: 1. Progressive disc and facet degeneration since 2015, as above. 2. Left L5 impingement in the L4-5 subarticular recess due to herniation and facet hypertrophy. Spinal stenosis is mild  to moderate at this level. Moderate left foraminal narrowing. 3. L3-4 bilateral subarticular recess narrowing that could affect either L4 nerve root.  Assessment/Plan:  Diagnoses and all orders for this visit:  Acute right-sided back pain with sciatica without red flag symptoms this is characteristic of her prior back pain without change.  No indication for advanced imaging at this point.  Reviewed ice, heat, stretching, appropriate dose of Tylenol.  She is to continue naproxen.  We engaged in shared decision-making about the risks of narcotic medications and there inefficacy for chronic low back pain.  She is to call back if the below therapy does not improve her symptoms. -     methocarbamol (ROBAXIN) 500 MG tablet; Take 1 tablet (500 mg total) by mouth every 8 (eight) hours as needed for muscle spasms.  Time spent during visit with patient: 18 minutes

## 2019-02-22 ENCOUNTER — Telehealth: Payer: Medicaid Other | Admitting: Student in an Organized Health Care Education/Training Program

## 2019-02-22 ENCOUNTER — Other Ambulatory Visit: Payer: Self-pay

## 2019-02-22 DIAGNOSIS — R252 Cramp and spasm: Secondary | ICD-10-CM

## 2019-02-22 NOTE — Progress Notes (Signed)
Attempted to call patient for virtual visit. CMA called patient multiple times. No ability to leave VM.  Called patient at 4:13PM with no answer and no option to leave VM.
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
## 2019-02-28 ENCOUNTER — Ambulatory Visit: Payer: Medicaid Other | Admitting: Registered"

## 2019-03-05 ENCOUNTER — Encounter (HOSPITAL_COMMUNITY): Payer: Self-pay | Admitting: Psychiatry

## 2019-03-05 ENCOUNTER — Other Ambulatory Visit: Payer: Self-pay

## 2019-03-05 ENCOUNTER — Ambulatory Visit (INDEPENDENT_AMBULATORY_CARE_PROVIDER_SITE_OTHER): Payer: Medicaid Other | Admitting: Psychiatry

## 2019-03-05 DIAGNOSIS — F331 Major depressive disorder, recurrent, moderate: Secondary | ICD-10-CM

## 2019-03-05 DIAGNOSIS — F4310 Post-traumatic stress disorder, unspecified: Secondary | ICD-10-CM

## 2019-03-05 MED ORDER — MIRTAZAPINE 15 MG PO TABS: ORAL_TABLET | ORAL | 0 refills | Status: DC

## 2019-03-05 NOTE — Patient Instructions (Signed)
1. Start mirtazapine 7.5 mg at night for one week, then 15 mg at night 2. Referral to therapy in [facility]

## 2019-03-06 NOTE — Progress Notes (Signed)
Dr. Pollie Meyer helped figure it out. You are right, it has to be listed as  LOS - no charge. The chart is closed now, thank you!

## 2019-03-11 ENCOUNTER — Ambulatory Visit (HOSPITAL_COMMUNITY): Payer: Medicaid Other | Admitting: Licensed Clinical Social Worker

## 2019-03-11 ENCOUNTER — Telehealth (HOSPITAL_COMMUNITY): Payer: Self-pay | Admitting: Licensed Clinical Social Worker

## 2019-03-11 NOTE — Telephone Encounter (Signed)
Patient was scheduled for telehealth [place] at 10:00am. Patient did not join [employer], so counselor attempted phone contact. Patient did not answer; counselor left message at 10:08am with callback request and number. Counselor called again at 10:21 and left voicemail for patient to call [facility] to reschedule.

## 2019-03-12 ENCOUNTER — Other Ambulatory Visit: Payer: Self-pay

## 2019-03-12 ENCOUNTER — Telehealth (INDEPENDENT_AMBULATORY_CARE_PROVIDER_SITE_OTHER): Payer: Medicaid Other | Admitting: Family Medicine

## 2019-03-12 DIAGNOSIS — F419 Anxiety disorder, unspecified: Secondary | ICD-10-CM | POA: Diagnosis not present

## 2019-03-13 ENCOUNTER — Telehealth (HOSPITAL_COMMUNITY): Payer: Self-pay | Admitting: Psychiatry

## 2019-03-13 NOTE — Telephone Encounter (Signed)
Called the patient to discuss her concern of possible side effect from mirtazapine (notied by her PCP). She was not available. Left the voice message to contact our office if she would like to discuss about the medication.
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
## 2019-03-19 ENCOUNTER — Telehealth (INDEPENDENT_AMBULATORY_CARE_PROVIDER_SITE_OTHER): Payer: Medicaid Other | Admitting: Family Medicine

## 2019-03-19 ENCOUNTER — Other Ambulatory Visit: Payer: Self-pay

## 2019-03-19 DIAGNOSIS — F411 Generalized anxiety disorder: Secondary | ICD-10-CM

## 2019-03-19 NOTE — Assessment & Plan Note (Signed)
Patient has significant anxiety in the setting of a stressful life situation with homelessness.  She is established with behavioral health.  Given contact information for her to self schedule with social worker for counseling.  Encouraged patient efforts to find housing.  Discussed measures to take if she feels acutely unsafe in any situation.  Patient voiced good understanding and appreciation.
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
## 2019-03-20 ENCOUNTER — Ambulatory Visit (INDEPENDENT_AMBULATORY_CARE_PROVIDER_SITE_OTHER): Payer: Medicaid Other | Admitting: Licensed Clinical Social Worker

## 2019-03-20 DIAGNOSIS — F4310 Post-traumatic stress disorder, unspecified: Secondary | ICD-10-CM | POA: Diagnosis not present

## 2019-03-20 DIAGNOSIS — F331 Major depressive disorder, recurrent, moderate: Secondary | ICD-10-CM
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
## 2019-03-21 ENCOUNTER — Ambulatory Visit: Payer: Self-pay | Admitting: Licensed Clinical Social Worker

## 2019-03-21 DIAGNOSIS — Z139 Encounter for screening, unspecified: Secondary | ICD-10-CM
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
## 2019-03-22 ENCOUNTER — Ambulatory Visit (HOSPITAL_COMMUNITY): Payer: Medicaid Other | Admitting: Psychiatry

## 2019-03-26 ENCOUNTER — Ambulatory Visit: Payer: Medicaid Other | Admitting: Family Medicine

## 2019-03-26 ENCOUNTER — Other Ambulatory Visit: Payer: Self-pay

## 2019-03-26 ENCOUNTER — Telehealth (INDEPENDENT_AMBULATORY_CARE_PROVIDER_SITE_OTHER): Payer: Medicaid Other | Admitting: Family Medicine

## 2019-03-26 DIAGNOSIS — F411 Generalized anxiety disorder: Secondary | ICD-10-CM | POA: Diagnosis present

## 2019-03-26 DIAGNOSIS — Z609 Problem related to social environment, unspecified: Secondary | ICD-10-CM
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
## 2019-03-27 ENCOUNTER — Ambulatory Visit (INDEPENDENT_AMBULATORY_CARE_PROVIDER_SITE_OTHER): Payer: Medicaid Other | Admitting: Licensed Clinical Social Worker

## 2019-03-27 ENCOUNTER — Other Ambulatory Visit (HOSPITAL_COMMUNITY): Payer: Self-pay | Admitting: Psychiatry

## 2019-03-27 DIAGNOSIS — Z7189 Other specified counseling: Secondary | ICD-10-CM

## 2019-03-27 MED ORDER — ESCITALOPRAM OXALATE 10 MG PO TABS: ORAL_TABLET | ORAL | 0 refills | Status: DC
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
## 2019-03-27 NOTE — Telephone Encounter (Signed)
Contacted the patient after receiving notification from her PCP and therapist. She discontinued mirtazapine after three days of trials due to drowsiness. She is willing to try other psychotropics.  Will start Lexapro to target PTSD and depression.  Will start from lower dose.  Discussed potential GI side effect.  She has a follow-up appointment in July 2. Of note, the patient did not have contact number for [place] office; provided the correct number.  - Start lexapro 5 mg daily for one week, then 10 mg daily

## 2019-04-04 ENCOUNTER — Ambulatory Visit (HOSPITAL_COMMUNITY): Payer: Medicaid Other | Admitting: Psychiatry

## 2019-04-04 ENCOUNTER — Other Ambulatory Visit: Payer: Self-pay

## 2019-04-04 ENCOUNTER — Telehealth (HOSPITAL_COMMUNITY): Payer: Self-pay | Admitting: Psychiatry

## 2019-04-04 NOTE — Telephone Encounter (Signed)
Sent link for video visit through [facility]. Patient did not sign in. Called the patient  twice for appointment scheduled today. The patient did not answer the phone. Left voice message to contact the office.  

## 2019-04-08 ENCOUNTER — Encounter (HOSPITAL_COMMUNITY): Payer: Self-pay | Admitting: Psychiatry

## 2019-04-08 ENCOUNTER — Other Ambulatory Visit: Payer: Self-pay

## 2019-04-08 ENCOUNTER — Ambulatory Visit (INDEPENDENT_AMBULATORY_CARE_PROVIDER_SITE_OTHER): Payer: Medicaid Other | Admitting: Psychiatry

## 2019-04-08 DIAGNOSIS — F4310 Post-traumatic stress disorder, unspecified: Secondary | ICD-10-CM | POA: Diagnosis not present

## 2019-04-08 DIAGNOSIS — F331 Major depressive disorder, recurrent, moderate: Secondary | ICD-10-CM | POA: Diagnosis not present

## 2019-04-08 NOTE — Progress Notes (Signed)
   Virtual Visit via Telephone Note  I connected with Bridget [name] on 88/67/73 at  1:00 PM EDT by telephone and verified that I am speaking with the correct person using two identifiers.  Location: Patient: Bridget [name] Provider: [name], LCSW   I discussed the limitations, risks, security and privacy concerns of performing an evaluation and management service by telephone and the availability of in person appointments. I also discussed with the patient that there may be a patient responsible charge related to this service. The patient expressed understanding and agreed to proceed.   History of Present Illness: PTSD and MDD   Observations/Objective: Counselor met with Bridget [name] for individual therapy via Webex. This was our first session together. A CCA had recently been completed for Bridget [name] last month, so we reviewed her CCA, received updates and created a treatment plan to address current needs and concerns. Bridget [name] denied suicidal ideation or self-harm behaviors. 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 Bridget [name] noted that her anxiety has significantly decreased since finding stable housing. Counselor explored her daily functioning and assessed current coping skills. Counselor provided her with community resources and discussed scheduling upcoming appointments.   Assessment and Plan: Counselor will continue to meet with Bridget [name] to address treatment plan goals. Bridget [name] will continue to follow recommendations of providers and implement skills learned in session.  Follow Up Instructions: Counselor will send information for next session via Webex.     I discussed the assessment and treatment plan with the patient. The patient was provided an opportunity to ask questions and all were answered. The patient agreed with the plan and demonstrated an  understanding of the instructions.   The patient was advised to call back or seek an in-person evaluation if the symptoms worsen or if the condition fails to improve as anticipated.  I provided 60 minutes of non-face-to-face time during this encounter.   [name], LCSW

## 2019-04-21 ENCOUNTER — Telehealth (INDEPENDENT_AMBULATORY_CARE_PROVIDER_SITE_OTHER): Payer: Medicaid Other | Admitting: Family Medicine

## 2019-04-21 NOTE — Progress Notes (Signed)
Patient called clinic after hours emergency line complaining of hip pain.  This is been a chronic issue for her, she declines offer gabapentin and says that the current medication she has at home including a muscle relaxer are not sufficient.  She says the other thing that work is hydrocodone.  When informed that I cannot offer her narcotic medication over the phone and that I can schedule an appointment for her to see someone in the morning, she said that she declines and intends to go see an urgent care and try and get something from them.  I told her that I understand if she think she needs to be seen was on immediately she can go do so.  She did say that we have not had a tele-visit for a while she is my primary care patient and that we would schedule a check in on Tuesday afternoon over the phone because she does not like coming to the clinic personally.  Dr. Criss Rosales

## 2019-04-23 ENCOUNTER — Other Ambulatory Visit: Payer: Self-pay

## 2019-04-23 ENCOUNTER — Telehealth (INDEPENDENT_AMBULATORY_CARE_PROVIDER_SITE_OTHER): Payer: Medicaid Other | Admitting: Family Medicine

## 2019-04-23 DIAGNOSIS — G894 Chronic pain syndrome: Secondary | ICD-10-CM | POA: Diagnosis not present

## 2019-04-28 NOTE — Progress Notes (Signed)
Virtual Visit via Telephone Note  I connected with Bridget [name] on 09/81/19 at  4:10 PM EDT by telephone and verified that I am speaking with the correct person using two identifiers.  Location: Patient: home Provider: [facility] clinic   I discussed the limitations, risks, security and privacy concerns of performing an evaluation and management service by telephone and the availability of in person appointments. I also discussed with the patient that there may be a patient responsible charge related to this service. The patient expressed understanding and agreed to proceed.  Patient did not have video capability and so we are converting this appointment over the phone  History of Present Illness: Patient with chronic pain issues.  She says this is generally hip and back.  There is no new event causing this and this is stable with her chronic pain.  She says that she is tried the muscle relaxant approach and ibuprofen and it is not helping.  She has no radiculopathy, no paralysis or paresthesia.  She prefers not to come into the clinic for fear of COVID.   Observations/Objective: Stable voice, speaking in full sentences.  Is alert and remembers her medical history.  Assessment and Plan: Patient is insistent that she has negative reactions to tramadol and gabapentin Lyrica.  Given this and the fact that I do not believe that long-term narcotics is the appropriate approach for chronic pain I have offered the patient a referral to pain management and she accepts.  Follow Up Instructions:    I discussed the assessment and treatment plan with the patient. The patient was provided an opportunity to ask questions and all were answered. The patient agreed with the plan and demonstrated an understanding of the instructions.   The patient was advised to call back or seek an in-person evaluation if the symptoms worsen or if the condition fails to improve as anticipated.  I provided 12 minutes of  non-face-to-face time during this encounter.   Sherene Sires, DO

## 2019-05-08 ENCOUNTER — Ambulatory Visit (INDEPENDENT_AMBULATORY_CARE_PROVIDER_SITE_OTHER): Payer: Medicaid Other | Admitting: Psychiatry

## 2019-05-08 ENCOUNTER — Other Ambulatory Visit: Payer: Self-pay

## 2019-05-08 ENCOUNTER — Encounter (HOSPITAL_COMMUNITY): Payer: Self-pay | Admitting: Psychiatry

## 2019-05-08 DIAGNOSIS — F331 Major depressive disorder, recurrent, moderate: Secondary | ICD-10-CM

## 2019-05-08 DIAGNOSIS — F4310 Post-traumatic stress disorder, unspecified: Secondary | ICD-10-CM | POA: Diagnosis not present

## 2019-05-08 NOTE — Progress Notes (Signed)
Virtual Visit via Telephone Note  I connected with [name] on 09/25/48 at  3:00 PM EDT by telephone and verified that I am speaking with the correct person using two identifiers.  Location: Patient: [name] Provider: [name], LCSW   I discussed the limitations, risks, security and privacy concerns of performing an evaluation and management service by telephone and the availability of in person appointments. I also discussed with the patient that there may be a patient responsible charge related to this service. The patient expressed understanding and agreed to proceed.   History of Present Illness: PTSD and MDD   Observations/Objective: Counselor met with Betul for individual therapy via Webex. Counselor assessed MH symptoms and progress on treatment plan goals. Blaike denied suicidal ideation or self-harm behaviors. Lucill shared that she has been experiencing "flashes of panic" and increased anxiety over the past few weeks. Counselor explored these sensations and symptoms for better understanding. Counselor provided psychoeducation on medication management, coping strategies and preventative measures to reduce anxiety. Karmen reported being sensitive to light and sounds, which causes her to have racing and irrational thought patterns. Rupal reported being concerned about her physcial health, as she remains in much pain from chronic back problems as well as significant weight loss over the past year. Takisha stated being concerned that she is "anorexic". Counselor assessed her eating behaviors and confirmed that she is being followed by a nutrionalist and PCP. Counselor will contact her PCP and Psychiatrist to discuss treatment options for her current concerns.   Assessment and Plan: Counselor will continue to meet with patient to address treatment plan goals. Patient will continue to follow recommendations of providers and implement skills learned in session.  Follow Up  Instructions: Counselor will send information for next session via Webex.   I discussed the assessment and treatment plan with the patient. The patient was provided an opportunity to ask questions and all were answered. The patient agreed with the plan and demonstrated an understanding of the instructions.   The patient was advised to call back or seek an in-person evaluation if the symptoms worsen or if the condition fails to improve as anticipated.  I provided 38 minutes of non-face-to-face time during this encounter.   [name], LCSW

## 2019-07-02 ENCOUNTER — Ambulatory Visit (INDEPENDENT_AMBULATORY_CARE_PROVIDER_SITE_OTHER): Payer: Medicaid Other | Admitting: Psychiatry

## 2019-07-02 ENCOUNTER — Encounter (HOSPITAL_COMMUNITY): Payer: Self-pay | Admitting: Psychiatry

## 2019-07-02 ENCOUNTER — Other Ambulatory Visit: Payer: Self-pay

## 2019-07-02 DIAGNOSIS — F331 Major depressive disorder, recurrent, moderate: Secondary | ICD-10-CM | POA: Diagnosis not present

## 2019-07-02 DIAGNOSIS — F4310 Post-traumatic stress disorder, unspecified: Secondary | ICD-10-CM | POA: Diagnosis not present

## 2019-07-02 NOTE — Progress Notes (Signed)
Virtual Visit via Video Note  I connected with Bridget [name] on 29/92/42 at  2:00 PM EDT by a video enabled telemedicine application and verified that I am speaking with the correct person using two identifiers.  Location: Patient: Bridget [name] Provider: [name], LCSW   I discussed the limitations of evaluation and management by telemedicine and the availability of in person appointments. The patient expressed understanding and agreed to proceed.  History of Present Illness: PTSD and MDD   Observations/Objective: Counselor met with Bridget [name] for individual therapy via Webex. Counselor assessed MH symptoms and progress on treatment plan goals. Bridget [name] denied suicidal ideation or self-harm behaviors. Thurma shared that since our last session her situation has been improving slightly over time. Counselor assessed daily functioning and basic needs. Bridget [name] confirmed that she is in stable housing and has basic needs met, highlighting some positives about her new living situation. Bridget [name] reported that she has gained 14 lbs in the past month and a half, as she is actively trying to improve food intake and nutrition, because she was becoming malnourished due to health and mental health factors. Counselor processed current concerns of Bridget [name] related to her family life situation and past traumas. Bridget [name] requested to meet with the counselor in person for upcoming appointments so she can be more open and honest with her in a safe space, without fear of others overhearing the sessions. Counselor to look into making this request a reality, as the need is there.   Assessment and Plan: Counselor will continue to meet with patient to address treatment plan goals. Patient will continue to follow recommendations of providers and implement skills learned in session.  Follow Up Instructions: Counselor will send information for next session via Webex.     I discussed the assessment and treatment plan with the  patient. The patient was provided an opportunity to ask questions and all were answered. The patient agreed with the plan and demonstrated an understanding of the instructions.   The patient was advised to call back or seek an in-person evaluation if the symptoms worsen or if the condition fails to improve as anticipated.  I provided 60 minutes of non-face-to-face time during this encounter.   [name], LCSW

## 2019-07-08 ENCOUNTER — Encounter (HOSPITAL_COMMUNITY): Payer: Self-pay | Admitting: Psychiatry

## 2019-07-08 ENCOUNTER — Ambulatory Visit (INDEPENDENT_AMBULATORY_CARE_PROVIDER_SITE_OTHER): Payer: Medicaid Other | Admitting: Psychiatry

## 2019-07-08 ENCOUNTER — Other Ambulatory Visit: Payer: Self-pay

## 2019-07-08 DIAGNOSIS — F331 Major depressive disorder, recurrent, moderate: Secondary | ICD-10-CM | POA: Diagnosis not present

## 2019-07-08 DIAGNOSIS — F4310 Post-traumatic stress disorder, unspecified: Secondary | ICD-10-CM | POA: Diagnosis not present

## 2019-07-08 NOTE — Progress Notes (Signed)
Virtual Visit via Video Note  I connected with [name] on 63/14/97 at  3:00 PM EDT by a video enabled telemedicine application and verified that I am speaking with the correct person using two identifiers.  Location: Patient: Bridget [name]  Provider: [name], LCSW   I discussed the limitations of evaluation and management by telemedicine and the availability of in person appointments. The patient expressed understanding and agreed to proceed.  History of Present Illness: PTSD and MDD   Observations/Objective: Counselor met with Marvelle for individual therapy via telephone. Counselor and Chareese discussed meeting in person upon our next session, as Jacklyn feels as though she can be more open get more out of sessions in person. Counselor reviewed the protocols for COVID screening and precautions upon her arrival and throughout the duration of the session. Counselor assessed MH symptoms and progress on treatment plan goals. Liberty denied suicidal ideation or self-harm behaviors. Marylynn reported that she is seeing a reduction of anxiety symptoms and shared various improvement in symptoms. One marked improvement is her ability to write creatively again and that she did not cry in the past 7 days. Counselor shared psychoeducation on the brain, how it copes with safety and basic needs, vs emotions, logic and higher learning/connecting. Counselor explored daily functioning and current life stressors. Cassy shared about her children and grandchildren and the quality time they have been sharing together. Counselor took time to ensure Reizy has the address for our in person session next week and answered various questions about the protocol.     Assessment and Plan: Counselor will continue to meet with patient to address treatment plan goals. Patient will continue to follow recommendations of providers and implement skills learned in session.  Follow Up Instructions: Counselor will send  information for next session via Webex.   I discussed the assessment and treatment plan with the patient. The patient was provided an opportunity to ask questions and all were answered. The patient agreed with the plan and demonstrated an understanding of the instructions.   The patient was advised to call back or seek an in-person evaluation if the symptoms worsen or if the condition fails to improve as anticipated.  I provided 45 minutes of non-face-to-face time during this encounter.   [name], LCSW

## 2019-07-15 ENCOUNTER — Ambulatory Visit (INDEPENDENT_AMBULATORY_CARE_PROVIDER_SITE_OTHER): Payer: Medicaid Other | Admitting: Psychiatry

## 2019-07-15 ENCOUNTER — Encounter (HOSPITAL_COMMUNITY): Payer: Self-pay | Admitting: Psychiatry

## 2019-07-15 ENCOUNTER — Other Ambulatory Visit: Payer: Self-pay

## 2019-07-15 DIAGNOSIS — F331 Major depressive disorder, recurrent, moderate: Secondary | ICD-10-CM | POA: Diagnosis not present

## 2019-07-15 DIAGNOSIS — F4310 Post-traumatic stress disorder, unspecified: Secondary | ICD-10-CM
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
## 2019-07-22 ENCOUNTER — Other Ambulatory Visit: Payer: Self-pay

## 2019-07-22 ENCOUNTER — Ambulatory Visit (INDEPENDENT_AMBULATORY_CARE_PROVIDER_SITE_OTHER): Payer: Medicaid Other | Admitting: Psychiatry

## 2019-07-22 ENCOUNTER — Encounter (HOSPITAL_COMMUNITY): Payer: Self-pay | Admitting: Psychiatry

## 2019-07-22 DIAGNOSIS — F331 Major depressive disorder, recurrent, moderate: Secondary | ICD-10-CM | POA: Diagnosis not present

## 2019-07-22 DIAGNOSIS — F4310 Post-traumatic stress disorder, unspecified: Secondary | ICD-10-CM

## 2019-07-22 NOTE — Progress Notes (Signed)
Virtual Visit via In-Person  I connected with Bridget [name] on 94/07/68 at  3:00 PM EDT in office.  Location: Patient: Bridget [name] Provider: [name], LCSW   I discussed the limitations, risks, security and privacy concerns of performing an evaluation and management service by telephone and the availability of in person appointments. I also discussed with the patient that there may be a patient responsible charge related to this service. The patient expressed understanding and agreed to proceed.   History of Present Illness: PTSD and MDD   Observations/Objective: Counselor met with Bridget [name] for individual therapy via Webex. Counselor assessed MH symptoms and progress on treatment plan goals. Bridget [name] presented with moderate depress and moderate anxiety. Bridget [name] denied suicidal ideation or self-harm behaviors. Bridget [name] shared that she looks forward to therapy sessions, as it is only one of two of her healthy outlets. Counselor explored her coping skills, mental health outlets and supports, as well as daily functioning. Bridget [name] shared reflections and insightfulness related to her purpose and meaning in life. She has been able to appreciate and identify why she has experienced so many challenges in life and particularly in the past year. Counselor processed healthy relationships vs unhealthy relationship warning signs and benefits. Bridget [name] reports being able to set firm boundaries with others to protect her health and mental health. We will continue to meet weekly and Bridget [name] will make more appointments for continuity of care.  Assessment and Plan: Counselor will continue to meet with patient to address treatment plan goals. Patient will continue to follow recommendations of providers and implement skills learned in session.  Follow Up Instructions: Counselor will send information for next session via Webex.     I discussed the assessment and treatment plan with the patient. The patient was provided  an opportunity to ask questions and all were answered. The patient agreed with the plan and demonstrated an understanding of the instructions.   The patient was advised to call back or seek an in-person evaluation if the symptoms worsen or if the condition fails to improve as anticipated.  I provided 60 minutes of non-face-to-face time during this encounter.   [name], LCSW

## 2019-07-29 ENCOUNTER — Other Ambulatory Visit: Payer: Self-pay

## 2019-07-29 ENCOUNTER — Ambulatory Visit (INDEPENDENT_AMBULATORY_CARE_PROVIDER_SITE_OTHER): Payer: Medicaid Other | Admitting: Psychiatry

## 2019-07-29 ENCOUNTER — Encounter (HOSPITAL_COMMUNITY): Payer: Self-pay | Admitting: Psychiatry

## 2019-07-29 DIAGNOSIS — F4310 Post-traumatic stress disorder, unspecified: Secondary | ICD-10-CM | POA: Diagnosis not present

## 2019-07-29 DIAGNOSIS — F331 Major depressive disorder, recurrent, moderate: Secondary | ICD-10-CM | POA: Diagnosis not present

## 2019-07-29 NOTE — Progress Notes (Signed)
Virtual Visit via Video Note  I connected with [name] on 02/63/78 at  3:00 PM EDT by a video enabled telemedicine application and verified that I am speaking with the correct person using two identifiers.  Location: Patient: Bridget [name] Provider: [name], LCSW   I discussed the limitations of evaluation and management by telemedicine and the availability of in person appointments. The patient expressed understanding and agreed to proceed.  History of Present Illness: MDD and PTSD   Observations/Objective: Counselor met with Salinda for individual therapy via Webex. Counselor assessed MH symptoms and progress on treatment plan goals. Cashmere presented with moderate depression and moderate anxiety. Karlina denied suicidal ideation or self-harm behaviors. Ajla shared that she was in a happy and grateful place, sharing a variety of positive reports in relation to her children, ex-husband, an upcoming vacation, and transportation solution. Counselor celebrated the positive reports with Peter [place] and processed her takeaways from the interactions and events. Counselor assessed daily functioning, with Linzee sharing that her sleep is doing ok, however she continues to have difficulties with consuming food, due to dental and nausea related issues. Counselor promoted follow up with providers to address this issue. Redith expressed that her food issues cause depression, became teary and was unable to unpack her thoughts and feelings more on the topic. Counselor provided psychoeducation on anxiety reduction and relaxation methods Tashya can use to address food related distress. Zaylin shared the breathing techniques that she prefers and utilizes to cope. Counselor and Latorria planned [employer].   Assessment and Plan: Counselor will continue to meet with patient to address treatment plan goals. Patient will continue to follow recommendations of providers and implement skills learned in  session.  Follow Up Instructions: Counselor will send information for next session via Webex.     I discussed the assessment and treatment plan with the patient. The patient was provided an opportunity to ask questions and all were answered. The patient agreed with the plan and demonstrated an understanding of the instructions.   The patient was advised to call back or seek an in-person evaluation if the symptoms worsen or if the condition fails to improve as anticipated.  I provided 60 minutes of non-face-to-face time during this encounter.   [name], LCSW

## 2019-08-14 ENCOUNTER — Ambulatory Visit (HOSPITAL_COMMUNITY): Payer: Medicaid Other | Admitting: Psychiatry

## 2019-08-19 ENCOUNTER — Encounter (HOSPITAL_COMMUNITY): Payer: Self-pay | Admitting: Psychiatry

## 2019-08-19 ENCOUNTER — Ambulatory Visit (INDEPENDENT_AMBULATORY_CARE_PROVIDER_SITE_OTHER): Payer: Medicaid Other | Admitting: Psychiatry

## 2019-08-19 ENCOUNTER — Other Ambulatory Visit: Payer: Self-pay

## 2019-08-19 DIAGNOSIS — F331 Major depressive disorder, recurrent, moderate: Secondary | ICD-10-CM

## 2019-08-19 DIAGNOSIS — F4310 Post-traumatic stress disorder, unspecified: Secondary | ICD-10-CM | POA: Diagnosis not present

## 2019-08-19 NOTE — Progress Notes (Signed)
Virtual Visit via Video Note  I connected with [name] on 41/28/78 at  2:00 PM EST by a video enabled telemedicine application and verified that I am speaking with the correct person using two identifiers.  Location: Patient: [name] Provider: [name], LCSW   I discussed the limitations of evaluation and management by telemedicine and the availability of in person appointments. The patient expressed understanding and agreed to proceed.  History of Present Illness: MDD and PTSD   Observations/Objective: Counselor met with Ylonda for individual therapy via Webex. Counselor assessed MH symptoms and progress on treatment plan goals. Kirbie presents with severe depression and high anxiety. Adlynn denied suicidal ideation or self-harm behaviors. Zakeya shared that she is currently experiencing homelessness due to conflict between her are her landlord. Counselor processed thoughts and feelings related to the forced transition. Counselor assessed  living arrangements and provided resources for housing. Polina discussed the challenges of living with her adult children. She was very distressed by her situation and how she is being treated. Melisssa expressed acknowledgement of resiliency and moments of hopelessness. She attributed resiliency to her faith. Counselor explored healthy and realistic coping strategies for Devynne to implement during this transition. Saydie wanted to explore trauma and grief/loss matters related to the transition. Counselor provided psychoeducation and coping mechanisms for addressing these domains. Counselor to follow up with additional resources for [place].   Assessment and Plan: Counselor will continue to meet with patient to address treatment plan goals. Patient will continue to follow recommendations of providers and implement skills learned in session.  Follow Up Instructions: Counselor will send information for next session via Webex.     I discussed the  assessment and treatment plan with the patient. The patient was provided an opportunity to ask questions and all were answered. The patient agreed with the plan and demonstrated an understanding of the instructions.   The patient was advised to call back or seek an in-person evaluation if the symptoms worsen or if the condition fails to improve as anticipated.  I provided 60 minutes of non-face-to-face time during this encounter.   [name], LCSW

## 2019-09-09 ENCOUNTER — Other Ambulatory Visit: Payer: Self-pay

## 2019-09-09 ENCOUNTER — Encounter (HOSPITAL_COMMUNITY): Payer: Self-pay | Admitting: Psychiatry

## 2019-09-09 ENCOUNTER — Ambulatory Visit (INDEPENDENT_AMBULATORY_CARE_PROVIDER_SITE_OTHER): Payer: Medicaid Other | Admitting: Psychiatry

## 2019-09-09 DIAGNOSIS — F4310 Post-traumatic stress disorder, unspecified: Secondary | ICD-10-CM

## 2019-09-09 DIAGNOSIS — F331 Major depressive disorder, recurrent, moderate: Secondary | ICD-10-CM | POA: Diagnosis not present

## 2019-09-09 NOTE — Progress Notes (Signed)
Virtual Visit via Video Note  I connected with Bridget [name] on 15/87/27 at  2:00 PM EST by a video enabled telemedicine application and verified that I am speaking with the correct person using two identifiers.  Location: Patient: Bridget [name] Provider: [name], LCSW   I discussed the limitations of evaluation and management by telemedicine and the availability of in person appointments. The patient expressed understanding and agreed to proceed.  History of Present Illness: PTSD and MDD   Observations/Objective: Counselor met with Bridget [name] for individual therapy via Webex. Counselor assessed MH symptoms and progress on treatment plan goals. Bridget [name] presents with severe anxiety and severe depression. Bridget [name] denied suicidal ideation or self-harm behaviors. Bridget [name] shared that she is experiencing homelessness, which is negatively impacting her mental and emotional health. Counselor processed thoughts, feelings and coping strategies used to combat trauma triggers and depression. Counselor provided psychotherapeutic interventions to address current symptoms and needs. Counselor and Bridget [name] discussed concrete resources and agencies she can contact to address homelessness. Counselor ensured safety and promoted utilization of crisis plan and numbers. Counselor and Yariana discussed hospitalization to addressed increased impact on mood and mental stability. Bridget [name] plans to contact resources, will contact crisis numbers if needed and will be assessed in behavioral health if symptoms increase.   Assessment and Plan: Counselor will continue to meet with patient to address treatment plan goals. Patient will continue to follow recommendations of providers and implement skills learned in session.  Follow Up Instructions: Counselor will send information for next session via Webex.    I discussed the assessment and treatment plan with the patient. The patient was provided an opportunity to ask questions and all  were answered. The patient agreed with the plan and demonstrated an understanding of the instructions.   The patient was advised to call back or seek an in-person evaluation if the symptoms worsen or if the condition fails to improve as anticipated.  I provided 60 minutes of non-face-to-face time during this encounter.   [name], LCSW

## 2019-09-16 ENCOUNTER — Encounter (HOSPITAL_COMMUNITY): Payer: Self-pay | Admitting: Psychiatry

## 2019-09-16 ENCOUNTER — Ambulatory Visit (HOSPITAL_COMMUNITY): Payer: Medicaid Other | Admitting: Psychiatry

## 2019-09-16 ENCOUNTER — Other Ambulatory Visit: Payer: Self-pay

## 2019-09-16 DIAGNOSIS — F4310 Post-traumatic stress disorder, unspecified: Secondary | ICD-10-CM

## 2019-09-16 DIAGNOSIS — F331 Major depressive disorder, recurrent, moderate: Secondary | ICD-10-CM

## 2019-09-16 NOTE — Progress Notes (Signed)
In Office, In Person Visit Note  I met with Bridget [name] on 38/46/65 at  2:00 PM EST in the office, in person, complying with COVID protocols.   Location: Patient: Bridget [name] Provider: [name], LCSW  History of Present Illness: PTSD and MDD   Observations/Objective: Counselor met with Bridget [name] for individual therapy via in person, in the office. Counselor assessed MH symptoms and progress on treatment plan goals. Bridget [name] presents with moderate depression and moderate anxiety. Bridget [name] denied suicidal ideation or self-harm behaviors. Bridget [name] shared that she has made minimal progress in securing her own housing and that she has been able to temporarily reside with a combination of her children who live locally. Counselor assessed daily functioning, with Bridget [name] reporting improved sleep, reduced hypervigilance, improved appetite and food intake, with better management of mental health through communication and boundary setting with adult children. Bridget [name] reports feeling more hopeful about her situation and the ability to not "small steps of progress from day to day". Counselor and Bridget [name] worked to identify positive personal attributes, such as compassionate, resourcefulness and resiliency to promote healthy self-esteem and purpose. Counselor and Bridget [name] spent time on cognitive coping strategies and application. Bridget [name] plans to connect with local resources today to engage in support groups and to continue search for permanent and safe housing.   Assessment and Plan: Counselor will continue to meet with patient to address treatment plan goals. Patient will continue to follow recommendations of providers and implement skills learned in session.  Follow Up Instructions: Counselor will send information for next session via Webex.     I discussed the assessment and treatment plan with the patient. The patient was provided an opportunity to ask questions and all were answered. The patient agreed with the  plan and demonstrated an understanding of the instructions.   The patient was advised to call back or seek an in-person evaluation if the symptoms worsen or if the condition fails to improve as anticipated.  I provided 55 minutes of non-face-to-face time during this encounter.   [name], LCSW

## 2019-09-23 ENCOUNTER — Other Ambulatory Visit: Payer: Self-pay

## 2019-09-23 ENCOUNTER — Encounter (HOSPITAL_COMMUNITY): Payer: Self-pay | Admitting: Psychiatry

## 2019-09-23 ENCOUNTER — Ambulatory Visit (INDEPENDENT_AMBULATORY_CARE_PROVIDER_SITE_OTHER): Payer: Medicaid Other | Admitting: Psychiatry

## 2019-09-23 DIAGNOSIS — F4310 Post-traumatic stress disorder, unspecified: Secondary | ICD-10-CM | POA: Diagnosis not present

## 2019-09-23 DIAGNOSIS — F331 Major depressive disorder, recurrent, moderate: Secondary | ICD-10-CM

## 2019-09-23 NOTE — Progress Notes (Signed)
Virtual Visit via Video Note  I connected with Bridget [name] on 38/25/05 at  2:00 PM EST by a video enabled telemedicine application and verified that I am speaking with the correct person using two identifiers.  Location: Patient: Bridget [name] Provider: [name], LCSW   I discussed the limitations of evaluation and management by telemedicine and the availability of in person appointments. The patient expressed understanding and agreed to proceed.  History of Present Illness: PTSD and MDD   Observations/Objective: Counselor met with Bridget [name] for individual therapy via Webex. Counselor assessed MH symptoms and progress on treatment plan goals. Bridget [name] presents with moderate anxiety and moderate depression. Bridget [name] denied suicidal ideation or self-harm behaviors. Bridget [name] shared that physically she was not feeling well today, noting headhaches, hip pains and body aches. Bridget [name] was happy to report that she is seeing progress in the area of housing as well as feeling more optimistic about her ability to deal with the stressors of her current homelessness which involves going between the homes of her adult children. Counselor assessed daily functioning, with Tinita noting that all basic needs are being met, her ability to follow up with providers and getting better rest from night to night as she gets use to new routine and environments. Counselor explored coping strategies and dynamics related to how Sweta interacts with her adult children. Stanislawa reflected on how she was parented, vs how she and her husband parented their children. Shariah continues to feel impacted by the negative experiences she's had with close friends and family members, causing her to become teary throughout the session. Chantille discussed need for coping strategies in managing emotions when accessing help from others/entities and engaging in the housing process. Infiniti attributed her faith in keeping her resilient and provided for. Counselor  reiterated the need for boundaries, assertive communication and implementation of self-care to better manage mental health symptoms.   Assessment and Plan: Counselor will continue to meet with patient to address treatment plan goals. Patient will continue to follow recommendations of providers and implement skills learned in session.  Follow Up Instructions: Counselor will send information for next session via Webex.     I discussed the assessment and treatment plan with the patient. The patient was provided an opportunity to ask questions and all were answered. The patient agreed with the plan and demonstrated an understanding of the instructions.   The patient was advised to call back or seek an in-person evaluation if the symptoms worsen or if the condition fails to improve as anticipated.  I provided 55 minutes of non-face-to-face time during this encounter.   [name], LCSW

## 2019-09-30 ENCOUNTER — Encounter (HOSPITAL_COMMUNITY): Payer: Self-pay | Admitting: Psychiatry

## 2019-09-30 ENCOUNTER — Other Ambulatory Visit: Payer: Self-pay

## 2019-09-30 ENCOUNTER — Ambulatory Visit (INDEPENDENT_AMBULATORY_CARE_PROVIDER_SITE_OTHER): Payer: Medicaid Other | Admitting: Psychiatry

## 2019-09-30 DIAGNOSIS — F4310 Post-traumatic stress disorder, unspecified: Secondary | ICD-10-CM | POA: Diagnosis not present

## 2019-09-30 DIAGNOSIS — F331 Major depressive disorder, recurrent, moderate: Secondary | ICD-10-CM | POA: Diagnosis not present
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
## 2019-10-14 ENCOUNTER — Other Ambulatory Visit: Payer: Self-pay

## 2019-10-14 ENCOUNTER — Encounter (HOSPITAL_COMMUNITY): Payer: Self-pay | Admitting: Psychiatry

## 2019-10-14 ENCOUNTER — Ambulatory Visit (INDEPENDENT_AMBULATORY_CARE_PROVIDER_SITE_OTHER): Payer: Medicaid Other | Admitting: Psychiatry

## 2019-10-14 DIAGNOSIS — F4310 Post-traumatic stress disorder, unspecified: Secondary | ICD-10-CM

## 2019-10-14 DIAGNOSIS — F331 Major depressive disorder, recurrent, moderate: Secondary | ICD-10-CM
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
## 2019-10-21 ENCOUNTER — Other Ambulatory Visit: Payer: Self-pay

## 2019-10-21 ENCOUNTER — Encounter (HOSPITAL_COMMUNITY): Payer: Self-pay | Admitting: Psychiatry

## 2019-10-21 ENCOUNTER — Ambulatory Visit (INDEPENDENT_AMBULATORY_CARE_PROVIDER_SITE_OTHER): Payer: Medicaid Other | Admitting: Psychiatry

## 2019-10-21 DIAGNOSIS — F4310 Post-traumatic stress disorder, unspecified: Secondary | ICD-10-CM | POA: Diagnosis not present

## 2019-10-21 DIAGNOSIS — F331 Major depressive disorder, recurrent, moderate: Secondary | ICD-10-CM

## 2019-10-21 NOTE — Progress Notes (Signed)
In Person/In Office Visit Note  I met with Bridget [name] on 10/21/19 at  3:00 PM EST, in person, in the office using COVID protocols   Location: Patient: Bridget [name] Provider:  , LCSW   I discussed the limitations of evaluation and management by telemedicine and the availability of in person appointments. The patient expressed understanding and agreed to proceed.  History of Present Illness: PTSD and MDD   Observations/Objective: Counselor met with Bridget [name] for individual therapy in person, in the office setting, following COVID protocols. Counselor assessed MH symptoms and progress on treatment plan goals. Bridget [name] presents with moderate depression and moderate anxiety. Bridget [name] denied suicidal ideation or self-harm behaviors. Bridget [name] shared that she currently and continuously is having intense/increased pain in her legs, hips an lower back. Counselor promoted contacting providers, prioritizing her health, sleep hygiene and nutrition considerations. Bridget [name] to follow up with providers this week to schedule follow up appointments and plans to attend. Counselor processed grief and loss expressions due to recent loss of a family member, as well as the impact of family dynamics in her life. Counselor reviewed communication and boundary setting skills in relation to specific relationships with her children and grandchildren. Counselor and Bridget [name] explored her values and beliefs to empower assertiveness in communicating about her wants and needs in relationships. Bridget [name] to implement strategies discussed in session and will report back outcome from conversations, observations and interactions.   Assessment and Plan: Counselor will continue to meet with patient to address treatment plan goals. Patient will continue to follow recommendations of providers and implement skills learned in session.  Follow Up Instructions: Counselor will send information for next session via Webex.     I discussed the  assessment and treatment plan with the patient. The patient was provided an opportunity to ask questions and all were answered. The patient agreed with the plan and demonstrated an understanding of the instructions.   The patient was advised to call back or seek an in-person evaluation if the symptoms worsen or if the condition fails to improve as anticipated.  I provided 55 minutes of non-face-to-face time during this encounter.    , LCSW  

## 2019-10-28 ENCOUNTER — Ambulatory Visit (HOSPITAL_COMMUNITY): Payer: Medicaid Other | Admitting: Psychiatry

## 2019-10-28 ENCOUNTER — Other Ambulatory Visit: Payer: Self-pay

## 2019-11-04 ENCOUNTER — Other Ambulatory Visit: Payer: Self-pay

## 2019-11-04 ENCOUNTER — Ambulatory Visit (INDEPENDENT_AMBULATORY_CARE_PROVIDER_SITE_OTHER): Payer: Medicaid Other | Admitting: Psychiatry

## 2019-11-04 ENCOUNTER — Encounter (HOSPITAL_COMMUNITY): Payer: Self-pay | Admitting: Psychiatry

## 2019-11-04 DIAGNOSIS — F331 Major depressive disorder, recurrent, moderate: Secondary | ICD-10-CM

## 2019-11-04 DIAGNOSIS — F4310 Post-traumatic stress disorder, unspecified: Secondary | ICD-10-CM | POA: Diagnosis not present
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
## 2019-11-06 NOTE — Addendum Note (Signed)
Addended by: Hilbert Odor on: 11/06/2019 09:36 PM   Modules accepted: Level of Service

## 2019-11-18 ENCOUNTER — Ambulatory Visit (HOSPITAL_COMMUNITY): Payer: Medicaid Other | Admitting: Psychiatry

## 2019-11-18 ENCOUNTER — Other Ambulatory Visit: Payer: Self-pay

## 2019-11-18 NOTE — Progress Notes (Deleted)
Counselor will be transferring client to Counselor within the Clinic due to Counselor's upcoming maternity leave. Upon return Counselor to check in with progress in treatment and need to transfer back as primary Counselor. Counselor will ensure continuity of care with staffing needs with new provider before Leave.

## 2019-12-02 ENCOUNTER — Ambulatory Visit (HOSPITAL_COMMUNITY): Payer: Medicaid Other | Admitting: Psychiatry

## 2019-12-02 ENCOUNTER — Telehealth (HOSPITAL_COMMUNITY): Payer: Self-pay | Admitting: Licensed Clinical Social Worker

## 2019-12-02 NOTE — Telephone Encounter (Signed)
Attempted contact w/ pt regarding tomorrow's scheduled appt. Left message asking for return of contact.

## 2019-12-03 ENCOUNTER — Other Ambulatory Visit: Payer: Self-pay

## 2019-12-03 ENCOUNTER — Ambulatory Visit (INDEPENDENT_AMBULATORY_CARE_PROVIDER_SITE_OTHER): Payer: Medicaid Other | Admitting: Licensed Clinical Social Worker

## 2019-12-03 DIAGNOSIS — F331 Major depressive disorder, recurrent, moderate: Secondary | ICD-10-CM | POA: Diagnosis not present

## 2019-12-03 DIAGNOSIS — F4310 Post-traumatic stress disorder, unspecified: Secondary | ICD-10-CM
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
## 2019-12-09 ENCOUNTER — Ambulatory Visit (HOSPITAL_COMMUNITY): Payer: Medicaid Other | Admitting: Psychiatry

## 2019-12-16 ENCOUNTER — Ambulatory Visit (HOSPITAL_COMMUNITY): Payer: Medicaid Other | Admitting: Psychiatry

## 2019-12-31 ENCOUNTER — Ambulatory Visit: Payer: Medicaid Other | Admitting: Family Medicine

## 2019-12-31 ENCOUNTER — Other Ambulatory Visit: Payer: Self-pay

## 2019-12-31 ENCOUNTER — Encounter: Payer: Self-pay | Admitting: Family Medicine

## 2019-12-31 VITALS — BP 106/62 | HR 79 | Ht 61.0 in | Wt 133.2 lb

## 2019-12-31 DIAGNOSIS — M7501 Adhesive capsulitis of right shoulder: Secondary | ICD-10-CM

## 2019-12-31 DIAGNOSIS — R634 Abnormal weight loss: Secondary | ICD-10-CM | POA: Diagnosis present

## 2019-12-31 DIAGNOSIS — Z1231 Encounter for screening mammogram for malignant neoplasm of breast: Secondary | ICD-10-CM

## 2019-12-31 NOTE — Patient Instructions (Signed)
VACCINE INFORMATion  [place] Covid vaccine [email or web address]  [facility] vaccine When registration opens, appointments can also be scheduled by phone at [phone] (Option 2) from 8:00 am-5:00 pm, until all appointments have been filled.  How can I receive updates about appointment openings and availability? Monitor the local media. Visit [email or web address] Text [email or web address] to [phone] to subscribe to updates via our text message opt-in system.  Text [email or web address] to [phone] to subscribe to updates in Spanish via our text message opt-in system.

## 2020-01-01 LAB — COMPREHENSIVE METABOLIC PANEL
ALT: 16 IU/L (ref 0–32)
AST: 19 IU/L (ref 0–40)
Albumin/Globulin Ratio: 2.5 — ABNORMAL HIGH (ref 1.2–2.2)
Albumin: 4.7 g/dL (ref 3.8–4.9)
Alkaline Phosphatase: 65 IU/L (ref 39–117)
BUN/Creatinine Ratio: 13 (ref 9–23)
BUN: 10 mg/dL (ref 6–24)
Bilirubin Total: 0.3 mg/dL (ref 0.0–1.2)
CO2: 25 mmol/L (ref 20–29)
Calcium: 9.4 mg/dL (ref 8.7–10.2)
Chloride: 108 mmol/L — ABNORMAL HIGH (ref 96–106)
Creatinine, Ser: 0.76 mg/dL (ref 0.57–1.00)
GFR calc Af Amer: 101 mL/min/{1.73_m2} (ref >59)
GFR calc non Af Amer: 87 mL/min/{1.73_m2} (ref >59)
Globulin, Total: 1.9 g/dL (ref 1.5–4.5)
Glucose: 98 mg/dL (ref 65–99)
Potassium: 4 mmol/L (ref 3.5–5.2)
Sodium: 145 mmol/L — ABNORMAL HIGH (ref 134–144)
Total Protein: 6.6 g/dL (ref 6.0–8.5)

## 2020-01-01 LAB — CBC
Hematocrit: 36.6 % (ref 34.0–46.6)
Hemoglobin: 11.9 g/dL (ref 11.1–15.9)
MCH: 28.7 pg (ref 26.6–33.0)
MCHC: 32.5 g/dL (ref 31.5–35.7)
MCV: 88 fL (ref 79–97)
Platelets: 286 10*3/uL (ref 150–450)
RBC: 4.14 x10E6/uL (ref 3.77–5.28)
RDW: 13.1 % (ref 11.7–15.4)
WBC: 10.2 10*3/uL (ref 3.4–10.8)

## 2020-01-02 DIAGNOSIS — M7501 Adhesive capsulitis of right shoulder: Secondary | ICD-10-CM

## 2020-01-02 HISTORY — DX: Adhesive capsulitis of right shoulder: M75.01

## 2020-01-02 NOTE — Assessment & Plan Note (Signed)
No specific bony tenderness when palpated at rest.  There is painful restriction to active and passive abduction/flexion to less than 90 degrees, also pain with external rotation, cannot fully cross arm over body.  No distal neurological or pulse deficits.  Patient has used Dr. Dion Saucier in the past for surgery at [facility] where he wishes to go see him again, referral placed

## 2020-01-02 NOTE — Progress Notes (Signed)
    SUBJECTIVE:   CHIEF COMPLAINT / HPI:   Right shoulder pain  PERTINENT  PMH / PSH: History of weight loss  OBJECTIVE:   BP 106/62   Pulse 79   Ht 5\' 1"  (1.549 m)   Wt 133 lb 3.2 oz (60.4 kg)   LMP  (LMP Unknown)   SpO2 97%   BMI 25.17 kg/m   General: Alert pleasant talkative Respiratory: Clear to auscultation bilaterally, no increased work of breathing, no cough Cardiac: Regular rate and rhythm MSK: No specific bony tenderness when palpated at rest.  There is painful restriction to active and passive abduction/flexion to less than 90 degrees, also pain with external rotation, cannot fully cross arm over body.  No distal neurological or pulse deficits.  ASSESSMENT/PLAN:   Encounter for screening mammogram for malignant neoplasm of breast Order mammogram as patient is due, no symptoms reported  Weight loss We have been working this up, she did see GI who has put her on medications consistent with H. Pylori (patient does not remember the actual diagnosis but is going to follow-up with them appropriately).  We discussed that due to her history of smoking, she is not ready to quit, that she should go get the low-dose chest CT for lung cancer screening that was ordered a year ago, she says she will could do this now.  Follow-up lab work was unremarkable  Adhesive capsulitis of right shoulder No specific bony tenderness when palpated at rest.  There is painful restriction to active and passive abduction/flexion to less than 90 degrees, also pain with external rotation, cannot fully cross arm over body.  No distal neurological or pulse deficits.  Patient has used Dr. in the past for surgery at [facility] where he wishes to go see him again, referral placed     [facility], DO [facility]

## 2020-01-02 NOTE — Assessment & Plan Note (Signed)
Order mammogram as patient is due, no symptoms reported

## 2020-01-02 NOTE — Assessment & Plan Note (Signed)
We have been working this up, she did see GI who has put her on medications consistent with H. Pylori (patient does not remember the actual diagnosis but is going to follow-up with them appropriately).  We discussed that due to her history of smoking, she is not ready to quit, that she should go get the low-dose chest CT for lung cancer screening that was ordered a year ago, she says she will could do this now.  Follow-up lab work was unremarkable

## 2020-01-03 ENCOUNTER — Ambulatory Visit: Payer: Medicaid Other

## 2020-01-13 ENCOUNTER — Ambulatory Visit
Admission: RE | Admit: 2020-01-13 | Discharge: 2020-01-13 | Disposition: A | Discharge status: Home / Self Care | Payer: Medicaid Other | Source: Ambulatory Visit | Attending: Family Medicine | Admitting: Family Medicine

## 2020-01-13 ENCOUNTER — Other Ambulatory Visit: Payer: Self-pay

## 2020-01-13 DIAGNOSIS — Z1231 Encounter for screening mammogram for malignant neoplasm of breast: Secondary | ICD-10-CM

## 2020-01-14 ENCOUNTER — Ambulatory Visit (INDEPENDENT_AMBULATORY_CARE_PROVIDER_SITE_OTHER): Payer: Medicaid Other | Admitting: Licensed Clinical Social Worker

## 2020-01-14 ENCOUNTER — Telehealth: Payer: Self-pay

## 2020-01-14 ENCOUNTER — Other Ambulatory Visit: Payer: Self-pay

## 2020-01-14 DIAGNOSIS — F331 Major depressive disorder, recurrent, moderate: Secondary | ICD-10-CM | POA: Diagnosis not present

## 2020-01-14 DIAGNOSIS — F4310 Post-traumatic stress disorder, unspecified: Secondary | ICD-10-CM | POA: Diagnosis not present

## 2020-01-14 DIAGNOSIS — F411 Generalized anxiety disorder: Secondary | ICD-10-CM

## 2020-01-14 NOTE — Telephone Encounter (Signed)
Patient calls nurse line regarding bruising after receiving [employer] Covid vaccination. Patient states that the bruise is round in nature and is starting to fade away. Advised patient that this is a relatively common reaction. Patient declines redness, swelling or warmth. Return precautions given.   Please advise if further recommendations  To PCP  Veronda Prude, RN

## 2020-01-15 ENCOUNTER — Other Ambulatory Visit: Payer: Self-pay | Admitting: Family Medicine

## 2020-01-15 ENCOUNTER — Telehealth: Payer: Self-pay | Admitting: Family Medicine

## 2020-01-15 DIAGNOSIS — R928 Other abnormal and inconclusive findings on diagnostic imaging of breast: Secondary | ICD-10-CM
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
## 2020-01-15 NOTE — Telephone Encounter (Signed)
Called patient and notified her that there was an asymmetry found on her recent screening mammogram.  Of note she did get her Covid shot roughly a week prior, I have instructed her to make sure she calls the breast center to schedule her follow-up imaging and to notify them of the date of her Covid shot.  Dr. Parke Simmers

## 2020-01-19 ENCOUNTER — Ambulatory Visit (HOSPITAL_COMMUNITY)
Admission: EM | Admit: 2020-01-19 | Discharge: 2020-01-19 | Disposition: A | Discharge status: Home / Self Care | Payer: Medicaid Other | Attending: Family Medicine | Admitting: Family Medicine

## 2020-01-19 ENCOUNTER — Telehealth: Payer: Self-pay | Admitting: Family Medicine

## 2020-01-19 ENCOUNTER — Other Ambulatory Visit: Payer: Self-pay

## 2020-01-19 ENCOUNTER — Encounter (HOSPITAL_COMMUNITY): Payer: Self-pay | Admitting: Family Medicine

## 2020-01-19 DIAGNOSIS — M62830 Muscle spasm of back: Secondary | ICD-10-CM

## 2020-01-19 DIAGNOSIS — M25512 Pain in left shoulder: Secondary | ICD-10-CM

## 2020-01-19 DIAGNOSIS — M5441 Lumbago with sciatica, right side: Secondary | ICD-10-CM

## 2020-01-19 MED ORDER — HYDROCODONE-ACETAMINOPHEN 5-325 MG PO TABS: 1.0000 | ORAL_TABLET | Freq: Four times a day (QID) | ORAL | 0 refills | Status: DC | PRN

## 2020-01-19 MED ORDER — METHOCARBAMOL 500 MG PO TABS: 500.0000 mg | ORAL_TABLET | Freq: Three times a day (TID) | ORAL | 0 refills | Status: DC | PRN

## 2020-01-19 MED ORDER — NAPROXEN 500 MG PO TABS: 500.0000 mg | ORAL_TABLET | Freq: Two times a day (BID) | ORAL | 0 refills | Status: DC

## 2020-01-19 NOTE — Telephone Encounter (Signed)
[facility] After Hours Line:  Patient notes she sees Dr. Judeth Cornfield with orthopedics. She received a shoulder steroid injection on 4/12. She also notes she received [employer] vaccination on 01/07/20. She has chronic left shoulder pain. She notes that she has found a "bump" on the top left shoulder that is new but she is starting to have shooting pains down her shoulder and up her neck with certain arm movements. She notes the bruise around her shot has resolved. Denies any swelling or pain in her arm. Denies any difficulty breathing or chest pain. She is very worried about this and has opted to go to urgent care for further evaluation. Given her chronic conditions and inability to evaluate, I feel this is reasonable.  Patient can also schedule a follow-up with PCP for evaluation if she is concerned further.  Patient voiced understanding and agreement with plan.

## 2020-01-19 NOTE — ED Triage Notes (Signed)
Patient c/o left shoulder pain x2 months. Reports her PCP has been treating her for it. Reports she just had a cortisone shot in the area within the last week. Reports when she woke up this morning, there is a small painful bump on the left shoulder that was not there yesterday.

## 2020-01-19 NOTE — Discharge Instructions (Signed)
This could be either a ganglion cyst or a biceps tendonitis.  Please call Dr. Dion Saucier for evaluation tomorrow.

## 2020-01-19 NOTE — ED Provider Notes (Signed)
[facility]    CSN: [id number] Arrival date & time: 01/19/20  1144      History   Chief Complaint Chief Complaint  Patient presents with  . Shoulder Pain    HPI Bridget [name] is a 59 y.o. female.   Initial [facility] patient visit.  Patient presents with left shoulder pain.  Patient c/o left shoulder pain x 2 months. Reports her orthopedist, Dr. Dion Saucier, has been treating her for it. Reports she just had a cortisone shot in the area within the last week. Reports when she woke up this morning, there is a small painful bump on the left shoulder that was not there yesterday.   Patient had x-rays at Dr. Shelba Flake office.     Past Medical History:  Diagnosis Date  . Bipolar II disorder (HCC)   . Community acquired pneumonia of right upper lobe of lung 08/14/2017  . Depression   . Obesity, morbid (HCC)   . Osteoarthritis     Patient Active Problem List   Diagnosis Date Noted  . Adhesive capsulitis of right shoulder 01/02/2020  . High risk social situation 03/26/2019  . Cramp in muscle 12/11/2018  . TSH deficiency 10/12/2018  . Encounter for screening mammogram for malignant neoplasm of breast 10/09/2018  . Unintentional weight loss 10/09/2018  . Prediabetes 10/08/2018  . Possible exposure to STD 06/13/2018  . Weight loss 06/13/2018  . Lower respiratory infection 12/21/2017  . Foot pain, bilateral 10/27/2017  . Routine screening for STI (sexually transmitted infection) 10/27/2017  . Gait instability 10/27/2017  . Lesion of stomach 09/07/2017  . Nonspecific abnormal findings on imaging of lung 08/21/2017  . Mood disorder (HCC)   . Chronic pain syndrome   . Anxiety state 03/07/2017  . Primary localized osteoarthritis of left hip 02/07/2017  . Fibromyalgia 05/15/2014  . Colonoscopy refused 05/15/2014  . Healthcare maintenance 05/15/2014  . Schizoaffective disorder (HCC) 12/13/2012  . Other and unspecified hyperlipidemia 12/13/2012  . Plantar fasciitis  04/16/2012  . Primary osteoarthritis of right hip 08/15/2011  . Obesity, morbid (HCC)   . Bipolar 2 disorder (HCC) 06/15/2011  . TOBACCO DEPENDENCE 11/30/2006  . OSTEOARTHRITIS OF SPINE, NOS 11/30/2006  . INCONTINENCE, URGE 11/30/2006    Past Surgical History:  Procedure Laterality Date  . BONE SPURS     REMOVED FROM SHOULDERS  . EYE SURGERY     STY REMOVED LEFT EYE  1992  . ROTATOR CUFF REPAIR     BILATERAL  . TOTAL HIP ARTHROPLASTY  08/15/2011   Procedure: TOTAL HIP ARTHROPLASTY;  Surgeon: Eulas Post;  Location: [facility];  Service: Orthopedics;  Laterality: Right;  . TOTAL HIP ARTHROPLASTY Left 02/07/2017  . TOTAL HIP ARTHROPLASTY Left 02/07/2017   Procedure: LEFT TOTAL HIP ARTHROPLASTY;  Surgeon: Teryl Lucy, MD;  Location: [facility] OR;  Service: Orthopedics;  Laterality: Left;    OB History   No obstetric history on file.      Home Medications    Prior to Admission medications   Medication Sig Start Date End Date Taking? Authorizing Provider  escitalopram (LEXAPRO) 10 MG tablet 5 mg daily for one week, then 10 mg daily 03/27/19   Neysa Hotter, MD  fluticasone (FLONASE) 50 MCG/ACT nasal spray Place 2 sprays into both nostrils daily. 12/22/17   Marthenia Rolling, DO  HYDROcodone-acetaminophen (NORCO) 5-325 MG tablet Take 1 tablet by mouth every 6 (six) hours as needed for moderate pain. 01/19/20   Elvina Sidle, MD  methocarbamol (ROBAXIN) 500 MG tablet  Take 1 tablet (500 mg total) by mouth every 8 (eight) hours as needed for muscle spasms. 01/19/20   Elvina Sidle, MD  naproxen (NAPROSYN) 500 MG tablet Take 1 tablet (500 mg total) by mouth 2 (two) times daily with a meal. 01/19/20   Elvina Sidle, MD  Nutritional Supplements (ENSURE COMPLETE SHAKE) LIQD Take 1 Bottle by mouth daily as needed. 10/08/18   Marthenia Rolling, DO  nystatin cream (MYCOSTATIN) Apply 1 application topically 2 (two) times daily. x2 weeks Patient not taking: Reported on 12/21/2017 03/07/17   Raliegh Ip,  DO    Family History Family History  Problem Relation Age of Onset  . Bipolar disorder Sister   . Hypertension Sister   . Hypertension Brother   . Diabetes Brother   . Heart disease Brother   . Hypertension Other   . Hypertension Mother   . Rheum arthritis Mother   . Heart disease Mother   . Hypertension Maternal Aunt   . Cancer Father   . Asthma Daughter   . Asthma Son     Social History Social History   Tobacco Use  . Smoking status: Current Every Day Smoker    Packs/day: 0.50    Years: 20.00    Pack years: 10.00    Types: Cigarettes  . Smokeless tobacco: Former Neurosurgeon    Quit date: 02/06/2017  Substance Use Topics  . Alcohol use: No  . Drug use: Yes    Types: Marijuana     Allergies   Lyrica [pregabalin], Neurontin [gabapentin], and Tramadol   Review of Systems Review of Systems  All other systems reviewed and are negative.    Physical Exam Triage Vital Signs ED Triage Vitals  Enc Vitals Group     BP      Pulse      Resp      Temp      Temp src      SpO2      Weight      Height      Head Circumference      Peak Flow      Pain Score      Pain Loc      Pain Edu?      Excl. in GC?    No data found.  Updated Vital Signs BP 121/69 (BP Location: Right Arm)   Pulse 76   Temp 98.6 F (37 C) (Oral)   Resp 18   LMP  (LMP Unknown)   SpO2 100%    Physical Exam Vitals and nursing note reviewed.  Constitutional:      Appearance: Normal appearance. She is normal weight.  HENT:     Head: Normocephalic.  Cardiovascular:     Rate and Rhythm: Normal rate.  Pulmonary:     Effort: Pulmonary effort is normal.  Musculoskeletal:        General: Swelling and tenderness present. No deformity or signs of injury.     Cervical back: Normal range of motion and neck supple.     Comments: Small tender firm nodule just proximal to [facility] joint left shoulder that is normal color and temperature.  Pain with abduction of right shoulder.  Pain in bicipital groove.    Skin:    General: Skin is warm and dry.  Neurological:     General: No focal deficit present.     Mental Status: She is alert.  Psychiatric:        Mood and Affect: Mood normal.  Behavior: Behavior normal.        Thought Content: Thought content normal.        Judgment: Judgment normal.      UC Treatments / Results  Labs (all labs ordered are listed, but only abnormal results are displayed) Labs Reviewed - No data to display  EKG   Radiology No results found.  Procedures Procedures (including critical care time)  Medications Ordered in UC Medications - No data to display  Initial Impression / Assessment and Plan / UC Course  I have reviewed the triage vital signs and the nursing notes.  Pertinent labs & imaging results that were available during my care of the patient were reviewed by me and considered in my medical decision making (see chart for details).    Final Clinical Impressions(s) / UC Diagnoses   Final diagnoses:  Acute pain of left shoulder     Discharge Instructions     This could be either a ganglion cyst or a biceps tendonitis.  Please call Dr. Mardelle Matte for evaluation tomorrow.    ED Prescriptions    Medication Sig Dispense Auth. Provider   naproxen (NAPROSYN) 500 MG tablet Take 1 tablet (500 mg total) by mouth 2 (two) times daily with a meal. 28 tablet Robyn Haber, MD   methocarbamol (ROBAXIN) 500 MG tablet Take 1 tablet (500 mg total) by mouth every 8 (eight) hours as needed for muscle spasms. 30 tablet Robyn Haber, MD   HYDROcodone-acetaminophen (NORCO) 5-325 MG tablet Take 1 tablet by mouth every 6 (six) hours as needed for moderate pain. 12 tablet Robyn Haber, MD     I have reviewed the PDMP during this encounter.   Robyn Haber, MD 01/19/20 1311

## 2020-01-22 ENCOUNTER — Other Ambulatory Visit: Payer: Self-pay | Admitting: Orthopedic Surgery

## 2020-01-22 DIAGNOSIS — M25512 Pain in left shoulder: Secondary | ICD-10-CM

## 2020-01-28 ENCOUNTER — Encounter: Payer: Self-pay | Admitting: Family Medicine

## 2020-01-28 ENCOUNTER — Ambulatory Visit (INDEPENDENT_AMBULATORY_CARE_PROVIDER_SITE_OTHER): Payer: Medicaid Other | Admitting: Licensed Clinical Social Worker

## 2020-01-28 ENCOUNTER — Other Ambulatory Visit: Payer: Self-pay

## 2020-01-28 ENCOUNTER — Other Ambulatory Visit: Payer: Medicaid Other

## 2020-01-28 DIAGNOSIS — F4310 Post-traumatic stress disorder, unspecified: Secondary | ICD-10-CM | POA: Diagnosis not present

## 2020-01-28 DIAGNOSIS — F411 Generalized anxiety disorder: Secondary | ICD-10-CM | POA: Diagnosis not present

## 2020-01-28 DIAGNOSIS — F331 Major depressive disorder, recurrent, moderate: Secondary | ICD-10-CM | POA: Diagnosis not present

## 2020-01-29 ENCOUNTER — Ambulatory Visit: Admission: RE | Admit: 2020-01-29 | Payer: Medicaid Other | Source: Ambulatory Visit

## 2020-01-29 ENCOUNTER — Other Ambulatory Visit: Payer: Self-pay

## 2020-01-29 ENCOUNTER — Ambulatory Visit
Admission: RE | Admit: 2020-01-29 | Discharge: 2020-01-29 | Disposition: A | Discharge status: Home / Self Care | Payer: Medicaid Other | Source: Ambulatory Visit | Attending: Family Medicine | Admitting: Family Medicine

## 2020-01-29 DIAGNOSIS — R928 Other abnormal and inconclusive findings on diagnostic imaging of breast: Secondary | ICD-10-CM

## 2020-01-30 NOTE — Progress Notes (Signed)
   THERAPIST PROGRESS NOTE  Session Time: 2pm-3pm  Participation Level: Active  Behavioral Response: NeatAlertEuthymic  Type of Therapy: Individual Therapy  Treatment Goals addressed: Coping  Interventions: Motivational Interviewing, Solution Focused and Supportive  Summary: Client presented for scheduled in person session within the clinic. Client actively engaged in check in and was tearful as she provided an update on her day which included supporting her daughter in inquiring on details of her father's death many years ago. Client reflected on and processed emotions related to the place she was in during that time and reported that she was in active addiction during that period of life. Client reported that while she has continued to attend frequent medical appointments, she has managed to still provide support to her children. Client discussed times in life in which she has offered assistance to others by sharing her past experiences. Client's mood improved through processing as she began to smile and acknowledge the growth she has had over the last year however acknowledges this month as particularly triggering due to multiple anniversary dates.  Suicidal/Homicidal: Nowithout intent/plan  Therapist Response: Clinician joined client for session and facilitated check in assessing recent stressors, changes in mood, and SI/HI/psychosis. Clinician utilized open ended questions to prompt discussion on identified triggering event that occurred today and provided support throughout session. Clinician validated client's feelings and acknowledged client as a caretaker through her own struggles. Clinician engaged in processing of client's progress over the last year while discussing the impact of significant dates on client's mood and functioning.  Plan: Return again in 2-4 weeks.  Diagnosis: Axis I: PTSD, MDD, GAD       Francine Graven, LCSW 01/30/2020

## 2020-02-04 ENCOUNTER — Encounter: Payer: Self-pay | Admitting: Family Medicine

## 2020-02-06 ENCOUNTER — Other Ambulatory Visit: Payer: Self-pay

## 2020-02-06 ENCOUNTER — Ambulatory Visit
Admission: RE | Admit: 2020-02-06 | Discharge: 2020-02-06 | Disposition: A | Discharge status: Home / Self Care | Payer: Medicaid Other | Source: Ambulatory Visit | Attending: Orthopedic Surgery | Admitting: Orthopedic Surgery

## 2020-02-06 DIAGNOSIS — M25512 Pain in left shoulder: Secondary | ICD-10-CM

## 2020-02-06 MED ORDER — IOPAMIDOL (ISOVUE-M 200) INJECTION 41%
12.0000 mL | Freq: Once | INTRAMUSCULAR | Status: AC
  Administered 2020-02-06: 16:00:00 12 mL via INTRA_ARTICULAR

## 2020-02-11 ENCOUNTER — Ambulatory Visit (INDEPENDENT_AMBULATORY_CARE_PROVIDER_SITE_OTHER): Payer: Medicaid Other | Admitting: Licensed Clinical Social Worker

## 2020-02-11 ENCOUNTER — Other Ambulatory Visit: Payer: Self-pay

## 2020-02-11 ENCOUNTER — Telehealth: Payer: Self-pay | Admitting: Family Medicine

## 2020-02-11 DIAGNOSIS — F4310 Post-traumatic stress disorder, unspecified: Secondary | ICD-10-CM

## 2020-02-11 DIAGNOSIS — F331 Major depressive disorder, recurrent, moderate: Secondary | ICD-10-CM | POA: Diagnosis not present

## 2020-02-11 DIAGNOSIS — F411 Generalized anxiety disorder: Secondary | ICD-10-CM | POA: Diagnosis not present

## 2020-02-11 DIAGNOSIS — R1310 Dysphagia, unspecified: Secondary | ICD-10-CM

## 2020-02-11 DIAGNOSIS — F17200 Nicotine dependence, unspecified, uncomplicated: Secondary | ICD-10-CM

## 2020-02-11 NOTE — Telephone Encounter (Signed)
Contacted pt and scheduled her an in person visit for 02/19/20 @1 :30pm.  While on the phone she mentioned that she was still waiting on her CT.  Please advise and I will let pt know that status of this.Delmy Holdren Zimmerman Rumple, CMA

## 2020-02-11 NOTE — Telephone Encounter (Signed)
Patient called requesting doctor to call her to discuss some issues they have talked about; ph # [phone]

## 2020-02-13 NOTE — Telephone Encounter (Signed)
Tried to call and schedule the CT and the one that is in there for soft tissue neck is over a year old, and from previous visit note it looks like it supposed to be a low dose chest CT. Routing to PCP for clarification and for a new order to be placed with the correct order which ever one it is supposed to be.Tieler Cournoyer Zimmerman Rumple, CMA

## 2020-02-13 NOTE — Telephone Encounter (Signed)
Contacted pt to let her know that her CT appointment is on 02/20/20 @ [facility], she is to arrive at 3:45pm.Hayes Rehfeldt Lamonte Sakai, CMA

## 2020-02-13 NOTE — Telephone Encounter (Signed)
New order placed for low dose chest ct and separate for ct neck ] -Dr. Parke Simmers

## 2020-02-13 NOTE — Progress Notes (Signed)
   THERAPIST PROGRESS NOTE  Session Time: 2:00-3:00pm  Participation Level: Active  Behavioral Response: NeatAlertAnxious and Depressed  Type of Therapy: Individual Therapy  Treatment Goals addressed: Anxiety and Coping  Interventions: Motivational Interviewing, Strength-based and Supportive  Summary: Client presented in person for scheduled session. Client presented as tearful and frequently walking throughout the room as she discussed recent stressor related to concerns of her sister's mental health. Client explained that she made attempt to "get her help" however other family member's weren't pleased with the means in which Bridget [name] went about doing this. Client was tearful in explaining that she has always felt like a caretaker for her sister, family members, and children, and relayed that she plans "cut the cord". Bridget [name] processed feelings regarding her no longer providing the level of support to others however still providing love and spending time with them. Client discussed many topics throughout session including processing feelings related to her ex-husband and his new relationship. Client processed thoughts and feelings regarding her statement of "I wasn't good enough for him". Client denied any SI/HI/psychosis during this engagement and will return again in 2 weeks.  Suicidal/Homicidal: Nowithout intent/plan  Therapist Response: Clinician joined client for scheduled session and facilitated check in assessing recent stressors and acknowledging client's tearful, anxious presentation. Utilized validation and supportive statements throughout session and allowed space for client to process feelings. Clinician inquired on means in which client is taking care of herself and continuing to attend to her medical concerns. Clinician engaged in discussion on boundaries with loved ones and the importance of maintaining care of self while still engaging in relationships and supporting others. Clinician  will follow up with client again in around 2 weeks.  Plan: Return again in 2-3 weeks.  Diagnosis: Axis I: PTSD, MDD, GAD       Francine Graven, LCSW 02/13/2020

## 2020-02-19 ENCOUNTER — Other Ambulatory Visit: Payer: Self-pay

## 2020-02-19 ENCOUNTER — Ambulatory Visit (INDEPENDENT_AMBULATORY_CARE_PROVIDER_SITE_OTHER): Payer: Medicaid Other | Admitting: Family Medicine

## 2020-02-19 ENCOUNTER — Ambulatory Visit: Payer: Medicaid Other | Admitting: Family Medicine

## 2020-02-19 ENCOUNTER — Encounter: Payer: Self-pay | Admitting: Family Medicine

## 2020-02-19 VITALS — BP 136/76 | HR 83 | Ht 61.0 in | Wt 132.4 lb

## 2020-02-19 DIAGNOSIS — R252 Cramp and spasm: Secondary | ICD-10-CM

## 2020-02-19 NOTE — Patient Instructions (Signed)
Today we talked about your neck pain, we are going to take a look at the CT imaging to see if there is any indication and therefore why your neck hurts.  We think you should discuss the utility of an MRI with Bridget [name] when you go see them next.  I do think you should continue with therapy as you do not want to use medication for your depression.  I am going to order a blood test called a BM P to check for potential causes of your cramping.  I will call and let you know if there is anything we need to do  Dr. Parke Simmers

## 2020-02-20 ENCOUNTER — Ambulatory Visit (HOSPITAL_COMMUNITY)
Admission: RE | Admit: 2020-02-20 | Discharge: 2020-02-20 | Disposition: A | Discharge status: Home / Self Care | Payer: Medicaid Other | Source: Ambulatory Visit | Attending: Family Medicine | Admitting: Family Medicine

## 2020-02-20 DIAGNOSIS — F17200 Nicotine dependence, unspecified, uncomplicated: Secondary | ICD-10-CM | POA: Diagnosis present

## 2020-02-20 DIAGNOSIS — R1310 Dysphagia, unspecified: Secondary | ICD-10-CM

## 2020-02-20 LAB — BASIC METABOLIC PANEL
BUN/Creatinine Ratio: 10 (ref 9–23)
BUN: 7 mg/dL (ref 6–24)
CO2: 21 mmol/L (ref 20–29)
Calcium: 10.2 mg/dL (ref 8.7–10.2)
Chloride: 107 mmol/L — ABNORMAL HIGH (ref 96–106)
Creatinine, Ser: 0.7 mg/dL (ref 0.57–1.00)
GFR calc Af Amer: 111 mL/min/{1.73_m2} (ref >59)
GFR calc non Af Amer: 96 mL/min/{1.73_m2} (ref >59)
Glucose: 86 mg/dL (ref 65–99)
Potassium: 4.3 mmol/L (ref 3.5–5.2)
Sodium: 142 mmol/L (ref 134–144)

## 2020-02-21 ENCOUNTER — Encounter: Payer: Self-pay | Admitting: Family Medicine

## 2020-02-21 NOTE — Progress Notes (Signed)
    SUBJECTIVE:   CHIEF COMPLAINT / HPI: Leg cramping  Patient with complaints of leg cramping for the last 2 days, just feels tight.  No recent injuries, has been recurring over the past number of years for her.  She did also of note have a recent dental surgery today.  She is under long-term evaluation for some dysphagia issues for weight loss.    PERTINENT  PMH / PSH:   OBJECTIVE:   BP 136/76   Pulse 83   Ht 5\' 1"  (1.549 m)   Wt 132 lb 6.4 oz (60.1 kg)   LMP  (LMP Unknown)   SpO2 98%   BMI 25.02 kg/m   General: Alert and pleasant, no distress, still with swelling of mouth from dental surgery Cardiac: Regular rate and rhythm, no murmur Respiratory: There to auscultation bilaterally, no increased work of breathing, no cough no wheeze Msk: Increased tonicity to right hamstring, somewhat tender to deep palpation, no skin changes or range of motion restrictions, no indication of injury or cellulitis or infection   ASSESSMENT/PLAN:   Cramp in muscle Continue work-up for unintentional weight loss, CT soft tissue for neck and CT chest low-dose for lung cancer screening are scheduled tomorrow, can draw BMP today to evaluate for metabolic disturbance       , DO [facility]

## 2020-02-21 NOTE — Assessment & Plan Note (Signed)
Continue work-up for unintentional weight loss, CT soft tissue for neck and CT chest low-dose for lung cancer screening are scheduled tomorrow, can draw BMP today to evaluate for metabolic disturbance

## 2020-02-25 ENCOUNTER — Ambulatory Visit (INDEPENDENT_AMBULATORY_CARE_PROVIDER_SITE_OTHER): Payer: Medicaid Other | Admitting: Licensed Clinical Social Worker

## 2020-02-25 ENCOUNTER — Other Ambulatory Visit: Payer: Self-pay

## 2020-02-25 DIAGNOSIS — F4310 Post-traumatic stress disorder, unspecified: Secondary | ICD-10-CM | POA: Diagnosis not present

## 2020-02-25 DIAGNOSIS — F331 Major depressive disorder, recurrent, moderate: Secondary | ICD-10-CM | POA: Diagnosis not present

## 2020-02-25 DIAGNOSIS — F411 Generalized anxiety disorder: Secondary | ICD-10-CM

## 2020-02-26 NOTE — Progress Notes (Signed)
   THERAPIST PROGRESS NOTE  Session Time: 2pm-3pm  Participation Level: Active  Behavioral Response: NeatAlertEuthymic  Type of Therapy: Individual Therapy  Treatment Goals addressed: Coping  Interventions: Motivational Interviewing, Strength-based and Supportive  Summary: Client presented on time and alert and oriented for in-person session. Client began session by reporting "I'm doing better today" and provided update on overall functioning since last session. Client relayed that she was able to spend time with family prior to this session and enjoyed reflecting on her family history and memories with various family members who have passed away. Client reported finding value in knowing and passing down family roots. Client was tearful in discussing various moments in time with family members however reported that she has felt "better" overall within the last few weeks. 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 Client agreed to transition back to her primary therapist who is returning from leave next week. Client denied SI/HI/psychosis during this engagement.  Suicidal/Homicidal: Nowithout intent/plan  Therapist Response: Clinician joined client for session facilitated check in assessing recent stressors, changes in mood, and SI/HI/psychosis. Clinician allowed space for client to process her agenda and utilized supportive statements throughout session. Clinician validated feelings and utilized MI, OARS regarding client's enjoyment in reflecting on family hx and her desire to pass these memories on to younger family members. Clinician prompted discussion on client's thoughts towards transitioning back to her primary therapist and what this looks like regarding getting her scheduled with [facility].   Plan: Client to follow up with primary therapist B. Morris who will return to office from leave next week.  Diagnosis: Axis I:  PTSD        Francine Graven, LCSW 02/26/2020

## 2020-03-04 ENCOUNTER — Emergency Department (HOSPITAL_COMMUNITY)
Admission: EM | Admit: 2020-03-04 | Discharge: 2020-03-04 | Disposition: A | Discharge status: Home / Self Care | Payer: Medicaid Other | Attending: Emergency Medicine | Admitting: Emergency Medicine

## 2020-03-04 ENCOUNTER — Telehealth: Payer: Self-pay | Admitting: Family Medicine

## 2020-03-04 ENCOUNTER — Encounter (HOSPITAL_COMMUNITY): Payer: Self-pay | Admitting: Emergency Medicine

## 2020-03-04 ENCOUNTER — Other Ambulatory Visit: Payer: Self-pay

## 2020-03-04 DIAGNOSIS — F17210 Nicotine dependence, cigarettes, uncomplicated: Secondary | ICD-10-CM | POA: Diagnosis not present

## 2020-03-04 DIAGNOSIS — M25512 Pain in left shoulder: Secondary | ICD-10-CM | POA: Insufficient documentation

## 2020-03-04 DIAGNOSIS — G8929 Other chronic pain: Secondary | ICD-10-CM | POA: Insufficient documentation

## 2020-03-04 DIAGNOSIS — Z96643 Presence of artificial hip joint, bilateral: Secondary | ICD-10-CM | POA: Diagnosis not present

## 2020-03-04 MED ORDER — CYCLOBENZAPRINE HCL 10 MG PO TABS
5.0000 mg | ORAL_TABLET | Freq: Once | ORAL | Status: AC
  Administered 2020-03-04: 5 mg via ORAL
  Filled 2020-03-04: qty 1

## 2020-03-04 MED ORDER — CYCLOBENZAPRINE HCL 5 MG PO TABS: 5.0000 mg | ORAL_TABLET | Freq: Two times a day (BID) | ORAL | 0 refills | Status: AC | PRN

## 2020-03-04 NOTE — ED Provider Notes (Signed)
[facility] EMERGENCY DEPARTMENT Provider Note   CSN: [id number] Arrival date & time: 03/04/20  1908     History Chief Complaint  Patient presents with   Back Pain    Bridget [name] is a 59 y.o. female.  HPI   Patient presents emerged prior with chief complaint of left shoulder pain.  Pain has been going on for the last few weeks but states that over the last couple days she has felt some pressure on her left shoulder.  Patient states the pressure turns into pain and she will feel it into her neck.  She states that walking, moving her neck, moving her left arm seems to make the pressure worse.  Nothing seems to make pressure go away.  Patient admits to some occasional neck pain, headache, chest pain, shortness of breath.  Patient recently had a MR of her shoulder that showed a full-thickness retracted supraspinatus tendon tear.  She is scheduled to go to surgery with Dr. Evlyn Clines on 08/10.  patient denies fever, chills, nasal congestion, sore throat, cough, nausea, vomiting, diarrhea, difficulty urinating, leg edema.  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   High risk social situation 03/26/2019   Cramp in muscle 12/11/2018   TSH deficiency 10/12/2018   Encounter for screening mammogram for malignant neoplasm of breast 10/09/2018   Unintentional weight loss 10/09/2018   Prediabetes 10/08/2018   Possible exposure to STD 06/13/2018   Weight loss 06/13/2018   Lower respiratory infection 12/21/2017   Foot pain, bilateral 10/27/2017   Routine screening for STI (sexually transmitted infection) 10/27/2017   Gait instability 10/27/2017   Lesion of stomach 09/07/2017   Nonspecific  abnormal findings on imaging of lung 08/21/2017   Mood disorder (HCC)    Chronic pain syndrome    Anxiety state 03/07/2017   Primary localized osteoarthritis of left hip 02/07/2017   Fibromyalgia 05/15/2014   Colonoscopy refused 05/15/2014   Healthcare maintenance 05/15/2014   Schizoaffective disorder (HCC) 12/13/2012   Other and unspecified hyperlipidemia 12/13/2012   Plantar fasciitis 04/16/2012   Primary osteoarthritis of right hip 08/15/2011   Obesity, morbid (HCC)    Bipolar 2 disorder (HCC) 06/15/2011   TOBACCO DEPENDENCE 11/30/2006   OSTEOARTHRITIS OF SPINE, NOS 11/30/2006   INCONTINENCE, URGE 11/30/2006    Past Surgical History:  Procedure Laterality Date   BONE SPURS     REMOVED FROM SHOULDERS   EYE SURGERY     STY REMOVED LEFT EYE  1992   ROTATOR CUFF REPAIR     BILATERAL   TOTAL HIP ARTHROPLASTY  08/15/2011   Procedure: TOTAL HIP ARTHROPLASTY;  Surgeon: Eulas Post;  Location: [facility] OR;  Service: Orthopedics;  Laterality: Right;   TOTAL HIP ARTHROPLASTY Left 02/07/2017   TOTAL HIP ARTHROPLASTY Left 02/07/2017   Procedure: LEFT TOTAL HIP ARTHROPLASTY;  Surgeon: Teryl Lucy, MD;  Location: [facility] OR;  Service: Orthopedics;  Laterality: Left;     OB History   No obstetric history on file.     Family History  Problem Relation Age of Onset   Bipolar disorder Sister    Hypertension Sister    Hypertension Brother    Diabetes Brother  Heart disease Brother    Hypertension Other    Hypertension Mother    Rheum arthritis Mother    Heart disease Mother    Hypertension Maternal Aunt    Cancer Father    Asthma Daughter    Asthma Son     Social History   Tobacco Use   Smoking status: Current Every Day Smoker    Packs/day: 0.50    Years: 20.00    Pack years: 10.00    Types: Cigarettes   Smokeless tobacco: Former Neurosurgeon    Quit date: 02/06/2017  Substance Use Topics   Alcohol use: No   Drug use: Yes    Types: Marijuana     Home Medications Prior to Admission medications   Medication Sig Start Date End Date Taking? Authorizing Provider  cyclobenzaprine (FLEXERIL) 5 MG tablet Take 1 tablet (5 mg total) by mouth 2 (two) times daily as needed for up to 10 days for muscle spasms. 03/04/20 03/14/20  Carroll Sage, PA-C  fluticasone (FLONASE) 50 MCG/ACT nasal spray Place 2 sprays into both nostrils daily. 12/22/17   Marthenia Rolling, DO  methocarbamol (ROBAXIN) 500 MG tablet Take 1 tablet (500 mg total) by mouth every 8 (eight) hours as needed for muscle spasms. 01/19/20   Elvina Sidle, MD  naproxen (NAPROSYN) 500 MG tablet Take 1 tablet (500 mg total) by mouth 2 (two) times daily with a meal. 01/19/20   Elvina Sidle, MD  Nutritional Supplements (ENSURE COMPLETE SHAKE) LIQD Take 1 Bottle by mouth daily as needed. 10/08/18   Marthenia Rolling, DO    Allergies    Lyrica [pregabalin], Neurontin [gabapentin], and Tramadol  Review of Systems   Review of Systems  Constitutional: Negative for chills and fever.  HENT: Negative for congestion and sore throat.   Eyes: Negative for visual disturbance.  Respiratory: Negative for cough and shortness of breath.   Cardiovascular: Negative for chest pain and leg swelling.  Gastrointestinal: Negative for abdominal pain, diarrhea, nausea and vomiting.  Genitourinary: Negative for enuresis and flank pain.  Musculoskeletal: Negative for back pain.       Patient has left shoulder pain is going on for last few months.   Skin: Negative for rash.  Neurological: Negative for dizziness, numbness and headaches.  Hematological: Does not bruise/bleed easily.    Physical Exam Updated Vital Signs BP 123/80 (BP Location: Right Arm)    Pulse (!) 55    Temp (!) 97.2 F (36.2 C) (Oral)    Resp 16    Ht 5\' 1"  (1.549 m)    Wt 59.2 kg    LMP  (LMP Unknown)    SpO2 98%    BMI 24.68 kg/m   Physical Exam Vitals and nursing note reviewed.  Constitutional:      General: She is not in acute  distress.    Appearance: She is not ill-appearing.  HENT:     Head: Normocephalic and atraumatic.     Nose: No congestion.     Mouth/Throat:     Mouth: Mucous membranes are moist.     Pharynx: Oropharynx is clear.  Eyes:     General: No scleral icterus. Cardiovascular:     Rate and Rhythm: Normal rate and regular rhythm.     Pulses: Normal pulses.     Heart sounds: No murmur. No friction rub. No gallop.   Pulmonary:     Effort: No respiratory distress.     Breath sounds: No wheezing, rhonchi or rales.  Abdominal:  General: There is no distension.     Tenderness: There is no abdominal tenderness. There is no guarding.  Musculoskeletal:        General: No swelling.     Comments: Patient has tenderness upon palpation on his left shoulder.  Patient had decreased range of motion of the left shoulder, good strength, good sensation, good cap refill, good radial pulse.  There is no abnormalities noted, no rash, abnormalities noted.  Skin:    General: Skin is warm and dry.     Capillary Refill: Capillary refill takes less than 2 seconds.     Findings: No rash.  Neurological:     Mental Status: She is alert and oriented to person, place, and time.  Psychiatric:        Mood and Affect: Mood normal.     ED Results / Procedures / Treatments   Labs (all labs ordered are listed, but only abnormal results are displayed) Labs Reviewed - No data to display  EKG None  Radiology No results found.  Procedures Procedures (including critical care time)  Medications Ordered in ED Medications  cyclobenzaprine (FLEXERIL) tablet 5 mg (5 mg Oral Given 03/04/20 2229)    ED Course  I have reviewed the triage vital signs and the nursing notes.  Pertinent labs & imaging results that were available during my care of the patient were reviewed by me and considered in my medical decision making (see chart for details).    MDM Rules/Calculators/A&P                      I have personally  reviewed all imaging, labs and have interpreted them.  Patient presents with left shoulder pain.  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  Also had a MR of the left shoulder showing a full-thickness retracted supraspinatus tendon tear and is scheduled for surgery on 08/10 with Dr. Juanito Doom.  At this time further imaging is not indicated as  physical exam did not reveal any significant findings, there was no rash, deformities, or abnormalities noted.  She has decreased range of motion but has good strength, sensation, capillary refill, radial pulse in her left arm.  I also have low suspicion for septic joint as the patient denies IV drug use, no findings on skin exam and she does not have a murmur.   patient is able to move her neck without difficulty, she is not complaining of any headache, visual changes, no chest pain, or shortness of breath.  I have low suspicion that the patient pain is a cardiac in nature as she denies any nausea, vomiting, chest pain, shortness of breath, vital signs have remained stable.   Is likely that her pain is a result of her supraspinatus tear, she will be given some Flexeril for pain, given at home instructions, and strict return precautions.  Patient was discussed with attending he agrees with exam and plan.  Patient was discussed results and plan and she verbalized that she understood some and agrees with above plan.  Final Clinical Impression(s) / ED Diagnoses Final diagnoses:  Chronic left shoulder pain    Rx / DC Orders ED Discharge Orders         Ordered    cyclobenzaprine (FLEXERIL) 5 MG tablet  2 times daily PRN     03/04/20 2223  Carroll Sage, PA-C 03/04/20 2238    Maia Plan, MD 03/07/20 [phone]

## 2020-03-04 NOTE — Discharge Instructions (Addendum)
You have been seen for left shoulder pain.  I have prescribed you Flexeril please take as prescribed.  Do not operate machinery or drive while taking this medication.  Do not combine this with alcohol as this can have adverse effects on you.  I also recommend that you apply heat to the area and try to stretch it especially can help with the pain.  You may also alternate between taking ibuprofen and Tylenol every 6 hours.   I want you to follow-up with your orthopedic doctor for further evaluation and management of your right shoulder pain.    I want you to come back to emergency department if you develop shortness of breath, chest pain, fever, chills, nausea, vomiting, fatigue as the symptoms require further evaluation.

## 2020-03-04 NOTE — Telephone Encounter (Signed)
For documentation purposes.  Received phone call from radiology department at [facility].  There was some sort of discrepancy and the recent chest CT for lung cancer screening was somehow incomplete.  Radiology, I believe appropriately, indicated they would not provide a read on an incomplete scan and wanted to have all of the information necessary to assess her for cancer and so [name] will be called back in for a rescan with no cost to herself  Dr. Parke Simmers

## 2020-03-04 NOTE — ED Triage Notes (Signed)
Pt reports having upper back pain that is going into the bottom of her neck over into her left shoulder. Pt also complains of being about to palpate a knot on top of her left shoulder. Pt state she does have some chronic back issues and just recently had a ct of her neck.

## 2020-03-04 NOTE — Telephone Encounter (Signed)
Bridget [name] was called and she reported she has been experiencing worsened chronic pain and is on her way to the emergency room.  She reports that she has incredibly painful chronic pain in her neck which is led to some face and arm numbness and discomfort.  This pain has been going on for years but she has noticed more intense symptoms in the past week or so.  Today, she noticed her discomfort leading to shortness of breath and inability to sleep.  She reported that she is on the way to the hospital for further evaluation and treatment.  She has regular follow-up with an orthopedist and Bridget [name] in the outpatient setting.  She was encouraged to continue to the emergency department where she might be able to obtain some relief for this evening.

## 2020-03-06 ENCOUNTER — Other Ambulatory Visit: Payer: Self-pay

## 2020-03-06 ENCOUNTER — Other Ambulatory Visit (HOSPITAL_COMMUNITY): Payer: Self-pay | Admitting: Family Medicine

## 2020-03-06 ENCOUNTER — Ambulatory Visit (INDEPENDENT_AMBULATORY_CARE_PROVIDER_SITE_OTHER): Payer: Medicaid Other | Admitting: Family Medicine

## 2020-03-06 ENCOUNTER — Emergency Department (HOSPITAL_COMMUNITY)
Admission: EM | Admit: 2020-03-06 | Discharge: 2020-03-07 | Disposition: A | Discharge status: Home / Self Care | Payer: Medicaid Other | Attending: Emergency Medicine | Admitting: Emergency Medicine

## 2020-03-06 ENCOUNTER — Ambulatory Visit (HOSPITAL_COMMUNITY)
Admission: RE | Admit: 2020-03-06 | Discharge: 2020-03-06 | Disposition: A | Discharge status: Home / Self Care | Payer: Medicaid Other | Source: Ambulatory Visit | Attending: Family Medicine | Admitting: Family Medicine

## 2020-03-06 ENCOUNTER — Encounter (HOSPITAL_COMMUNITY): Payer: Self-pay | Admitting: Emergency Medicine

## 2020-03-06 ENCOUNTER — Encounter: Payer: Self-pay | Admitting: Family Medicine

## 2020-03-06 VITALS — BP 120/60 | HR 80 | Ht 61.0 in | Wt 127.0 lb

## 2020-03-06 DIAGNOSIS — R42 Dizziness and giddiness: Secondary | ICD-10-CM

## 2020-03-06 DIAGNOSIS — F1290 Cannabis use, unspecified, uncomplicated: Secondary | ICD-10-CM | POA: Insufficient documentation

## 2020-03-06 DIAGNOSIS — R55 Syncope and collapse: Secondary | ICD-10-CM

## 2020-03-06 DIAGNOSIS — F17200 Nicotine dependence, unspecified, uncomplicated: Secondary | ICD-10-CM

## 2020-03-06 DIAGNOSIS — R0789 Other chest pain: Secondary | ICD-10-CM | POA: Insufficient documentation

## 2020-03-06 DIAGNOSIS — F17210 Nicotine dependence, cigarettes, uncomplicated: Secondary | ICD-10-CM | POA: Diagnosis not present

## 2020-03-06 DIAGNOSIS — R0602 Shortness of breath: Secondary | ICD-10-CM | POA: Insufficient documentation

## 2020-03-06 DIAGNOSIS — E876 Hypokalemia: Secondary | ICD-10-CM | POA: Diagnosis not present

## 2020-03-06 DIAGNOSIS — R2689 Other abnormalities of gait and mobility: Secondary | ICD-10-CM

## 2020-03-06 DIAGNOSIS — R0600 Dyspnea, unspecified: Secondary | ICD-10-CM

## 2020-03-06 DIAGNOSIS — R0609 Other forms of dyspnea: Secondary | ICD-10-CM

## 2020-03-06 LAB — POCT HEMOGLOBIN: Hemoglobin: 12.6 g/dL (ref 11–14.6)

## 2020-03-06 MED ORDER — SODIUM CHLORIDE 0.9% FLUSH: 3.0000 mL | Freq: Once | INTRAVENOUS | Status: DC

## 2020-03-06 NOTE — Progress Notes (Signed)
SUBJECTIVE:   CHIEF COMPLAINT / HPI:   Lightheadedness, dizziness and shortness of breath: Patient seen in the emergency room on 6/2 for above complaints but reports that they only treated her for her chronic left shoulder pain.  She states that she was given Flexeril and sent home.  She states that her shoulder pain has not been bothering her more than normal.  She reports that her symptoms did not worsen after taking the Flexeril and allowed her to sleep.  Patient reports that about 1 week prior to [facility] Day she started needing to use a cane as she was having trouble walking.  She states that she used to walk multiple blocks per day and this was new for her.  She states that she would feel like the room was spinning and that she felt queasy.  She stated that her left foot and left side felt off.  She denies any weakness, numbness, tingling.  She states that she has not had any trouble hearing, no congestion, no ear pain, no vomiting.  She reports that she is having some nausea with her feelings of queasiness.  She reports that she has had shortness of breath while walking that has been going on for a couple of weeks and she notices that her shortness of breath gets worse when she is feeling queasy.  She denies any chest pain or palpitations.  She states that when she gets up she feels more dizzy.  She reports that she was in Ortho office this morning and they had orthostatic vitals performed on her and told her that they were normal.  She denies any bleeding.  She denies any change in her bowel movements.  PERTINENT  PMH / PSH: Tobacco use disorder, bipolar 2, schizoaffective, unintentional weight loss  OBJECTIVE:  BP 120/60   Pulse 80   Ht 5\' 1"  (1.549 m)   Wt 127 lb (57.6 kg)   LMP  (LMP Unknown)   SpO2 99% Comment: from 98%-96% walking on room air  BMI 24.00 kg/m Unable to ambulate in the hallway with pulse ox due to shortness of breath but pulse ox only dropped to 96%  General:  NAD, pleasant, using golf club as cane in office Neck: Supple, no LAD Cardiovascular: RRR, no m/r/g, no LE edema Respiratory: CTA BL, normal work of breathing Gastrointestinal: soft, nontender, nondistended, normoactive BS Neuro: CN II-XII grossly intact Psych: AOx3, appropriate affect  ASSESSMENT/PLAN:   Dyspnea on exertion Patient unable to complete ambulation with pulse ox due to shortness of breath.  Pulse ox only dropped to 96%, however patient is high risk for ischemia given smoking history.  Some concern for ischemia causing these issues.  EKG here in office unchanged from prior. -Low-dose CT scan for cancer screening also showed signs of atherosclerosis -Referral placed to cardiology for her work-up of dyspnea on exertion -Patient also seen in the emergency room over the weekend for similar symptoms and troponin negative x2.  However EKG did show some signs of ST elevation.  Will call patient today to ensure that her symptoms have improved and advised her of cardiology referral.  Dizziness BMP and CBC obtained in the office WNL.  Orthostatic vitals normal at orthopedic office.  Patient reporting room spinning.  Will place referral to neuro rehabilitation as it seems as this may be related to vertigo, but may be multifactorial with her shortness of breath exacerbating her symptoms. Patient also currently undergoing work-up for unintentional weight loss and will need to  follow closely with PCP.    [place] Immanuel Fedak, DO PGY-3, [facility]

## 2020-03-06 NOTE — ED Triage Notes (Signed)
Pt states she just have a syncope episode 40 min prior to arrival, pt denies any pain a this time. Pt is AO x 4 no neuro deficit noticed on triage.

## 2020-03-06 NOTE — Patient Instructions (Addendum)
Thank you for coming to see me today. It was a pleasure! Today we talked about:   We have done some blood work today to try to figure out what is causing your lightheadedness and dizziness.  We will call you with these results as soon as they're available if anything is abnormal, otherwise expect a letter.  I have placed a referral for your rehabilitation that should call you in order to schedule follow-up to see if your dizziness is due to vertigo.  If your lab work is normal then we may discuss having a stress test in order to be sure that is not your heart causing you to be short of breath.  If you develop worsening shortness of breath and chest pain then do not hesitate to go to the emergency room.  Please follow-up with your PCP if your symptoms continue or sooner as needed.  If you have any questions or concerns, please do not hesitate to call the office at [phone].  Take Care,   [place] Samvel Zinn, DO

## 2020-03-07 ENCOUNTER — Emergency Department (HOSPITAL_COMMUNITY): Payer: Medicaid Other

## 2020-03-07 ENCOUNTER — Other Ambulatory Visit: Payer: Self-pay

## 2020-03-07 LAB — BASIC METABOLIC PANEL
Anion gap: 10 (ref 5–15)
BUN: 11 mg/dL (ref 6–20)
CO2: 22 mmol/L (ref 22–32)
Calcium: 9.1 mg/dL (ref 8.9–10.3)
Chloride: 100 mmol/L (ref 98–111)
Creatinine, Ser: 0.94 mg/dL (ref 0.44–1.00)
GFR calc Af Amer: >60 mL/min (ref >60)
GFR calc non Af Amer: >60 mL/min (ref >60)
Glucose, Bld: 142 mg/dL — ABNORMAL HIGH (ref 70–99)
Potassium: 3.2 mmol/L — ABNORMAL LOW (ref 3.5–5.1)
Sodium: 132 mmol/L — ABNORMAL LOW (ref 135–145)

## 2020-03-07 LAB — URINALYSIS, ROUTINE W REFLEX MICROSCOPIC
Glucose, UA: NEGATIVE mg/dL
Hgb urine dipstick: NEGATIVE
Ketones, ur: NEGATIVE mg/dL
Nitrite: NEGATIVE
Protein, ur: 30 mg/dL — AB
Specific Gravity, Urine: 1.036 — ABNORMAL HIGH (ref 1.005–1.030)
pH: 5 (ref 5.0–8.0)

## 2020-03-07 LAB — COMPREHENSIVE METABOLIC PANEL
ALT: 9 IU/L (ref 0–32)
AST: 13 IU/L (ref 0–40)
Albumin/Globulin Ratio: 2.2 (ref 1.2–2.2)
Albumin: 4.8 g/dL (ref 3.8–4.9)
Alkaline Phosphatase: 63 IU/L (ref 48–121)
BUN/Creatinine Ratio: 10 (ref 9–23)
BUN: 8 mg/dL (ref 6–24)
Bilirubin Total: 0.3 mg/dL (ref 0.0–1.2)
CO2: 24 mmol/L (ref 20–29)
Calcium: 10.2 mg/dL (ref 8.7–10.2)
Chloride: 102 mmol/L (ref 96–106)
Creatinine, Ser: 0.81 mg/dL (ref 0.57–1.00)
GFR calc Af Amer: 93 mL/min/{1.73_m2} (ref >59)
GFR calc non Af Amer: 81 mL/min/{1.73_m2} (ref >59)
Globulin, Total: 2.2 g/dL (ref 1.5–4.5)
Glucose: 82 mg/dL (ref 65–99)
Potassium: 4.2 mmol/L (ref 3.5–5.2)
Sodium: 141 mmol/L (ref 134–144)
Total Protein: 7 g/dL (ref 6.0–8.5)

## 2020-03-07 LAB — CBC
HCT: 36.1 % (ref 36.0–46.0)
Hematocrit: 39.6 % (ref 34.0–46.6)
Hemoglobin: 11.5 g/dL — ABNORMAL LOW (ref 12.0–15.0)
Hemoglobin: 13.2 g/dL (ref 11.1–15.9)
MCH: 29.1 pg (ref 26.0–34.0)
MCH: 29.6 pg (ref 26.6–33.0)
MCHC: 31.9 g/dL (ref 30.0–36.0)
MCHC: 33.3 g/dL (ref 31.5–35.7)
MCV: 89 fL (ref 79–97)
MCV: 91.4 fL (ref 80.0–100.0)
Platelets: 312 10*3/uL (ref 150–400)
Platelets: 347 10*3/uL (ref 150–450)
RBC: 3.95 MIL/uL (ref 3.87–5.11)
RBC: 4.46 x10E6/uL (ref 3.77–5.28)
RDW: 13.7 % (ref 11.7–15.4)
RDW: 13.8 % (ref 11.5–15.5)
WBC: 11.2 10*3/uL — ABNORMAL HIGH (ref 3.4–10.8)
WBC: 14 10*3/uL — ABNORMAL HIGH (ref 4.0–10.5)
nRBC: 0 % (ref 0.0–0.2)

## 2020-03-07 LAB — TSH+FREE T4
Free T4: 1.33 ng/dL (ref 0.82–1.77)
TSH: 0.409 u[IU]/mL — ABNORMAL LOW (ref 0.450–4.500)

## 2020-03-07 LAB — TROPONIN I (HIGH SENSITIVITY)
Troponin I (High Sensitivity): 3 ng/L (ref <18)
Troponin I (High Sensitivity): 2 ng/L (ref <18)

## 2020-03-07 LAB — I-STAT BETA HCG BLOOD, ED (NOT ORDERABLE): I-stat hCG, quantitative: <5 m[IU]/mL (ref <5)

## 2020-03-07 MED ORDER — POTASSIUM CHLORIDE CRYS ER 20 MEQ PO TBCR
40.0000 meq | EXTENDED_RELEASE_TABLET | Freq: Once | ORAL | Status: AC
  Administered 2020-03-07: 40 meq via ORAL
  Filled 2020-03-07: qty 2

## 2020-03-07 MED ORDER — SODIUM CHLORIDE 0.9 % IV BOLUS (SEPSIS)
1000.0000 mL | Freq: Once | INTRAVENOUS | Status: AC
  Administered 2020-03-07: 1000 mL via INTRAVENOUS

## 2020-03-07 NOTE — ED Notes (Signed)
Pt resting comfortably. Respirations even and unlabored.  

## 2020-03-07 NOTE — Discharge Instructions (Signed)
Please make sure you are eating and drinking well and avoid marijuana. See your regular doctor ASAP for follow up and additional testing. Thank you for allowing me to care for you today. Please return to the emergency department if you have new or worsening symptoms. Take your medications as instructed.

## 2020-03-07 NOTE — ED Notes (Signed)
Patient Alert and oriented to baseline. Stable and ambulatory to baseline. Patient verbalized understanding of the discharge instructions.  Patient belongings were taken by the patient.   

## 2020-03-07 NOTE — ED Notes (Signed)
Pt ambulatory to the restroom without difficulty.

## 2020-03-07 NOTE — ED Provider Notes (Signed)
[facility] EMERGENCY DEPARTMENT Provider Note   CSN: [id number] Arrival date & time: 03/06/20  2333     History Chief Complaint  Patient presents with  . Loss of Consciousness    Bridget [name] is a 59 y.o. female.  Patient is a 59 year old female with past medical history of bipolar disorder, depression presenting to the emergency department for syncopal episode.  Patient reports that yesterday she was at home when she smoked a little bit of marijuana like she usually does before eating.  She then had something to eat and got up to use the bathroom.  When she was walking from the couch to the bathroom she began to feel sweaty, lightheaded, hot and weak and asked for cold compress.  She then reports that she slowly collapsed to the ground and lost consciousness for a couple of seconds before being woken up by her daughter yelling her name.  She reports that something similar but not as profound happened about a year ago when she is dehydrated.  She does report over the last year or so she has had poor appetite and decreased oral intake due to stress in life.  She also reports that she has been feeling dizzy over the last several weeks which seem to be worse this past Wednesday.  There are no exacerbating or relieving factors.  She reports occasional chest pain and shortness of breath but none currently.  She is a smoker.  She did see her primary medical doctor yesterday for her dizziness and had labs drawn.        Past Medical History:  Diagnosis Date  . Bipolar II disorder (HCC)   . Community acquired pneumonia of right upper lobe of lung 08/14/2017  . Depression   . Obesity, morbid (HCC)   . Osteoarthritis     Patient Active Problem List   Diagnosis Date Noted  . Adhesive capsulitis of right shoulder 01/02/2020  . High risk social situation 03/26/2019  . Cramp in muscle 12/11/2018  . TSH deficiency 10/12/2018  . Encounter for screening mammogram for  malignant neoplasm of breast 10/09/2018  . Unintentional weight loss 10/09/2018  . Prediabetes 10/08/2018  . Possible exposure to STD 06/13/2018  . Weight loss 06/13/2018  . Lower respiratory infection 12/21/2017  . Foot pain, bilateral 10/27/2017  . Routine screening for STI (sexually transmitted infection) 10/27/2017  . Gait instability 10/27/2017  . Lesion of stomach 09/07/2017  . Nonspecific abnormal findings on imaging of lung 08/21/2017  . Mood disorder (HCC)   . Chronic pain syndrome   . Anxiety state 03/07/2017  . Primary localized osteoarthritis of left hip 02/07/2017  . Fibromyalgia 05/15/2014  . Colonoscopy refused 05/15/2014  . Healthcare maintenance 05/15/2014  . Schizoaffective disorder (HCC) 12/13/2012  . Other and unspecified hyperlipidemia 12/13/2012  . Plantar fasciitis 04/16/2012  . Primary osteoarthritis of right hip 08/15/2011  . Obesity, morbid (HCC)   . Bipolar 2 disorder (HCC) 06/15/2011  . TOBACCO DEPENDENCE 11/30/2006  . OSTEOARTHRITIS OF SPINE, NOS 11/30/2006  . INCONTINENCE, URGE 11/30/2006    Past Surgical History:  Procedure Laterality Date  . BONE SPURS     REMOVED FROM SHOULDERS  . EYE SURGERY     STY REMOVED LEFT EYE  1992  . ROTATOR CUFF REPAIR     BILATERAL  . TOTAL HIP ARTHROPLASTY  08/15/2011   Procedure: TOTAL HIP ARTHROPLASTY;  Surgeon: Eulas Post;  Location: [facility] OR;  Service: Orthopedics;  Laterality: Right;  .  TOTAL HIP ARTHROPLASTY Left 02/07/2017  . TOTAL HIP ARTHROPLASTY Left 02/07/2017   Procedure: LEFT TOTAL HIP ARTHROPLASTY;  Surgeon: Teryl Lucy, MD;  Location: [facility] OR;  Service: Orthopedics;  Laterality: Left;     OB History   No obstetric history on file.     Family History  Problem Relation Age of Onset  . Bipolar disorder Sister   . Hypertension Sister   . Hypertension Brother   . Diabetes Brother   . Heart disease Brother   . Hypertension Other   . Hypertension Mother   . Rheum arthritis Mother   .  Heart disease Mother   . Hypertension Maternal Aunt   . Cancer Father   . Asthma Daughter   . Asthma Son     Social History   Tobacco Use  . Smoking status: Current Every Day Smoker    Packs/day: 0.50    Years: 20.00    Pack years: 10.00    Types: Cigarettes  . Smokeless tobacco: Former Neurosurgeon    Quit date: 02/06/2017  Substance Use Topics  . Alcohol use: No  . Drug use: Yes    Types: Marijuana    Home Medications Prior to Admission medications   Medication Sig Start Date End Date Taking? Authorizing Provider  cyclobenzaprine (FLEXERIL) 5 MG tablet Take 1 tablet (5 mg total) by mouth 2 (two) times daily as needed for up to 10 days for muscle spasms. 03/04/20 03/14/20  Carroll Sage, PA-C  fluticasone (FLONASE) 50 MCG/ACT nasal spray Place 2 sprays into both nostrils daily. 12/22/17   Marthenia Rolling, DO  methocarbamol (ROBAXIN) 500 MG tablet Take 1 tablet (500 mg total) by mouth every 8 (eight) hours as needed for muscle spasms. 01/19/20   Elvina Sidle, MD  naproxen (NAPROSYN) 500 MG tablet Take 1 tablet (500 mg total) by mouth 2 (two) times daily with a meal. 01/19/20   Elvina Sidle, MD  Nutritional Supplements (ENSURE COMPLETE SHAKE) LIQD Take 1 Bottle by mouth daily as needed. 10/08/18   Marthenia Rolling, DO    Allergies    Lyrica [pregabalin], Neurontin [gabapentin], and Tramadol  Review of Systems   Review of Systems  Constitutional: Positive for appetite change, diaphoresis and fatigue. Negative for chills and fever.  HENT: Negative for congestion.   Eyes: Negative for visual disturbance.  Respiratory: Positive for shortness of breath. Negative for cough.   Cardiovascular: Positive for chest pain.  Gastrointestinal: Negative for abdominal pain, nausea and vomiting.  Genitourinary: Negative for dysuria.  Musculoskeletal: Negative for back pain.  Skin: Negative for rash.  Neurological: Positive for dizziness, syncope and light-headedness.  Psychiatric/Behavioral:  Negative for confusion.    Physical Exam Updated Vital Signs BP 123/82   Pulse 61   Temp 97.9 F (36.6 C) (Oral)   Resp 16   Ht 5\' 1"  (1.549 m)   Wt 57.2 kg   LMP  (LMP Unknown)   SpO2 100%   BMI 23.83 kg/m   Physical Exam Vitals and nursing note reviewed.  Constitutional:      General: She is not in acute distress.    Appearance: Normal appearance. She is not ill-appearing, toxic-appearing or diaphoretic.  HENT:     Head: Normocephalic.     Nose: Nose normal.     Mouth/Throat:     Mouth: Mucous membranes are moist.  Eyes:     Conjunctiva/sclera: Conjunctivae normal.  Cardiovascular:     Rate and Rhythm: Normal rate and regular rhythm.  Pulmonary:  Effort: Pulmonary effort is normal.     Breath sounds: Normal breath sounds.  Abdominal:     General: Abdomen is flat.     Palpations: Abdomen is soft.  Skin:    General: Skin is warm and dry.  Neurological:     General: No focal deficit present.     Mental Status: She is alert and oriented to person, place, and time.     Cranial Nerves: No cranial nerve deficit.     Sensory: No sensory deficit.     Motor: No weakness.     Coordination: Coordination normal.     Gait: Gait normal.  Psychiatric:        Mood and Affect: Mood normal.     ED Results / Procedures / Treatments   Labs (all labs ordered are listed, but only abnormal results are displayed) Labs Reviewed  BASIC METABOLIC PANEL - Abnormal; Notable for the following components:      Result Value   Sodium 132 (*)    Potassium 3.2 (*)    Glucose, Bld 142 (*)    All other components within normal limits  CBC - Abnormal; Notable for the following components:   WBC 14.0 (*)    Hemoglobin 11.5 (*)    All other components within normal limits  URINALYSIS, ROUTINE W REFLEX MICROSCOPIC - Abnormal; Notable for the following components:   APPearance HAZY (*)    Specific Gravity, Urine 1.036 (*)    Bilirubin Urine MODERATE (*)    Protein, ur 30 (*)     Leukocytes,Ua SMALL (*)    Bacteria, UA RARE (*)    All other components within normal limits  URINE CULTURE  CBG MONITORING, ED  I-STAT BETA HCG BLOOD, ED ([facility], [facility], [facility] ONLY)  TROPONIN I (HIGH SENSITIVITY)  TROPONIN I (HIGH SENSITIVITY)    EKG EKG Interpretation  Date/Time:  Saturday March 07 2020 00:11:22 EDT Ventricular Rate:  75 PR Interval:  130 QRS Duration: 76 QT Interval:  398 QTC Calculation: 444 R Axis:   69 Text Interpretation: Normal sinus rhythm Right atrial enlargement Borderline ECG previous ecg showed ST elevation however was likely arm lead reversal as qrs in avr is up. this one has no acute changes  compared to multiple previous Confirmed by Marily Memos [id number]) on 03/07/2020 12:26:54 AM   Radiology DG Chest 2 View  Result Date: 03/07/2020 CLINICAL DATA:  Patient with syncopal episode. EXAM: CHEST - 2 VIEW COMPARISON:  Chest radiograph 10/27/2017. FINDINGS: Monitoring leads overlie the patient. Stable cardiac and mediastinal contours. No consolidative pulmonary opacities. No pleural effusion or pneumothorax. Thoracic spine degenerative changes. IMPRESSION: No active cardiopulmonary disease. Electronically Signed   By: Annia Belt M.D.   On: 03/07/2020 09:49   CT Head Wo Contrast  Result Date: 03/07/2020 CLINICAL DATA:  Dizziness and syncopal episode. EXAM: CT HEAD WITHOUT CONTRAST TECHNIQUE: Contiguous axial images were obtained from the base of the skull through the vertex without intravenous contrast. COMPARISON:  None. FINDINGS: Brain: Ventricles and sulci are appropriate for patient's age. No evidence for acute cortically based infarct, intracranial hemorrhage, mass lesion or mass-effect. Vascular: Unremarkable Skull: Intact. Sinuses/Orbits: Paranasal sinuses are well aerated. Mastoid air cells are unremarkable. Orbits are unremarkable. Other: None. IMPRESSION: No acute intracranial process. Electronically Signed   By: Annia Belt M.D.   On: 03/07/2020 09:46     Procedures Procedures (including critical care time)  Medications Ordered in ED Medications  sodium chloride flush (NS) 0.9 % injection 3 mL (  has no administration in time range)  potassium chloride SA (KLOR-CON) CR tablet 40 mEq (has no administration in time range)  sodium chloride 0.9 % bolus 1,000 mL (0 mLs Intravenous Stopped 03/07/20 1225)    ED Course  I have reviewed the triage vital signs and the nursing notes.  Pertinent labs & imaging results that were available during my care of the patient were reviewed by me and considered in my medical decision making (see chart for details).  Clinical Course as of Mar 07 1257  Sat Mar 07, 6464  5558 59 year old female presenting to the emergency department for dizziness and syncope.  On my exam she is well-appearing with normal vital signs.   [KM]  1249 Workup reveals a mildly elevated WBC count to 14 with concentrated urine with rare bacteria. Chest xray and head CT are normal. She has a mild hypokalemia and hyponatremia but otherwise unremarkable workup. Trop negative x2. She is feeling better with fluids and vitals are normal. I suspect possible vasovagal or orthostatic syncope given she has not been eating and drinking very well, on top of smoking THC and being dehydrated. No clear sign of infection and no concern for cardiopulmonary emergency at this time. Advised patient she will need to continue to f/u with her PMD for further workup. She was advised on return precautions.    [KM]    Clinical Course User Index [KM] Kristine Royal   MDM Rules/Calculators/A&P                      Based on review of vitals, medical screening exam, lab work and/or imaging, there does not appear to be an acute, emergent etiology for the patient's symptoms. Counseled pt on good return precautions and encouraged both PCP and ED follow-up as needed.  Prior to discharge, I also discussed incidental imaging findings with patient in detail and  advised appropriate, recommended follow-up in detail.  Clinical Impression: 1. Syncope and collapse   2. Hypokalemia     Disposition: Discharge  Prior to providing a prescription for a controlled substance, I independently reviewed the patient's recent prescription history on the [facility]. The patient had no recent or regular prescriptions and was deemed appropriate for a brief, less than 3 day prescription of narcotic for acute analgesia.  This note was prepared with assistance of Systems analyst. Occasional wrong-word or sound-a-like substitutions may have occurred due to the inherent limitations of voice recognition software.  Final Clinical Impression(s) / ED Diagnoses Final diagnoses:  Syncope and collapse  Hypokalemia    Rx / DC Orders ED Discharge Orders    None       Kristine Royal 03/07/20 1259    Davonna Belling, MD 03/07/20 [phone]

## 2020-03-08 LAB — URINE CULTURE: Culture: <10000 — AB

## 2020-03-09 ENCOUNTER — Encounter: Payer: Self-pay | Admitting: Family Medicine

## 2020-03-09 NOTE — Progress Notes (Signed)
Letter sent Negative for any sign of lung cancer Underlying known CAD

## 2020-03-10 ENCOUNTER — Ambulatory Visit (HOSPITAL_COMMUNITY): Payer: Medicaid Other | Admitting: Psychiatry

## 2020-03-10 ENCOUNTER — Other Ambulatory Visit: Payer: Self-pay

## 2020-03-10 ENCOUNTER — Encounter (HOSPITAL_COMMUNITY): Payer: Self-pay | Admitting: Psychiatry

## 2020-03-10 DIAGNOSIS — F4310 Post-traumatic stress disorder, unspecified: Secondary | ICD-10-CM

## 2020-03-10 DIAGNOSIS — F331 Major depressive disorder, recurrent, moderate: Secondary | ICD-10-CM

## 2020-03-10 NOTE — Progress Notes (Signed)
Office Visit   I met with Bridget [name] on 29/19/16 at  1:30 PM EDT in person, in the office.   History of Present Illness: PTSD   Treatment Plan Goals: 1) Bridget [name] would like to process grief and loss experiences to decrease depressive symptoms. 2) Bridget [name] would like to process and heal from trauma experiences to decrease instances of trauma triggers/reactions with impact her daily functioning and fulfillment in relationships.   Observations/Objective: Counselor met with client for individual therapy in person in the office setting. Counselor assessed MH symptoms and progress on treatment plan goals, with patient reporting on updates in recent months while seeing another therapist. Client presents with moderate depression and moderate anxiety, pacing the floor and jumping from topic to topic. Client denied suicidal ideation or self-harm behaviors.   Client shared that meeting with last provider was helpful in having a safe space to process emotions. Client would like to meet with provider more regularly. Counselor and client reviewed and updated Comprehensive Clinical Assessment and created goals (see above), based on current needs and presentation. Counselor reviewed community resources and accessibility for client. Client and provider to meet once per week to address above goals.  Assessment and Plan: Counselor will continue to meet with patient to address treatment plan goals. Patient will continue to follow recommendations of providers and implement skills learned in session.  Follow Up Instructions: Counselor will send information for next session via Webex.    The patient was advised to call back or seek an in-person evaluation if the symptoms worsen or if the condition fails to improve as anticipated.  I provided 55 minutes of non-face-to-face time during this encounter.   [name], LCSW

## 2020-03-11 ENCOUNTER — Telehealth: Payer: Self-pay | Admitting: Family Medicine

## 2020-03-11 DIAGNOSIS — R0609 Other forms of dyspnea: Secondary | ICD-10-CM

## 2020-03-11 DIAGNOSIS — R42 Dizziness and giddiness: Secondary | ICD-10-CM

## 2020-03-11 HISTORY — DX: Other forms of dyspnea: R06.09

## 2020-03-11 HISTORY — DX: Dizziness and giddiness: R42

## 2020-03-11 NOTE — Assessment & Plan Note (Addendum)
Patient unable to complete ambulation with pulse ox due to shortness of breath.  Pulse ox only dropped to 96%, however patient is high risk for ischemia given smoking history.  Some concern for ischemia causing these issues.  EKG here in office unchanged from prior. -Low-dose CT scan for cancer screening also showed signs of atherosclerosis -Referral placed to cardiology for her work-up of dyspnea on exertion -Patient also seen in the emergency room over the weekend for similar symptoms and troponin negative x2.  However EKG did show some signs of ST elevation.  Will call patient today to ensure that her symptoms have improved and advised her of cardiology referral.

## 2020-03-11 NOTE — Assessment & Plan Note (Addendum)
BMP and CBC obtained in the office WNL.  Orthostatic vitals normal at orthopedic office.  Patient reporting room spinning.  Will place referral to neuro rehabilitation as it seems as this may be related to vertigo, but may be multifactorial with her shortness of breath exacerbating her symptoms. Patient also currently undergoing work-up for unintentional weight loss and will need to follow closely with PCP.

## 2020-03-11 NOTE — Telephone Encounter (Signed)
Called and informed patient of recent low-dose CT scanning which shows no signs of lung cancer.  Does show signs of atherosclerosis and possible COPD however at last office visit lungs were clear to auscultation bilaterally.  Discussed with patient and she is still having some shortness of breath with exertion.  She reports that she is not having any chest pain but does sometimes feel chest heaviness.  She has follow-up with Dr. Constance Goltz on 03/12/2020 and was given strict return precautions to the ED.  Patient states that overall she is feeling better but she is still experiencing dizziness and lightheadedness.  Place urgent referral to cardiology given her continued dyspnea on exertion and high risk for cardiovascular disease.  [place] Elesia Pemberton, DO PGY-3, [facility]

## 2020-03-12 ENCOUNTER — Ambulatory Visit (INDEPENDENT_AMBULATORY_CARE_PROVIDER_SITE_OTHER): Payer: Medicaid Other | Admitting: Family Medicine

## 2020-03-12 ENCOUNTER — Encounter: Payer: Self-pay | Admitting: Family Medicine

## 2020-03-12 ENCOUNTER — Other Ambulatory Visit: Payer: Self-pay

## 2020-03-12 ENCOUNTER — Emergency Department (HOSPITAL_COMMUNITY): Payer: Medicaid Other

## 2020-03-12 ENCOUNTER — Encounter (HOSPITAL_COMMUNITY): Payer: Self-pay | Admitting: Emergency Medicine

## 2020-03-12 ENCOUNTER — Emergency Department (HOSPITAL_COMMUNITY)
Admission: EM | Admit: 2020-03-12 | Discharge: 2020-03-13 | Disposition: A | Discharge status: Home / Self Care | Payer: Medicaid Other | Attending: Emergency Medicine | Admitting: Emergency Medicine

## 2020-03-12 VITALS — BP 100/58 | HR 90 | Ht 61.0 in | Wt 126.5 lb

## 2020-03-12 DIAGNOSIS — Z79899 Other long term (current) drug therapy: Secondary | ICD-10-CM | POA: Insufficient documentation

## 2020-03-12 DIAGNOSIS — R079 Chest pain, unspecified: Secondary | ICD-10-CM | POA: Diagnosis not present

## 2020-03-12 DIAGNOSIS — E876 Hypokalemia: Secondary | ICD-10-CM | POA: Diagnosis not present

## 2020-03-12 DIAGNOSIS — R55 Syncope and collapse: Secondary | ICD-10-CM | POA: Diagnosis present

## 2020-03-12 DIAGNOSIS — F17210 Nicotine dependence, cigarettes, uncomplicated: Secondary | ICD-10-CM | POA: Insufficient documentation

## 2020-03-12 DIAGNOSIS — R7303 Prediabetes: Secondary | ICD-10-CM | POA: Diagnosis not present

## 2020-03-12 DIAGNOSIS — Z96642 Presence of left artificial hip joint: Secondary | ICD-10-CM | POA: Diagnosis not present

## 2020-03-12 LAB — BASIC METABOLIC PANEL
Anion gap: 13 (ref 5–15)
BUN: 10 mg/dL (ref 6–20)
CO2: 20 mmol/L — ABNORMAL LOW (ref 22–32)
Calcium: 9.6 mg/dL (ref 8.9–10.3)
Chloride: 107 mmol/L (ref 98–111)
Creatinine, Ser: 0.69 mg/dL (ref 0.44–1.00)
GFR calc Af Amer: >60 mL/min (ref >60)
GFR calc non Af Amer: >60 mL/min (ref >60)
Glucose, Bld: 90 mg/dL (ref 70–99)
Potassium: 3.2 mmol/L — ABNORMAL LOW (ref 3.5–5.1)
Sodium: 140 mmol/L (ref 135–145)

## 2020-03-12 LAB — CBG MONITORING, ED
Glucose-Capillary: 82 mg/dL (ref 70–99)
Glucose-Capillary: 87 mg/dL (ref 70–99)

## 2020-03-12 LAB — CBC
HCT: 35.9 % — ABNORMAL LOW (ref 36.0–46.0)
Hemoglobin: 11.5 g/dL — ABNORMAL LOW (ref 12.0–15.0)
MCH: 29 pg (ref 26.0–34.0)
MCHC: 32 g/dL (ref 30.0–36.0)
MCV: 90.4 fL (ref 80.0–100.0)
Platelets: 273 10*3/uL (ref 150–400)
RBC: 3.97 MIL/uL (ref 3.87–5.11)
RDW: 13.6 % (ref 11.5–15.5)
WBC: 10.3 10*3/uL (ref 4.0–10.5)
nRBC: 0 % (ref 0.0–0.2)

## 2020-03-12 LAB — POCT GLYCOSYLATED HEMOGLOBIN (HGB A1C): Hemoglobin A1C: 5.5 % (ref 4.0–5.6)

## 2020-03-12 LAB — I-STAT BETA HCG BLOOD, ED (MC, WL, AP ONLY): I-stat hCG, quantitative: <5 m[IU]/mL (ref <5)

## 2020-03-12 LAB — TROPONIN I (HIGH SENSITIVITY): Troponin I (High Sensitivity): 3 ng/L (ref <18)

## 2020-03-12 MED ORDER — SODIUM CHLORIDE 0.9% FLUSH: 3.0000 mL | Freq: Once | INTRAVENOUS | Status: DC

## 2020-03-12 NOTE — ED Triage Notes (Signed)
Pt reports feeling near syncopal today with intermittent mid sternal chest heaviness. +nausea, +shob. Pt states she has been seen for this several times and needs to see cards.

## 2020-03-12 NOTE — Patient Instructions (Signed)
Based on your symptoms I believe you had neurogenic syncope.  This is when your autonomic nervous system is not working properly.  We need to rule out a cardiogenic cause of the syncope as well, therefore I have ordered an echocardiogram for you to do.  The cardiologist will talk to you more about the results of this.  Please keep your appointment with the cardiologist.  Please call the neurologic/vestibular physical therapist at the number listed below.  I will also call them to see if they have received our referral.   [facility]) [phone].

## 2020-03-12 NOTE — Progress Notes (Signed)
    SUBJECTIVE:   CHIEF COMPLAINT / HPI:   DOE: cardiology referral? COPD?.  'heavy' hart beat.  Hasn't heard from neuro.    Dizziness: The patient was seen in our office on 6/4 for dyspnea and dizziness.  Later that night she had a syncopal episode and went to the ED.  During that syncopal episode she had "pressure in her head", urinary incontinence, decreased hearing, blurred vision, diaphoresis with feeling of being hot.  No vomiting.  Since being discharged from the ED she has not had any more episodes like this, but she has been continue to have vertigo-like symptoms, but these are improving.  She has not heard back from anybody regarding the referral to the vestibular physical therapy.  She has an appointment with the cardiologist later this month.  Her dyspnea on exertion has improved.  She sometimes feels like she has "heavy heartbeats".   PERTINENT  PMH / PSH: Syncope  OBJECTIVE:   BP (!) 100/58 Comment: Provider informed  Pulse 90   Ht 5\' 1"  (1.549 m)   Wt 126 lb 8 oz (57.4 kg)   LMP  (LMP Unknown)   SpO2 99%   BMI 23.90 kg/m   General: Patient with daughter.  No acute distress. CV: Regular rate and rhythm.  No murmurs. Pulmonary: Lungs clear to auscultation bilaterally, no wheezes or crackles. Neuro: Cranial nerves II through XII grossly intact.  Negative impulse test. Gait: Observed patient walking down the hallway.  Gait is slightly wide-based, but patient moves quickly and linearly.  No signs of ataxia.  When patient stands from a sitting position she rises slowly and initially has some shuffling of feet to keep her balance.   ASSESSMENT/PLAN:   Vasovagal syncope Based on the patient's description of diaphoresis, blurred vision, feeling hot, urinary incontinence, decreased hearing in the moments immediately surrounding the syncopal episode I think the patient most likely has a neurogenic cause of her syncope.  Cannot rule out cardiogenic, therefore will get  echocardiogram that the cardiologist can follow-up on on her visit later this month.  Reviewed EKG from the ED, which did not show anything concerning, but patient may need 24-hour monitor or longer.  Patient was also admittedly dehydrated that day so could be multifactorial with orthostatic and neurogenic syncope.  Unsure if her continued vertigo symptoms are a result of her neurogenic syncope or if a separate issue. -Follow-up echo and cardiology recommendations -Gave patient information to call vestibular physical therapy as they have not called her yet -If vertigo continues to be a problem may need neurology referral.     , MD [facility]

## 2020-03-13 ENCOUNTER — Other Ambulatory Visit: Payer: Self-pay

## 2020-03-13 LAB — D-DIMER, QUANTITATIVE: D-Dimer, Quant: <0.27 ug/mL-FEU (ref 0.00–0.50)

## 2020-03-13 LAB — URINALYSIS, ROUTINE W REFLEX MICROSCOPIC
Bilirubin Urine: NEGATIVE
Glucose, UA: NEGATIVE mg/dL
Hgb urine dipstick: NEGATIVE
Ketones, ur: NEGATIVE mg/dL
Leukocytes,Ua: NEGATIVE
Nitrite: NEGATIVE
Protein, ur: NEGATIVE mg/dL
Specific Gravity, Urine: 1.005 (ref 1.005–1.030)
pH: 6 (ref 5.0–8.0)

## 2020-03-13 LAB — TROPONIN I (HIGH SENSITIVITY): Troponin I (High Sensitivity): 6 ng/L (ref <18)

## 2020-03-13 MED ORDER — POTASSIUM CHLORIDE CRYS ER 20 MEQ PO TBCR
40.0000 meq | EXTENDED_RELEASE_TABLET | Freq: Once | ORAL | Status: AC
  Administered 2020-03-13: 40 meq via ORAL
  Filled 2020-03-13: qty 2

## 2020-03-13 NOTE — ED Provider Notes (Signed)
[facility]   CSN: [id number] Arrival date & time: 03/12/20  2055     History Chief Complaint  Patient presents with  . Chest Pain    Bridget [name] is a 59 y.o. female with PMHx bipolar disorder, depression, fibromyalgia who presents to the ED today with complaint of near syncopal episode with associated chest pressure and SOB that occurred last night.  Recently seen in the ED on 06/05 for same.  Does appear she had a full syncopal episode at that point. ED workup included: EKG, CT Head, CXR, CBC, BMP, U/A, troponin x 2. Workup revealed mildly elevated WBC count 14, and concentrated urine with rare bacteria. CXR and CT Head normal. IVF fluids provided with concern for possible vasovagal vs orthostatic syncope. Pt reports she felt improved after fluids and went home and spent time with her family barbecuing. Pt felt fine until yesterday afternoon when she walked from her daughter's house to the bus stop. She reports when she got to the bus stop she began feeling lightheaded and like she was off balance. She then began having a chest pressure as well as SOB. Pt had to sit down to catch her breath and reports after about 2-3 minutes all of her symptoms subsided. She does mention she has seen her PCP for same and has an appointment with cardiology next month with plan for Echo soon prior to seeing cards.   The history is provided by the patient and medical records.       Past Medical History:  Diagnosis Date  . Bipolar II disorder (HCC)   . Community acquired pneumonia of right upper lobe of lung 08/14/2017  . Depression   . Obesity, morbid (HCC)   . Osteoarthritis     Patient Active Problem List   Diagnosis Date Noted  . Dyspnea on exertion 03/11/2020  . Dizziness 03/11/2020  . Adhesive capsulitis of right shoulder 01/02/2020  . High risk social situation 03/26/2019  . Cramp in muscle 12/11/2018  . TSH deficiency 10/12/2018  .  Encounter for screening mammogram for malignant neoplasm of breast 10/09/2018  . Unintentional weight loss 10/09/2018  . Prediabetes 10/08/2018  . Possible exposure to STD 06/13/2018  . Weight loss 06/13/2018  . Lower respiratory infection 12/21/2017  . Foot pain, bilateral 10/27/2017  . Routine screening for STI (sexually transmitted infection) 10/27/2017  . Gait instability 10/27/2017  . Lesion of stomach 09/07/2017  . Nonspecific abnormal findings on imaging of lung 08/21/2017  . Mood disorder (HCC)   . Chronic pain syndrome   . Anxiety state 03/07/2017  . Primary localized osteoarthritis of left hip 02/07/2017  . Fibromyalgia 05/15/2014  . Colonoscopy refused 05/15/2014  . Healthcare maintenance 05/15/2014  . Schizoaffective disorder (HCC) 12/13/2012  . Other and unspecified hyperlipidemia 12/13/2012  . Plantar fasciitis 04/16/2012  . Primary osteoarthritis of right hip 08/15/2011  . Obesity, morbid (HCC)   . Bipolar 2 disorder (HCC) 06/15/2011  . TOBACCO DEPENDENCE 11/30/2006  . OSTEOARTHRITIS OF SPINE, NOS 11/30/2006  . INCONTINENCE, URGE 11/30/2006    Past Surgical History:  Procedure Laterality Date  . BONE SPURS     REMOVED FROM SHOULDERS  . EYE SURGERY     STY REMOVED LEFT EYE  1992  . ROTATOR CUFF REPAIR     BILATERAL  . TOTAL HIP ARTHROPLASTY  08/15/2011   Procedure: TOTAL HIP ARTHROPLASTY;  Surgeon: Eulas PostJoshua P Landau;  Location: [facility] OR;  Service: Orthopedics;  Laterality: Right;  .  TOTAL HIP ARTHROPLASTY Left 02/07/2017  . TOTAL HIP ARTHROPLASTY Left 02/07/2017   Procedure: LEFT TOTAL HIP ARTHROPLASTY;  Surgeon: Teryl Lucy, MD;  Location: [facility] OR;  Service: Orthopedics;  Laterality: Left;     OB History   No obstetric history on file.     Family History  Problem Relation Age of Onset  . Bipolar disorder Sister   . Hypertension Sister   . Hypertension Brother   . Diabetes Brother   . Heart disease Brother   . Hypertension Other   . Hypertension Mother    . Rheum arthritis Mother   . Heart disease Mother   . Hypertension Maternal Aunt   . Cancer Father   . Asthma Daughter   . Asthma Son     Social History   Tobacco Use  . Smoking status: Current Every Day Smoker    Packs/day: 0.50    Years: 20.00    Pack years: 10.00    Types: Cigarettes  . Smokeless tobacco: Former Neurosurgeon    Quit date: 02/06/2017  Vaping Use  . Vaping Use: Never used  Substance Use Topics  . Alcohol use: No  . Drug use: Yes    Types: Marijuana    Home Medications Prior to Admission medications   Medication Sig Start Date End Date Taking? Authorizing Provider  acetaminophen (TYLENOL) 650 MG CR tablet Take 1,300 mg by mouth every 8 (eight) hours as needed for pain.   Yes [provider]  BLACK CURRANT SEED OIL PO Take 1 capsule by mouth daily.   Yes [provider]  ergocalciferol (DRISDOL) 200 MCG/ML drops Take 4,000 Units by mouth daily.   Yes [provider]  methocarbamol (ROBAXIN) 500 MG tablet Take 1 tablet (500 mg total) by mouth every 8 (eight) hours as needed for muscle spasms. 01/19/20  Yes Elvina Sidle, MD  Multiple Vitamin (MULTIVITAMIN) capsule Take 1 capsule by mouth daily.   Yes [provider]  naproxen (NAPROSYN) 500 MG tablet Take 1 tablet (500 mg total) by mouth 2 (two) times daily with a meal. 01/19/20  Yes Elvina Sidle, MD  cyclobenzaprine (FLEXERIL) 5 MG tablet Take 1 tablet (5 mg total) by mouth 2 (two) times daily as needed for up to 10 days for muscle spasms. 03/04/20 03/14/20  Carroll Sage, PA-C  fluticasone (FLONASE) 50 MCG/ACT nasal spray Place 2 sprays into both nostrils daily. Patient not taking: Reported on 03/13/2020 12/22/17   Marthenia Rolling, DO  Nutritional Supplements (ENSURE COMPLETE SHAKE) LIQD Take 1 Bottle by mouth daily as needed. Patient not taking: Reported on 03/13/2020 10/08/18   Marthenia Rolling, DO    Allergies    Lyrica [pregabalin], Cymbalta [duloxetine hcl], Neurontin  [gabapentin], and Tramadol  Review of Systems   Review of Systems  Constitutional: Negative for chills, diaphoresis and fever.  Respiratory: Positive for shortness of breath. Negative for cough.   Cardiovascular: Positive for chest pain.  Gastrointestinal: Negative for abdominal pain, nausea and vomiting.  Neurological: Positive for light-headedness. Negative for headaches.       + near syncope  All other systems reviewed and are negative.   Physical Exam Updated Vital Signs BP 102/82   Pulse (!) 59   Temp 98.3 F (36.8 C) (Oral)   Resp 16   LMP  (LMP Unknown)   SpO2 100%   Physical Exam Vitals and nursing note reviewed.  Constitutional:      Appearance: She is not ill-appearing or diaphoretic.  HENT:  Head: Normocephalic and atraumatic.  Eyes:     Conjunctiva/sclera: Conjunctivae normal.  Cardiovascular:     Rate and Rhythm: Normal rate and regular rhythm.     Pulses:          Radial pulses are 2+ on the right side and 2+ on the left side.       Dorsalis pedis pulses are 2+ on the right side and 2+ on the left side.  Pulmonary:     Effort: Pulmonary effort is normal.     Breath sounds: Normal breath sounds. No decreased breath sounds, wheezing, rhonchi or rales.  Abdominal:     Palpations: Abdomen is soft.     Tenderness: There is no abdominal tenderness. There is no guarding or rebound.  Musculoskeletal:     Cervical back: Neck supple.  Skin:    General: Skin is warm and dry.  Neurological:     Mental Status: She is alert.     Comments: CN 3-12 grossly intact A&O x4 GCS 15 Sensation and strength intact Coordination with finger-to-nose WNL Neg romberg, neg pronator drift     ED Results / Procedures / Treatments   Labs (all labs ordered are listed, but only abnormal results are displayed) Labs Reviewed  BASIC METABOLIC PANEL - Abnormal; Notable for the following components:      Result Value   Potassium 3.2 (*)    CO2 20 (*)    All other components  within normal limits  CBC - Abnormal; Notable for the following components:   Hemoglobin 11.5 (*)    HCT 35.9 (*)    All other components within normal limits  D-DIMER, QUANTITATIVE (NOT AT [facility])  URINALYSIS, ROUTINE W REFLEX MICROSCOPIC  I-STAT BETA HCG BLOOD, ED ([facility], [facility], [facility] ONLY)  CBG MONITORING, ED  CBG MONITORING, ED  TROPONIN I (HIGH SENSITIVITY)  TROPONIN I (HIGH SENSITIVITY)    EKG None  Radiology DG Chest 2 View  Result Date: 03/12/2020 CLINICAL DATA:  Near syncope. EXAM: CHEST - 2 VIEW COMPARISON:  March 07, 2020 FINDINGS: The heart size and mediastinal contours are within normal limits. Both lungs are clear. A single radiopaque surgical pin is seen within the right humeral head. The visualized skeletal structures are unremarkable. IMPRESSION: No active cardiopulmonary disease. Electronically Signed   By: Aram Candela M.D.   On: 03/12/2020 21:45    Procedures Procedures (including critical care time)  Medications Ordered in ED Medications  sodium chloride flush (NS) 0.9 % injection 3 mL (3 mLs Intravenous Not Given 03/13/20 0716)  potassium chloride SA (KLOR-CON) CR tablet 40 mEq (has no administration in time range)    ED Course  I have reviewed the triage vital signs and the nursing notes.  Pertinent labs & imaging results that were available during my care of the patient were reviewed by me and considered in my medical decision making (see chart for details).  Clinical Course as of Mar 13 936  Fri Mar 13, 2020  3220 D-Dimer, Quant: <0.27 [MV]    Clinical Course User Index [MV] Tanda Rockers, PA-C   MDM Rules/Calculators/A&P                          59 year old female who presents to the ED today complaining of near syncopal episode with associated chest pain shortness of breath.  She has been having intermittent issues with this for the past several weeks, seen in the ED recently for same with a  full negative work-up including CT head, chest x-ray,  negative troponins x2.  She is currently being seen by PCP for same and has appointment scheduled with cardiology next month with plan for echo next week.  On arrival to the ED patient is afebrile, nontachycardic and nontachypneic.  She has no complaints currently.  She has no focal neuro deficits on exam.  She had a negative CT scan last week and I do not feel she needs additional head imaging.  She has no active chest pain or shortness of breath.  It does not appear she had a full syncopal episode this time however reports near syncope.   Lab work was obtained prior to patient being seen. EKG without ischemic changes. X-ray negative. CBC without leukocytosis.  Globin stable at 11.5, unchanged from previous 6 days ago..  Hemoglobin  Date Value Ref Range Status  03/12/2020 11.5 (L) 12.0 - 15.0 g/dL Final  63/87/5643 32.9 (L) 12.0 - 15.0 g/dL Final  51/88/4166 06.3 11.1 - 15.9 g/dL Final  01/60/1093 23.5 11 - 14.6 g/dL Final  57/32/2025 42.7 11.1 - 15.9 g/dL Final  03/26/7627 31.5 11.1 - 15.9 g/dL Final  17/61/6073 71.0 (L) 12.0 - 15.0 g/dL Final  62/69/4854 62.7 (L) 12.0 - 15.0 g/dL Final   BMP with a potassium of 3.2, will replete in the ED today.  Bicarb of 20.  No gap.  Initial troponin of 3 and repeat troponin of 6.   Will check orthostatic vital signs at this time as well as add on D-dimer.  Question if patient could be having PE causing her syncopal episodes with associated chest pain.  She is otherwise low risk for Wells besides age, if D-dimer negative do not feel she needs a CTA.  UA without any signs of infection.  Specific gravity within normal limits. Orthostatics negative, do not feel patient needs IV fluids at this time.  D-dimer then 0.27, patient without tachycardia and is satting 100% on room air.  I have very low suspicion for PE at this time.  She is currently being worked up PCP with plans for cardiology soon and echo next week.  I feel this is an appropriate next step.   I do not feel patient needs to be admitted at this time.  Return precautions have been discussed with patient including full syncope, worsening chest pain, worsening shortness of breath, weakness/numbness unilaterally, severe headache, vision changes, diaphoresis, nausea, vomiting, any other new/concerning symptoms.  Patient is in agreement with plan and stable for discharge home.   This note was prepared using Dragon voice recognition software and may include unintentional dictation errors due to the inherent limitations of voice recognition software.  Final Clinical Impression(s) / ED Diagnoses Final diagnoses:  Near syncope  Nonspecific chest pain  Hypokalemia    Rx / DC Orders ED Discharge Orders    None       Discharge Instructions     Your labwork and imaging were reassuring in the ED today besides a mild decrease in potassium - we have repleted it here in the ED today. Please follow up with your PCP regarding your ED visit and have your potassium level rechecked next week.   Drink plenty of fluids to stay hydrated. Keep appointment for your echo next week and cardiology next month.   Return to the ED for any worsening symptoms including continuation of passing out, worsening chest pain/shortness of breath, one sided weakness/numbness, speech changes, facial droop, severe headache, vision changes, excessive sweating  associated with chest pain, nausea or vomiting associated with chest pain, or any other new/concerning symptoms.        Eustaquio Maize, PA-C 03/13/20 [place], [place], DO 03/13/20 1215

## 2020-03-13 NOTE — Discharge Instructions (Signed)
Your labwork and imaging were reassuring in the ED today besides a mild decrease in potassium - we have repleted it here in the ED today. Please follow up with your PCP regarding your ED visit and have your potassium level rechecked next week.   Drink plenty of fluids to stay hydrated. Keep appointment for your echo next week and cardiology next month.   Return to the ED for any worsening symptoms including continuation of passing out, worsening chest pain/shortness of breath, one sided weakness/numbness, speech changes, facial droop, severe headache, vision changes, excessive sweating associated with chest pain, nausea or vomiting associated with chest pain, or any other new/concerning symptoms.

## 2020-03-14 DIAGNOSIS — R55 Syncope and collapse: Secondary | ICD-10-CM

## 2020-03-14 HISTORY — DX: Syncope and collapse: R55

## 2020-03-14 NOTE — Assessment & Plan Note (Addendum)
Based on the patient's description of diaphoresis, blurred vision, feeling hot, urinary incontinence, decreased hearing in the moments immediately surrounding the syncopal episode I think the patient most likely has a neurogenic cause of her syncope.  Cannot rule out cardiogenic, therefore will get echocardiogram that the cardiologist can follow-up on on her visit later this month.  Reviewed EKG from the ED, which did not show anything concerning, but patient may need 24-hour monitor or longer.  Patient was also admittedly dehydrated that day so could be multifactorial with orthostatic and neurogenic syncope.  Unsure if her continued vertigo symptoms are a result of her neurogenic syncope or if a separate issue. -Follow-up echo and cardiology recommendations -Gave patient information to call vestibular physical therapy as they have not called her yet -If vertigo continues to be a problem may need neurology referral.

## 2020-03-17 ENCOUNTER — Other Ambulatory Visit: Payer: Self-pay

## 2020-03-17 ENCOUNTER — Ambulatory Visit (HOSPITAL_COMMUNITY)
Admission: RE | Admit: 2020-03-17 | Discharge: 2020-03-17 | Disposition: A | Discharge status: Home / Self Care | Payer: Medicaid Other | Source: Ambulatory Visit | Attending: Family Medicine | Admitting: Family Medicine

## 2020-03-17 ENCOUNTER — Ambulatory Visit (HOSPITAL_COMMUNITY): Payer: Medicaid Other | Admitting: Licensed Clinical Social Worker

## 2020-03-17 DIAGNOSIS — R0789 Other chest pain: Secondary | ICD-10-CM | POA: Insufficient documentation

## 2020-03-17 DIAGNOSIS — R55 Syncope and collapse: Secondary | ICD-10-CM | POA: Diagnosis not present

## 2020-03-17 DIAGNOSIS — Z87891 Personal history of nicotine dependence: Secondary | ICD-10-CM | POA: Insufficient documentation

## 2020-03-17 DIAGNOSIS — R0602 Shortness of breath: Secondary | ICD-10-CM | POA: Diagnosis not present

## 2020-03-17 NOTE — Progress Notes (Signed)
  Echocardiogram 2D Echocardiogram has been performed.  Leta Jungling M 03/17/2020, 3:26 PM

## 2020-03-18 ENCOUNTER — Ambulatory Visit (INDEPENDENT_AMBULATORY_CARE_PROVIDER_SITE_OTHER): Payer: Medicaid Other | Admitting: Family Medicine

## 2020-03-18 ENCOUNTER — Other Ambulatory Visit: Payer: Self-pay

## 2020-03-18 ENCOUNTER — Encounter: Payer: Self-pay | Admitting: Family Medicine

## 2020-03-18 VITALS — BP 108/68 | HR 78 | Wt 134.0 lb

## 2020-03-18 DIAGNOSIS — K219 Gastro-esophageal reflux disease without esophagitis: Secondary | ICD-10-CM

## 2020-03-18 DIAGNOSIS — F411 Generalized anxiety disorder: Secondary | ICD-10-CM | POA: Diagnosis present

## 2020-03-18 MED ORDER — OMEPRAZOLE 20 MG PO CPDR: 20.0000 mg | DELAYED_RELEASE_CAPSULE | Freq: Every day | ORAL | 3 refills | Status: AC

## 2020-03-18 MED ORDER — SERTRALINE HCL 25 MG PO TABS: 25.0000 mg | ORAL_TABLET | Freq: Every day | ORAL | 0 refills | Status: DC

## 2020-03-18 MED ORDER — HYDROXYZINE HCL 10 MG PO TABS: 10.0000 mg | ORAL_TABLET | Freq: Three times a day (TID) | ORAL | 0 refills | Status: DC | PRN

## 2020-03-18 NOTE — Progress Notes (Signed)
    SUBJECTIVE:   CHIEF COMPLAINT / HPI:   Anxiety state Patient with long history of anxiety impacted by life stressors.  She is in a safer life situation but feels that her anxiety will be better controlled if she were to consider starting medication which have discussed on a couple of occasions.  She does have some old hydroxyzine lying around the house that she has used a couple of occasions feels it helps a little bit.  She is safe with no SI/HI  PERTINENT  PMH / PSH:   OBJECTIVE:   BP 108/68   Pulse 78   Wt 134 lb (60.8 kg)   LMP  (LMP Unknown)   SpO2 98%   BMI 25.32 kg/m   General: Alert and pleasant, no medical distress Psych: Patient is anxious, but is processing information appropriately and denies SI/HI  ASSESSMENT/PLAN:   Anxiety state Patient with long history of anxiety impacted by life stressors.  She is in a safer life situation but feels that her anxiety will be better controlled if she were to consider starting medication which have discussed on a couple of occasions.  She does have some old hydroxyzine lying around the house that she has used a couple of occasions feels it helps a little bit.  She is safe with no SI/HI  Will refill hydroxyzine which she can use as a 3 times daily as needed medication while waiting for Zoloft to take effect.  We discussed the Zoloft can take up to 4 weeks to really settle in and to know if a particular dose is effective or not.  We discussed the concept of positive and negative symptoms and emergency precautions were given.  She understands that I will be leaving and she will be following up and titrating this medication with another physician in our office.  Patient understands  Chronic GERD Refill all prescriptions PPI the patient had stopped using, mild symptoms     Marthenia Rolling, DO [facility]

## 2020-03-18 NOTE — Patient Instructions (Signed)
Today we talked about starting anxiety medicine free.  You can use the hydroxyzine to try and calm immediate issues up to 3 times a day.  I think the long-term answer will be starting the Zoloft which she will start taking daily.  You should come back in 4 weeks to discuss if this is improved things for you.  If you get any negative symptoms which means something feels wrong because of this medicine it is important you call us faster and try and get off of this medicine.  Remember that if you start to feel unsafe or that you might hurt yourself to please reach out to [place] but I am glad that this is not a problem for you right now.  Please check in with Dr. Dion Saucier to discuss the back pain issue as he is your surgeon.  Dr. Parke Simmers

## 2020-03-19 DIAGNOSIS — K219 Gastro-esophageal reflux disease without esophagitis: Secondary | ICD-10-CM | POA: Insufficient documentation

## 2020-03-19 NOTE — Assessment & Plan Note (Signed)
Refill all prescriptions PPI the patient had stopped using, mild symptoms

## 2020-03-19 NOTE — Assessment & Plan Note (Signed)
Patient with long history of anxiety impacted by life stressors.  She is in a safer life situation but feels that her anxiety will be better controlled if she were to consider starting medication which have discussed on a couple of occasions.  She does have some old hydroxyzine lying around the house that she has used a couple of occasions feels it helps a little bit.  She is safe with no SI/HI  Will refill hydroxyzine which she can use as a 3 times daily as needed medication while waiting for Zoloft to take effect.  We discussed the Zoloft can take up to 4 weeks to really settle in and to know if a particular dose is effective or not.  We discussed the concept of positive and negative symptoms and emergency precautions were given.  She understands that I will be leaving and she will be following up and titrating this medication with another physician in our office.  Patient understands

## 2020-03-24 ENCOUNTER — Other Ambulatory Visit: Payer: Self-pay

## 2020-03-24 ENCOUNTER — Ambulatory Visit (HOSPITAL_COMMUNITY): Payer: Medicaid Other | Admitting: Psychiatry

## 2020-03-26 ENCOUNTER — Telehealth: Payer: Self-pay | Admitting: Family Medicine

## 2020-03-26 ENCOUNTER — Other Ambulatory Visit: Payer: Self-pay

## 2020-03-26 ENCOUNTER — Encounter (HOSPITAL_COMMUNITY): Payer: Self-pay

## 2020-03-26 ENCOUNTER — Emergency Department (HOSPITAL_COMMUNITY)
Admission: EM | Admit: 2020-03-26 | Discharge: 2020-03-26 | Disposition: A | Discharge status: Home / Self Care | Payer: Medicaid Other | Attending: Emergency Medicine | Admitting: Emergency Medicine

## 2020-03-26 DIAGNOSIS — Z96641 Presence of right artificial hip joint: Secondary | ICD-10-CM | POA: Insufficient documentation

## 2020-03-26 DIAGNOSIS — R002 Palpitations: Secondary | ICD-10-CM | POA: Insufficient documentation

## 2020-03-26 DIAGNOSIS — Z79899 Other long term (current) drug therapy: Secondary | ICD-10-CM | POA: Diagnosis not present

## 2020-03-26 DIAGNOSIS — Z96642 Presence of left artificial hip joint: Secondary | ICD-10-CM | POA: Insufficient documentation

## 2020-03-26 DIAGNOSIS — R55 Syncope and collapse: Secondary | ICD-10-CM

## 2020-03-26 DIAGNOSIS — F1210 Cannabis abuse, uncomplicated: Secondary | ICD-10-CM | POA: Diagnosis not present

## 2020-03-26 DIAGNOSIS — R42 Dizziness and giddiness: Secondary | ICD-10-CM | POA: Diagnosis not present

## 2020-03-26 DIAGNOSIS — F17210 Nicotine dependence, cigarettes, uncomplicated: Secondary | ICD-10-CM | POA: Insufficient documentation

## 2020-03-26 LAB — URINALYSIS, ROUTINE W REFLEX MICROSCOPIC
Bilirubin Urine: NEGATIVE
Glucose, UA: NEGATIVE mg/dL
Hgb urine dipstick: NEGATIVE
Ketones, ur: NEGATIVE mg/dL
Leukocytes,Ua: NEGATIVE
Nitrite: NEGATIVE
Protein, ur: NEGATIVE mg/dL
Specific Gravity, Urine: 1.013 (ref 1.005–1.030)
pH: 7 (ref 5.0–8.0)

## 2020-03-26 LAB — CBC
HCT: 38.1 % (ref 36.0–46.0)
Hemoglobin: 12.1 g/dL (ref 12.0–15.0)
MCH: 29.3 pg (ref 26.0–34.0)
MCHC: 31.8 g/dL (ref 30.0–36.0)
MCV: 92.3 fL (ref 80.0–100.0)
Platelets: 329 10*3/uL (ref 150–400)
RBC: 4.13 MIL/uL (ref 3.87–5.11)
RDW: 13.9 % (ref 11.5–15.5)
WBC: 9.4 10*3/uL (ref 4.0–10.5)
nRBC: 0 % (ref 0.0–0.2)

## 2020-03-26 LAB — BASIC METABOLIC PANEL
Anion gap: 9 (ref 5–15)
BUN: 9 mg/dL (ref 6–20)
CO2: 24 mmol/L (ref 22–32)
Calcium: 9.6 mg/dL (ref 8.9–10.3)
Chloride: 108 mmol/L (ref 98–111)
Creatinine, Ser: 0.74 mg/dL (ref 0.44–1.00)
GFR calc Af Amer: >60 mL/min (ref >60)
GFR calc non Af Amer: >60 mL/min (ref >60)
Glucose, Bld: 99 mg/dL (ref 70–99)
Potassium: 4.3 mmol/L (ref 3.5–5.1)
Sodium: 141 mmol/L (ref 135–145)

## 2020-03-26 LAB — CBG MONITORING, ED: Glucose-Capillary: 96 mg/dL (ref 70–99)

## 2020-03-26 MED ORDER — SODIUM CHLORIDE 0.9% FLUSH: 3.0000 mL | Freq: Once | INTRAVENOUS | Status: DC

## 2020-03-26 NOTE — Discharge Instructions (Signed)
Your lab work is very reassuring today.  Your EKG is unchanged. Please stay hydrated, and follow-up closely with your primary care provider regarding your recurrent episodes of lightheadedness. Return to the ER for new or worsening symptoms.

## 2020-03-26 NOTE — Telephone Encounter (Signed)
Pt want DR. Bland to know she is currently at the ER with vertigo, spasms, and that same strange feeling she had before.

## 2020-03-26 NOTE — ED Triage Notes (Signed)
Patient arrived by [facility] for intermittent dizziness x 1 month, has been seen in ED for same. Patient also complains of left sided neck spasm that come with the dizziness. Patient denies pain, alert and oriented, states that she has not taken her anxiety meds for several days

## 2020-03-26 NOTE — ED Notes (Signed)
Pt expresses understanding of d/c paperwork

## 2020-03-26 NOTE — ED Provider Notes (Signed)
[facility] EMERGENCY DEPARTMENT Provider Note   CSN: [id number] Arrival date & time: 03/26/20  0857     History Chief Complaint  Patient presents with  . Near Syncope    Bridget [name] is a 59 y.o. female w hx of vasovagal syncope, anxiety, fibromyalgia, BP2D/o, schizoaffective disorder, presenting emergency department with recurrent episode of lightheadedness and near syncope today.  She states she was sitting in a chair talking to her son on the phone when she had recurrent symptoms of lightheadedness and palpitations.  No chest pain.  She did not pass out.  No current spinning dizziness or vomiting.  She has been evaluated 2 times within the last month for similar symptoms.  She has had CT scan of her head which was unremarkable, multiple EKGs and blood testing done which was all reassuring.  She is currently being worked up outpatient by her PCP for these episodes.  This includes, pending neurology consultation for possible neurogenic cause.  She recently had echocardiogram which was reportedly reassuring.  Per PCP documentation, it is also suggested she may benefit from a 24-hour Holter monitor.  She states symptoms today are not any different or worse from previous episodes.  The history is provided by the patient and medical records.       Past Medical History:  Diagnosis Date  . Bipolar II disorder (HCC)   . Community acquired pneumonia of right upper lobe of lung 08/14/2017  . Depression   . Obesity, morbid (HCC)   . Osteoarthritis     Patient Active Problem List   Diagnosis Date Noted  . Chronic GERD 03/19/2020  . Vasovagal syncope 03/14/2020  . Dyspnea on exertion 03/11/2020  . Dizziness 03/11/2020  . Adhesive capsulitis of right shoulder 01/02/2020  . High risk social situation 03/26/2019  . Cramp in muscle 12/11/2018  . TSH deficiency 10/12/2018  . Encounter for screening mammogram for malignant neoplasm of breast 10/09/2018  . Unintentional  weight loss 10/09/2018  . Prediabetes 10/08/2018  . Possible exposure to STD 06/13/2018  . Weight loss 06/13/2018  . Lower respiratory infection 12/21/2017  . Foot pain, bilateral 10/27/2017  . Routine screening for STI (sexually transmitted infection) 10/27/2017  . Gait instability 10/27/2017  . Lesion of stomach 09/07/2017  . Nonspecific abnormal findings on imaging of lung 08/21/2017  . Mood disorder (HCC)   . Chronic pain syndrome   . Anxiety state 03/07/2017  . Primary localized osteoarthritis of left hip 02/07/2017  . Fibromyalgia 05/15/2014  . Colonoscopy refused 05/15/2014  . Healthcare maintenance 05/15/2014  . Schizoaffective disorder (HCC) 12/13/2012  . Other and unspecified hyperlipidemia 12/13/2012  . Plantar fasciitis 04/16/2012  . Primary osteoarthritis of right hip 08/15/2011  . Obesity, morbid (HCC)   . Bipolar 2 disorder (HCC) 06/15/2011  . TOBACCO DEPENDENCE 11/30/2006  . OSTEOARTHRITIS OF SPINE, NOS 11/30/2006  . INCONTINENCE, URGE 11/30/2006    Past Surgical History:  Procedure Laterality Date  . BONE SPURS     REMOVED FROM SHOULDERS  . EYE SURGERY     STY REMOVED LEFT EYE  1992  . ROTATOR CUFF REPAIR     BILATERAL  . TOTAL HIP ARTHROPLASTY  08/15/2011   Procedure: TOTAL HIP ARTHROPLASTY;  Surgeon: Eulas Post;  Location: [facility] OR;  Service: Orthopedics;  Laterality: Right;  . TOTAL HIP ARTHROPLASTY Left 02/07/2017  . TOTAL HIP ARTHROPLASTY Left 02/07/2017   Procedure: LEFT TOTAL HIP ARTHROPLASTY;  Surgeon: Teryl Lucy, MD;  Location: [facility] OR;  Service: Orthopedics;  Laterality: Left;     OB History   No obstetric history on file.     Family History  Problem Relation Age of Onset  . Bipolar disorder Sister   . Hypertension Sister   . Hypertension Brother   . Diabetes Brother   . Heart disease Brother   . Hypertension Other   . Hypertension Mother   . Rheum arthritis Mother   . Heart disease Mother   . Hypertension Maternal Aunt   .  Cancer Father   . Asthma Daughter   . Asthma Son     Social History   Tobacco Use  . Smoking status: Current Every Day Smoker    Packs/day: 0.50    Years: 20.00    Pack years: 10.00    Types: Cigarettes  . Smokeless tobacco: Former Systems developer    Quit date: 02/06/2017  Vaping Use  . Vaping Use: Never used  Substance Use Topics  . Alcohol use: No  . Drug use: Yes    Types: Marijuana    Home Medications Prior to Admission medications   Medication Sig Start Date End Date Taking? Authorizing Provider  acetaminophen (TYLENOL) 650 MG CR tablet Take 1,300 mg by mouth every 8 (eight) hours as needed for pain.   Yes [provider]  BLACK CURRANT SEED OIL PO Take 1 capsule by mouth daily.   Yes [provider]  ergocalciferol (DRISDOL) 200 MCG/ML drops Take 4,000 Units by mouth daily.   Yes [provider]  hydrOXYzine (ATARAX/VISTARIL) 10 MG tablet Take 1 tablet (10 mg total) by mouth 3 (three) times daily as needed. Patient taking differently: Take 10 mg by mouth 3 (three) times daily as needed for anxiety.  03/18/20  Yes Bland, Scott, DO  Ibuprofen-diphenhydrAMINE HCl 200-25 MG CAPS Take 2 tablets by mouth at bedtime as needed (pain and sleep).   Yes [provider]  methocarbamol (ROBAXIN) 500 MG tablet Take 1 tablet (500 mg total) by mouth every 8 (eight) hours as needed for muscle spasms. 01/19/20  Yes Robyn Haber, MD  Multiple Vitamin (MULTIVITAMIN) capsule Take 1 capsule by mouth daily.   Yes [provider]  omeprazole (PRILOSEC) 20 MG capsule Take 1 capsule (20 mg total) by mouth daily. Patient taking differently: Take 20 mg by mouth daily as needed (acid reflux).  03/18/20  Yes Sherene Sires, DO  sertraline (ZOLOFT) 25 MG tablet Take 1 tablet (25 mg total) by mouth daily. 03/18/20 04/17/20 Yes Bland, Scott, DO  Nutritional Supplements (ENSURE COMPLETE SHAKE) LIQD Take 1 Bottle by mouth daily as needed. Patient not taking: Reported on  03/13/2020 10/08/18   Sherene Sires, DO    Allergies    Pregabalin, Tramadol, Gabapentin, and Cymbalta [duloxetine hcl]  Review of Systems   Review of Systems  Eyes: Negative for visual disturbance.  Cardiovascular: Negative for chest pain.  Neurological: Positive for light-headedness. Negative for headaches.  All other systems reviewed and are negative.   Physical Exam Updated Vital Signs BP 132/86 (BP Location: Right Arm)   Pulse 85   Temp 98.3 F (36.8 C)   Resp 20   Ht 5\' 1"  (1.549 m)   Wt 60.1 kg   LMP  (LMP Unknown)   SpO2 99%   BMI 25.05 kg/m   Physical Exam Vitals and nursing note reviewed.  Constitutional:      General: She is not in acute distress.    Appearance: She is well-developed.  HENT:  Head: Normocephalic and atraumatic.  Eyes:     Extraocular Movements: Extraocular movements intact.     Conjunctiva/sclera: Conjunctivae normal.     Pupils: Pupils are equal, round, and reactive to light.  Cardiovascular:     Rate and Rhythm: Normal rate and regular rhythm.  Pulmonary:     Effort: Pulmonary effort is normal. No respiratory distress.     Breath sounds: Normal breath sounds.  Abdominal:     Palpations: Abdomen is soft.  Skin:    General: Skin is warm.  Neurological:     Mental Status: She is alert.     Comments: Mental Status:  Alert, oriented, thought content appropriate, able to give a coherent history. Speech fluent without evidence of aphasia. Able to follow 2 step commands without difficulty.  Cranial Nerves grossly intact. Motor:  Normal tone. equal strength in upper and lower extremities bilaterally including strong and equal grip strength and dorsiflexion/plantar flexion Sensory: grossly normal in all extremities.  Cerebellar: normal finger-to-nose with bilateral upper extremities Gait: normal gait and balance CV: distal pulses palpable throughout    Psychiatric:        Behavior: Behavior normal.     ED Results / Procedures /  Treatments   Labs (all labs ordered are listed, but only abnormal results are displayed) Labs Reviewed  URINALYSIS, ROUTINE W REFLEX MICROSCOPIC - Abnormal; Notable for the following components:      Result Value   Color, Urine STRAW (*)    All other components within normal limits  BASIC METABOLIC PANEL  CBC  CBG MONITORING, ED    EKG EKG Interpretation  Date/Time:  Thursday March 26 2020 09:01:55 EDT Ventricular Rate:  68 PR Interval:  122 QRS Duration: 76 QT Interval:  432 QTC Calculation: 459 R Axis:   33 Text Interpretation: Normal sinus rhythm Nonspecific T wave abnormality Abnormal ECG When compared to prior, no significant changes seen. No sTEMI Confirmed by Theda Belfast ([id number]) on 03/26/2020 1:56:40 PM   Radiology No results found.  Procedures Procedures (including critical care time)  Medications Ordered in ED Medications  sodium chloride flush (NS) 0.9 % injection 3 mL (3 mLs Intravenous Not Given 03/26/20 1601)    ED Course  I have reviewed the triage vital signs and the nursing notes.  Pertinent labs & imaging results that were available during my care of the patient were reviewed by me and considered in my medical decision making (see chart for details).    MDM Rules/Calculators/A&P                         Patient presenting with recurrent episode of near syncope, lightheadedness as a prodrome.  Also reports a fluttering feeling in her chest.  She has been worked up multiple times in the ED this month for similar episodes.  The episode today is unchanged from previous.  No concerning symptoms including no chest pain, severe headache, neuro findings.  EKG is unchanged.  Labs are normal.  UA is negative.  Vital signs are stable.  Orthostatics are normal.  Recommend she continue following outpatient for these recurrent episodes, no evidence of emergent pathology at this time.  She may benefit from Holter monitor.  She has neurology consult pending.  Patient  discussed with Dr. Rush Landmark.  Patient discharged.   Final Clinical Impression(s) / ED Diagnoses Final diagnoses:  Near syncope    Rx / DC Orders ED Discharge Orders    None  Omarius Grantham, [place] N, PA-C 03/26/20 1618    Tegeler, Canary Brim, MD 03/26/20 [phone]

## 2020-03-27 ENCOUNTER — Other Ambulatory Visit: Payer: Self-pay

## 2020-03-27 ENCOUNTER — Ambulatory Visit (INDEPENDENT_AMBULATORY_CARE_PROVIDER_SITE_OTHER): Payer: Medicaid Other | Admitting: Family Medicine

## 2020-03-27 ENCOUNTER — Encounter: Payer: Self-pay | Admitting: Family Medicine

## 2020-03-27 VITALS — BP 110/60 | HR 76 | Wt 136.1 lb

## 2020-03-27 DIAGNOSIS — R55 Syncope and collapse: Secondary | ICD-10-CM

## 2020-03-27 NOTE — Progress Notes (Signed)
    SUBJECTIVE:   CHIEF COMPLAINT / HPI:   Patient presents for ED and outpatient work-up for recurrent syncopal episodes.  Patient has been to the ED 5 times in the past month due to the recurrent of these episodes.  Last episode was 24 hours prior to this visit.  Work-up thus far has been relatively unremarkable including negative urine analysis and cultures, negative troponins, normal BMPs, normal CT head, normal echocardiogram. EKG has been unchanged with sinus rhythm with nonspecific T wave changes.  Patient did have a referral to neuro rehab.  She also has a referral to cardiology.  She says that she has a referral to neurology but I cannot find it listed in the patient's chart.  When these episodes occurs patient feels lightheaded when he gets develop tunnel vision.  She also develops palpitations.  Also has a history significant for anxiety and bipolar.  PERTINENT  PMH / PSH: Anxiety, bipolar 2  OBJECTIVE:   BP 110/60   Pulse 76   Wt 136 lb 2 oz (61.7 kg)   LMP  (LMP Unknown)   SpO2 99%   BMI 25.72 kg/m   Gen: NAD, resting comfortably CV: RRR with no murmurs appreciated Pulm: NWOB, CTAB with no crackles, wheezes, or rhonchi GI: Normal bowel sounds present. Soft, Nontender, Nondistended. MSK: no edema, cyanosis, or clubbing noted Skin: warm, dry Neuro: grossly normal, moves all extremities Psych: Normal affect and thought content   ASSESSMENT/PLAN:   Syncope Syncope, recurrent.  Work-up thus far has been negative, but there are still important elements to rule out including arrhythmia, neurogenic cause.  Patient already has upcoming appointment with cardiology. -Referral to neurology -Holter monitor     Garnette Gunner, MD [facility]

## 2020-03-27 NOTE — Patient Instructions (Signed)
We have put a referral into Neurology. The office will call you with the arrangements.  We have ordered you a Holter monitor. Please go here to get it.  Address: [address], [place], [place] [address]

## 2020-03-30 ENCOUNTER — Encounter: Payer: Self-pay | Admitting: *Deleted

## 2020-03-30 ENCOUNTER — Encounter: Payer: Self-pay | Admitting: Neurology

## 2020-03-30 ENCOUNTER — Telehealth: Payer: Self-pay | Admitting: *Deleted

## 2020-03-30 NOTE — Telephone Encounter (Signed)
Patient enrolled for [employer] to ship a 3 day ZIO XT long term holter monitor to her home. 

## 2020-03-30 NOTE — Assessment & Plan Note (Signed)
Syncope, recurrent.  Work-up thus far has been negative, but there are still important elements to rule out including arrhythmia, neurogenic cause.  Patient already has upcoming appointment with cardiology. -Referral to neurology -Holter monitor

## 2020-03-31 ENCOUNTER — Ambulatory Visit (HOSPITAL_COMMUNITY): Payer: Medicaid Other | Admitting: Licensed Clinical Social Worker

## 2020-04-01 ENCOUNTER — Ambulatory Visit (INDEPENDENT_AMBULATORY_CARE_PROVIDER_SITE_OTHER): Payer: Medicaid Other

## 2020-04-01 DIAGNOSIS — R55 Syncope and collapse: Secondary | ICD-10-CM

## 2020-04-01 NOTE — Progress Notes (Signed)
Cardiology Office Note:   Date:  04/02/2020  NAME:  Bridget [name]    MRN: [id number] DOB:  1961-01-06   PCP:  Sandre Kitty, MD  Cardiologist:  No primary care provider on file.   Referring MD: McDiarmid, Leighton Roach, MD   Chief Complaint  Patient presents with   Loss of Consciousness   History of Present Illness:   Bridget [name] is a 60 y.o. female with a hx of depression, obesity who is being seen today for the evaluation of syncope at the request of Sandre Kitty, MD. Several syncopal events recently. TSH normal. Troponin negative in the ER. EKG normal. Echo normal.  She presents for evaluation of several episodes of syncope.  They have apparently started in April.  She reports the first event was associated with neck pressure that lasted for a few weeks.  She reports that she became dizzy and lightheaded when morning.  She reports that she felt she was in a pass out and sat down.  She is apparently had multiple episodes where she feels dizzy and lightheaded when walking or when standing for prolonged periods.  She reports that she then feels if she is going to faint.  She has reported decreased p.o. intake and some weight loss over the past few months.  She reports that she is now drinking plenty of water.  She did have an episode last week.  Is been to the emergency room several times where her EKG has been normal and her troponins have been negative.  She recently completed an echocardiogram that was normal.  She is currently wearing a Zio patch that was ordered by her primary care physician.  She does have a history of smoking but quit 3 weeks ago.  She smoked for 20 years.  She does use occasional marijuana.  No excess alcohol use reported.  She does not consume excess caffeinated beverages.  She does report a family history of heart disease in her mother.  She endorses no chest pain or shortness of breath prior to the episodes of passing out.  She reports that her rather predictable.   She reports that if she sits down she cannot lessen her symptoms.  Her cardiovascular exam today is benign there are no murmurs rubs or gallops.  Recent thyroid studies are normal.  Recent lab work is normal as well.  She is not anemic with a hemoglobin of 12.1.  She did have a lung cancer screening CT done which shows coronary calcifications.  Despite this she reports she can walk for several miles without any chest pain or shortness of breath.  She has no symptoms concerning for angina.  She has reported starting Zoloft recently.  She is not on any other new medications.  A1c 5.5 TSH 0.40  Past Medical History: Past Medical History:  Diagnosis Date   Bipolar II disorder (HCC)    Community acquired pneumonia of right upper lobe of lung 08/14/2017   Depression    Obesity, morbid (HCC)    Osteoarthritis     Past Surgical History: Past Surgical History:  Procedure Laterality Date   BONE SPURS     REMOVED FROM SHOULDERS   EYE SURGERY     STY REMOVED LEFT EYE  1992   ROTATOR CUFF REPAIR     BILATERAL   TOTAL HIP ARTHROPLASTY  08/15/2011   Procedure: TOTAL HIP ARTHROPLASTY;  Surgeon: Eulas Post;  Location: [facility] OR;  Service: Orthopedics;  Laterality: Right;  TOTAL HIP ARTHROPLASTY Left 02/07/2017   TOTAL HIP ARTHROPLASTY Left 02/07/2017   Procedure: LEFT TOTAL HIP ARTHROPLASTY;  Surgeon: Teryl LucyLandau, Joshua, MD;  Location: [facility] OR;  Service: Orthopedics;  Laterality: Left;    Current Medications: Current Meds  Medication Sig   acetaminophen (TYLENOL) 650 MG CR tablet Take 1,300 mg by mouth every 8 (eight) hours as needed for pain.   BLACK CURRANT SEED OIL PO Take 1 capsule by mouth daily.   ergocalciferol (DRISDOL) 200 MCG/ML drops Take 4,000 Units by mouth daily.   hydrOXYzine (ATARAX/VISTARIL) 10 MG tablet Take 1 tablet (10 mg total) by mouth 3 (three) times daily as needed.   Ibuprofen-diphenhydrAMINE HCl 200-25 MG CAPS Take 2 tablets by mouth at bedtime as needed (pain  and sleep).   methocarbamol (ROBAXIN) 500 MG tablet Take 1 tablet (500 mg total) by mouth every 8 (eight) hours as needed for muscle spasms.   Multiple Vitamin (MULTIVITAMIN) capsule Take 1 capsule by mouth daily.   Nutritional Supplements (ENSURE COMPLETE SHAKE) LIQD Take 1 Bottle by mouth daily as needed.   omeprazole (PRILOSEC) 20 MG capsule Take 1 capsule (20 mg total) by mouth daily. (Patient taking differently: Take 20 mg by mouth daily as needed (acid reflux). )   sertraline (ZOLOFT) 25 MG tablet Take 1 tablet (25 mg total) by mouth daily.     Allergies:    Pregabalin, Tramadol, Gabapentin, and Cymbalta [duloxetine hcl]   Social History: Social History   Socioeconomic History   Marital status: Single    Spouse name: Not on file   Number of children: 4   Years of education: Not on file   Highest education level: Not on file  Occupational History   Occupation: disabled  Tobacco Use   Smoking status: Current Every Day Smoker    Packs/day: 0.50    Years: 20.00    Pack years: 10.00    Types: Cigarettes   Smokeless tobacco: Former NeurosurgeonUser    Quit date: 02/06/2017  Vaping Use   Vaping Use: Never used  Substance and Sexual Activity   Alcohol use: No   Drug use: Yes   Sexual activity: Yes  Other Topics Concern   Not on file  Social History Narrative   Not on file   Social Determinants of Health   Financial Resource Strain:    Difficulty of Paying Living Expenses:   Food Insecurity:    Worried About Programme researcher, broadcasting/film/videounning Out of Food in the Last Year:    Baristaan Out of Food in the Last Year:   Transportation Needs:    Freight forwarderLack of Transportation (Medical):    Lack of Transportation (Non-Medical):   Physical Activity:    Days of Exercise per Week:    Minutes of Exercise per Session:   Stress:    Feeling of Stress :   Social Connections:    Frequency of Communication with Friends and Family:    Frequency of Social Gatherings with Friends and Family:    Attends  Religious Services:    Active Member of Clubs or Organizations:    Attends Engineer, structuralClub or Organization Meetings:    Marital Status:      Family History: The patient's family history includes Asthma in her daughter and son; Bipolar disorder in her sister; Cancer in her father; Diabetes in her brother; Heart disease in her brother and mother; Hypertension in her brother, maternal aunt, mother, sister, and another family member; Rheum arthritis in her mother.  ROS:   All other ROS reviewed  and negative. Pertinent positives noted in the HPI.     EKGs/Labs/Other Studies Reviewed:   The following studies were personally reviewed by me today:  EKG:  EKG is ordered today.  The ekg ordered today demonstrates normal sinus rhythm, heart rate 68, no acute ST-T changes, no evidence of prior infarction, and was personally reviewed by me.   TTE 03/17/2020 1. Left ventricular ejection fraction, by estimation, is 60 to 65%. The  left ventricle has normal function. The left ventricle has no regional  wall motion abnormalities. Left ventricular diastolic parameters were  normal.  2. Right ventricular systolic function is normal. The right ventricular  size is normal. There is normal pulmonary artery systolic pressure. The  estimated right ventricular systolic pressure is 17.6 mmHg.  3. The mitral valve is normal in structure. No evidence of mitral valve  regurgitation. No evidence of mitral stenosis.  4. The aortic valve is normal in structure. Aortic valve regurgitation is  not visualized. No aortic stenosis is present.  5. The inferior vena cava is normal in size with greater than 50%  respiratory variability, suggesting right atrial pressure of 3 mmHg.  Recent Labs: 03/06/2020: ALT 9; TSH 0.409 03/26/2020: BUN 9; Creatinine, Ser 0.74; Hemoglobin 12.1; Platelets 329; Potassium 4.3; Sodium 141   Recent Lipid Panel    Component Value Date/Time   CHOL 191 11/01/2016 1629   TRIG 143 11/01/2016 1629     HDL 37 (L) 11/01/2016 1629   CHOLHDL 5.2 (H) 11/01/2016 1629   VLDL 29 11/01/2016 1629   LDLCALC 125 (H) 11/01/2016 1629   LDLDIRECT 176 (H) 11/01/2012 1509    Physical Exam:   VS:  BP 126/78    Pulse 68    Ht 5\' 1"  (1.549 m)    Wt 140 lb 3.2 oz (63.6 kg)    LMP  (LMP Unknown)    SpO2 99%    BMI 26.49 kg/m    Wt Readings from Last 3 Encounters:  04/02/20 140 lb 3.2 oz (63.6 kg)  03/27/20 136 lb 2 oz (61.7 kg)  03/26/20 132 lb 9.6 oz (60.1 kg)    General: Well nourished, well developed, in no acute distress Heart: Atraumatic, normal size  Eyes: PEERLA, EOMI  Neck: Supple, no JVD Endocrine: No thryomegaly Cardiac: Normal S1, S2; RRR; no murmurs, rubs, or gallops Lungs: Clear to auscultation bilaterally, no wheezing, rhonchi or rales  Abd: Soft, nontender, no hepatomegaly  Ext: No edema, pulses 2+ Musculoskeletal: No deformities, BUE and BLE strength normal and equal Skin: Warm and dry, no rashes   Neuro: Alert and oriented to person, place, time, and situation, CNII-XII grossly intact, no focal deficits  Psych: Normal mood and affect   ASSESSMENT:   Bridget [name] is a 59 y.o. female who presents for the following: 1. Syncope and collapse   2. Vasovagal syncope     PLAN:   1. Syncope and collapse 2. Vasovagal syncope -Recurrent episodes of syncope.  I have benign.  Reports classic dizziness and lightheadedness and feels that she is going to faint.  Her EKG is normal.  She is in the emergency room several times and ruled out for an acute coronary syndrome.  Her echocardiogram is normal.  She is wearing a Zio patch but I do not suspect this is arrhythmia related.  She endorses no chest pain or shortness of breath prior the episodes.  The fact that she continues to have recurrent episodes without any sinister pathology is reassuring.  I  suspect this is just vasovagal syncope.  She does report symptoms that coincided with Zoloft use and this possibly is a medication side effect.   I have encouraged her to increase her water intake.  She should also increase salt intake.  If she has symptoms that are similar she can just sit down and circumvent her episodes.  I see no need for stress test at this time.  She can walk up to 3 miles without any chest pain or shortness of breath.  We will see how she does.  I will see her back in 2 months to reassess symptoms.  Disposition: No follow-ups on file.  Medication Adjustments/Labs and Tests Ordered: Current medicines are reviewed at length with the patient today.  Concerns regarding medicines are outlined above.  Orders Placed This Encounter  Procedures   EKG 12-Lead   No orders of the defined types were placed in this encounter.   Patient Instructions  Medication Instructions:  The current medical regimen is effective;  continue present plan and medications.  *If you need a refill on your cardiac medications before your next appointment, please call your pharmacy*    Follow-Up: At [facility], you and your health needs are our priority.  As part of our continuing mission to provide you with exceptional heart care, we have created designated Provider Care Teams.  These Care Teams include your primary Cardiologist (physician) and Advanced Practice Providers (APPs -  Physician Assistants and Nurse Practitioners) who all work together to provide you with the care you need, when you need it.  We recommend signing up for the patient portal called "MyChart".  Sign up information is provided on this After Visit Summary.  MyChart is used to connect with patients for Virtual Visits (Telemedicine).  Patients are able to view lab/test results, encounter notes, upcoming appointments, etc.  Non-urgent messages can be sent to your provider as well.   To learn more about what you can do with MyChart, go to [email or web address].    Your next appointment:   2 month(s)  The format for your next appointment:   In Person  Provider:     Lennie Odor, MD      Signed, Lenna Gilford. Flora Lipps, MD [facility]  [address] [place], [place] [address] [phone]  04/02/2020 11:02 AM

## 2020-04-02 ENCOUNTER — Ambulatory Visit: Payer: Medicaid Other | Admitting: Cardiovascular Disease

## 2020-04-02 ENCOUNTER — Encounter: Payer: Self-pay | Admitting: Cardiovascular Disease

## 2020-04-02 ENCOUNTER — Other Ambulatory Visit: Payer: Self-pay

## 2020-04-02 VITALS — BP 126/78 | HR 68 | Ht 61.0 in | Wt 140.2 lb

## 2020-04-02 DIAGNOSIS — R55 Syncope and collapse: Secondary | ICD-10-CM | POA: Diagnosis not present

## 2020-04-02 NOTE — Patient Instructions (Signed)
Medication Instructions:  The current medical regimen is effective;  continue present plan and medications.  *If you need a refill on your cardiac medications before your next appointment, please call your pharmacy*   Follow-Up: At [facility], you and your health needs are our priority.  As part of our continuing mission to provide you with exceptional heart care, we have created designated Provider Care Teams.  These Care Teams include your primary Cardiologist (physician) and Advanced Practice Providers (APPs -  Physician Assistants and Nurse Practitioners) who all work together to provide you with the care you need, when you need it.  We recommend signing up for the patient portal called "MyChart".  Sign up information is provided on this After Visit Summary.  MyChart is used to connect with patients for Virtual Visits (Telemedicine).  Patients are able to view lab/test results, encounter notes, upcoming appointments, etc.  Non-urgent messages can be sent to your provider as well.   To learn more about what you can do with MyChart, go to [email or web address].com.    Your next appointment:   2 month(s)  The format for your next appointment:   In Person  Provider:   [place] O'Neal, MD      

## 2020-04-07 ENCOUNTER — Encounter (HOSPITAL_COMMUNITY): Payer: Self-pay | Admitting: Psychiatry

## 2020-04-07 ENCOUNTER — Ambulatory Visit (INDEPENDENT_AMBULATORY_CARE_PROVIDER_SITE_OTHER): Payer: Medicaid Other | Admitting: Psychiatry

## 2020-04-07 ENCOUNTER — Other Ambulatory Visit: Payer: Self-pay

## 2020-04-07 DIAGNOSIS — F4310 Post-traumatic stress disorder, unspecified: Secondary | ICD-10-CM

## 2020-04-07 DIAGNOSIS — F331 Major depressive disorder, recurrent, moderate: Secondary | ICD-10-CM
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
## 2020-04-12 ENCOUNTER — Encounter (HOSPITAL_COMMUNITY): Payer: Self-pay | Admitting: Psychiatry

## 2020-04-14 ENCOUNTER — Ambulatory Visit (HOSPITAL_COMMUNITY): Payer: Medicaid Other | Admitting: Licensed Clinical Social Worker

## 2020-04-21 ENCOUNTER — Ambulatory Visit (INDEPENDENT_AMBULATORY_CARE_PROVIDER_SITE_OTHER): Payer: Medicaid Other | Admitting: Psychiatry

## 2020-04-21 ENCOUNTER — Other Ambulatory Visit: Payer: Self-pay

## 2020-04-21 DIAGNOSIS — F4310 Post-traumatic stress disorder, unspecified: Secondary | ICD-10-CM | POA: Diagnosis not present

## 2020-04-21 DIAGNOSIS — F411 Generalized anxiety disorder: Secondary | ICD-10-CM | POA: Diagnosis not present
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
## 2020-04-26 ENCOUNTER — Encounter (HOSPITAL_COMMUNITY): Payer: Self-pay

## 2020-04-26 ENCOUNTER — Ambulatory Visit (HOSPITAL_COMMUNITY)
Admission: EM | Admit: 2020-04-26 | Discharge: 2020-04-26 | Disposition: A | Discharge status: Home / Self Care | Payer: Medicaid Other | Attending: Family Medicine | Admitting: Family Medicine

## 2020-04-26 ENCOUNTER — Other Ambulatory Visit: Payer: Self-pay

## 2020-04-26 DIAGNOSIS — M25512 Pain in left shoulder: Secondary | ICD-10-CM

## 2020-04-26 DIAGNOSIS — M62838 Other muscle spasm: Secondary | ICD-10-CM

## 2020-04-26 MED ORDER — HYDROCODONE-ACETAMINOPHEN 5-325 MG PO TABS: 1.0000 | ORAL_TABLET | ORAL | 0 refills | Status: DC | PRN

## 2020-04-26 MED ORDER — CYCLOBENZAPRINE HCL 10 MG PO TABS
10.0000 mg | ORAL_TABLET | Freq: Two times a day (BID) | ORAL | 0 refills | Status: DC | PRN
Start: 2020-04-26 — End: 2020-06-06

## 2020-04-26 NOTE — ED Triage Notes (Signed)
Pt present left shoulder pain. Symptoms started 4 days ago.

## 2020-04-26 NOTE — ED Provider Notes (Signed)
[facility]   [id number] 04/26/20 Arrival Time: 1014  BP:ZWCHE PAIN  SUBJECTIVE: History from: patient. Bridget [name] is a 59 y.o. female complains of left shoulder pain that has been occurring for months. Reports that she has surgery scheduled with Dr Dion Saucier for 05/12/20. Reports pain with walking as arm is swinging. Reports that she has been taking tylenol with little relief. Also reports that she feels like she is having muscle spasms to the area as well. Denies similar symptoms in the past.  Denies fever, chills, erythema, ecchymosis, effusion, weakness, numbness and tingling, saddle paresthesias, loss of bowel or bladder function.      ROS: As per HPI.  All other pertinent ROS negative.     Past Medical History:  Diagnosis Date  . Bipolar II disorder (HCC)   . Community acquired pneumonia of right upper lobe of lung 08/14/2017  . Depression   . Obesity, morbid (HCC)   . Osteoarthritis    Past Surgical History:  Procedure Laterality Date  . BONE SPURS     REMOVED FROM SHOULDERS  . EYE SURGERY     STY REMOVED LEFT EYE  1992  . ROTATOR CUFF REPAIR     BILATERAL  . TOTAL HIP ARTHROPLASTY  08/15/2011   Procedure: TOTAL HIP ARTHROPLASTY;  Surgeon: Eulas Post;  Location: [facility] OR;  Service: Orthopedics;  Laterality: Right;  . TOTAL HIP ARTHROPLASTY Left 02/07/2017  . TOTAL HIP ARTHROPLASTY Left 02/07/2017   Procedure: LEFT TOTAL HIP ARTHROPLASTY;  Surgeon: Teryl Lucy, MD;  Location: [facility] OR;  Service: Orthopedics;  Laterality: Left;   Allergies  Allergen Reactions  . Pregabalin Shortness Of Breath  . Tramadol Itching    Makes feet tingle  . Gabapentin Rash and Other (See Comments)    Lightheaded, see spots  . Cymbalta [Duloxetine Hcl] Other (See Comments)    Paranoid thoughts   No current facility-administered medications on file prior to encounter.   Current Outpatient Medications on File Prior to Encounter  Medication Sig Dispense Refill  . acetaminophen  (TYLENOL) 650 MG CR tablet Take 1,300 mg by mouth every 8 (eight) hours as needed for pain.    Marland Kitchen BLACK CURRANT SEED OIL PO Take 1 capsule by mouth daily.    . ergocalciferol (DRISDOL) 200 MCG/ML drops Take 4,000 Units by mouth daily.    . hydrOXYzine (ATARAX/VISTARIL) 10 MG tablet Take 1 tablet (10 mg total) by mouth 3 (three) times daily as needed. 30 tablet 0  . Ibuprofen-diphenhydrAMINE HCl 200-25 MG CAPS Take 2 tablets by mouth at bedtime as needed (pain and sleep).    . methocarbamol (ROBAXIN) 500 MG tablet Take 1 tablet (500 mg total) by mouth every 8 (eight) hours as needed for muscle spasms. 30 tablet 0  . Multiple Vitamin (MULTIVITAMIN) capsule Take 1 capsule by mouth daily.    . Nutritional Supplements (ENSURE COMPLETE SHAKE) LIQD Take 1 Bottle by mouth daily as needed. 90 Bottle 1  . omeprazole (PRILOSEC) 20 MG capsule Take 1 capsule (20 mg total) by mouth daily. (Patient taking differently: Take 20 mg by mouth daily as needed (acid reflux). ) 30 capsule 3  . sertraline (ZOLOFT) 25 MG tablet Take 1 tablet (25 mg total) by mouth daily. 30 tablet 0   Social History   Socioeconomic History  . Marital status: Single    Spouse name: Not on file  . Number of children: 4  . Years of education: Not on file  . Highest education level: Not  on file  Occupational History  . Occupation: disabled  Tobacco Use  . Smoking status: Current Every Day Smoker    Packs/day: 0.50    Years: 20.00    Pack years: 10.00    Types: Cigarettes  . Smokeless tobacco: Former Neurosurgeon    Quit date: 02/06/2017  Vaping Use  . Vaping Use: Never used  Substance and Sexual Activity  . Alcohol use: No  . Drug use: Yes  . Sexual activity: Yes  Other Topics Concern  . Not on file  Social History Narrative  . Not on file   Social Determinants of Health   Financial Resource Strain:   . Difficulty of Paying Living Expenses:   Food Insecurity:   . Worried About Programme researcher, broadcasting/film/video in the Last Year:   . Garment/textile technologist in the Last Year:   Transportation Needs:   . Freight forwarder (Medical):   Marland Kitchen Lack of Transportation (Non-Medical):   Physical Activity:   . Days of Exercise per Week:   . Minutes of Exercise per Session:   Stress:   . Feeling of Stress :   Social Connections:   . Frequency of Communication with Friends and Family:   . Frequency of Social Gatherings with Friends and Family:   . Attends Religious Services:   . Active Member of Clubs or Organizations:   . Attends Banker Meetings:   Marland Kitchen Marital Status:   Intimate Partner Violence:   . Fear of Current or Ex-Partner:   . Emotionally Abused:   Marland Kitchen Physically Abused:   . Sexually Abused:    Family History  Problem Relation Age of Onset  . Bipolar disorder Sister   . Hypertension Sister   . Hypertension Brother   . Diabetes Brother   . Heart disease Brother   . Hypertension Other   . Hypertension Mother   . Rheum arthritis Mother   . Heart disease Mother   . Hypertension Maternal Aunt   . Cancer Father   . Asthma Daughter   . Asthma Son     OBJECTIVE:  Vitals:   04/26/20 1054  BP: 122/74  Pulse: 75  Resp: 18  Temp: 98.6 F (37 C)  TempSrc: Oral  SpO2: 100%    General appearance: ALERT; in no acute distress.  Head: NCAT Lungs: Normal respiratory effort CV:  pulses 2+ bilaterally. Cap refill < 2 seconds Musculoskeletal:  Inspection: Skin warm, dry, clear and intact without obvious erythema, effusion, or ecchymosis.  Palpation: Anterior and superior aspects of left shoulder tender to palpation ROM: limited ROM active and passive Skin: warm and dry Neurologic: Ambulates without difficulty; Sensation intact about the upper/ lower extremities Psychological: alert and cooperative; normal mood and affect  DIAGNOSTIC STUDIES:  No results found.   ASSESSMENT & PLAN:  1. Acute pain of left shoulder   2. Muscle spasm     Meds ordered this encounter  Medications  .  HYDROcodone-acetaminophen (NORCO/VICODIN) 5-325 MG tablet    Sig: Take 1 tablet by mouth every 4 (four) hours as needed for moderate pain or severe pain.    Dispense:  10 tablet    Refill:  0    Order Specific Question:   Supervising Provider    Answer:   Merrilee Jansky [id number]  . cyclobenzaprine (FLEXERIL) 10 MG tablet    Sig: Take 1 tablet (10 mg total) by mouth 2 (two) times daily as needed for muscle spasms.    Dispense:  20 tablet    Refill:  0    Order Specific Question:   Supervising Provider    Answer:   Merrilee Jansky [[id number]]   Sling applied in office Prescribed Norco Prescribed cyclobenzaprine Continue conservative management of rest, ice, and gentle stretches Take Norco as prescribed for pain relief (may cause abdominal discomfort, ulcers, and GI bleeds avoid taking with other NSAIDs) Take cyclobenzaprine at nighttime for symptomatic relief. Avoid driving or operating heavy machinery while using medication. Follow up with PCP if symptoms persist Return or go to the ER if you have any new or worsening symptoms (fever, chills, chest pain, abdominal pain, changes in bowel or bladder habits, pain radiating into lower legs)  Follow up with Dr Dion Saucier as scheduled  [place]rolled Substances Registry consulted for this patient. I feel the risk/benefit ratio today is favorable for proceeding with this prescription for a controlled substance. Medication sedation precautions given.  Reviewed expectations re: course of current medical issues. Questions answered. Outlined signs and symptoms indicating need for more acute intervention. Patient verbalized understanding. After Visit Summary given.       Moshe Cipro, NP 04/26/20 1139

## 2020-04-26 NOTE — Discharge Instructions (Signed)
Take the Norco as prescribed for pain  I have sent in cyclobenzaprine for you to take twice a day as needed for muscle spasms   Follow-up with Dr. Dion Saucier as scheduled  Follow up with your primary care provider or an orthopedist if you symptoms continue or worsen; or if you develop new symptoms, such as numbness, tingling, or weakness.

## 2020-04-28 ENCOUNTER — Encounter (HOSPITAL_COMMUNITY): Payer: Self-pay | Admitting: Psychiatry

## 2020-04-28 ENCOUNTER — Ambulatory Visit (HOSPITAL_COMMUNITY): Payer: Medicaid Other | Admitting: Licensed Clinical Social Worker

## 2020-05-19 ENCOUNTER — Ambulatory Visit (INDEPENDENT_AMBULATORY_CARE_PROVIDER_SITE_OTHER): Payer: Medicaid Other | Admitting: Psychiatry

## 2020-05-19 ENCOUNTER — Encounter (HOSPITAL_COMMUNITY): Payer: Self-pay | Admitting: Psychiatry

## 2020-05-19 ENCOUNTER — Other Ambulatory Visit: Payer: Self-pay

## 2020-05-19 DIAGNOSIS — F4310 Post-traumatic stress disorder, unspecified: Secondary | ICD-10-CM
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
## 2020-05-26 ENCOUNTER — Encounter (HOSPITAL_COMMUNITY): Payer: Self-pay | Admitting: Psychiatry

## 2020-05-26 ENCOUNTER — Ambulatory Visit (INDEPENDENT_AMBULATORY_CARE_PROVIDER_SITE_OTHER): Payer: Medicaid Other | Admitting: Psychiatry

## 2020-05-26 ENCOUNTER — Other Ambulatory Visit: Payer: Self-pay

## 2020-05-26 DIAGNOSIS — F4310 Post-traumatic stress disorder, unspecified: Secondary | ICD-10-CM

## 2020-05-26 DIAGNOSIS — F331 Major depressive disorder, recurrent, moderate: Secondary | ICD-10-CM | POA: Diagnosis not present
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
## 2020-06-02 ENCOUNTER — Ambulatory Visit (HOSPITAL_COMMUNITY): Payer: Medicaid Other | Admitting: Psychiatry

## 2020-06-06 ENCOUNTER — Ambulatory Visit (HOSPITAL_COMMUNITY)
Admission: EM | Admit: 2020-06-06 | Discharge: 2020-06-06 | Disposition: A | Discharge status: Home / Self Care | Payer: Medicaid Other | Attending: Family Medicine | Admitting: Family Medicine

## 2020-06-06 ENCOUNTER — Encounter (HOSPITAL_COMMUNITY): Payer: Self-pay | Admitting: Gynecology

## 2020-06-06 ENCOUNTER — Other Ambulatory Visit: Payer: Self-pay

## 2020-06-06 DIAGNOSIS — M25512 Pain in left shoulder: Secondary | ICD-10-CM | POA: Diagnosis not present

## 2020-06-06 DIAGNOSIS — G8929 Other chronic pain: Secondary | ICD-10-CM

## 2020-06-06 MED ORDER — NAPROXEN 500 MG PO TABS
500.0000 mg | ORAL_TABLET | Freq: Two times a day (BID) | ORAL | 0 refills | Status: DC
Start: 2020-06-06 — End: 2020-06-30

## 2020-06-06 MED ORDER — CYCLOBENZAPRINE HCL 10 MG PO TABS
10.0000 mg | ORAL_TABLET | Freq: Two times a day (BID) | ORAL | 0 refills | Status: DC | PRN
Start: 2020-06-06 — End: 2022-02-24

## 2020-06-06 MED ORDER — HYDROCODONE-ACETAMINOPHEN 5-325 MG PO TABS: 1.0000 | ORAL_TABLET | Freq: Four times a day (QID) | ORAL | 0 refills | Status: DC | PRN

## 2020-06-06 NOTE — Discharge Instructions (Signed)
Refilled your medication.  Follow-up with your orthopedic

## 2020-06-06 NOTE — ED Triage Notes (Signed)
Patient with left shoulder pain. Patient is schedule to have surgery on her left shoulder on 06/30/2020

## 2020-06-07 NOTE — ED Provider Notes (Signed)
[facility]    CSN: [id number] Arrival date & time: 06/06/20  1715      History   Chief Complaint Chief Complaint  Patient presents with  . Shoulder Pain    HPI Bridget [name] is a 59 y.o. female.   Patient is a 59 year old that presents today with chronic left shoulder pain.  She is scheduled for surgery on the shoulder on 06/30/2020.  She is out of her medication to help with pain.     Past Medical History:  Diagnosis Date  . Bipolar II disorder (HCC)   . Community acquired pneumonia of right upper lobe of lung 08/14/2017  . Depression   . Obesity, morbid (HCC)   . Osteoarthritis     Patient Active Problem List   Diagnosis Date Noted  . Chronic GERD 03/19/2020  . Syncope 03/14/2020  . Dyspnea on exertion 03/11/2020  . Dizziness 03/11/2020  . Adhesive capsulitis of right shoulder 01/02/2020  . High risk social situation 03/26/2019  . Cramp in muscle 12/11/2018  . TSH deficiency 10/12/2018  . Encounter for screening mammogram for malignant neoplasm of breast 10/09/2018  . Unintentional weight loss 10/09/2018  . Prediabetes 10/08/2018  . Possible exposure to STD 06/13/2018  . Weight loss 06/13/2018  . Lower respiratory infection 12/21/2017  . Foot pain, bilateral 10/27/2017  . Routine screening for STI (sexually transmitted infection) 10/27/2017  . Gait instability 10/27/2017  . Lesion of stomach 09/07/2017  . Nonspecific abnormal findings on imaging of lung 08/21/2017  . Mood disorder (HCC)   . Chronic pain syndrome   . Anxiety state 03/07/2017  . Primary localized osteoarthritis of left hip 02/07/2017  . Fibromyalgia 05/15/2014  . Colonoscopy refused 05/15/2014  . Healthcare maintenance 05/15/2014  . Schizoaffective disorder (HCC) 12/13/2012  . Other and unspecified hyperlipidemia 12/13/2012  . Plantar fasciitis 04/16/2012  . Primary osteoarthritis of right hip 08/15/2011  . Obesity, morbid (HCC)   . Bipolar 2 disorder (HCC) 06/15/2011  .  TOBACCO DEPENDENCE 11/30/2006  . OSTEOARTHRITIS OF SPINE, NOS 11/30/2006  . INCONTINENCE, URGE 11/30/2006    Past Surgical History:  Procedure Laterality Date  . BONE SPURS     REMOVED FROM SHOULDERS  . EYE SURGERY     STY REMOVED LEFT EYE  1992  . ROTATOR CUFF REPAIR     BILATERAL  . TOTAL HIP ARTHROPLASTY  08/15/2011   Procedure: TOTAL HIP ARTHROPLASTY;  Surgeon: Eulas Post;  Location: [facility];  Service: Orthopedics;  Laterality: Right;  . TOTAL HIP ARTHROPLASTY Left 02/07/2017  . TOTAL HIP ARTHROPLASTY Left 02/07/2017   Procedure: LEFT TOTAL HIP ARTHROPLASTY;  Surgeon: Teryl Lucy, MD;  Location: [facility];  Service: Orthopedics;  Laterality: Left;    OB History   No obstetric history on file.      Home Medications    Prior to Admission medications   Medication Sig Start Date End Date Taking? Authorizing Provider  BLACK CURRANT SEED OIL PO Take 1 capsule by mouth daily.   Yes [provider]  ergocalciferol (DRISDOL) 200 MCG/ML drops Take 4,000 Units by mouth daily.   Yes [provider]  hydrOXYzine (ATARAX/VISTARIL) 10 MG tablet Take 1 tablet (10 mg total) by mouth 3 (three) times daily as needed. 03/18/20  Yes Bland, Scott, DO  Ibuprofen-diphenhydrAMINE HCl 200-25 MG CAPS Take 2 tablets by mouth at bedtime as needed (pain and sleep).   Yes [provider]  Multiple Vitamin (MULTIVITAMIN) capsule Take 1 capsule by mouth daily.  Yes [provider]  Nutritional Supplements (ENSURE COMPLETE SHAKE) LIQD Take 1 Bottle by mouth daily as needed. 10/08/18  Yes Marthenia Rolling, DO  cyclobenzaprine (FLEXERIL) 10 MG tablet Take 1 tablet (10 mg total) by mouth 2 (two) times daily as needed for muscle spasms. 06/06/20   Dahlia Byes A, NP  HYDROcodone-acetaminophen (NORCO/VICODIN) 5-325 MG tablet Take 1 tablet by mouth every 6 (six) hours as needed for moderate pain or severe pain. 06/06/20   Malyiah Fellows, Gloris Manchester A, NP  naproxen (NAPROSYN) 500 MG tablet Take 1 tablet  (500 mg total) by mouth 2 (two) times daily. 06/06/20   Dahlia Byes A, NP  omeprazole (PRILOSEC) 20 MG capsule Take 1 capsule (20 mg total) by mouth daily. Patient taking differently: Take 20 mg by mouth daily as needed (acid reflux).  03/18/20   Marthenia Rolling, DO  sertraline (ZOLOFT) 25 MG tablet Take 1 tablet (25 mg total) by mouth daily. 03/18/20 04/17/20  Marthenia Rolling, DO    Family History Family History  Problem Relation Age of Onset  . Bipolar disorder Sister   . Hypertension Sister   . Hypertension Brother   . Diabetes Brother   . Heart disease Brother   . Hypertension Other   . Hypertension Mother   . Rheum arthritis Mother   . Heart disease Mother   . Hypertension Maternal Aunt   . Cancer Father   . Asthma Daughter   . Asthma Son     Social History Social History   Tobacco Use  . Smoking status: Current Every Day Smoker    Packs/day: 0.50    Years: 20.00    Pack years: 10.00    Types: Cigarettes  . Smokeless tobacco: Former Neurosurgeon    Quit date: 02/06/2017  Vaping Use  . Vaping Use: Never used  Substance Use Topics  . Alcohol use: No  . Drug use: Yes     Allergies   Pregabalin, Tramadol, Gabapentin, and Cymbalta [duloxetine hcl]   Review of Systems Review of Systems   Physical Exam Triage Vital Signs ED Triage Vitals  Enc Vitals Group     BP 06/06/20 1919 131/75     Pulse Rate 06/06/20 1919 70     Resp 06/06/20 1919 16     Temp 06/06/20 1919 98.8 F (37.1 C)     Temp Source 06/06/20 1919 Oral     SpO2 06/06/20 1919 100 %     Weight 06/06/20 1923 171 lb (77.6 kg)     Height --      Head Circumference --      Peak Flow --      Pain Score 06/06/20 1923 10     Pain Loc --      Pain Edu? --      Excl. in GC? --    No data found.  Updated Vital Signs BP 131/75 (BP Location: Left Arm)   Pulse 70   Temp 98.8 F (37.1 C) (Oral)   Resp 16   Wt 171 lb (77.6 kg)   LMP  (LMP Unknown)   SpO2 100%   BMI 32.31 kg/m   Visual Acuity Right Eye  Distance:   Left Eye Distance:   Bilateral Distance:    Right Eye Near:   Left Eye Near:    Bilateral Near:     Physical Exam Vitals and nursing note reviewed.  Constitutional:      General: She is not in acute distress.    Appearance: Normal appearance.  She is not ill-appearing, toxic-appearing or diaphoretic.  HENT:     Head: Normocephalic.     Nose: Nose normal.  Eyes:     Conjunctiva/sclera: Conjunctivae normal.  Pulmonary:     Effort: Pulmonary effort is normal.  Musculoskeletal:        General: Normal range of motion.     Cervical back: Normal range of motion.  Skin:    General: Skin is warm and dry.     Findings: No rash.  Neurological:     Mental Status: She is alert.  Psychiatric:        Mood and Affect: Mood normal.      UC Treatments / Results  Labs (all labs ordered are listed, but only abnormal results are displayed) Labs Reviewed - No data to display  EKG   Radiology No results found.  Procedures Procedures (including critical care time)  Medications Ordered in UC Medications - No data to display  Initial Impression / Assessment and Plan / UC Course  I have reviewed the triage vital signs and the nursing notes.  Pertinent labs & imaging results that were available during my care of the patient were reviewed by me and considered in my medical decision making (see chart for details).     Chronic shoulder pain scheduled for surgery Refill medications to include Flexeril, hydrocodone and naproxen Follow-up with orthopedic as needed Final Clinical Impressions(s) / UC Diagnoses   Final diagnoses:  Chronic left shoulder pain     Discharge Instructions     Refilled your medication.  Follow-up with your orthopedic    ED Prescriptions    Medication Sig Dispense Auth. Provider   cyclobenzaprine (FLEXERIL) 10 MG tablet Take 1 tablet (10 mg total) by mouth 2 (two) times daily as needed for muscle spasms. 20 tablet Tsion Inghram A, NP    HYDROcodone-acetaminophen (NORCO/VICODIN) 5-325 MG tablet Take 1 tablet by mouth every 6 (six) hours as needed for moderate pain or severe pain. 12 tablet Michole Lecuyer A, NP   naproxen (NAPROSYN) 500 MG tablet Take 1 tablet (500 mg total) by mouth 2 (two) times daily. 30 tablet Lonnie Reth A, NP     I have reviewed the PDMP during this encounter.   Janace Aris, NP 06/07/20 1437

## 2020-06-09 ENCOUNTER — Other Ambulatory Visit: Payer: Self-pay

## 2020-06-09 ENCOUNTER — Ambulatory Visit (INDEPENDENT_AMBULATORY_CARE_PROVIDER_SITE_OTHER): Payer: Medicaid Other | Admitting: Psychiatry

## 2020-06-09 DIAGNOSIS — F331 Major depressive disorder, recurrent, moderate: Secondary | ICD-10-CM

## 2020-06-09 DIAGNOSIS — F4310 Post-traumatic stress disorder, unspecified: Secondary | ICD-10-CM

## 2020-06-10 ENCOUNTER — Encounter (HOSPITAL_COMMUNITY): Payer: Self-pay | Admitting: Psychiatry
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
## 2020-06-14 NOTE — Progress Notes (Signed)
Cardiology Office Note:   Date:  06/15/2020  NAME:  Bridget [name]    MRN: [id number] DOB:  [birth year]   PCP:  Sandre Kitty, MD  Cardiologist:  No primary care provider on file.  Electrophysiologist:  None   Referring MD: Sandre Kitty, MD   Chief Complaint  Patient presents with   Loss of Consciousness   History of Present Illness:   Bridget [name] is a 59 y.o. female with a hx of depression who presents for follow-up of syncope. Was seen for syncope a few months ago. Normal echo and normal monitor. Symptoms preceded by dizziness and lightheadedness consistent with vasovagal syncope. I recommended increased hydration. She reports has been doing well.  She has had no further syncopal events.  She is increased her hydration and seems to be doing well.  She continues to walk and maintain a high level of activity without any chest pain, shortness of breath or palpitations.  Overall appears to be doing well.  She is suffering from left neck pain.  She is using a heating pad reports she has been referred to physical therapy.  I do agree with this.  Overall, her cardiovascular work-up has been negative.  Her symptoms are likely related to recurrent vasovagal syncope.  Past Medical History: Past Medical History:  Diagnosis Date   Bipolar II disorder (HCC)    Community acquired pneumonia of right upper lobe of lung 08/14/2017   Depression    Obesity, morbid (HCC)    Osteoarthritis     Past Surgical History: Past Surgical History:  Procedure Laterality Date   BONE SPURS     REMOVED FROM SHOULDERS   EYE SURGERY     STY REMOVED LEFT EYE  1992   ROTATOR CUFF REPAIR     BILATERAL   TOTAL HIP ARTHROPLASTY  08/15/2011   Procedure: TOTAL HIP ARTHROPLASTY;  Surgeon: Eulas Post;  Location: [facility];  Service: Orthopedics;  Laterality: Right;   TOTAL HIP ARTHROPLASTY Left 02/07/2017   TOTAL HIP ARTHROPLASTY Left 02/07/2017   Procedure: LEFT TOTAL HIP ARTHROPLASTY;  Surgeon:  Teryl Lucy, MD;  Location: [facility];  Service: Orthopedics;  Laterality: Left;    Current Medications: Current Meds  Medication Sig   BLACK CURRANT SEED OIL PO Take 1 capsule by mouth daily.   cyclobenzaprine (FLEXERIL) 10 MG tablet Take 1 tablet (10 mg total) by mouth 2 (two) times daily as needed for muscle spasms.   ergocalciferol (DRISDOL) 200 MCG/ML drops Take 4,000 Units by mouth daily.   hydrOXYzine (ATARAX/VISTARIL) 10 MG tablet Take 1 tablet (10 mg total) by mouth 3 (three) times daily as needed.   Ibuprofen-diphenhydrAMINE HCl 200-25 MG CAPS Take 2 tablets by mouth at bedtime as needed (pain and sleep).   MEDROL 4 MG TBPK tablet Take by mouth.   Multiple Vitamin (MULTIVITAMIN) capsule Take 1 capsule by mouth daily.   naproxen (NAPROSYN) 500 MG tablet Take 1 tablet (500 mg total) by mouth 2 (two) times daily.   Nutritional Supplements (ENSURE COMPLETE SHAKE) LIQD Take 1 Bottle by mouth daily as needed.   omeprazole (PRILOSEC) 20 MG capsule Take 1 capsule (20 mg total) by mouth daily. (Patient taking differently: Take 20 mg by mouth daily as needed (acid reflux). )     Allergies:    Pregabalin, Tramadol, Gabapentin, and Cymbalta [duloxetine hcl]   Social History: Social History   Socioeconomic History   Marital status: Single    Spouse name: Not on file  Number of children: 4   Years of education: Not on file   Highest education level: Not on file  Occupational History   Occupation: disabled  Tobacco Use   Smoking status: Current Every Day Smoker    Packs/day: 0.50    Years: 20.00    Pack years: 10.00    Types: Cigarettes   Smokeless tobacco: Former Neurosurgeon    Quit date: 02/06/2017  Vaping Use   Vaping Use: Never used  Substance and Sexual Activity   Alcohol use: No   Drug use: Yes   Sexual activity: Yes  Other Topics Concern   Not on file  Social History Narrative   Not on file   Social Determinants of Health   Financial Resource  Strain:    Difficulty of Paying Living Expenses: Not on file  Food Insecurity:    Worried About Programme researcher, broadcasting/film/video in the Last Year: Not on file   [employer] of Food in the Last Year: Not on file  Transportation Needs:    Lack of Transportation (Medical): Not on file   Lack of Transportation (Non-Medical): Not on file  Physical Activity:    Days of Exercise per Week: Not on file   Minutes of Exercise per Session: Not on file  Stress:    Feeling of Stress : Not on file  Social Connections:    Frequency of Communication with Friends and Family: Not on file   Frequency of Social Gatherings with Friends and Family: Not on file   Attends Religious Services: Not on file   Active Member of Clubs or Organizations: Not on file   Attends Banker Meetings: Not on file   Marital Status: Not on file     Family History: The patient's family history includes Asthma in her daughter and son; Bipolar disorder in her sister; Cancer in her father; Diabetes in her brother; Heart disease in her brother and mother; Hypertension in her brother, maternal aunt, mother, sister, and another family member; Rheum arthritis in her mother.  ROS:   All other ROS reviewed and negative. Pertinent positives noted in the HPI.     EKGs/Labs/Other Studies Reviewed:   The following studies were personally reviewed by me today:  TTE 03/17/2020  1. Left ventricular ejection fraction, by estimation, is 60 to 65%. The  left ventricle has normal function. The left ventricle has no regional  wall motion abnormalities. Left ventricular diastolic parameters were  normal.  2. Right ventricular systolic function is normal. The right ventricular  size is normal. There is normal pulmonary artery systolic pressure. The  estimated right ventricular systolic pressure is 17.6 mmHg.  3. The mitral valve is normal in structure. No evidence of mitral valve  regurgitation. No evidence of mitral stenosis.    4. The aortic valve is normal in structure. Aortic valve regurgitation is  not visualized. No aortic stenosis is present.  5. The inferior vena cava is normal in size with greater than 50%  respiratory variability, suggesting right atrial pressure of 3 mmHg.   Zio 04/23/2020 1. No arrhythmia detected.  2. Rare ectopy.    Recent Labs: 03/06/2020: ALT 9; TSH 0.409 03/26/2020: BUN 9; Creatinine, Ser 0.74; Hemoglobin 12.1; Platelets 329; Potassium 4.3; Sodium 141   Recent Lipid Panel    Component Value Date/Time   CHOL 191 11/01/2016 1629   TRIG 143 11/01/2016 1629   HDL 37 (L) 11/01/2016 1629   CHOLHDL 5.2 (H) 11/01/2016 1629   VLDL 29  11/01/2016 1629   LDLCALC 125 (H) 11/01/2016 1629   LDLDIRECT 176 (H) 11/01/2012 1509    Physical Exam:   VS:  BP 130/76    Pulse 85    Ht 5\' 1"  (1.549 m)    Wt 176 lb (79.8 kg)    LMP  (LMP Unknown)    SpO2 98%    BMI 33.25 kg/m    Wt Readings from Last 3 Encounters:  06/15/20 176 lb (79.8 kg)  06/06/20 171 lb (77.6 kg)  04/02/20 140 lb 3.2 oz (63.6 kg)    General: Well nourished, well developed, in no acute distress Heart: Atraumatic, normal size  Eyes: PEERLA, EOMI  Neck: Supple, no JVD Endocrine: No thryomegaly Cardiac: Normal S1, S2; RRR; no murmurs, rubs, or gallops Lungs: Clear to auscultation bilaterally, no wheezing, rhonchi or rales  Abd: Soft, nontender, no hepatomegaly  Ext: No edema, pulses 2+ Musculoskeletal: No deformities, BUE and BLE strength normal and equal Skin: Warm and dry, no rashes   Neuro: Alert and oriented to person, place, time, and situation, CNII-XII grossly intact, no focal deficits  Psych: Normal mood and affect   ASSESSMENT:   Bridget [name] is a 59 y.o. female who presents for the following: 1. Syncope and collapse   2. Vasovagal syncope     PLAN:   1. Syncope and collapse 2. Vasovagal syncope -She had recurrent syncope.  EKG was normal.  Echo normal.  Holter monitor normal.  Cardiovascular exam  normal.  Clearly this was noncardiac.  I suspect this was recurrent vasovagal syncope.  She had no alarming symptoms and her work-up is been negative.  She can walk several miles without chest pain or shortness of breath.  I recommended no further work-up.  She should maintain adequate hydration and if she feels dizzy or lightheaded she should sit down to circumvent any syncope.  Disposition: Return if symptoms worsen or fail to improve.  Medication Adjustments/Labs and Tests Ordered: Current medicines are reviewed at length with the patient today.  Concerns regarding medicines are outlined above.  No orders of the defined types were placed in this encounter.  No orders of the defined types were placed in this encounter.   Patient Instructions  Medication Instructions:  No Cahnges *If you need a refill on your cardiac medications before your next appointment, please call your pharmacy*   Lab Work: None Indicated If you have labs (blood work) drawn today and your tests are completely normal, you will receive your results only by:  MyChart Message (if you have MyChart) OR  A paper copy in the mail If you have any lab test that is abnormal or we need to change your treatment, we will call you to review the results.   Testing/Procedures: No Testing Ordered   Follow-Up: At [facility], you and your health needs are our priority.  As part of our continuing mission to provide you with exceptional heart care, we have created designated Provider Care Teams.  These Care Teams include your primary Cardiologist (physician) and Advanced Practice Providers (APPs -  Physician Assistants and Nurse Practitioners) who all work together to provide you with the care you need, when you need it.  We recommend signing up for the patient portal called "MyChart".  Sign up information is provided on this After Visit Summary.  MyChart is used to connect with patients for Virtual Visits (Telemedicine).   Patients are able to view lab/test results, encounter notes, upcoming appointments, etc.  Non-urgent messages  can be sent to your provider as well.   To learn more about what you can do with MyChart, go to [email or web address].    Your next appointment:   As Needed  The format for your next appointment:   As Needed  Provider:   Lennie Odor, MD   Other Instructions None    Time Spent with Patient: I have spent a total of 25 minutes with patient reviewing hospital notes, telemetry, EKGs, labs and examining the patient as well as establishing an assessment and plan that was discussed with the patient.  > 50% of time was spent in direct patient care.  Signed, Lenna Gilford. Flora Lipps, MD [facility]  [address], [address] [place], [place] [address] [phone]  06/15/2020 1:17 PM

## 2020-06-15 ENCOUNTER — Encounter: Payer: Self-pay | Admitting: Cardiovascular Disease

## 2020-06-15 ENCOUNTER — Other Ambulatory Visit: Payer: Self-pay

## 2020-06-15 ENCOUNTER — Ambulatory Visit: Payer: Medicaid Other | Admitting: Cardiovascular Disease

## 2020-06-15 VITALS — BP 130/76 | HR 85 | Ht 61.0 in | Wt 176.0 lb

## 2020-06-15 DIAGNOSIS — R55 Syncope and collapse: Secondary | ICD-10-CM

## 2020-06-15 NOTE — Patient Instructions (Signed)
Medication Instructions:  No Cahnges *If you need a refill on your cardiac medications before your next appointment, please call your pharmacy*   Lab Work: None Indicated If you have labs (blood work) drawn today and your tests are completely normal, you will receive your results only by: Marland Kitchen MyChart Message (if you have MyChart) OR . A paper copy in the mail If you have any lab test that is abnormal or we need to change your treatment, we will call you to review the results.   Testing/Procedures: No Testing Ordered   Follow-Up: At [facility], you and your health needs are our priority.  As part of our continuing mission to provide you with exceptional heart care, we have created designated Provider Care Teams.  These Care Teams include your primary Cardiologist (physician) and Advanced Practice Providers (APPs -  Physician Assistants and Nurse Practitioners) who all work together to provide you with the care you need, when you need it.  We recommend signing up for the patient portal called "MyChart".  Sign up information is provided on this After Visit Summary.  MyChart is used to connect with patients for Virtual Visits (Telemedicine).  Patients are able to view lab/test results, encounter notes, upcoming appointments, etc.  Non-urgent messages can be sent to your provider as well.   To learn more about what you can do with MyChart, go to [email or web address].    Your next appointment:   As Needed  The format for your next appointment:   As Needed  Provider:   Lennie Odor, MD   Other Instructions None

## 2020-06-16 ENCOUNTER — Ambulatory Visit (INDEPENDENT_AMBULATORY_CARE_PROVIDER_SITE_OTHER): Payer: Medicaid Other | Admitting: Psychiatry

## 2020-06-16 DIAGNOSIS — F4310 Post-traumatic stress disorder, unspecified: Secondary | ICD-10-CM

## 2020-06-16 DIAGNOSIS — F331 Major depressive disorder, recurrent, moderate: Secondary | ICD-10-CM | POA: Diagnosis not present

## 2020-06-23 ENCOUNTER — Encounter (HOSPITAL_COMMUNITY): Payer: Self-pay | Admitting: Psychiatry

## 2020-06-23 ENCOUNTER — Other Ambulatory Visit: Payer: Self-pay

## 2020-06-23 ENCOUNTER — Ambulatory Visit (HOSPITAL_COMMUNITY): Payer: Medicaid Other | Admitting: Psychiatry

## 2020-06-23 NOTE — Progress Notes (Signed)
I connected withAlice F Thompsonon 38/9/37DS 2:30 PM EDTby in person in the office.  Location: Patient:OFFICE Provider:OFFICE  History of Present Illness: MDD and PTSD  Treatment Plan Goals: 1) Bridget [name] would like to process grief and loss experiences to decrease depressive symptoms. 2) Bridget [name] would like to process and heal from trauma experiences to decrease instances of trauma triggers/reactions with impact her daily functioning and fulfillment in relationships.  Observations/Objective: Counselor met with Bridget [name] for individual Bridget [name] person in the office. Counselor assessed MH symptoms and progress on treatment plan goals, with patient reportingthat the pain she is feeling in her body is making it difficult to actively address the relational and mental health issues she is experiencing. Bridget [name] discussed communication with providers and recommendations to alleviate the pain until her surgeries. Bridget [name] discussed the struggle with emotional health. Bridget [name] is seeking a way to find balance and to reinforce boundaries for her wellness. Bridget [name] presents withmoderatedepression and moderateanxiety. Bridget [name] denied suicidal ideation or self-harm behaviors.   Counselor first addressed Goal 1 with Bridget [name] giving updates on stressors related to her adult children and their decision making skills, causing her to get involved in their lives in ways she is not comfortable. Counselor and Bridget [name] discussed boundary setting and identified specific ways she can respond to her children when feeling uncomfortable or pressured.   In addressing Goal 2,with Bridget [name] recalling times when she has had SI and how she wants to enjoy life without the trauma triggers and reminders. Bridget [name] having negative cognitions due to pain in body and life circumstances. Counselor provided psychoedcuation on trauma and coping skills to apply such as relaxation, grounding and mindfulness, in addition to ongoing attempts to set  boundaries.    Assessment and Plan: Counselor will continue to meet with patient to address treatment plan goals. Patient will continue to follow recommendations of providers and implement skills learned in session.  Follow Up Instructions: Counselor will send information for next session via Webex.   The patient was advised to call back or seek an in-person evaluation if the symptoms worsen or if the condition fails to improve as anticipated.  I provided 60 minutes of non-face-to-face time during this encounter.   Bridget Auer, LCSW

## 2020-06-24 ENCOUNTER — Encounter (HOSPITAL_BASED_OUTPATIENT_CLINIC_OR_DEPARTMENT_OTHER): Payer: Self-pay | Admitting: Orthopedic Surgery

## 2020-06-24 ENCOUNTER — Other Ambulatory Visit: Payer: Self-pay

## 2020-06-26 ENCOUNTER — Other Ambulatory Visit (HOSPITAL_COMMUNITY)
Admission: RE | Admit: 2020-06-26 | Discharge: 2020-06-26 | Disposition: A | Discharge status: Home / Self Care | Payer: Medicaid Other | Source: Ambulatory Visit | Attending: Orthopedic Surgery | Admitting: Orthopedic Surgery

## 2020-06-26 DIAGNOSIS — Z20822 Contact with and (suspected) exposure to covid-19: Secondary | ICD-10-CM | POA: Diagnosis not present

## 2020-06-26 DIAGNOSIS — Z01812 Encounter for preprocedural laboratory examination: Secondary | ICD-10-CM | POA: Diagnosis not present

## 2020-06-26 LAB — SARS CORONAVIRUS 2 (TAT 6-24 HRS): SARS Coronavirus 2: NEGATIVE

## 2020-06-30 ENCOUNTER — Encounter (HOSPITAL_BASED_OUTPATIENT_CLINIC_OR_DEPARTMENT_OTHER): Payer: Self-pay | Admitting: Orthopedic Surgery

## 2020-06-30 ENCOUNTER — Other Ambulatory Visit: Payer: Self-pay

## 2020-06-30 ENCOUNTER — Ambulatory Visit (HOSPITAL_BASED_OUTPATIENT_CLINIC_OR_DEPARTMENT_OTHER)
Admission: RE | Admit: 2020-06-30 | Discharge: 2020-06-30 | Disposition: A | Discharge status: Home / Self Care | Payer: Medicaid Other | Attending: Orthopedic Surgery | Admitting: Orthopedic Surgery

## 2020-06-30 ENCOUNTER — Ambulatory Visit (HOSPITAL_BASED_OUTPATIENT_CLINIC_OR_DEPARTMENT_OTHER): Payer: Medicaid Other | Admitting: Registered Nurse

## 2020-06-30 ENCOUNTER — Ambulatory Visit (HOSPITAL_COMMUNITY): Payer: Medicaid Other | Admitting: Psychiatry

## 2020-06-30 ENCOUNTER — Encounter (HOSPITAL_BASED_OUTPATIENT_CLINIC_OR_DEPARTMENT_OTHER)
Admission: RE | Disposition: A | Discharge status: Home / Self Care | Payer: Self-pay | Source: Home / Self Care | Attending: Orthopedic Surgery

## 2020-06-30 DIAGNOSIS — Z825 Family history of asthma and other chronic lower respiratory diseases: Secondary | ICD-10-CM | POA: Insufficient documentation

## 2020-06-30 DIAGNOSIS — Z809 Family history of malignant neoplasm, unspecified: Secondary | ICD-10-CM | POA: Insufficient documentation

## 2020-06-30 DIAGNOSIS — Z818 Family history of other mental and behavioral disorders: Secondary | ICD-10-CM | POA: Insufficient documentation

## 2020-06-30 DIAGNOSIS — Z79899 Other long term (current) drug therapy: Secondary | ICD-10-CM | POA: Insufficient documentation

## 2020-06-30 DIAGNOSIS — F419 Anxiety disorder, unspecified: Secondary | ICD-10-CM | POA: Insufficient documentation

## 2020-06-30 DIAGNOSIS — K219 Gastro-esophageal reflux disease without esophagitis: Secondary | ICD-10-CM | POA: Diagnosis not present

## 2020-06-30 DIAGNOSIS — M797 Fibromyalgia: Secondary | ICD-10-CM | POA: Insufficient documentation

## 2020-06-30 DIAGNOSIS — M7542 Impingement syndrome of left shoulder: Secondary | ICD-10-CM | POA: Diagnosis not present

## 2020-06-30 DIAGNOSIS — Z96642 Presence of left artificial hip joint: Secondary | ICD-10-CM | POA: Insufficient documentation

## 2020-06-30 DIAGNOSIS — Z888 Allergy status to other drugs, medicaments and biological substances status: Secondary | ICD-10-CM | POA: Insufficient documentation

## 2020-06-30 DIAGNOSIS — Z885 Allergy status to narcotic agent status: Secondary | ICD-10-CM | POA: Insufficient documentation

## 2020-06-30 DIAGNOSIS — Z791 Long term (current) use of non-steroidal anti-inflammatories (NSAID): Secondary | ICD-10-CM | POA: Diagnosis not present

## 2020-06-30 DIAGNOSIS — M7582 Other shoulder lesions, left shoulder: Secondary | ICD-10-CM | POA: Insufficient documentation

## 2020-06-30 DIAGNOSIS — M19012 Primary osteoarthritis, left shoulder: Secondary | ICD-10-CM | POA: Insufficient documentation

## 2020-06-30 DIAGNOSIS — F3181 Bipolar II disorder: Secondary | ICD-10-CM | POA: Diagnosis not present

## 2020-06-30 DIAGNOSIS — M75102 Unspecified rotator cuff tear or rupture of left shoulder, not specified as traumatic: Secondary | ICD-10-CM | POA: Diagnosis present

## 2020-06-30 DIAGNOSIS — E039 Hypothyroidism, unspecified: Secondary | ICD-10-CM | POA: Insufficient documentation

## 2020-06-30 DIAGNOSIS — Z833 Family history of diabetes mellitus: Secondary | ICD-10-CM | POA: Diagnosis not present

## 2020-06-30 DIAGNOSIS — M75112 Incomplete rotator cuff tear or rupture of left shoulder, not specified as traumatic: Secondary | ICD-10-CM | POA: Insufficient documentation

## 2020-06-30 DIAGNOSIS — Z8261 Family history of arthritis: Secondary | ICD-10-CM | POA: Diagnosis not present

## 2020-06-30 DIAGNOSIS — F17210 Nicotine dependence, cigarettes, uncomplicated: Secondary | ICD-10-CM | POA: Diagnosis not present

## 2020-06-30 DIAGNOSIS — Z8249 Family history of ischemic heart disease and other diseases of the circulatory system: Secondary | ICD-10-CM | POA: Insufficient documentation

## 2020-06-30 DIAGNOSIS — Z6834 Body mass index (BMI) 34.0-34.9, adult: Secondary | ICD-10-CM | POA: Diagnosis not present

## 2020-06-30 HISTORY — PX: BICEPT TENODESIS: SHX5116

## 2020-06-30 HISTORY — DX: Syncope and collapse: R55

## 2020-06-30 HISTORY — PX: SHOULDER ARTHROSCOPY WITH DISTAL CLAVICLE RESECTION: SHX5675

## 2020-06-30 HISTORY — PX: SHOULDER ARTHROSCOPY WITH ROTATOR CUFF REPAIR AND SUBACROMIAL DECOMPRESSION: SHX5686

## 2020-06-30 HISTORY — DX: Gastro-esophageal reflux disease without esophagitis: K21.9

## 2020-06-30 SURGERY — SHOULDER ARTHROSCOPY WITH ROTATOR CUFF REPAIR AND SUBACROMIAL DECOMPRESSION
Anesthesia: General | Site: Shoulder | Laterality: Left

## 2020-06-30 MED ORDER — AMISULPRIDE (ANTIEMETIC) 5 MG/2ML IV SOLN: 10.0000 mg | Freq: Once | INTRAVENOUS | Status: DC | PRN

## 2020-06-30 MED ORDER — HYDROMORPHONE HCL 1 MG/ML IJ SOLN: 0.2500 mg | INTRAMUSCULAR | Status: DC | PRN

## 2020-06-30 MED ORDER — LIDOCAINE 2% (20 MG/ML) 5 ML SYRINGE
INTRAMUSCULAR | Status: DC | PRN
  Administered 2020-06-30: 60 mg via INTRAVENOUS

## 2020-06-30 MED ORDER — ACETAMINOPHEN 500 MG PO TABS
ORAL_TABLET | ORAL | Status: AC
  Filled 2020-06-30: qty 2

## 2020-06-30 MED ORDER — POVIDONE-IODINE 10 % EX SWAB
2.0000 "application " | Freq: Once | CUTANEOUS | Status: AC
  Administered 2020-06-30: 2 via TOPICAL

## 2020-06-30 MED ORDER — LACTATED RINGERS IV SOLN: INTRAVENOUS | Status: DC

## 2020-06-30 MED ORDER — MIDAZOLAM HCL 2 MG/2ML IJ SOLN
INTRAMUSCULAR | Status: AC
  Filled 2020-06-30: qty 2

## 2020-06-30 MED ORDER — FENTANYL CITRATE (PF) 100 MCG/2ML IJ SOLN
INTRAMUSCULAR | Status: AC
  Filled 2020-06-30: qty 2

## 2020-06-30 MED ORDER — FENTANYL CITRATE (PF) 100 MCG/2ML IJ SOLN
100.0000 ug | Freq: Once | INTRAMUSCULAR | Status: AC
  Administered 2020-06-30: 100 ug via INTRAVENOUS

## 2020-06-30 MED ORDER — DEXAMETHASONE SODIUM PHOSPHATE 10 MG/ML IJ SOLN
INTRAMUSCULAR | Status: DC | PRN
  Administered 2020-06-30: 10 mg via INTRAVENOUS

## 2020-06-30 MED ORDER — ACETAMINOPHEN 500 MG PO TABS
1000.0000 mg | ORAL_TABLET | Freq: Once | ORAL | Status: AC
  Administered 2020-06-30: 1000 mg via ORAL

## 2020-06-30 MED ORDER — PROPOFOL 10 MG/ML IV BOLUS
INTRAVENOUS | Status: DC | PRN
  Administered 2020-06-30: 200 mg via INTRAVENOUS

## 2020-06-30 MED ORDER — BUPIVACAINE LIPOSOME 1.3 % IJ SUSP
INTRAMUSCULAR | Status: DC | PRN
  Administered 2020-06-30: 10 mL via PERINEURAL

## 2020-06-30 MED ORDER — PROMETHAZINE HCL 25 MG/ML IJ SOLN: 6.2500 mg | INTRAMUSCULAR | Status: DC | PRN

## 2020-06-30 MED ORDER — ONDANSETRON HCL 4 MG/2ML IJ SOLN
INTRAMUSCULAR | Status: DC | PRN
  Administered 2020-06-30: 4 mg via INTRAVENOUS

## 2020-06-30 MED ORDER — OXYCODONE HCL 5 MG/5ML PO SOLN: 5.0000 mg | Freq: Once | ORAL | Status: DC | PRN

## 2020-06-30 MED ORDER — PROPOFOL 500 MG/50ML IV EMUL
INTRAVENOUS | Status: AC
  Filled 2020-06-30: qty 50

## 2020-06-30 MED ORDER — OXYCODONE HCL 5 MG PO TABS: 5.0000 mg | ORAL_TABLET | Freq: Once | ORAL | Status: DC | PRN

## 2020-06-30 MED ORDER — BUPIVACAINE HCL (PF) 0.5 % IJ SOLN
INTRAMUSCULAR | Status: DC | PRN
  Administered 2020-06-30: 20 mL via PERINEURAL

## 2020-06-30 MED ORDER — FENTANYL CITRATE (PF) 250 MCG/5ML IJ SOLN
INTRAMUSCULAR | Status: DC | PRN
Start: 2020-06-30 — End: 2020-06-30
  Administered 2020-06-30 (×2): 25 ug via INTRAVENOUS
  Administered 2020-06-30: 50 ug via INTRAVENOUS

## 2020-06-30 MED ORDER — SODIUM CHLORIDE 0.9 % IR SOLN
Status: DC | PRN
  Administered 2020-06-30: 19500 mL

## 2020-06-30 MED ORDER — PHENYLEPHRINE HCL (PRESSORS) 10 MG/ML IV SOLN
INTRAVENOUS | Status: DC | PRN
  Administered 2020-06-30: 120 ug via INTRAVENOUS

## 2020-06-30 MED ORDER — CEFAZOLIN SODIUM-DEXTROSE 2-4 GM/100ML-% IV SOLN
2.0000 g | INTRAVENOUS | Status: AC
  Administered 2020-06-30: 2 g via INTRAVENOUS

## 2020-06-30 MED ORDER — MEPERIDINE HCL 25 MG/ML IJ SOLN: 6.2500 mg | INTRAMUSCULAR | Status: DC | PRN

## 2020-06-30 MED ORDER — MIDAZOLAM HCL 2 MG/2ML IJ SOLN
2.0000 mg | Freq: Once | INTRAMUSCULAR | Status: AC
  Administered 2020-06-30: 2 mg via INTRAVENOUS

## 2020-06-30 MED ORDER — CEFAZOLIN SODIUM-DEXTROSE 2-4 GM/100ML-% IV SOLN
INTRAVENOUS | Status: AC
  Filled 2020-06-30: qty 100

## 2020-06-30 MED ORDER — OXYCODONE HCL 5 MG PO TABS: 5.0000 mg | ORAL_TABLET | ORAL | 0 refills | Status: AC | PRN

## 2020-06-30 SURGICAL SUPPLY — 86 items
ANCH SUT SWLK 19.1X4.75 (Anchor) ×2 IMPLANT
ANCHOR SUT 1.8 FBRTK KNTLS 2SU (Anchor) ×6 IMPLANT
ANCHOR SUT BIO SW 4.75X19.1 (Anchor) ×3 IMPLANT
BLADE EXCALIBUR 4.0MM X 13CM (MISCELLANEOUS)
BLADE EXCALIBUR 4.0X13 (MISCELLANEOUS) IMPLANT
BLADE SURG 15 STRL LF DISP TIS (BLADE) IMPLANT
BLADE SURG 15 STRL SS (BLADE)
BURR OVAL 8 FLU 5.0MM X 13CM (MISCELLANEOUS) ×1
BURR OVAL 8 FLU 5.0X13 (MISCELLANEOUS) ×2 IMPLANT
CANNULA 5.75X71 LONG (CANNULA) ×1 IMPLANT
CANNULA TWIST IN 8.25X7CM (CANNULA) ×6 IMPLANT
CLOSURE STERI-STRIP 1/2X4 (GAUZE/BANDAGES/DRESSINGS) ×1
CLSR STERI-STRIP ANTIMIC 1/2X4 (GAUZE/BANDAGES/DRESSINGS) ×3 IMPLANT
COOLER ICEMAN CLASSIC (MISCELLANEOUS) ×3 IMPLANT
COVER WAND RF STERILE (DRAPES) IMPLANT
DECANTER SPIKE VIAL GLASS SM (MISCELLANEOUS) IMPLANT
DISSECTOR  3.8MM X 13CM (MISCELLANEOUS) ×4
DISSECTOR 3.8MM X 13CM (MISCELLANEOUS) ×2 IMPLANT
DISSECTOR 4.0MM X 13CM (MISCELLANEOUS) IMPLANT
DRAPE IMP U-DRAPE 54X76 (DRAPES) ×4 IMPLANT
DRAPE INCISE IOBAN 66X45 STRL (DRAPES) ×4 IMPLANT
DRAPE SHOULDER BEACH CHAIR (DRAPES) ×4 IMPLANT
DRAPE U-SHAPE 47X51 STRL (DRAPES) ×4 IMPLANT
DRSG PAD ABDOMINAL 8X10 ST (GAUZE/BANDAGES/DRESSINGS) ×4 IMPLANT
DURAPREP 26ML APPLICATOR (WOUND CARE) ×4 IMPLANT
ELECT REM PT RETURN 9FT ADLT (ELECTROSURGICAL)
ELECTRODE REM PT RTRN 9FT ADLT (ELECTROSURGICAL) IMPLANT
FIBERSTICK 2 (SUTURE) IMPLANT
GAUZE SPONGE 4X4 12PLY STRL (GAUZE/BANDAGES/DRESSINGS) ×4 IMPLANT
GLOVE BIO SURGEON STRL SZ7 (GLOVE) ×4 IMPLANT
GLOVE BIOGEL M 6.5 STRL (GLOVE) ×3 IMPLANT
GLOVE BIOGEL PI IND STRL 6.5 (GLOVE) ×1 IMPLANT
GLOVE BIOGEL PI IND STRL 7.0 (GLOVE) ×3 IMPLANT
GLOVE BIOGEL PI IND STRL 8 (GLOVE) ×4 IMPLANT
GLOVE BIOGEL PI INDICATOR 6.5 (GLOVE) ×2
GLOVE BIOGEL PI INDICATOR 7.0 (GLOVE) ×4
GLOVE BIOGEL PI INDICATOR 8 (GLOVE) ×4
GLOVE ORTHO TXT STRL SZ7.5 (GLOVE) ×4 IMPLANT
GOWN STRL REUS W/ TWL LRG LVL3 (GOWN DISPOSABLE) ×3 IMPLANT
GOWN STRL REUS W/ TWL XL LVL3 (GOWN DISPOSABLE) ×3 IMPLANT
GOWN STRL REUS W/TWL LRG LVL3 (GOWN DISPOSABLE) ×8
GOWN STRL REUS W/TWL XL LVL3 (GOWN DISPOSABLE) ×4
IMMOBILIZER SHOULDER FOAM XLGE (SOFTGOODS) IMPLANT
IV NS IRRIG 3000ML ARTHROMATIC (IV SOLUTION) ×21 IMPLANT
KIT BIO-TENODESIS 3X8 DISP (MISCELLANEOUS)
KIT INSRT BABSR STRL DISP BTN (MISCELLANEOUS) IMPLANT
KIT PUSHLOCK 2.9 HIP (KITS) IMPLANT
KIT STR SPEAR 1.8 FBRTK DISP (KITS) ×3 IMPLANT
LASSO 90 CVE QUICKPAS (DISPOSABLE) IMPLANT
MANIFOLD NEPTUNE II (INSTRUMENTS) ×4 IMPLANT
NDL SCORPION MULTI FIRE (NEEDLE) IMPLANT
NDL SUT 6 .5 CRC .975X.05 MAYO (NEEDLE) IMPLANT
NEEDLE MAYO TAPER (NEEDLE)
NEEDLE SCORPION MULTI FIRE (NEEDLE) IMPLANT
PACK ARTHROSCOPY DSU (CUSTOM PROCEDURE TRAY) ×4 IMPLANT
PACK BASIN DAY SURGERY FS (CUSTOM PROCEDURE TRAY) ×4 IMPLANT
PAD COLD SHLDR WRAP-ON (PAD) ×3 IMPLANT
PAD ORTHO SHOULDER 7X19 LRG (SOFTGOODS) ×3 IMPLANT
PENCIL SMOKE EVACUATOR (MISCELLANEOUS) ×1 IMPLANT
PORT APPOLLO RF 90DEGREE MULTI (SURGICAL WAND) ×4 IMPLANT
SHEET MEDIUM DRAPE 40X70 STRL (DRAPES) ×4 IMPLANT
SLEEVE SCD COMPRESS KNEE MED (MISCELLANEOUS) ×4 IMPLANT
SLING ARM FOAM STRAP LRG (SOFTGOODS) ×3 IMPLANT
SPONGE LAP 4X18 RFD (DISPOSABLE) ×1 IMPLANT
SUCTION FRAZIER HANDLE 10FR (MISCELLANEOUS)
SUCTION TUBE FRAZIER 10FR DISP (MISCELLANEOUS) ×1 IMPLANT
SUPPORT WRAP ARM LG (MISCELLANEOUS) ×1 IMPLANT
SUT FIBERWIRE #2 38 T-5 BLUE (SUTURE)
SUT MNCRL AB 4-0 PS2 18 (SUTURE) ×4 IMPLANT
SUT PDS AB 1 CT  36 (SUTURE) ×4
SUT PDS AB 1 CT 36 (SUTURE) ×1 IMPLANT
SUT TIGER TAPE 7 IN WHITE (SUTURE) IMPLANT
SUT VIC AB 3-0 SH 27 (SUTURE)
SUT VIC AB 3-0 SH 27X BRD (SUTURE) IMPLANT
SUT VICRYL 3-0 CR8 SH (SUTURE) ×4 IMPLANT
SUTURE FIBERWR #2 38 T-5 BLUE (SUTURE) IMPLANT
SUTURE TAPE 1.3 40 TPR END (SUTURE) IMPLANT
SUTURE TAPE TIGERLINK 1.3MM BL (SUTURE) ×1 IMPLANT
SUTURETAPE 1.3 40 TPR END (SUTURE)
SUTURETAPE TIGERLINK 1.3MM BL (SUTURE) ×4
TAPE FIBER 2MM 7IN #2 BLUE (SUTURE) ×3 IMPLANT
TOWEL GREEN STERILE FF (TOWEL DISPOSABLE) ×7 IMPLANT
TUBE CONNECTING 20'X1/4 (TUBING) ×1
TUBE CONNECTING 20X1/4 (TUBING) ×2 IMPLANT
TUBING ARTHROSCOPY IRRIG 16FT (MISCELLANEOUS) ×4 IMPLANT
WATER STERILE IRR 1000ML POUR (IV SOLUTION) ×1 IMPLANT

## 2020-06-30 NOTE — Anesthesia Postprocedure Evaluation (Signed)
Anesthesia Post Note  Patient: Bridget [name]  Procedure(s) Performed: LEFT SHOULDER ARTHROSCOPY DEBRIDEMENT, ACROMIOPLASTY, DISTAL CLAVICLE EXCISION,ROTATOR CUFF REPAIR, BICEPS TENODESIS (Left Shoulder) BICEPS TENODESIS (Left Shoulder) SHOULDER ARTHROSCOPY WITH DISTAL CLAVICLE RESECTION (Left Shoulder)     Patient location during evaluation: PACU Anesthesia Type: General Level of consciousness: awake and alert Pain management: pain level controlled Vital Signs Assessment: post-procedure vital signs reviewed and stable Respiratory status: spontaneous breathing, nonlabored ventilation and respiratory function stable Cardiovascular status: blood pressure returned to baseline and stable Postop Assessment: no apparent nausea or vomiting Anesthetic complications: no   No complications documented.  Last Vitals:  Vitals:   06/30/20 1030 06/30/20 1045  BP: 121/74 138/86  Pulse: 80 73  Resp: 18 16  Temp:  (!) 36.3 C  SpO2: 98% 96%    Last Pain:  Vitals:   06/30/20 1045  TempSrc:   PainSc: 0-No pain                 Lowella Curb

## 2020-06-30 NOTE — Progress Notes (Signed)
Assisted Dr. Miller with left, ultrasound guided, interscalene  block. Side rails up, monitors on throughout procedure. See vital signs in flow sheet. Tolerated Procedure well.  

## 2020-06-30 NOTE — Addendum Note (Signed)
Addendum  created 06/30/20 1424 by Adlean Hardeman, Jewel Baize, CRNA   Charge Capture section accepted

## 2020-06-30 NOTE — Anesthesia Preprocedure Evaluation (Signed)
Anesthesia Evaluation  Patient identified by MRN, date of birth, ID band Patient awake    History of Anesthesia Complications Negative for: history of anesthetic complications  Airway Mallampati: II  TM Distance: >3 FB Neck ROM: Full    Dental no notable dental hx. (+) Teeth Intact   Pulmonary Current Smoker and Patient abstained from smoking.,    Pulmonary exam normal breath sounds clear to auscultation       Cardiovascular negative cardio ROS Normal cardiovascular exam Rhythm:Regular Rate:Normal     Neuro/Psych Anxiety Depression Bipolar Disorder    GI/Hepatic Neg liver ROS, GERD  ,  Endo/Other  Hypothyroidism   Renal/GU negative Renal ROS     Musculoskeletal  (+) Arthritis , Osteoarthritis,  Fibromyalgia -  Abdominal (+) + obese,   Peds  Hematology   Anesthesia Other Findings   Reproductive/Obstetrics                             Anesthesia Physical  Anesthesia Plan  ASA: II  Anesthesia Plan: General   Post-op Pain Management:  Regional for Post-op pain   Induction: Intravenous  PONV Risk Score and Plan: 2 and Ondansetron, Midazolam and Treatment may vary due to age or medical condition  Airway Management Planned: LMA  Additional Equipment:   Intra-op Plan:   Post-operative Plan: Extubation in OR  Informed Consent: I have reviewed the patients History and Physical, chart, labs and discussed the procedure including the risks, benefits and alternatives for the proposed anesthesia with the patient or authorized representative who has indicated his/her understanding and acceptance.     Dental advisory given  Plan Discussed with: CRNA  Anesthesia Plan Comments:         Anesthesia Quick Evaluation

## 2020-06-30 NOTE — Transfer of Care (Signed)
Immediate Anesthesia Transfer of Care Note  Patient: Bridget [name]  Procedure(s) Performed: LEFT SHOULDER ARTHROSCOPY DEBRIDEMENT, ACROMIOPLASTY, DISTAL CLAVICLE EXCISION,ROTATOR CUFF REPAIR, BICEPS TENODESIS (Left Shoulder) BICEPS TENODESIS (Left Shoulder) SHOULDER ARTHROSCOPY WITH DISTAL CLAVICLE RESECTION (Left Shoulder)  Patient Location: PACU  Anesthesia Type:General  Level of Consciousness: sedated  Airway & Oxygen Therapy: oxygen via CFM  Post-op Assessment: Report given to RN and Post -op Vital signs reviewed and stable  Post vital signs: Reviewed and stable  Last Vitals:  Vitals Value Taken Time  BP 140/88 06/30/20 0957  Temp    Pulse 83 06/30/20 0958  Resp 14 06/30/20 0958  SpO2 100 % 06/30/20 0958  Vitals shown include unvalidated device data.  Last Pain:  Vitals:   06/30/20 0647  TempSrc: Oral  PainSc: 5       Patients Stated Pain Goal: 4 (06/30/20 2683)  Complications: No complications documented.

## 2020-06-30 NOTE — Op Note (Signed)
06/30/2020  9:53 AM  PATIENT:  Bridget [name]    PRE-OPERATIVE DIAGNOSIS:    Left shoulder supraspinatus tear, impingement syndrome, AC joint arthrosis, superior labral fraying, possible biceps tendon tear  POST-OPERATIVE DIAGNOSIS:    Left shoulder superior supraspinatus tear, high-grade, near full-thickness, with impingement syndrome, AC joint arthrosis, biceps tendon greater than 50% torn  PROCEDURE: Left shoulder arthroscopy with extensive debridement, biceps tenodesis, acromioplasty, distal clavicle resection, and supraspinatus single anchor repair  SURGEON:  Eulas Post, MD  PHYSICIAN ASSISTANT: Janine Ores, PA-C, present and scrubbed throughout the case, critical for completion in a timely fashion, and for retraction, instrumentation, and closure.  ANESTHESIA:   General with regional block  PREOPERATIVE INDICATIONS:  SARIA HARAN is a  59 y.o. female with significant shoulder pain who failed conservative measures and elected for surgical management.    The risks benefits and alternatives were discussed with the patient preoperatively including but not limited to the risks of infection, bleeding, nerve injury, cardiopulmonary complications, the need for revision surgery, recurrent rotator cuff tear, stiffness, incomplete relief of pain, Popeye sign, among others, and the patient was willing to proceed.  ESTIMATED BLOOD LOSS: Minimal  OPERATIVE IMPLANTS: Arthrex Biocomposite SwiveLock 4.75 x 1 for the supraspinatus, and a total of 2 suture tacks for the biceps tendon.  OPERATIVE FINDINGS: High-grade near full-thickness leading edge supraspinatus tear with impingement syndrome, subacromial bursitis, AC joint arthrosis, and greater than 50% tearing of the biceps tendon.  The glenohumeral articular cartilage was in good condition.  The shoulder had full motion during examination under anesthesia.  The superior labrum and anterior labrum had some fraying.  The anterior  inferior glenohumeral ligament was intact.  Bone quality was mediocre at best.  The punch delivered down very easily through the tuberosity, well past the marked line on the punch.  OPERATIVE PROCEDURE: The patient was brought to the operating room and placed in the supine position.  General anesthesia was administered.  IV antibiotics were given.  She was placed into the beachchair position and the left upper extremity prepped and draped in the usual sterile fashion. Timeout performed.  Diagnostic arthroscopy was carried out with the above named findings.    The glenoid labrum was debrided anteriorly and superiorly.    I debrided the undersurface of the cuff as well as prepared the medial edge of the tuberosity for reimplantation using the shaver.  There was not too much exposed footprint from below.  I tagged the biceps tendon with a PDS suture, and then released the tendon using a arthroscopic scissors.  The subscapularis was also evaluated and found to be intact.  The infraspinatus was intact.  I went to the subacromial space, performed a complete bursectomy, subacromial CA ligament release, with a acromioplasty.  I evaluated the tear from viewing laterally, used the shaver from posterior laterally, debrided the tear as well as the bony footprint, and prepared the tendon for reinsertion.  I placed a single fiber tape along with a cinch posteriorly to distribute the stress, and brought this into an anchor placed laterally.  Excellent fixation and reduction of the tendon was achieved.  I touched up the acromioplasty viewing from lateral portal.  I moved my attention to the distal clavicle, resected the soft tissue, and removed approximately 10 mm of bone, and confirmed appropriate distal clavicleectomy from the anterior and lateral portal.  I then viewed from the lateral side, exposed my biceps tendon, and then placed a suture tack into the  bicipital groove, securing the biceps distally, placed the  second 1 proximally.  I trimmed the remaining stump of the biceps, and remove this.  Excellent tenodesis and tension was achieved.  The instruments were removed, the portals closed with Monocryl followed by Steri-Strips and sterile gauze.  The patient was awakened and returned to the PACU in stable and satisfactory condition.  There were no complications and She tolerated the procedure well.

## 2020-06-30 NOTE — Discharge Instructions (Signed)
Post Anesthesia Home Care Instructions  Activity: Get plenty of rest for the remainder of the day. A responsible individual must stay with you for 24 hours following the procedure.  For the next 24 hours, DO NOT: -Drive a car -Advertising copywriter -Drink alcoholic beverages -Take any medication unless instructed by your physician -Make any legal decisions or sign important papers.  Meals: Start with liquid foods such as gelatin or soup. Progress to regular foods as tolerated. Avoid greasy, spicy, heavy foods. If nausea and/or vomiting occur, drink only clear liquids until the nausea and/or vomiting subsides. Call your physician if vomiting continues.  Special Instructions/Symptoms: Your throat may feel dry or sore from the anesthesia or the breathing tube placed in your throat during surgery. If this causes discomfort, gargle with warm salt water. The discomfort should disappear within 24 hours.  If you had a scopolamine patch placed behind your ear for the management of post- operative nausea and/or vomiting:  1. The medication in the patch is effective for 72 hours, after which it should be removed.  Wrap patch in a tissue and discard in the trash. Wash hands thoroughly with soap and water. 2. You may remove the patch earlier than 72 hours if you experience unpleasant side effects which may include dry mouth, dizziness or visual disturbances. 3. Avoid touching the patch. Wash your hands with soap and water after contact with the patch.  Regional Anesthesia Blocks  1. Numbness or the inability to move the "blocked" extremity may last from 3-48 hours after placement. The length of time depends on the medication injected and your individual response to the medication. If the numbness is not going away after 48 hours, call your surgeon.  2. The extremity that is blocked will need to be protected until the numbness is gone and the  Strength has returned. Because you cannot feel it, you will  need to take extra care to avoid injury. Because it may be weak, you may have difficulty moving it or using it. You may not know what position it is in without looking at it while the block is in effect.  3. For blocks in the legs and feet, returning to weight bearing and walking needs to be done carefully. You will need to wait until the numbness is entirely gone and the strength has returned. You should be able to move your leg and foot normally before you try and bear weight or walk. You will need someone to be with you when you first try to ensure you do not fall and possibly risk injury.  4. Bruising and tenderness at the needle site are common side effects and will resolve in a few days.  5. Persistent numbness or new problems with movement should be communicated to the surgeon or the [facility] [phone] [facility] [phone]).     Diet: As you were doing prior to hospitalization   Shower:  May shower but keep the wounds dry, use an occlusive plastic wrap, NO SOAKING IN TUB.  If the bandage gets wet, change with a clean dry gauze.    Dressing:  You may change your dressing 3-5 days after surgery, unless you have a splint.  If you have a splint, then just leave the splint in place and we will change your bandages during your first follow-up appointment.    If you had hand or foot surgery, we will plan to remove your stitches in about 2 weeks in the office.  For all other surgeries, there are sticky tapes (steri-strips) on your wounds and all the stitches are absorbable.  Leave the steri-strips in place when changing your dressings, they will peel off with time, usually 2-3 weeks.  Activity:  Increase activity slowly as tolerated, but follow the weight bearing instructions below.  The rules on driving is that you can not be taking narcotics while you drive, and you must feel in control of the vehicle.    Weight Bearing:   Non wieght bearing with left arm,  sling at all times except for hygiene  To prevent constipation: you may use a stool softener such as -  Colace (over the counter) 100 mg by mouth twice a day  Drink plenty of fluids (prune juice may be helpful) and high fiber foods Miralax (over the counter) for constipation as needed.    Itching:  If you experience itching with your medications, try taking only a single pain pill, or even half a pain pill at a time.  You may take up to 10 pain pills per day, and you can also use benadryl over the counter for itching or also to help with sleep.   Precautions:  If you experience chest pain or shortness of breath - call 911 immediately for transfer to the hospital emergency department!!  If you develop a fever greater that 101 F, purulent drainage from wound, increased redness or drainage from wound, or calf pain -- Call the office at [phone]                                                Follow- Up Appointment:  Please call for an appointment to be seen in 2 weeks [facility] - [phone]   Information for Discharge Teaching: EXPAREL (bupivacaine liposome injectable suspension)   Your surgeon or anesthesiologist gave you EXPAREL(bupivacaine) to help control your pain after surgery.   EXPAREL is a local anesthetic that provides pain relief by numbing the tissue around the surgical site.  EXPAREL is designed to release pain medication over time and can control pain for up to 72 hours.  Depending on how you respond to EXPAREL, you may require less pain medication during your recovery.  Possible side effects:  Temporary loss of sensation or ability to move in the area where bupivacaine was injected.  Nausea, vomiting, constipation  Rarely, numbness and tingling in your mouth or lips, lightheadedness, or anxiety may occur.  Call your doctor right away if you think you may be experiencing any of these sensations, or if you have other questions regarding possible side  effects.  Follow all other discharge instructions given to you by your surgeon or nurse. Eat a healthy diet and drink plenty of water or other fluids.  If you return to the hospital for any reason within 96 hours following the administration of EXPAREL, it is important for health care providers to know that you have received this anesthetic. A teal colored band has been placed on your arm with the date, time and amount of EXPAREL you have received in order to alert and inform your health care providers. Please leave this armband in place for the full 96 hours following administration, and then you may remove the band.

## 2020-06-30 NOTE — H&P (Signed)
PREOPERATIVE H&P  Chief Complaint: left shoulder pain  HPI: Bridget [name] is a 59 y.o. female who presents for preoperative history and physical with a diagnosis of left shoulder full-thickness rotator cuff tear. Symptoms are rated as moderate to severe, and have been worsening.  This is significantly impairing activities of daily living.  She has elected for surgical management.  She is failed injections, activity modification, exercises.  Past Medical History:  Diagnosis Date  . Bipolar II disorder (HCC)   . Community acquired pneumonia of right upper lobe of lung 08/14/2017  . Depression   . GERD (gastroesophageal reflux disease)   . Near syncope   . Obesity, morbid (HCC)   . Osteoarthritis    Past Surgical History:  Procedure Laterality Date  . BONE SPURS     REMOVED FROM SHOULDERS  . EYE SURGERY     STY REMOVED LEFT EYE  1992  . ROTATOR CUFF REPAIR     BILATERAL  . TOTAL HIP ARTHROPLASTY  08/15/2011   Procedure: TOTAL HIP ARTHROPLASTY;  Surgeon: Eulas Post;  Location: [facility] OR;  Service: Orthopedics;  Laterality: Right;  . TOTAL HIP ARTHROPLASTY Left 02/07/2017  . TOTAL HIP ARTHROPLASTY Left 02/07/2017   Procedure: LEFT TOTAL HIP ARTHROPLASTY;  Surgeon: Teryl Lucy, MD;  Location: [facility] OR;  Service: Orthopedics;  Laterality: Left;   Social History   Socioeconomic History  . Marital status: Single    Spouse name: Not on file  . Number of children: 4  . Years of education: Not on file  . Highest education level: Not on file  Occupational History  . Occupation: disabled  Tobacco Use  . Smoking status: Current Every Day Smoker    Packs/day: 0.50    Years: 20.00    Pack years: 10.00    Types: Cigarettes  . Smokeless tobacco: Former Neurosurgeon    Quit date: 02/06/2017  Vaping Use  . Vaping Use: Never used  Substance and Sexual Activity  . Alcohol use: No  . Drug use: Yes  . Sexual activity: Yes  Other Topics Concern  . Not on file  Social History Narrative  . Not  on file   Social Determinants of Health   Financial Resource Strain:   . Difficulty of Paying Living Expenses: Not on file  Food Insecurity:   . Worried About Programme researcher, broadcasting/film/video in the Last Year: Not on file  . Ran Out of Food in the Last Year: Not on file  Transportation Needs:   . Lack of Transportation (Medical): Not on file  . Lack of Transportation (Non-Medical): Not on file  Physical Activity:   . Days of Exercise per Week: Not on file  . Minutes of Exercise per Session: Not on file  Stress:   . Feeling of Stress : Not on file  Social Connections:   . Frequency of Communication with Friends and Family: Not on file  . Frequency of Social Gatherings with Friends and Family: Not on file  . Attends Religious Services: Not on file  . Active Member of Clubs or Organizations: Not on file  . Attends Banker Meetings: Not on file  . Marital Status: Not on file   Family History  Problem Relation Age of Onset  . Bipolar disorder Sister   . Hypertension Sister   . Hypertension Brother   . Diabetes Brother   . Heart disease Brother   . Hypertension Other   . Hypertension Mother   . Rheum arthritis  Mother   . Heart disease Mother   . Hypertension Maternal Aunt   . Cancer Father   . Asthma Daughter   . Asthma Son    Allergies  Allergen Reactions  . Pregabalin Shortness Of Breath  . Tramadol Itching    Makes feet tingle  . Gabapentin Rash and Other (See Comments)    Lightheaded, see spots  . Cymbalta [Duloxetine Hcl] Other (See Comments)    Paranoid thoughts   Prior to Admission medications   Medication Sig Start Date End Date Taking? Authorizing Provider  acetaminophen (TYLENOL) 325 MG tablet Take 650 mg by mouth every 6 (six) hours as needed for mild pain.   Yes [provider]  cyclobenzaprine (FLEXERIL) 10 MG tablet Take 1 tablet (10 mg total) by mouth 2 (two) times daily as needed for muscle spasms. 06/06/20  Yes Bast, Traci A, NP  ergocalciferol  (DRISDOL) 200 MCG/ML drops Take 4,000 Units by mouth daily.   Yes [provider]  hydrOXYzine (ATARAX/VISTARIL) 10 MG tablet Take 1 tablet (10 mg total) by mouth 3 (three) times daily as needed. 03/18/20  Yes Marthenia Rolling, DO  Multiple Vitamin (MULTIVITAMIN) capsule Take 1 capsule by mouth daily.   Yes [provider]  naproxen (NAPROSYN) 500 MG tablet Take 1 tablet (500 mg total) by mouth 2 (two) times daily. 06/06/20  Yes Bast, Traci A, NP  omeprazole (PRILOSEC) 20 MG capsule Take 1 capsule (20 mg total) by mouth daily. Patient taking differently: Take 20 mg by mouth daily as needed (acid reflux).  03/18/20  Yes Marthenia Rolling, DO  simethicone (MYLICON) 125 MG chewable tablet Chew 125 mg by mouth every 6 (six) hours as needed for flatulence.   Yes [provider]  sertraline (ZOLOFT) 25 MG tablet Take 1 tablet (25 mg total) by mouth daily. 03/18/20 04/17/20  Marthenia Rolling, DO     Positive ROS: All other systems have been reviewed and were otherwise negative with the exception of those mentioned in the HPI and as above.  Physical Exam: General: Alert, no acute distress Cardiovascular: No pedal edema Respiratory: No cyanosis, no use of accessory musculature GI: No organomegaly, abdomen is soft and non-tender Skin: No lesions in the area of chief complaint Neurologic: Sensation intact distally Psychiatric: Patient is competent for consent with normal mood and affect Lymphatic: No axillary or cervical lymphadenopathy  MUSCULOSKELETAL: Left shoulder active motion is limited secondary to pain, weakness with positive drop arm sign.  Assessment: Left shoulder rotator cuff tear with impingement syndrome.   Plan: Plan for Procedure(s): LEFT SHOULDER ARTHROSCOPY DEBRIDEMENT, ACROMIOPLASTY, DISTAL CLAVICLE EXCISION,ROTATOR CUFF REPAIR  The risks benefits and alternatives were discussed with the patient including but not limited to the risks of nonoperative treatment, versus  surgical intervention including infection, bleeding, nerve injury,  blood clots, cardiopulmonary complications, morbidity, mortality, among others, and they were willing to proceed.      Eulas Post, MD Cell [phone]   06/30/2020 7:17 AM

## 2020-06-30 NOTE — Anesthesia Procedure Notes (Signed)
Anesthesia Regional Block: Interscalene brachial plexus block   Pre-Anesthetic Checklist: ,, timeout performed, Correct Patient, Correct Site, Correct Laterality, Correct Procedure, Correct Position, site marked, Risks and benefits discussed,  Surgical consent,  Pre-op evaluation,  At surgeon's request and post-op pain management  Laterality: Left  Prep: chloraprep       Needles:  Injection technique: Single-shot  Needle Type: Stimiplex     Needle Length: 9cm  Needle Gauge: 21     Additional Needles:   Procedures:,,,, ultrasound used (permanent image in chart),,,,  Narrative:  Start time: 06/30/2020 7:06 AM End time: 06/30/2020 7:11 AM Injection made incrementally with aspirations every 5 mL.  Performed by: Personally  Anesthesiologist: Lowella Curb, MD

## 2020-06-30 NOTE — Anesthesia Procedure Notes (Signed)
Procedure Name: LMA Insertion Date/Time: 06/30/2020 7:38 AM Performed by: Jhonnie Garner, CRNA Pre-anesthesia Checklist: Patient identified, Emergency Drugs available, Suction available and Patient being monitored Patient Re-evaluated:Patient Re-evaluated prior to induction Oxygen Delivery Method: Circle system utilized Preoxygenation: Pre-oxygenation with 100% oxygen Induction Type: IV induction Ventilation: Mask ventilation without difficulty LMA: LMA inserted LMA Size: 4.0 Number of attempts: 1 Placement Confirmation: positive ETCO2,  CO2 detector and breath sounds checked- equal and bilateral Tube secured with: Tape Dental Injury: Teeth and Oropharynx as per pre-operative assessment

## 2020-07-01 ENCOUNTER — Ambulatory Visit (INDEPENDENT_AMBULATORY_CARE_PROVIDER_SITE_OTHER): Payer: Medicaid Other | Admitting: Psychiatry

## 2020-07-01 ENCOUNTER — Encounter (HOSPITAL_COMMUNITY): Payer: Self-pay | Admitting: Psychiatry

## 2020-07-01 DIAGNOSIS — F4310 Post-traumatic stress disorder, unspecified: Secondary | ICD-10-CM | POA: Diagnosis not present

## 2020-07-01 DIAGNOSIS — F331 Major depressive disorder, recurrent, moderate: Secondary | ICD-10-CM | POA: Diagnosis not present

## 2020-07-01 NOTE — Progress Notes (Signed)
Virtual Visit via Video Note  I connected with Bridget [name] on 26/94/85 at  2:30 PM EDT by a video enabled telemedicine application and verified that I am speaking with the correct person using two identifiers.  Location: Patient: Home Provider: Home Office   I discussed the limitations of evaluation and management by telemedicine and the availability of in person appointments. The patient expressed understanding and agreed to proceed.  History of Present Illness: MDD and PTSD  Treatment Plan Goals: 1) Bridget [name] would like to process grief and loss experiences to decrease depressive symptoms. 2) Bridget [name] would like to process and heal from trauma experiences to decrease instances of trauma triggers/reactions with impact her daily functioning and fulfillment in relationships.  Observations/Objective: Counselor met with Client for individual therapyin person in the office. Counselor assessed MH symptoms and progress on treatment plan goals, with patient reportingthat she is recovering from her shoulder surgery that took place yesterday. Client noted that medications have her "in and out", but that she would like to move forward with therapy because she was in emotional distress.Client presents withseveredepression and severeanxiety. Client denied suicidal ideation or self-harm behaviors.   Counselor first addressed Goal 1, as the Client expressed distress related to the way she is being treated by her adult children, vs. The expectation she had for their relationships within adulthood and her older years. Counselor assessed the trigger for her comments with the Client sharing about her daughter reluctantly and barely helping her in the wake of her surgery. Client expressed disappointment and anger at the lack of care, preparation or love expressed to her by her daughter, feeling more of an obligation or inconvience to her. Counselor used CBT interventions to process thoughts, feelings and  behaviors. Counselor additionally discussed other losses the Client has experienced secondary, such as changes in extended family, society and the culture as a whole. Counselor prompted Client to engage in de-escalation and relaxation techniques to decrease anxiety and body responses. Client encouraged rest and communicating needs until they are met by her support system. Client stated that she would attempt to give herself permission to recover without getting sucked into family dynamics. Counselor validated feelings and mindset.  Assessment and Plan: Counselor will continue to meet with patient to address treatment plan goals. Patient will continue to follow recommendations of providers and implement skills learned in session.  Follow Up Instructions: Counselor will send information for next session via Webex.   The patient was advised to call back or seek an in-person evaluation if the symptoms worsen or if the condition fails to improve as anticipated.  I provided 60 minutes of non-face-to-face time during this encounter.   Bridget Auer, LCSW

## 2020-07-02 ENCOUNTER — Encounter (HOSPITAL_BASED_OUTPATIENT_CLINIC_OR_DEPARTMENT_OTHER): Payer: Self-pay | Admitting: Orthopedic Surgery

## 2020-07-06 ENCOUNTER — Ambulatory Visit (INDEPENDENT_AMBULATORY_CARE_PROVIDER_SITE_OTHER): Payer: Medicaid Other | Admitting: Psychiatry

## 2020-07-06 ENCOUNTER — Other Ambulatory Visit: Payer: Self-pay

## 2020-07-06 ENCOUNTER — Encounter (HOSPITAL_COMMUNITY): Payer: Self-pay | Admitting: Psychiatry

## 2020-07-06 DIAGNOSIS — F331 Major depressive disorder, recurrent, moderate: Secondary | ICD-10-CM | POA: Diagnosis not present

## 2020-07-06 DIAGNOSIS — F4310 Post-traumatic stress disorder, unspecified: Secondary | ICD-10-CM
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
## 2020-07-09 ENCOUNTER — Encounter: Payer: Self-pay | Admitting: Neurology

## 2020-07-09 ENCOUNTER — Ambulatory Visit (INDEPENDENT_AMBULATORY_CARE_PROVIDER_SITE_OTHER): Payer: Medicaid Other | Admitting: Neurology

## 2020-07-09 ENCOUNTER — Other Ambulatory Visit: Payer: Self-pay

## 2020-07-09 VITALS — BP 116/79 | HR 94 | Ht 61.0 in | Wt 188.2 lb

## 2020-07-09 DIAGNOSIS — R55 Syncope and collapse: Secondary | ICD-10-CM | POA: Diagnosis not present

## 2020-07-09 NOTE — Patient Instructions (Signed)
1. Schedule routine EEG  2. Continue follow-up with your surgeon to discuss hand symptoms  3. Our office will call you with brain wave test results, if normal, follow-up as needed. Call for any changes
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
## 2020-07-13 ENCOUNTER — Other Ambulatory Visit: Payer: Self-pay

## 2020-07-13 ENCOUNTER — Ambulatory Visit (INDEPENDENT_AMBULATORY_CARE_PROVIDER_SITE_OTHER): Payer: Medicaid Other | Admitting: Neurology

## 2020-07-13 DIAGNOSIS — R55 Syncope and collapse: Secondary | ICD-10-CM

## 2020-07-13 NOTE — Procedures (Signed)
ELECTROENCEPHALOGRAM REPORT  Date of Study: 07/13/2020  Patient's Name: Bridget [name] MRN: [id number] Date of Birth: 07-09-1961  Referring Provider: Dr. Patrcia Dolly  Clinical History: This is a 59 year old woman with recurrent syncope.  Medications: TYLENOL 325 MG tablet BLACK CURRANT SEED OIL PO FLEXERIL 10 MG tablet DRISDOL 200 MCG/ML drops ATARAX/VISTARIL 10 MG tablet MULTIVITAMIN capsule PRILOSEC 20 MG capsule ROXICODONE 5 MG immediate release tablet ZOLOFT 25 MG tablet   Technical Summary: A multichannel digital EEG recording measured by the international 10-20 system with electrodes applied with paste and impedances below 5000 ohms performed in our laboratory with EKG monitoring in an awake and asleep patient.  Hyperventilation was not performed. Photic stimulation was performed.  The digital EEG was referentially recorded, reformatted, and digitally filtered in a variety of bipolar and referential montages for optimal display.    Description: The patient is awake and asleep during the recording.  During maximal wakefulness, there is a symmetric, medium voltage 10 Hz posterior dominant rhythm that attenuates with eye opening.  The record is symmetric.  During drowsiness and stage I sleep, there is an increase in theta slowing of the background with occasional vertex waves seen.  Photic stimulation did not elicit any abnormalities.  There were no epileptiform discharges or electrographic seizures seen.    EKG lead was unremarkable.  Impression: This awake and asleep EEG is normal.    Clinical Correlation: A normal EEG does not exclude a clinical diagnosis of epilepsy.  If further clinical questions remain, prolonged EEG may be helpful.  Clinical correlation is advised.   Patrcia Dolly, M.D.

## 2020-07-14 ENCOUNTER — Ambulatory Visit (HOSPITAL_COMMUNITY): Payer: Medicaid Other | Admitting: Psychiatry

## 2020-07-15 ENCOUNTER — Ambulatory Visit (INDEPENDENT_AMBULATORY_CARE_PROVIDER_SITE_OTHER): Payer: Medicaid Other | Admitting: Family Medicine

## 2020-07-15 ENCOUNTER — Other Ambulatory Visit: Payer: Self-pay

## 2020-07-15 ENCOUNTER — Other Ambulatory Visit (HOSPITAL_COMMUNITY)
Admission: RE | Admit: 2020-07-15 | Discharge: 2020-07-15 | Disposition: A | Discharge status: Home / Self Care | Payer: Medicaid Other | Source: Ambulatory Visit | Attending: Family Medicine | Admitting: Family Medicine

## 2020-07-15 VITALS — BP 118/76 | HR 89 | Ht 61.0 in | Wt 188.4 lb

## 2020-07-15 DIAGNOSIS — Z124 Encounter for screening for malignant neoplasm of cervix: Secondary | ICD-10-CM

## 2020-07-15 DIAGNOSIS — R8290 Unspecified abnormal findings in urine: Secondary | ICD-10-CM | POA: Insufficient documentation

## 2020-07-15 HISTORY — DX: Encounter for screening for malignant neoplasm of cervix: Z12.4

## 2020-07-15 HISTORY — DX: Unspecified abnormal findings in urine: R82.90

## 2020-07-15 LAB — POCT URINALYSIS DIP (CLINITEK)
Blood, UA: NEGATIVE
Glucose, UA: NEGATIVE mg/dL
Leukocytes, UA: NEGATIVE
Nitrite, UA: NEGATIVE
Spec Grav, UA: 1.025 (ref 1.010–1.025)
Urobilinogen, UA: 0.2 E.U./dL
pH, UA: 5.5 (ref 5.0–8.0)

## 2020-07-15 NOTE — Patient Instructions (Signed)
Thank you for coming in to see [place] today! Please see below to review our plan for today's visit:  We will look at your urine for any signs of infection and will call you to treat as needed. I will call you regarding your pap smear results.   Please call the clinic at [phone] if your symptoms worsen or you have any concerns. It was our pleasure to serve you!   Dr. Peggyann Shoals [facility]  CBC    Component Value Date/Time   WBC 9.4 03/26/2020 0912   RBC 4.13 03/26/2020 0912   HGB 12.1 03/26/2020 0912   HGB 13.2 03/06/2020 1624   HCT 38.1 03/26/2020 0912   HCT 39.6 03/06/2020 1624   PLT 329 03/26/2020 0912   PLT 347 03/06/2020 1624   MCV 92.3 03/26/2020 0912   MCV 89 03/06/2020 1624   MCH 29.3 03/26/2020 0912   MCHC 31.8 03/26/2020 0912   RDW 13.9 03/26/2020 0912   RDW 13.7 03/06/2020 1624   LYMPHSABS 2.3 10/08/2018 1415   MONOABS 0.7 08/14/2017 0800   EOSABS 0.1 10/08/2018 1415   BASOSABS 0.1 10/08/2018 1415   CMP - look for most recent lab results from March 26, 2020     Component Value Date/Time   NA 141 03/26/2020 0912   NA 141 03/06/2020 1624   K 4.3 03/26/2020 0912   CL 108 03/26/2020 0912   CO2 24 03/26/2020 0912   GLUCOSE 99 03/26/2020 0912   BUN 9 03/26/2020 0912   BUN 8 03/06/2020 1624   CREATININE 0.74 03/26/2020 0912   CREATININE 0.86 11/01/2016 1629   CALCIUM 9.6 03/26/2020 0912   PROT 7.0 03/06/2020 1624   ALBUMIN 4.8 03/06/2020 1624   AST 13 03/06/2020 1624   ALT 9 03/06/2020 1624   ALKPHOS 63 03/06/2020 1624   BILITOT 0.3 03/06/2020 1624   GFRNONAA >60 03/26/2020 0912   GFRNONAA 77 11/01/2016 1629   GFRAA >60 03/26/2020 0912   GFRAA 89 11/01/2016 1629

## 2020-07-15 NOTE — Progress Notes (Signed)
    SUBJECTIVE:   CHIEF COMPLAINT / HPI:   Pap smear: Patient presents to access to care clinic today for routine Pap smear.  Last Pap for January 2018 showed normal Pap results, and ILM, no HPV detected.  Concern for UTI: Patient reports urinary frequency, urinary urgency, and "ammonia" smell to her urine.  Denies any pelvic pain, fevers, chills, body aches.  Health maintenance: Patient due for Tdap, Pap smear, and flu vaccine  PERTINENT  PMH / PSH:  BMI 36, history of anxiety, bipolar disorder   OBJECTIVE:   BP 118/76   Pulse 89   Ht 5\' 1"  (1.549 m)   Wt 188 lb 6.4 oz (85.5 kg)   LMP  (LMP Unknown)   SpO2 97%   BMI 35.60 kg/m    Physical exam: General: Well-appearing patient, anxious Respiratory: Normal work of breathing, speaking in complete sentences GU: Normal-appearing labia minora and majora without lesion, normal-appearing vaginal rugae, minimal thin white vaginal discharge appreciated, cervix appreciated without lesion or bleeding, cervical os closed   ASSESSMENT/PLAN:   Encounter for screening for cervical cancer -Patient had Pap smear performed today's visit -We will follow-up with results and treat as needed  Malodorous urine Patient presents to clinic today with concern for malodorous urine.  Sometimes feels that she has urinary urgency, denies any dysuria or abdominal/pelvic pain.  Denies any fevers, body aches, chills. -Urinalysis and culture performed at today's visit -We will follow-up with results and treat as needed     , DO [facility]

## 2020-07-15 NOTE — Assessment & Plan Note (Signed)
Patient presents to clinic today with concern for malodorous urine.  Sometimes feels that she has urinary urgency, denies any dysuria or abdominal/pelvic pain.  Denies any fevers, body aches, chills. -Urinalysis and culture performed at today's visit -We will follow-up with results and treat as needed

## 2020-07-15 NOTE — Assessment & Plan Note (Signed)
-  Patient had Pap smear performed today's visit -We will follow-up with results and treat as needed

## 2020-07-16 ENCOUNTER — Telehealth: Payer: Self-pay

## 2020-07-16 NOTE — Telephone Encounter (Signed)
Pt called and informed that EEG was normal. She would like results mailed to her

## 2020-07-16 NOTE — Telephone Encounter (Signed)
-----   Message from Van Clines, MD sent at 07/16/2020 12:57 PM EDT ----- Pls let her know brain wave test was normal. Thanks

## 2020-07-20 ENCOUNTER — Other Ambulatory Visit: Payer: Self-pay

## 2020-07-20 ENCOUNTER — Ambulatory Visit (INDEPENDENT_AMBULATORY_CARE_PROVIDER_SITE_OTHER): Payer: Medicaid Other | Admitting: Psychiatry

## 2020-07-20 ENCOUNTER — Encounter (HOSPITAL_COMMUNITY): Payer: Self-pay | Admitting: Psychiatry

## 2020-07-20 DIAGNOSIS — F4310 Post-traumatic stress disorder, unspecified: Secondary | ICD-10-CM | POA: Diagnosis not present

## 2020-07-20 DIAGNOSIS — F331 Major depressive disorder, recurrent, moderate: Secondary | ICD-10-CM | POA: Diagnosis not present

## 2020-07-20 LAB — CYTOLOGY - PAP
Chlamydia: NEGATIVE
Comment: NEGATIVE
Comment: NEGATIVE
Comment: NORMAL
Diagnosis: NEGATIVE
High risk HPV: NEGATIVE
Neisseria Gonorrhea: NEGATIVE
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
## 2020-07-21 ENCOUNTER — Encounter: Payer: Self-pay | Admitting: Family Medicine

## 2020-07-24 ENCOUNTER — Telehealth: Payer: Self-pay | Admitting: Neurology

## 2020-07-24 NOTE — Telephone Encounter (Signed)
Patient called in wanting to let Dr. Karel Jarvis know she is still having pressure on the bottom left of her head. She said it is very uncomfortable. She also mentioned she was supposed to get the results of her EEG mailed to her and she has not received that.

## 2020-07-27 ENCOUNTER — Other Ambulatory Visit: Payer: Self-pay

## 2020-07-27 DIAGNOSIS — R202 Paresthesia of skin: Secondary | ICD-10-CM

## 2020-07-27 NOTE — Telephone Encounter (Signed)
Pls let her know the EEG was normal, pls mail report as requested. For the pressure in the bottom of her head, this is most likely due to muscle spasm in her neck, pls have her discuss this with her PCP. Thanks

## 2020-07-27 NOTE — Telephone Encounter (Signed)
Spoke with pt the EEG was normal,she got her results in the mail, For the pressure in the bottom of her head, this is most likely due to muscle spasm in her neck, pls have her discuss this with her PCP, pt has an appointment with Dr patel on 11/15.

## 2020-08-04 ENCOUNTER — Ambulatory Visit (INDEPENDENT_AMBULATORY_CARE_PROVIDER_SITE_OTHER): Payer: Medicaid Other | Admitting: Psychiatry

## 2020-08-04 ENCOUNTER — Other Ambulatory Visit: Payer: Self-pay

## 2020-08-04 ENCOUNTER — Encounter (HOSPITAL_COMMUNITY): Payer: Self-pay | Admitting: Psychiatry

## 2020-08-04 DIAGNOSIS — F4310 Post-traumatic stress disorder, unspecified: Secondary | ICD-10-CM | POA: Diagnosis not present

## 2020-08-04 DIAGNOSIS — F331 Major depressive disorder, recurrent, moderate: Secondary | ICD-10-CM

## 2020-08-04 NOTE — Progress Notes (Signed)
Virtual Visit via Video Note  I connected with Bridget [name] on 07/29/24 at  2:30 PM EDT by a video enabled telemedicine application and verified that I am speaking with the correct person using two identifiers.  History of Present Illness: MDD and PTSD  Treatment Plan Goals: 1) Bridget [name] would like to process grief and loss experiences to decrease depressive symptoms. 2) Bridget [name] would like to process and heal from trauma experiences to decrease instances of trauma triggers/reactions with impact her daily functioning and fulfillment in relationships.  Observations/Objective: Counselor met with Client for individual therapy via Webex. Counselor assessed MH symptoms and progress on treatment plan goals, with patient reportingthat she continues to have physical pain, which impacts her ability to sleep, which impacts her emotional and mental well-being. Client reports issues of sadness, grief, loneliness, and frustrations. Client presents withmoderatedepression and moderateanxiety. Client denied suicidal ideation or self-harm behaviors.   Goal 1: Counselor prompted client to share about recent grief and loss issues/responses. Client shared about a series of grief related triggers that she is coping with. Counselor used CBT interventions to explore thoughts, feelings and behaviors associated with core beliefs about context of relationships and expectations, causing the grief. Client able to identify root cause of feelings and her efforts to communicate losses with family. Client to reach out to family and friends to spend more quality time. Client identified ways new traditions and time can be spent together to meet emotional needs.  Goal 2: Counselor assess issues related with trauma work progress and challenges. Client shared that the changes in her body and pain in body has been traumatic, when she reflects on now compared to past. Client working with providers to address medical issues. Counselor  recommended self-compassion and self-soothing practices, with Client identifying practical application of skills. Client to apply skills and report back at next session.   Client additionally reports accessing community supports, resources and support groups to address Goal 1 and 2.   Assessment and Plan: Counselor will continue to meet with patient to address treatment plan goals. Patient will continue to follow recommendations of providers and implement skills learned in session.  Follow Up Instructions: Counselor will send information for next session via Webex.   The patient was advised to call back or seek an in-person evaluation if the symptoms worsen or if the condition fails to improve as anticipated.  I provided 39  minutes of non-face-to-face time during this encounter.   [name], LCSW

## 2020-08-11 ENCOUNTER — Encounter (HOSPITAL_COMMUNITY): Payer: Self-pay | Admitting: Psychiatry

## 2020-08-11 ENCOUNTER — Other Ambulatory Visit: Payer: Self-pay

## 2020-08-11 ENCOUNTER — Ambulatory Visit (INDEPENDENT_AMBULATORY_CARE_PROVIDER_SITE_OTHER): Payer: Medicaid Other | Admitting: Psychiatry

## 2020-08-11 DIAGNOSIS — F4310 Post-traumatic stress disorder, unspecified: Secondary | ICD-10-CM

## 2020-08-11 DIAGNOSIS — F331 Major depressive disorder, recurrent, moderate: Secondary | ICD-10-CM

## 2020-08-11 NOTE — Progress Notes (Signed)
Virtual Visit via Video Note  I connected with Bridget [name] on [birth year] at  2:30 PM EST by a video enabled telemedicine application and verified that I am speaking with the correct person using two identifiers.  Location: Patient: Patient Home Provider: Home Office  I discussed the limitations of evaluation and management by telemedicine and the availability of in person appointments. The patient expressed understanding and agreed to proceed.  History of Present Illness: MDD and PTSD  Treatment Plan Goals: 1) Bridget [name] would like to process grief and loss experiences to decrease depressive symptoms. 2) Bridget [name] would like to process and heal from trauma experiences to decrease instances of trauma triggers/reactions with impact her daily functioning and fulfillment in relationships.  Observations/Objective: Counselor met with Client for individual therapy via Webex. Counselor assessed MH symptoms and progress on treatment plan goals, with patient reportingthat she is having challenges with sleep due to pain and discomfort from recent shoulder surgery. Client noted she has been irritable, agitated, short-tempered and easily upsets due to lack of sleep. Client presents withmoderatedepression and moderateanxiety. Client denied suicidal ideation or self-harm behaviors.   Counselor prompted client to share progress on goals with Counselor utilizing MI interventions to assess and prompt motivation to change. Client discussed trauma exposure, grief and loss issues and elements of daily functioning. Counselor and Client discussed strategies in addressing current concerns using CBT skills. Client to report back application of skills.   Assessment and Plan: Counselor will continue to meet with patient to address treatment plan goals. Patient will continue to follow recommendations of providers and implement skills learned in session.  Follow Up Instructions: Counselor will send information  for next session via Webex.   The patient was advised to call back or seek an in-person evaluation if the symptoms worsen or if the condition fails to improve as anticipated.  I provided 39  minutes of non-face-to-face time during this encounter.   Bridget Auer, LCSW

## 2020-08-18 ENCOUNTER — Encounter (HOSPITAL_COMMUNITY): Payer: Self-pay | Admitting: Psychiatry

## 2020-08-18 ENCOUNTER — Other Ambulatory Visit: Payer: Self-pay

## 2020-08-18 ENCOUNTER — Ambulatory Visit (INDEPENDENT_AMBULATORY_CARE_PROVIDER_SITE_OTHER): Payer: Medicaid Other | Admitting: Psychiatry

## 2020-08-18 DIAGNOSIS — F4310 Post-traumatic stress disorder, unspecified: Secondary | ICD-10-CM

## 2020-08-18 DIAGNOSIS — F331 Major depressive disorder, recurrent, moderate: Secondary | ICD-10-CM | POA: Diagnosis not present
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
## 2020-08-25 ENCOUNTER — Encounter (HOSPITAL_COMMUNITY): Payer: Self-pay | Admitting: Psychiatry

## 2020-08-25 ENCOUNTER — Other Ambulatory Visit: Payer: Self-pay

## 2020-08-25 ENCOUNTER — Telehealth: Payer: Self-pay

## 2020-08-25 ENCOUNTER — Ambulatory Visit (INDEPENDENT_AMBULATORY_CARE_PROVIDER_SITE_OTHER): Payer: Medicaid Other | Admitting: Psychiatry

## 2020-08-25 DIAGNOSIS — F331 Major depressive disorder, recurrent, moderate: Secondary | ICD-10-CM

## 2020-08-25 NOTE — Telephone Encounter (Signed)
Patient calls nurse line requesting that we remove hydroxyzine for anxiety. Patient states that she has not taken medication in 2-3 months. Patient reports that this needs to be removed so that pain specialist can prescribe her medications. I have marked in the patient's chart that she is no longer taking medication. Please advise if medication can be discontinued from medication list.   Veronda Prude, RN

## 2020-08-25 NOTE — Telephone Encounter (Signed)
I discontinued it.

## 2020-08-25 NOTE — Progress Notes (Addendum)
Virtual Visit via Video Note  I connected with Bridget [name] on 61/53/79 at  2:30 PM EST by a video enabled telemedicine application and verified that I am speaking with the correct person using two identifiers.  Location: Patient: Patient Home Provider: Home Office  I discussed the limitations of evaluation and management by telemedicine and the availability of in person appointments. The patient expressed understanding and agreed to proceed.  History of Present Illness: MDD and PTSD  Treatment Plan Goals: 1) Bridget [name] would like to process grief and loss experiences to decrease depressive symptoms. 2) Bridget [name] would like to process and heal from trauma experiences to decrease instances of trauma triggers/reactions with impact her daily functioning and fulfillment in relationships.  Observations/Objective: Counselor met with Client for individual therapy via Webex. Counselor assessed MH symptoms and progress on treatment plan goals, with patient reportingthatshe is having challenges with sleep due to pain and discomfort from recent shoulder surgery. Client anticipates increased emotions in spending time with family over the holidays. Client making efforts to be hopeful that expectations will not hinder her enjoyment of the interactions. Client noted she has been irritable, agitated, short-tempered and easily upsets due to lack of sleep.Client presents withmoderatedepression and moderateanxiety. Client denied suicidal ideation or self-harm behaviors.  Counselor prompted client to share progress on goals with Counselor utilizing MI interventions to assess and prompt motivation to change. Client discussed trauma exposure, grief and loss issues and elements of daily functioning. Counselor and Client discussed strategies in addressing current concerns using CBT skills. Client to report back application of skills.   Assessment and Plan: Counselor will continue to meet with patient to  address treatment plan goals. Patient will continue to follow recommendations of providers and implement skills learned in session.  Follow Up Instructions: Counselor will send information for next session via Webex.   The patient was advised to call back or seek an in-person evaluation if the symptoms worsen or if the condition fails to improve as anticipated.  I provided3mnutes of non-face-to-face time during this encounter.   BLise Auer LCSW

## 2020-09-01 ENCOUNTER — Other Ambulatory Visit: Payer: Self-pay

## 2020-09-01 ENCOUNTER — Ambulatory Visit (INDEPENDENT_AMBULATORY_CARE_PROVIDER_SITE_OTHER): Payer: Medicaid Other | Admitting: Psychiatry

## 2020-09-01 DIAGNOSIS — F4310 Post-traumatic stress disorder, unspecified: Secondary | ICD-10-CM

## 2020-09-01 DIAGNOSIS — F331 Major depressive disorder, recurrent, moderate: Secondary | ICD-10-CM

## 2020-09-03 ENCOUNTER — Telehealth: Payer: Self-pay

## 2020-09-03 NOTE — Telephone Encounter (Signed)
Can we find out what she would like the pain medication for before I prescribe her anything?

## 2020-09-03 NOTE — Telephone Encounter (Signed)
Patient calls nurse line requesting Naproxen or Ibuprofen to be called into her pharmacy. I advised patient neither one is on her current med list. Will forward to PCP.

## 2020-09-04 ENCOUNTER — Other Ambulatory Visit: Payer: Self-pay

## 2020-09-04 ENCOUNTER — Ambulatory Visit: Payer: Medicaid Other | Attending: Orthopedic Surgery

## 2020-09-04 DIAGNOSIS — M6281 Muscle weakness (generalized): Secondary | ICD-10-CM | POA: Diagnosis present

## 2020-09-04 DIAGNOSIS — M25512 Pain in left shoulder: Secondary | ICD-10-CM | POA: Insufficient documentation

## 2020-09-04 DIAGNOSIS — M25612 Stiffness of left shoulder, not elsewhere classified: Secondary | ICD-10-CM | POA: Diagnosis present

## 2020-09-04 DIAGNOSIS — Z9889 Other specified postprocedural states: Secondary | ICD-10-CM | POA: Diagnosis present

## 2020-09-04 DIAGNOSIS — R1310 Dysphagia, unspecified: Secondary | ICD-10-CM | POA: Diagnosis present

## 2020-09-05 NOTE — Therapy (Signed)
[facility] [place] [address] [place], [place], [address] Phone: [phone]   Fax:  [phone]  Physical Therapy Evaluation  Patient Details  Name: Bridget [name] MRN: [id number] Date of Birth: January 07, 1961 Referring Provider (PT): Teryl Lucy, MD   Encounter Date: 09/04/2020   PT End of Session - 09/05/20 0921    Visit Number 1    Number of Visits 17    Date for PT Re-Evaluation 11/07/20    Authorization Type MEDICAID [place] ACCESS    PT Start Time 1300    PT Stop Time 1344    PT Time Calculation (min) 44 min    Activity Tolerance Patient tolerated treatment well    Behavior During Therapy [facility] for tasks assessed/performed           Past Medical History:  Diagnosis Date  . Bipolar II disorder (HCC)   . Community acquired pneumonia of right upper lobe of lung 08/14/2017  . Depression   . GERD (gastroesophageal reflux disease)   . Near syncope   . Obesity, morbid (HCC)   . Osteoarthritis     Past Surgical History:  Procedure Laterality Date  . BICEPT TENODESIS Left 06/30/2020   Procedure: BICEPS TENODESIS;  Surgeon: Teryl Lucy, MD;  Location: [facility];  Service: Orthopedics;  Laterality: Left;  . BONE SPURS     REMOVED FROM SHOULDERS  . EYE SURGERY     STY REMOVED LEFT EYE  1992  . ROTATOR CUFF REPAIR     BILATERAL  . SHOULDER ARTHROSCOPY WITH DISTAL CLAVICLE RESECTION Left 06/30/2020   Procedure: SHOULDER ARTHROSCOPY WITH DISTAL CLAVICLE RESECTION;  Surgeon: Teryl Lucy, MD;  Location: [facility];  Service: Orthopedics;  Laterality: Left;  . SHOULDER ARTHROSCOPY WITH ROTATOR CUFF REPAIR AND SUBACROMIAL DECOMPRESSION Left 06/30/2020   Procedure: LEFT SHOULDER ARTHROSCOPY DEBRIDEMENT, ACROMIOPLASTY, DISTAL CLAVICLE EXCISION,ROTATOR CUFF REPAIR, BICEPS TENODESIS;  Surgeon: Teryl Lucy, MD;  Location: [facility];  Service: Orthopedics;  Laterality: Left;  . TOTAL  HIP ARTHROPLASTY  08/15/2011   Procedure: TOTAL HIP ARTHROPLASTY;  Surgeon: Eulas Post;  Location: [facility];  Service: Orthopedics;  Laterality: Right;  . TOTAL HIP ARTHROPLASTY Left 02/07/2017  . TOTAL HIP ARTHROPLASTY Left 02/07/2017   Procedure: LEFT TOTAL HIP ARTHROPLASTY;  Surgeon: Teryl Lucy, MD;  Location: [facility];  Service: Orthopedics;  Laterality: Left;    There were no vitals filed for this visit.    Subjective Assessment - 09/04/20 1317    Subjective Pt reports her recovery following surgery has been painful.    Limitations House hold activities    How long can you sit comfortably? Needs a pillow for support    How long can you stand comfortably? No issue    How long can you walk comfortably? no issues    Patient Stated Goals To reach up above head  to take care of items in home    Currently in Pain? Yes    Pain Score 9     Pain Location Shoulder    Pain Orientation Left;Lateral    Pain Descriptors / Indicators Aching;Sharp;Tightness;Throbbing    Pain Type Acute pain;Surgical pain    Pain Radiating Towards L lateral shoulder    Pain Onset More than a month ago    Pain Frequency Constant    Aggravating Factors  Dressing- pulling up pants, bathing    Pain Relieving Factors Hot, cold, meds    Effect of Pain on Daily Activities Significant  impact              [facility] PT Assessment - 09/05/20 0001      Assessment   Medical Diagnosis L Cuff repair     Referring Provider (PT) Teryl Lucy, MD    Onset Date/Surgical Date 06/30/20    Hand Dominance Right    Next MD Visit 09/30/30    Prior Therapy No      Precautions   Precautions Shoulder    Precaution Comments Pt states she is to not lift, push or pull      Balance Screen   Has the patient fallen in the past 6 months No      Home Environment   Living Environment Private residence    Living Arrangements Alone    Type of Home Apartment    Home Access Level entry    Home Layout One level      Prior  Function   Level of Independence Independent    Vocation On disability    Leisure walks, games on tablet, puzzles, watches TV      Cognition   Overall Cognitive Status Within Functional Limits for tasks assessed      Observation/Other Assessments   Focus on Therapeutic Outcomes (FOTO)  NA      Sensation   Light Touch Appears Intact      Posture/Postural Control   Posture/Postural Control Postural limitations    Postural Limitations Rounded Shoulders      ROM / Strength   AROM / PROM / Strength PROM;Strength      PROM   PROM Assessment Site Shoulder    Right/Left Shoulder Right;Left    Right Shoulder Flexion 161 Degrees    Right Shoulder ABduction 138 Degrees    Right Shoulder Internal Rotation 62 Degrees    Right Shoulder External Rotation 68 Degrees    Left Shoulder Flexion 92 Degrees    Left Shoulder ABduction 66 Degrees    Left Shoulder Internal Rotation 47 Degrees    Left Shoulder External Rotation 26 Degrees      Strength   Overall Strength Comments pt is able to complete L UE motions below shoulder level. Specific MMT was deferred at this time       Transfers   Transfers Sit to Stand;Stand to Sit    Sit to Stand 7: Independent      Ambulation/Gait   Ambulation/Gait Yes    Ambulation/Gait Assistance 7: Independent    Gait Pattern Within Functional Limits;Step-through pattern                      Objective measurements completed on examination: See above findings.               PT Education - 09/05/20 0920    Education Details Eval findings, POC, HEP, use of cold pack for 10 mins after for pain and swelling management    Person(s) Educated Patient    Methods Explanation;Demonstration;Tactile cues;Verbal cues;Handout    Comprehension Verbalized understanding;Returned demonstration;Verbal cues required;Tactile cues required;Need further instruction            PT Short Term Goals - 09/05/20 0938      PT SHORT TERM GOAL #1   Title  Pt will be Ind in an initial HEP    Status New    Target Date 09/26/20      PT SHORT TERM GOAL #2   Title Pt will voice understanding of measures to reduce and manage her  L shoulder pain.    Status New    Target Date 09/26/20             PT Long Term Goals - 09/05/20 0940      PT LONG TERM GOAL #1   Title Improve L shoulder strength to 4+ to 5/5 for functional use with ADLs activites to shoulder height levels    Status New    Target Date 11/07/20      PT LONG TERM GOAL #2   Title Pt will demonstrate L shoulder AROMs to within 90% of the R shoulder for improved functiona use of the L UE    Status New    Target Date 11/07/20      PT LONG TERM GOAL #3   Title Pt will report a reduction of L shoulder pain to 4/10 or less with daily activities    Baseline 9/10    Status New    Target Date 11/07/20                  Plan - 09/05/20 1610    Clinical Impression Statement Pt presents to PT 8 weeks S/P L RC repair. Pt has mod to significant decreased AAROM for the L shoulder with pain/guarding limiting her motions. Pt was started on a HEP for AAROM exs. Pt will benefit from PT 2w8 for pain management, manual therapy, and ROM and strengthening exs as per protocol to maximize pt's functional use of her L shoulder.    Personal Factors and Comorbidities Comorbidity 3+;Time since onset of injury/illness/exacerbation;Fitness    Comorbidities OA, obesity, depression, bipolar ll    Examination-Activity Limitations Bathing;Carry;Dressing;Lift;Reach Overhead    Examination-Participation Restrictions Cleaning;Driving    Stability/Clinical Decision Making Stable/Uncomplicated    Clinical Decision Making Low    Rehab Potential Good    PT Frequency 2x / week    PT Duration 8 weeks    PT Treatment/Interventions ADLs/Self Care Home Management;Electrical Stimulation;Iontophoresis 4mg /ml Dexamethasone;Moist Heat;Traction;Ultrasound;Therapeutic exercise;Therapeutic activities;Patient/family  education;Manual techniques;Dry needling;Passive range of motion;Taping;Vasopneumatic Device;Joint Manipulations    PT Next Visit Plan Assess response to HEP, progress to isometrics for L shoulder, progress ther exas per protocol for [facility] repair    PT Home Exercise Plan [email or web address]    Consulted and Agree with Plan of Care Patient           Patient will benefit from skilled therapeutic intervention in order to improve the following deficits and impairments:  Decreased strength, Increased edema, Pain, Obesity, Impaired UE functional use, Decreased range of motion  Visit Diagnosis: Acute pain of left shoulder  S/P left rotator cuff repair  Decreased ROM of left shoulder  Muscle weakness (generalized)     Problem List Patient Active Problem List   Diagnosis Date Noted  . Encounter for screening for cervical cancer 07/15/2020  . Malodorous urine 07/15/2020  . Chronic GERD 03/19/2020  . Syncope 03/14/2020  . Dyspnea on exertion 03/11/2020  . Dizziness 03/11/2020  . Adhesive capsulitis of right shoulder 01/02/2020  . High risk social situation 03/26/2019  . Cramp in muscle 12/11/2018  . TSH deficiency 10/12/2018  . Encounter for screening mammogram for malignant neoplasm of breast 10/09/2018  . Unintentional weight loss 10/09/2018  . Prediabetes 10/08/2018  . Possible exposure to STD 06/13/2018  . Weight loss 06/13/2018  . Lower respiratory infection 12/21/2017  . Foot pain, bilateral 10/27/2017  . Routine screening for STI (sexually transmitted infection) 10/27/2017  . Gait instability 10/27/2017  . Lesion of stomach 09/07/2017  .  Nonspecific abnormal findings on imaging of lung 08/21/2017  . Mood disorder (HCC)   . Chronic pain syndrome   . Anxiety state 03/07/2017  . Primary localized osteoarthritis of left hip 02/07/2017  . Fibromyalgia 05/15/2014  . Colonoscopy refused 05/15/2014  . Healthcare maintenance 05/15/2014  . Schizoaffective disorder (HCC) 12/13/2012  . Other  and unspecified hyperlipidemia 12/13/2012  . Plantar fasciitis 04/16/2012  . Primary osteoarthritis of right hip 08/15/2011  . Obesity, morbid (HCC)   . Bipolar 2 disorder (HCC) 06/15/2011  . TOBACCO DEPENDENCE 11/30/2006  . OSTEOARTHRITIS OF SPINE, NOS 11/30/2006  . INCONTINENCE, URGE 11/30/2006    Joellyn RuedAllen Ralls MS, PT 09/05/20 6:04 PM  [facility] [address] [place], [place], [address] Phone: [phone]   Fax:  [phone]  Name: Margaretann Lovelesslice F Shiner MRN: [id number] Date of Birth: 03/25/1961   Check all possible CPT codes: 97110- Therapeutic Exercise, 97140 - Manual Therapy, 97530 - Therapeutic Activities, 97535 - Self Care, 97014 - Electrical stimulation (unattended), [id number] - Electrical stimulation (Manual), [id number] - Iontophoresis, [id number] - Ultrasound and [id number] - Vaso

## 2020-09-07 ENCOUNTER — Encounter (HOSPITAL_COMMUNITY): Payer: Self-pay | Admitting: Psychiatry

## 2020-09-07 NOTE — Progress Notes (Signed)
In Person In Office  I met with Bridget [name] on 82/80/03 at  2:30 PM EST in person, in office.  Location: Patient: OFFICE Provider: OFFICE  History of Present Illness: MDD and PTSD  Treatment Plan Goals: 1) Bridget [name] would like to process grief and loss experiences to decrease depressive symptoms. 2) Bridget [name] would like to process and heal from trauma experiences to decrease instances of trauma triggers/reactions with impact her daily functioning and fulfillment in relationships.  Observations/Objective: Counselor met with Bridget [name] for individual therapy in person in the office setting. Counselor assessed MH symptoms and progress on treatment plan goals, with patient reporting that she continues to experience lack of sleep and irritability, rouble concentrating due to pain from medical conditions. Counselor provided information on sleep hygiene and encouraged follow up care with medical providers to address pain.Bridget [name] presents withmoderatedepression and moderateanxiety. Bridget [name] denied suicidal ideation or self-harm behaviors.  Counselor prompted Bridget [name] to share progress on goals with Counselor utilizing MI interventions to assess and prompt motivation to change. Bridget [name] discussed trauma exposure, grief and loss issues and elements of daily functioning. Counselor and Bridget [name] discussed strategies in addressing current concerns using CBT skills. Bridget [name] verbally processed grief and loss related to former husbands attempts to increase communications and interactions with Bridget [name], as he ages and health is failing. Bridget [name] concerned about being taken advantage of and made plan to set boundaries to prevent unhealthy interactions. Bridget [name] to report back application of skills.   Assessment and Plan: Counselor will continue to meet with patient to address treatment plan goals. Patient will continue to follow recommendations of providers and implement skills learned in session.  Follow Up  Instructions: Counselor will send information for next session via e-mail.   The patient was advised to call back or seek an in-person evaluation if the symptoms worsen or if the condition fails to improve as anticipated.  I provided64mnutes of face-to-face time during this encounter.   BLise Auer LCSW

## 2020-09-11 ENCOUNTER — Ambulatory Visit: Payer: Medicaid Other

## 2020-09-15 ENCOUNTER — Other Ambulatory Visit: Payer: Self-pay

## 2020-09-15 ENCOUNTER — Ambulatory Visit (INDEPENDENT_AMBULATORY_CARE_PROVIDER_SITE_OTHER): Payer: Medicaid Other | Admitting: Psychiatry

## 2020-09-15 DIAGNOSIS — F4310 Post-traumatic stress disorder, unspecified: Secondary | ICD-10-CM

## 2020-09-15 DIAGNOSIS — F331 Major depressive disorder, recurrent, moderate: Secondary | ICD-10-CM

## 2020-09-16 ENCOUNTER — Encounter (HOSPITAL_COMMUNITY): Payer: Self-pay | Admitting: Psychiatry

## 2020-09-16 ENCOUNTER — Encounter: Payer: Medicaid Other | Admitting: Neurology

## 2020-09-16 ENCOUNTER — Ambulatory Visit: Payer: Medicaid Other

## 2020-09-16 NOTE — Progress Notes (Signed)
Virtual Visit via Telephone Note  I connected with Bridget [name] on 51/88/41 at  1:30 PM EST by telephone and verified that I am speaking with the correct person using two identifiers.  Location: Patient: HOME Provider: OFFICE  History of Present Illness: MDD and PTSD  Treatment Plan Goals: 1) Bridget [name] would like to process grief and loss experiences to decrease depressive symptoms. 2) Bridget [name] would like to process and heal from trauma experiences to decrease instances of trauma triggers/reactions with impact her daily functioning and fulfillment in relationships.  Observations/Objective: Counselor met with Client for individual therapy in person in the office setting. Counselor assessed MH symptoms and progress on treatment plan goals, with patient reporting that she has been experiencing anxiety, distress, anger and frustrations related to her health conditions and medical conditions. Client having difficult with daily tasks due to restless/painful sleep and difficulty with mobility. Client reports issues with loss of identity due to how she is having to function at a slower pace. Client presents withmoderatedepression and moderateanxiety. Client denied suicidal ideation or self-harm behaviors.  Counselorprompted client to share progress on goals with Counselor utilizing MI interventions to assess and prompt motivation to change. Client discussed trauma exposure, grief and loss issues and elements of daily functioning. Counselor and Client discussed strategies in addressing current concerns using CBT skills. Client verbally processed grief and loss related to Client's 2 funerals she is expected to attend this week. Client processed thoughts and feelings related to connections with individuals and ways she can do grief work for both in individualized ways. Client to report back application of skills.  Assessment and Plan: Counselor will continue to meet with patient to address  treatment plan goals. Patient will continue to follow recommendations of providers and implement skills learned in session.  Follow Up Instructions: Counselor will send information for next session via e-mail.   The patient was advised to call back or seek an in-person evaluation if the symptoms worsen or if the condition fails to improve as anticipated.  I provided33mnutes of face-to-face time during this encounter.   BLise Auer LCSW

## 2020-09-18 ENCOUNTER — Ambulatory Visit: Payer: Medicaid Other

## 2020-09-21 ENCOUNTER — Ambulatory Visit (INDEPENDENT_AMBULATORY_CARE_PROVIDER_SITE_OTHER): Payer: Medicaid Other | Admitting: Psychiatry

## 2020-09-21 ENCOUNTER — Other Ambulatory Visit: Payer: Self-pay

## 2020-09-21 ENCOUNTER — Encounter (HOSPITAL_COMMUNITY): Payer: Self-pay | Admitting: Psychiatry

## 2020-09-21 DIAGNOSIS — F331 Major depressive disorder, recurrent, moderate: Secondary | ICD-10-CM

## 2020-09-21 DIAGNOSIS — F4310 Post-traumatic stress disorder, unspecified: Secondary | ICD-10-CM

## 2020-09-21 NOTE — Progress Notes (Signed)
Virtual Visit via Video Note  I connected with Bridget [name] on 11/57/26 at  2:30 PM EST by a video enabled telemedicine application and verified that I am speaking with the correct person using two identifiers.  Location: Patient:HOME Provider:OFFICE  History of Present Illness: MDD and PTSD  Treatment Plan Goals: 1) [name] would like to process grief and loss experiences to decrease depressive symptoms. 2) [name] would like to process and heal from trauma experiences to decrease instances of trauma triggers/reactions with impact her daily functioning and fulfillment in relationships.  Observations/Objective: Counselor met with Client for individual therapy via webex. Counselor assessed MH symptoms and progress on treatment plan goals, with patient reportingthat she is recovering from being sick over the past week. Client reports when sick, sadness and loneliness are experienced as well. Client notes that she is recovering and feeling much better today, physically and mentally. Client presents withmoderatedepression and moderateanxiety. Client denied suicidal ideation or self-harm behaviors.  Counselorprompted client to share progress on goals with Counselor utilizing MI interventions to assess and prompt motivation to change. Client notes that she is dealing with grief and trauma reminders as her former partner is currently being hospitalized, she was unable to attend a family members funeral because she was sick and that another close friend is hospitalized as well. Counselor used TFCBT interventions to process thoughts and feelings, to determine needed behaviors or actions to cope in a healthy manner with triggers. Client engaged in discussion identifying helpful skills to apply. Client shared about positive cognitive coping used to deal with a familial issue. Client looking forward to spending time with family over the holiday weekend.   Assessment and Plan: Counselor will  continue to meet with patient to address treatment plan goals. Patient will continue to follow recommendations of providers and implement skills learned in session.  Follow Up Instructions: Counselor will send information for next session viae-mail.   The patient was advised to call back or seek an in-person evaluation if the symptoms worsen or if the condition fails to improve as anticipated.  I provided45 minutes of face-to-face time during this encounter.   [name], LCSW

## 2020-09-22 ENCOUNTER — Other Ambulatory Visit: Payer: Self-pay

## 2020-09-22 ENCOUNTER — Ambulatory Visit: Payer: Medicaid Other

## 2020-09-22 DIAGNOSIS — M6281 Muscle weakness (generalized): Secondary | ICD-10-CM

## 2020-09-22 DIAGNOSIS — R1310 Dysphagia, unspecified: Secondary | ICD-10-CM

## 2020-09-22 DIAGNOSIS — M25612 Stiffness of left shoulder, not elsewhere classified: Secondary | ICD-10-CM

## 2020-09-22 DIAGNOSIS — M25512 Pain in left shoulder: Secondary | ICD-10-CM | POA: Diagnosis not present

## 2020-09-22 DIAGNOSIS — Z9889 Other specified postprocedural states: Secondary | ICD-10-CM

## 2020-09-22 NOTE — Therapy (Signed)
[facility] Outpatient Rehabilitation [facility] [address] [place], [place], [address] Phone: [phone]   Fax:  [phone]  Physical Therapy Treatment  Patient Details  Name: Bridget [name] MRN: [id number] Date of Birth: 24-Nov-1960 Referring Provider (PT): Teryl Lucy, MD   Encounter Date: 09/22/2020   PT End of Session - 09/22/20 1449    Visit Number 2    Number of Visits 17    Date for PT Re-Evaluation 11/07/20    Authorization Type MEDICAID [place] ACCESS    Authorization - Visit Number 2    Authorization - Number of Visits 2    Progress Note Due on Visit 10    PT Start Time 1447    PT Stop Time 1533    PT Time Calculation (min) 46 min    Activity Tolerance Patient tolerated treatment well    Behavior During Therapy [facility] for tasks assessed/performed           Past Medical History:  Diagnosis Date  . Bipolar II disorder (HCC)   . Community acquired pneumonia of right upper lobe of lung 08/14/2017  . Depression   . GERD (gastroesophageal reflux disease)   . Near syncope   . Obesity, morbid (HCC)   . Osteoarthritis     Past Surgical History:  Procedure Laterality Date  . BICEPT TENODESIS Left 06/30/2020   Procedure: BICEPS TENODESIS;  Surgeon: Teryl Lucy, MD;  Location: [facility];  Service: Orthopedics;  Laterality: Left;  . BONE SPURS     REMOVED FROM SHOULDERS  . EYE SURGERY     STY REMOVED LEFT EYE  1992  . ROTATOR CUFF REPAIR     BILATERAL  . SHOULDER ARTHROSCOPY WITH DISTAL CLAVICLE RESECTION Left 06/30/2020   Procedure: SHOULDER ARTHROSCOPY WITH DISTAL CLAVICLE RESECTION;  Surgeon: Teryl Lucy, MD;  Location: [facility];  Service: Orthopedics;  Laterality: Left;  . SHOULDER ARTHROSCOPY WITH ROTATOR CUFF REPAIR AND SUBACROMIAL DECOMPRESSION Left 06/30/2020   Procedure: LEFT SHOULDER ARTHROSCOPY DEBRIDEMENT, ACROMIOPLASTY, DISTAL CLAVICLE EXCISION,ROTATOR CUFF REPAIR, BICEPS TENODESIS;  Surgeon:  Teryl Lucy, MD;  Location: [facility];  Service: Orthopedics;  Laterality: Left;  . TOTAL HIP ARTHROPLASTY  08/15/2011   Procedure: TOTAL HIP ARTHROPLASTY;  Surgeon: Eulas Post;  Location: [facility];  Service: Orthopedics;  Laterality: Right;  . TOTAL HIP ARTHROPLASTY Left 02/07/2017  . TOTAL HIP ARTHROPLASTY Left 02/07/2017   Procedure: LEFT TOTAL HIP ARTHROPLASTY;  Surgeon: Teryl Lucy, MD;  Location: [facility];  Service: Orthopedics;  Laterality: Left;    There were no vitals filed for this visit.   Subjective Assessment - 09/22/20 1548    Subjective Pt reports L shoulder pain at a high level.    Limitations House hold activities    How long can you sit comfortably? Needs a pillow for support    How long can you stand comfortably? No issue    How long can you walk comfortably? no issues    Patient Stated Goals To reach up above head  to take care of items in home    Currently in Pain? Yes    Pain Score 8     Pain Location Shoulder    Pain Orientation Left    Pain Descriptors / Indicators Aching;Sharp;Throbbing;Tightness    Pain Type Surgical pain;Chronic pain    Pain Radiating Towards L lateral shoulder    Pain Onset More than a month ago    Pain Frequency Constant    Aggravating Factors  Dressing- pulling up pants, bathing    Pain Relieving Factors Cold packs, meds    Effect of Pain on Daily Activities Significant impact              [facility] PT Assessment - 09/22/20 0001      PROM   Left Shoulder Flexion 115 Degrees   AAROM                        [facility] Adult PT Treatment/Exercise - 09/22/20 0001      Exercises   Exercises Shoulder      Shoulder Exercises: Supine   External Rotation AAROM;Left;10 reps   wand; 2 sets   Flexion AAROM;Left;10 reps   assist R hand; 2 sets     Shoulder Exercises: Pulleys   Flexion 3 minutes      Shoulder Exercises: ROM/Strengthening   Pendulum Flexion 20x    Other ROM/Strengthening Exercises AAROM  Flexion and ER 15x; ER to neutral      Shoulder Exercises: Isometric Strengthening   Flexion 5X5"    Extension 5X5"    External Rotation 5X5"    Internal Rotation 5X5"    ABduction 5X5"                  PT Education - 09/22/20 1553    Education Details HEP- additional AAROM exs and isometric exs were added to pt's HEP    Person(s) Educated Patient    Methods Explanation;Demonstration;Tactile cues;Verbal cues;Handout    Comprehension Verbalized understanding;Returned demonstration;Verbal cues required;Tactile cues required;Need further instruction            PT Short Term Goals - 09/22/20 1545      PT SHORT TERM GOAL #1   Title Pt will be Ind in an initial HEP. 09/22/20:On-going-Isometric exs initiated today. Pt needs furthe rinstruction    Status On-going    Target Date 09/22/20      PT SHORT TERM GOAL #2   Title Pt will voice understanding of measures to reduce and manage her L shoulder pain. Achieved-Pt is managing per cold packs and rest    Status Achieved    Target Date 09/22/20             PT Long Term Goals - 09/05/20 0940      PT LONG TERM GOAL #1   Title Improve L shoulder strength to 4+ to 5/5 for functional use with ADLs activites to shoulder height levels    Status New    Target Date 11/07/20      PT LONG TERM GOAL #2   Title Pt will demonstrate L shoulder AROMs to within 90% of the R shoulder for improved functiona use of the L UE    Status New    Target Date 11/07/20      PT LONG TERM GOAL #3   Title Pt will report a reduction of L shoulder pain to 4/10 or less with daily activities    Baseline 9/10    Status New    Target Date 11/07/20                 Plan - 09/22/20 1449    Clinical Impression Statement Pt's AAROM for L shoulder flexion was improved today vs. PROM flex on eval. New exs for AAROM and isometric strengthening were added today. Will follow up next visit to ensure proper completion of new exs. Pt is participating in  an appropriate rehab program for time frame  S/P RCT repair. Pt's L shoulder is progressing slower than anticipated due to delay with the initiation of PT. Pt will benefit from continued ROM and strengthening of the L shoulder to safely progress to a maximized level of function for the L shoulder/UE.    Personal Factors and Comorbidities Comorbidity 3+;Time since onset of injury/illness/exacerbation;Fitness    Comorbidities OA, obesity, depression, bipolar ll    Examination-Activity Limitations Bathing;Carry;Dressing;Lift;Reach Overhead    Examination-Participation Restrictions Cleaning;Driving    Clinical Decision Making Low    Rehab Potential Good    PT Frequency 2x / week    PT Duration 8 weeks    PT Treatment/Interventions ADLs/Self Care Home Management;Electrical Stimulation;Iontophoresis 4mg /ml Dexamethasone;Moist Heat;Traction;Ultrasound;Therapeutic exercise;Therapeutic activities;Patient/family education;Manual techniques;Dry needling;Passive range of motion;Taping;Vasopneumatic Device;Joint Manipulations    PT Next Visit Plan Assess response to HEP, progress to isometrics for L shoulder, progress ther exas per protocol for [facility] repair    PT Home Exercise Plan [id number]    Consulted and Agree with Plan of Care Patient           Patient will benefit from skilled therapeutic intervention in order to improve the following deficits and impairments:  Decreased strength,Increased edema,Pain,Obesity,Impaired UE functional use,Decreased range of motion  Visit Diagnosis: Acute pain of left shoulder  S/P left rotator cuff repair  Decreased ROM of left shoulder  Muscle weakness (generalized)  Dysphagia, unspecified type     Problem List Patient Active Problem List   Diagnosis Date Noted  . Encounter for screening for cervical cancer 07/15/2020  . Malodorous urine 07/15/2020  . Chronic GERD 03/19/2020  . Syncope 03/14/2020  . Dyspnea on exertion 03/11/2020  . Dizziness 03/11/2020   . Adhesive capsulitis of right shoulder 01/02/2020  . High risk social situation 03/26/2019  . Cramp in muscle 12/11/2018  . TSH deficiency 10/12/2018  . Encounter for screening mammogram for malignant neoplasm of breast 10/09/2018  . Unintentional weight loss 10/09/2018  . Prediabetes 10/08/2018  . Possible exposure to STD 06/13/2018  . Weight loss 06/13/2018  . Lower respiratory infection 12/21/2017  . Foot pain, bilateral 10/27/2017  . Routine screening for STI (sexually transmitted infection) 10/27/2017  . Gait instability 10/27/2017  . Lesion of stomach 09/07/2017  . Nonspecific abnormal findings on imaging of lung 08/21/2017  . Mood disorder (HCC)   . Chronic pain syndrome   . Anxiety state 03/07/2017  . Primary localized osteoarthritis of left hip 02/07/2017  . Fibromyalgia 05/15/2014  . Colonoscopy refused 05/15/2014  . Healthcare maintenance 05/15/2014  . Schizoaffective disorder (HCC) 12/13/2012  . Other and unspecified hyperlipidemia 12/13/2012  . Plantar fasciitis 04/16/2012  . Primary osteoarthritis of right hip 08/15/2011  . Obesity, morbid (HCC)   . Bipolar 2 disorder (HCC) 06/15/2011  . TOBACCO DEPENDENCE 11/30/2006  . OSTEOARTHRITIS OF SPINE, NOS 11/30/2006  . INCONTINENCE, URGE 11/30/2006   12/02/2006 MS, PT 09/22/20 4:21 PM  [facility] [address] [place], [place], [place] Phone: [phone]   Fax:  [phone]  Name: KATONYA BLECHER MRN: Margaretann Loveless Date of Birth: 1960-10-15

## 2020-09-24 ENCOUNTER — Ambulatory Visit: Payer: Medicaid Other

## 2020-09-29 ENCOUNTER — Other Ambulatory Visit: Payer: Self-pay

## 2020-09-29 ENCOUNTER — Ambulatory Visit (INDEPENDENT_AMBULATORY_CARE_PROVIDER_SITE_OTHER): Payer: Medicaid Other | Admitting: Psychiatry

## 2020-09-29 ENCOUNTER — Encounter (HOSPITAL_COMMUNITY): Payer: Self-pay | Admitting: Psychiatry

## 2020-09-29 DIAGNOSIS — F4310 Post-traumatic stress disorder, unspecified: Secondary | ICD-10-CM | POA: Diagnosis not present

## 2020-09-29 DIAGNOSIS — F331 Major depressive disorder, recurrent, moderate: Secondary | ICD-10-CM | POA: Diagnosis not present

## 2020-09-29 NOTE — Progress Notes (Signed)
Virtual Visit via Video Note  I connected with [name] on 49/49/44 at  1:30 PM EST by a video enabled telemedicine application and verified that I am speaking with the correct person using two identifiers.  Location: Patient:HOME Provider:OFFICE  History of Present Illness: MDD and PTSD  Treatment Plan Goals: 1) Maleeha would like to process grief and loss experiences to decrease depressive symptoms. 2) Elexa would like to process and heal from trauma experiences to decrease instances of trauma triggers/reactions with impact her daily functioning and fulfillment in relationships.  Observations/Objective: Counselor met with Client for individual therapy via webex. Counselor assessed MH symptoms and progress on treatment plan goals, with patient reportingthatshe is feeling better this week, staying in more and resting to prevent more physical health issues. Client presents withmoderatedepression and moderateanxiety. Client denied suicidal ideation or self-harm behaviors.  Counselorprompted client to share progress on goals with Counselor utilizing MI interventions to assess and prompt motivation to change. Client updated Counselor on skill application, boundary setting and family dynamics. Counselor and Client role played communication skills to use with family members to reduce anxiety and trauma exposure. Client to report back outcomes at next session.  Assessment and Plan: Counselor will continue to meet with patient to address treatment plan goals. Patient will continue to follow recommendations of providers and implement skills learned in session.  Follow Up Instructions: Counselor will send information for next session viae-mail.   The patient was advised to call back or seek an in-person evaluation if the symptoms worsen or if the condition fails to improve as anticipated.  I provided45 minutes of face-to-face time during this encounter.   [name], LCSW

## 2020-10-01 ENCOUNTER — Ambulatory Visit: Payer: Medicaid Other

## 2020-10-05 ENCOUNTER — Telehealth: Payer: Self-pay

## 2020-10-05 ENCOUNTER — Ambulatory Visit: Payer: Medicaid Other | Attending: Family Medicine

## 2020-10-05 DIAGNOSIS — M25512 Pain in left shoulder: Secondary | ICD-10-CM | POA: Insufficient documentation

## 2020-10-05 DIAGNOSIS — M25612 Stiffness of left shoulder, not elsewhere classified: Secondary | ICD-10-CM | POA: Insufficient documentation

## 2020-10-05 DIAGNOSIS — M6281 Muscle weakness (generalized): Secondary | ICD-10-CM | POA: Insufficient documentation

## 2020-10-05 DIAGNOSIS — Z9889 Other specified postprocedural states: Secondary | ICD-10-CM | POA: Insufficient documentation

## 2020-10-05 NOTE — Telephone Encounter (Signed)
Patient states she forgot about today's scheduled appointment. Confirmed that she plans to attend next scheduled appointment.

## 2020-10-06 ENCOUNTER — Ambulatory Visit (HOSPITAL_COMMUNITY): Payer: Medicaid Other | Admitting: Psychiatry

## 2020-10-06 ENCOUNTER — Other Ambulatory Visit: Payer: Self-pay

## 2020-10-08 ENCOUNTER — Other Ambulatory Visit: Payer: Self-pay

## 2020-10-08 ENCOUNTER — Ambulatory Visit: Payer: Medicaid Other

## 2020-10-08 DIAGNOSIS — M25512 Pain in left shoulder: Secondary | ICD-10-CM

## 2020-10-08 DIAGNOSIS — M25612 Stiffness of left shoulder, not elsewhere classified: Secondary | ICD-10-CM | POA: Diagnosis present

## 2020-10-08 DIAGNOSIS — M6281 Muscle weakness (generalized): Secondary | ICD-10-CM | POA: Diagnosis present

## 2020-10-08 DIAGNOSIS — Z9889 Other specified postprocedural states: Secondary | ICD-10-CM

## 2020-10-08 NOTE — Therapy (Signed)
[place], [place], [address] Phone: [phone]   Fax:  [phone]  Physical Therapy Treatment/Progress Note  Patient Details  Name: Bridget [name] MRN: [id number] Date of Birth: 02-17-1961 Referring Provider (PT): Marchia Bond, MD   Encounter Date: 10/08/2020   PT End of Session - 10/08/20 1505    Visit Number 3    Number of Visits 15    Date for PT Re-Evaluation 11/19/20    Authorization Type MEDICAID [place] ACCESS    Authorization Time Period requesting additional auth    Authorization - Visit Number 2    Authorization - Number of Visits 2    Progress Note Due on Visit 10    PT Start Time 1501    PT Stop Time 1540    PT Time Calculation (min) 39 min    Activity Tolerance Patient tolerated treatment well    Behavior During Therapy [facility] for tasks assessed/performed           Past Medical History:  Diagnosis Date  . Bipolar II disorder ([place])   . Community acquired pneumonia of right upper lobe of lung 08/14/2017  . Depression   . GERD (gastroesophageal reflux disease)   . Near syncope   . Obesity, morbid ([place])   . Osteoarthritis     Past Surgical History:  Procedure Laterality Date  . BICEPT TENODESIS Left 06/30/2020   Procedure: BICEPS TENODESIS;  Surgeon: Marchia Bond, MD;  Location: [place];  Service: Orthopedics;  Laterality: Left;  . BONE SPURS     REMOVED FROM SHOULDERS  . EYE SURGERY     STY REMOVED LEFT EYE  1992  . ROTATOR CUFF REPAIR     BILATERAL  . SHOULDER ARTHROSCOPY WITH DISTAL CLAVICLE RESECTION Left 06/30/2020   Procedure: SHOULDER ARTHROSCOPY WITH DISTAL CLAVICLE RESECTION;  Surgeon: Marchia Bond, MD;  Location: [place];  Service: Orthopedics;  Laterality: Left;  . SHOULDER ARTHROSCOPY WITH ROTATOR CUFF REPAIR AND SUBACROMIAL DECOMPRESSION Left 06/30/2020   Procedure: LEFT SHOULDER ARTHROSCOPY DEBRIDEMENT, ACROMIOPLASTY, DISTAL  CLAVICLE EXCISION,ROTATOR CUFF REPAIR, BICEPS TENODESIS;  Surgeon: Marchia Bond, MD;  Location: [place];  Service: Orthopedics;  Laterality: Left;  . TOTAL HIP ARTHROPLASTY  08/15/2011   Procedure: TOTAL HIP ARTHROPLASTY;  Surgeon: Johnny Bridge;  Location: [place];  Service: Orthopedics;  Laterality: Right;  . TOTAL HIP ARTHROPLASTY Left 02/07/2017  . TOTAL HIP ARTHROPLASTY Left 02/07/2017   Procedure: LEFT TOTAL HIP ARTHROPLASTY;  Surgeon: Marchia Bond, MD;  Location: [place];  Service: Orthopedics;  Laterality: Left;    There were no vitals filed for this visit.   Subjective Assessment - 10/08/20 1503    Subjective Patient reports the shoulder has been feeling stiff. Patient reports she has f/u with surgeon on 11/12/19.    Limitations House hold activities    How long can you sit comfortably? Needs a pillow for support    How long can you stand comfortably? No issue    How long can you walk comfortably? no issues    Patient Stated Goals To reach up above head  to take care of items in home    Currently in Pain? Yes    Pain Score 8     Pain Location Shoulder    Pain Orientation Left    Pain Descriptors / Indicators Aching;Stabbing    Pain Type Surgical pain    Pain Onset More than a month ago  [facility] PT Assessment - 10/08/20 0001      AROM   Left Shoulder Flexion 85 Degrees    Left Shoulder ABduction 50 Degrees      PROM   Left Shoulder Flexion 130 Degrees   empty end feel   Left Shoulder ABduction 80 Degrees   empty end feel   Left Shoulder Internal Rotation 60 Degrees   empty end feel   Left Shoulder External Rotation 30 Degrees   empty end feel     Strength   Overall Strength Comments not assessed due to AROM limitations                         [facility] Adult PT Treatment/Exercise - 10/08/20 0001      Shoulder Exercises: Supine   External Rotation Limitations AAROM with dowel 1 x 10    Flexion Limitations AAROM with dowel 1  x 10    ABduction Limitations AAROM with dowel 1 x 10    Other Supine Exercises chest press with dowel 1 x 10      Manual Therapy   Manual therapy comments PROM to Lt shoulder in all planes, Grade II-III Lt GHJ mobilizations A/P, inferior                  PT Education - 10/08/20 1534    Education Details Education on overall progress. POC.    Person(s) Educated Patient    Methods Explanation    Comprehension Verbalized understanding            PT Short Term Goals - 10/08/20 1530      PT SHORT TERM GOAL #1   Title Pt will be Ind in an initial HEP.    Status Achieved    Target Date 09/22/20      PT SHORT TERM GOAL #2   Title Pt will voice understanding of measures to reduce and manage her L shoulder pain. Achieved-Pt is managing per cold packs and rest    Status Achieved    Target Date 09/22/20             PT Long Term Goals - 10/08/20 1531      PT LONG TERM GOAL #1   Title Improve L shoulder strength to 4+ to 5/5 for functional use with ADLs activites to shoulder height levels    Status Deferred    Target Date 11/19/20      PT LONG TERM GOAL #2   Title Pt will demonstrate L shoulder AROMs to within 90% of the R shoulder for improved functiona use of the L UE    Status On-going    Target Date 11/19/20      PT LONG TERM GOAL #3   Title Pt will report a reduction of L shoulder pain to 4/10 or less with daily activities    Baseline 8/10    Status On-going    Target Date 11/19/20                 Plan - 10/08/20 1523    Clinical Impression Statement Patient has attended 2 PT sessions since start of care on 09/04/20 s/p Lt RCR on 06/30/20. Her progress is slower than anticipated at this time, which could be in part due to lack of PT sessions over the past month secondary to patient cancelling appointments due to being sick. Her ROM has improved since initial evaluation, though she remains significantly limited in P/AROM at this time  reporting severe pain  at available end range of all motions. While her subjective reports of pain are high, she is in no obvious distress throughout session. She will benefit from continued PT to restore ROM and strength in Lt shoulder in order to return to optimal function.    Personal Factors and Comorbidities Comorbidity 3+;Time since onset of injury/illness/exacerbation;Fitness    Comorbidities OA, obesity, depression, bipolar ll    Examination-Activity Limitations Bathing;Carry;Dressing;Lift;Reach Overhead    Examination-Participation Restrictions Cleaning;Driving    Rehab Potential Good    PT Frequency 2x / week    PT Duration 6 weeks    PT Treatment/Interventions ADLs/Self Care Home Management;Electrical Stimulation;Iontophoresis 4mg /ml Dexamethasone;Moist Heat;Traction;Ultrasound;Therapeutic exercise;Therapeutic activities;Patient/family education;Manual techniques;Dry needling;Passive range of motion;Taping;Vasopneumatic Device;Joint Manipulations    PT Next Visit Plan Progress ROM as tolerated. Progress isometrics. Update HEP.    PT Home Exercise Plan [id number]    Consulted and Agree with Plan of Care Patient          Check all possible CPT codes: - Therapeutic Exercise, [phone]- Neuro Re-education, 97140 - Manual Therapy, 97530 - Therapeutic Activities, [phone] - Mechanical traction, 97014 - Electrical stimulation (unattended), [phone] - Iontophoresis and 97016 - Vaso        Patient will benefit from skilled therapeutic intervention in order to improve the following deficits and impairments:  Decreased strength,Increased edema,Pain,Obesity,Impaired UE functional use,Decreased range of motion  Visit Diagnosis: Acute pain of left shoulder  S/P left rotator cuff repair  Decreased ROM of left shoulder  Muscle weakness (generalized)     Problem List Patient Active Problem List   Diagnosis Date Noted  . Encounter for screening for cervical cancer 07/15/2020  . Malodorous urine 07/15/2020  .  Chronic GERD 03/19/2020  . Syncope 03/14/2020  . Dyspnea on exertion 03/11/2020  . Dizziness 03/11/2020  . Adhesive capsulitis of right shoulder 01/02/2020  . High risk social situation 03/26/2019  . Cramp in muscle 12/11/2018  . TSH deficiency 10/12/2018  . Encounter for screening mammogram for malignant neoplasm of breast 10/09/2018  . Unintentional weight loss 10/09/2018  . Prediabetes 10/08/2018  . Possible exposure to STD 06/13/2018  . Weight loss 06/13/2018  . Lower respiratory infection 12/21/2017  . Foot pain, bilateral 10/27/2017  . Routine screening for STI (sexually transmitted infection) 10/27/2017  . Gait instability 10/27/2017  . Lesion of stomach 09/07/2017  . Nonspecific abnormal findings on imaging of lung 08/21/2017  . Mood disorder (HCC)   . Chronic pain syndrome   . Anxiety state 03/07/2017  . Primary localized osteoarthritis of left hip 02/07/2017  . Fibromyalgia 05/15/2014  . Colonoscopy refused 05/15/2014  . Healthcare maintenance 05/15/2014  . Schizoaffective disorder (HCC) 12/13/2012  . Other and unspecified hyperlipidemia 12/13/2012  . Plantar fasciitis 04/16/2012  . Primary osteoarthritis of right hip 08/15/2011  . Obesity, morbid (HCC)   . Bipolar 2 disorder (HCC) 06/15/2011  . TOBACCO DEPENDENCE 11/30/2006  . OSTEOARTHRITIS OF SPINE, NOS 11/30/2006  . INCONTINENCE, URGE 11/30/2006   12/02/2006, PT, DPT, ATC 10/08/20 3:47 PM  [facility] [address] [place], [place], [place] Phone: [phone]   Fax:  [phone]  Name: Bridget [name] MRN: Margaretann Loveless Date of Birth: 03/16/1961

## 2020-10-12 ENCOUNTER — Ambulatory Visit: Payer: Medicaid Other

## 2020-10-12 ENCOUNTER — Other Ambulatory Visit: Payer: Self-pay

## 2020-10-12 DIAGNOSIS — Z9889 Other specified postprocedural states: Secondary | ICD-10-CM

## 2020-10-12 DIAGNOSIS — M25612 Stiffness of left shoulder, not elsewhere classified: Secondary | ICD-10-CM

## 2020-10-12 DIAGNOSIS — M25512 Pain in left shoulder: Secondary | ICD-10-CM

## 2020-10-12 DIAGNOSIS — M6281 Muscle weakness (generalized): Secondary | ICD-10-CM

## 2020-10-12 NOTE — Therapy (Addendum)
[facility] [address] [place], [place], [address] Phone: [phone]   Fax:  [phone]  Physical Therapy Treatment  Patient Details  Name: Bridget [name] MRN: [id number] Date of Birth: [birth year] Referring Provider (PT): Teryl Lucy, MD   Encounter Date: 10/12/2020   PT End of Session - 10/12/20 1435    Visit Number 4    Number of Visits 15    Date for PT Re-Evaluation 11/19/20    Authorization Type MEDICAID [place] ACCESS    Authorization Time Period 10/12/20-11/01/20    Authorization - Visit Number 1    Authorization - Number of Visits 3    PT Start Time 1410    PT Stop Time 1455    PT Time Calculation (min) 45 min    Activity Tolerance Patient tolerated treatment well    Behavior During Therapy [facility] for tasks assessed/performed           Past Medical History:  Diagnosis Date  . Bipolar II disorder (HCC)   . Community acquired pneumonia of right upper lobe of lung 08/14/2017  . Depression   . GERD (gastroesophageal reflux disease)   . Near syncope   . Obesity, morbid (HCC)   . Osteoarthritis     Past Surgical History:  Procedure Laterality Date  . BICEPT TENODESIS Left 06/30/2020   Procedure: BICEPS TENODESIS;  Surgeon: Teryl Lucy, MD;  Location: [facility];  Service: Orthopedics;  Laterality: Left;  . BONE SPURS     REMOVED FROM SHOULDERS  . EYE SURGERY     STY REMOVED LEFT EYE  1992  . ROTATOR CUFF REPAIR     BILATERAL  . SHOULDER ARTHROSCOPY WITH DISTAL CLAVICLE RESECTION Left 06/30/2020   Procedure: SHOULDER ARTHROSCOPY WITH DISTAL CLAVICLE RESECTION;  Surgeon: Teryl Lucy, MD;  Location: [facility];  Service: Orthopedics;  Laterality: Left;  . SHOULDER ARTHROSCOPY WITH ROTATOR CUFF REPAIR AND SUBACROMIAL DECOMPRESSION Left 06/30/2020   Procedure: LEFT SHOULDER ARTHROSCOPY DEBRIDEMENT, ACROMIOPLASTY, DISTAL CLAVICLE EXCISION,ROTATOR CUFF REPAIR, BICEPS TENODESIS;   Surgeon: Teryl Lucy, MD;  Location: [facility];  Service: Orthopedics;  Laterality: Left;  . TOTAL HIP ARTHROPLASTY  08/15/2011   Procedure: TOTAL HIP ARTHROPLASTY;  Surgeon: Eulas Post;  Location: [facility];  Service: Orthopedics;  Laterality: Right;  . TOTAL HIP ARTHROPLASTY Left 02/07/2017  . TOTAL HIP ARTHROPLASTY Left 02/07/2017   Procedure: LEFT TOTAL HIP ARTHROPLASTY;  Surgeon: Teryl Lucy, MD;  Location: [facility];  Service: Orthopedics;  Laterality: Left;    There were no vitals filed for this visit.   Subjective Assessment - 10/12/20 1411    Subjective My back hurts a little bit trying to run to catch the bus. She is completing ROM exercises three times daily and can tell a difference with her mobility mostly when she is bathing.    Currently in Pain? Yes    Pain Score 9     Pain Location Shoulder    Pain Orientation Left    Pain Descriptors / Indicators Aching    Pain Type Surgical pain                                     PT Education - 10/12/20 1442    Education Details Education on updated HEP.    Person(s) Educated Patient    Methods Explanation;Demonstration;Handout    Comprehension Verbalized understanding;Returned demonstration  PT Short Term Goals - 10/08/20 1530      PT SHORT TERM GOAL #1   Title Pt will be Ind in an initial HEP.    Status Achieved    Target Date 09/22/20      PT SHORT TERM GOAL #2   Title Pt will voice understanding of measures to reduce and manage her L shoulder pain. Achieved-Pt is managing per cold packs and rest    Status Achieved    Target Date 09/22/20             PT Long Term Goals - 10/08/20 1531      PT LONG TERM GOAL #1   Title Improve L shoulder strength to 4+ to 5/5 for functional use with ADLs activites to shoulder height levels    Status Deferred    Target Date 11/19/20      PT LONG TERM GOAL #2   Title Pt will demonstrate L shoulder AROMs to within 90% of  the R shoulder for improved functiona use of the L UE    Status On-going    Target Date 11/19/20      PT LONG TERM GOAL #3   Title Pt will report a reduction of L shoulder pain to 4/10 or less with daily activities    Baseline 8/10    Status On-going    Target Date 11/19/20                 Plan - 10/12/20 1437    Clinical Impression Statement Patient reports high subjective pain level at beginning of session, though in no obvious distress throughout session. Fair tolerance to PROM with patient reporting pain at end range with empty feel in all planes with most notable limitation into ER. Patient has improved tolerance to AAROM compared to PROM achieving further ROM. Patient required cues to decrease excessive upper trap engagement with periscapular strengthening.    Personal Factors and Comorbidities Comorbidity 3+;Time since onset of injury/illness/exacerbation;Fitness    Comorbidities OA, obesity, depression, bipolar ll    Examination-Activity Limitations Bathing;Carry;Dressing;Lift;Reach Overhead    Examination-Participation Restrictions Cleaning;Driving    Rehab Potential Good    PT Frequency 2x / week    PT Duration 6 weeks    PT Treatment/Interventions ADLs/Self Care Home Management;Electrical Stimulation;Iontophoresis 4mg /ml Dexamethasone;Moist Heat;Traction;Ultrasound;Therapeutic exercise;Therapeutic activities;Patient/family education;Manual techniques;Dry needling;Passive range of motion;Taping;Vasopneumatic Device;Joint Manipulations    PT Next Visit Plan Progress ROM as tolerated. Progress rotator isometrics. Progress periscapular strength. Continue manual therapy.    PT Home Exercise Plan [id number]    Consulted and Agree with Plan of Care Patient           Patient will benefit from skilled therapeutic intervention in order to improve the following deficits and impairments:  Decreased strength,Increased edema,Pain,Obesity,Impaired UE functional use,Decreased range of  motion  Visit Diagnosis: Acute pain of left shoulder  S/P left rotator cuff repair  Decreased ROM of left shoulder  Muscle weakness (generalized)     Problem List Patient Active Problem List   Diagnosis Date Noted  . Encounter for screening for cervical cancer 07/15/2020  . Malodorous urine 07/15/2020  . Chronic GERD 03/19/2020  . Syncope 03/14/2020  . Dyspnea on exertion 03/11/2020  . Dizziness 03/11/2020  . Adhesive capsulitis of right shoulder 01/02/2020  . High risk social situation 03/26/2019  . Cramp in muscle 12/11/2018  . TSH deficiency 10/12/2018  . Encounter for screening mammogram for malignant neoplasm of breast 10/09/2018  . Unintentional weight loss 10/09/2018  .  Prediabetes 10/08/2018  . Possible exposure to STD 06/13/2018  . Weight loss 06/13/2018  . Lower respiratory infection 12/21/2017  . Foot pain, bilateral 10/27/2017  . Routine screening for STI (sexually transmitted infection) 10/27/2017  . Gait instability 10/27/2017  . Lesion of stomach 09/07/2017  . Nonspecific abnormal findings on imaging of lung 08/21/2017  . Mood disorder (HCC)   . Chronic pain syndrome   . Anxiety state 03/07/2017  . Primary localized osteoarthritis of left hip 02/07/2017  . Fibromyalgia 05/15/2014  . Colonoscopy refused 05/15/2014  . Healthcare maintenance 05/15/2014  . Schizoaffective disorder (HCC) 12/13/2012  . Other and unspecified hyperlipidemia 12/13/2012  . Plantar fasciitis 04/16/2012  . Primary osteoarthritis of right hip 08/15/2011  . Obesity, morbid (HCC)   . Bipolar 2 disorder (HCC) 06/15/2011  . TOBACCO DEPENDENCE 11/30/2006  . OSTEOARTHRITIS OF SPINE, NOS 11/30/2006  . INCONTINENCE, URGE 11/30/2006  [name] Libra, PT, DPT, ATC 10/13/20 9:21 AM  [facility] [address] [place], [place], [address] Phone: [phone]   Fax:  [phone]  Name: Bridget [name] MRN: [id number] Date of Birth:  Mar 01, 1961

## 2020-10-13 ENCOUNTER — Ambulatory Visit (INDEPENDENT_AMBULATORY_CARE_PROVIDER_SITE_OTHER): Payer: Medicaid Other | Admitting: Psychiatry

## 2020-10-13 ENCOUNTER — Encounter (HOSPITAL_COMMUNITY): Payer: Self-pay | Admitting: Psychiatry

## 2020-10-13 DIAGNOSIS — F4310 Post-traumatic stress disorder, unspecified: Secondary | ICD-10-CM | POA: Diagnosis not present

## 2020-10-13 DIAGNOSIS — F331 Major depressive disorder, recurrent, moderate: Secondary | ICD-10-CM | POA: Diagnosis not present
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
## 2020-10-15 ENCOUNTER — Ambulatory Visit: Payer: Medicaid Other

## 2020-10-19 ENCOUNTER — Ambulatory Visit: Payer: Medicaid Other

## 2020-10-20 ENCOUNTER — Encounter (HOSPITAL_COMMUNITY): Payer: Self-pay | Admitting: Psychiatry

## 2020-10-20 ENCOUNTER — Other Ambulatory Visit: Payer: Self-pay

## 2020-10-20 ENCOUNTER — Ambulatory Visit (INDEPENDENT_AMBULATORY_CARE_PROVIDER_SITE_OTHER): Payer: Medicaid Other | Admitting: Psychiatry

## 2020-10-20 DIAGNOSIS — F4310 Post-traumatic stress disorder, unspecified: Secondary | ICD-10-CM
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
## 2020-10-22 ENCOUNTER — Ambulatory Visit: Payer: Medicaid Other

## 2020-10-27 ENCOUNTER — Ambulatory Visit (INDEPENDENT_AMBULATORY_CARE_PROVIDER_SITE_OTHER): Payer: Medicaid Other | Admitting: Psychiatry

## 2020-10-27 ENCOUNTER — Encounter (HOSPITAL_COMMUNITY): Payer: Self-pay | Admitting: Psychiatry

## 2020-10-27 ENCOUNTER — Other Ambulatory Visit: Payer: Self-pay

## 2020-10-27 DIAGNOSIS — F4310 Post-traumatic stress disorder, unspecified: Secondary | ICD-10-CM | POA: Diagnosis not present

## 2020-10-27 DIAGNOSIS — F331 Major depressive disorder, recurrent, moderate: Secondary | ICD-10-CM | POA: Diagnosis not present
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
## 2020-10-28 ENCOUNTER — Other Ambulatory Visit: Payer: Self-pay

## 2020-10-28 ENCOUNTER — Ambulatory Visit: Payer: Medicaid Other | Admitting: Neurology

## 2020-10-28 ENCOUNTER — Ambulatory Visit: Payer: Medicaid Other

## 2020-10-28 DIAGNOSIS — R202 Paresthesia of skin: Secondary | ICD-10-CM

## 2020-10-28 NOTE — Procedures (Signed)
[facility]  [address] [place], [address]  [place], [place] [address] Tel: [phone] Fax:  [phone] Test Date:  10/28/2020  Patient: Bridget [name] DOB: [birth year] Physician: Nita Sickle, DO  Sex: Female Height: 5\' 1"  Ref Phys: , MD  ID#: Teryl Lucy   Technician:    Patient Complaints: This is a 60 year old female referred for evaluation of left hand paresthesias.  NCV & EMG Findings: Extensive electrodiagnostic testing of the left upper extremity shows: 1. Left median, mixed palmar, and ulnar sensory responses are within normal limits 2. Left median and ulnar motor responses are within normal limits. 3. There is no evidence of active or chronic motor axonal loss changes affecting any of the tested muscles.  Motor unit configuration and recruitment pattern is within normal limits.  Impression: This is a normal study of the left upper extremity.  In particular, there is no evidence of carpal tunnel syndrome or cervical radiculopathy.   ___________________________ 41, DO    Nerve Conduction Studies Anti Sensory Summary Table   Stim Site NR Peak (ms) Norm Peak (ms) P-T Amp (V) Norm P-T Amp  Left Median Anti Sensory (2nd Digit)  34C  Wrist    3.2 <3.6 40.2 >15  Left Ulnar Anti Sensory (5th Digit)  34C  Wrist    2.9 <3.1 28.8 >10   Motor Summary Table   Stim Site NR Onset (ms) Norm Onset (ms) O-P Amp (mV) Norm O-P Amp Site1 Site2 Delta-0 (ms) Dist (cm) Vel (m/s) Norm Vel (m/s)  Left Median Motor (Abd Poll Brev)  34C  Wrist    3.2 <4.0 8.2 >6 Elbow Wrist 4.4 28.0 64 >50  Elbow    7.6  7.1         Left Ulnar Motor (Abd Dig Minimi)  34C  Wrist    2.4 <3.1 10.2 >7 B Elbow Wrist 3.4 22.0 65 >50  B Elbow    5.8  10.0  A Elbow B Elbow 1.5 10.0 67 >50  A Elbow    7.3  9.4          Comparison Summary Table   Stim Site NR Peak (ms) Norm Peak (ms) P-T Amp (V) Site1 Site2 Delta-P (ms) Norm Delta (ms)  Left Median/Ulnar Palm Comparison  (Wrist - 8cm)  34C  Median Palm    1.9 <2.2 52.7 Median Palm Ulnar Palm 0.2   Ulnar Palm    1.7 <2.2 24.4       EMG   Side Muscle Ins Act Fibs Psw Fasc Number Recrt Dur Dur. Amp Amp. Poly Poly. Comment  Left 1stDorInt Nml Nml Nml Nml Nml Nml Nml Nml Nml Nml Nml Nml N/A  Left PronatorTeres Nml Nml Nml Nml Nml Nml Nml Nml Nml Nml Nml Nml N/A  Left Biceps Nml Nml Nml Nml Nml Nml Nml Nml Nml Nml Nml Nml N/A  Left Triceps Nml Nml Nml Nml Nml Nml Nml Nml Nml Nml Nml Nml N/A  Left Deltoid Nml Nml Nml Nml Nml Nml Nml Nml Nml Nml Nml Nml N/A      Waveforms:

## 2020-10-30 ENCOUNTER — Other Ambulatory Visit: Payer: Self-pay

## 2020-10-30 ENCOUNTER — Ambulatory Visit: Payer: Medicaid Other

## 2020-10-30 DIAGNOSIS — M6281 Muscle weakness (generalized): Secondary | ICD-10-CM

## 2020-10-30 DIAGNOSIS — M25512 Pain in left shoulder: Secondary | ICD-10-CM | POA: Diagnosis not present

## 2020-10-30 DIAGNOSIS — M25612 Stiffness of left shoulder, not elsewhere classified: Secondary | ICD-10-CM

## 2020-10-30 DIAGNOSIS — Z9889 Other specified postprocedural states: Secondary | ICD-10-CM

## 2020-10-30 NOTE — Therapy (Signed)
[facility] Outpatient Rehabilitation [facility] [address] [place], [place], [address] Phone: [phone]   Fax:  [phone]  Physical Therapy Treatment  Patient Details  Name: Bridget [name] MRN: [id number] Date of Birth: 1960-11-10 Referring Provider (PT): Teryl Lucy, MD   Encounter Date: 10/30/2020   PT End of Session - 10/30/20 1143    Visit Number 5    Number of Visits 15    Date for PT Re-Evaluation 11/19/20    Authorization Type MEDICAID [place] ACCESS    Authorization Time Period 10/12/20-11/01/20    Authorization - Visit Number 2    Authorization - Number of Visits 3    Progress Note Due on Visit 10    PT Start Time 1135    PT Stop Time 1215    PT Time Calculation (min) 40 min    Activity Tolerance Patient tolerated treatment well    Behavior During Therapy [facility] for tasks assessed/performed           Past Medical History:  Diagnosis Date  . Bipolar II disorder (HCC)   . Community acquired pneumonia of right upper lobe of lung 08/14/2017  . Depression   . GERD (gastroesophageal reflux disease)   . Near syncope   . Obesity, morbid (HCC)   . Osteoarthritis     Past Surgical History:  Procedure Laterality Date  . BICEPT TENODESIS Left 06/30/2020   Procedure: BICEPS TENODESIS;  Surgeon: Teryl Lucy, MD;  Location: [facility];  Service: Orthopedics;  Laterality: Left;  . BONE SPURS     REMOVED FROM SHOULDERS  . EYE SURGERY     STY REMOVED LEFT EYE  1992  . ROTATOR CUFF REPAIR     BILATERAL  . SHOULDER ARTHROSCOPY WITH DISTAL CLAVICLE RESECTION Left 06/30/2020   Procedure: SHOULDER ARTHROSCOPY WITH DISTAL CLAVICLE RESECTION;  Surgeon: Teryl Lucy, MD;  Location: [facility];  Service: Orthopedics;  Laterality: Left;  . SHOULDER ARTHROSCOPY WITH ROTATOR CUFF REPAIR AND SUBACROMIAL DECOMPRESSION Left 06/30/2020   Procedure: LEFT SHOULDER ARTHROSCOPY DEBRIDEMENT, ACROMIOPLASTY, DISTAL CLAVICLE  EXCISION,ROTATOR CUFF REPAIR, BICEPS TENODESIS;  Surgeon: Teryl Lucy, MD;  Location: [facility];  Service: Orthopedics;  Laterality: Left;  . TOTAL HIP ARTHROPLASTY  08/15/2011   Procedure: TOTAL HIP ARTHROPLASTY;  Surgeon: Eulas Post;  Location: [facility];  Service: Orthopedics;  Laterality: Right;  . TOTAL HIP ARTHROPLASTY Left 02/07/2017  . TOTAL HIP ARTHROPLASTY Left 02/07/2017   Procedure: LEFT TOTAL HIP ARTHROPLASTY;  Surgeon: Teryl Lucy, MD;  Location: [facility];  Service: Orthopedics;  Laterality: Left;    There were no vitals filed for this visit.   Subjective Assessment - 10/30/20 1149    Subjective Pt reports she is slowly getting better. States her L shoulder pain was 8/10 early this AM, but it is a 5/10 after taking pain medication.    Patient Stated Goals To reach up above head  to take care of items in home    Currently in Pain? Yes    Pain Score 5     Pain Location Shoulder    Pain Orientation Left    Pain Descriptors / Indicators Aching    Pain Type Chronic pain    Pain Onset More than a month ago    Pain Frequency Constant              [facility] PT Assessment - 10/30/20 0001      AROM   Left Shoulder Flexion 105 Degrees  PROM 120                        [facility] Adult PT Treatment/Exercise - 10/30/20 0001      Exercises   Exercises Shoulder      Shoulder Exercises: Standing   External Rotation Left;15 reps    Theraband Level (Shoulder External Rotation) Level 1 (Yellow)    Internal Rotation Left;15 reps    Theraband Level (Shoulder Internal Rotation) Level 1 (Yellow)    Flexion Both;12 reps    Shoulder Flexion Weight (lbs) 0    Flexion Limitations to 90d    ABduction Both;12 reps    Shoulder ABduction Weight (lbs) 0    ABduction Limitations scaption to 90d    Extension Both;15 reps    Theraband Level (Shoulder Extension) Level 2 (Red)    Row Both;15 reps    Theraband Level (Shoulder Row) Level 2 (Red)                   PT Education - 10/30/20 1520    Education Details HEP: If completed the theraband exs you don't need to complete the isometric exs    Person(s) Educated Patient    Methods Explanation;Demonstration;Tactile cues;Verbal cues;Handout    Comprehension Verbalized understanding;Returned demonstration;Verbal cues required;Tactile cues required;Need further instruction            PT Short Term Goals - 10/08/20 1530      PT SHORT TERM GOAL #1   Title Pt will be Ind in an initial HEP.    Status Achieved    Target Date 09/22/20      PT SHORT TERM GOAL #2   Title Pt will voice understanding of measures to reduce and manage her L shoulder pain. Achieved-Pt is managing per cold packs and rest    Status Achieved    Target Date 09/22/20             PT Long Term Goals - 10/08/20 1531      PT LONG TERM GOAL #1   Title Improve L shoulder strength to 4+ to 5/5 for functional use with ADLs activites to shoulder height levels    Status Deferred    Target Date 11/19/20      PT LONG TERM GOAL #2   Title Pt will demonstrate L shoulder AROMs to within 90% of the R shoulder for improved functiona use of the L UE    Status On-going    Target Date 11/19/20      PT LONG TERM GOAL #3   Title Pt will report a reduction of L shoulder pain to 4/10 or less with daily activities    Baseline 8/10    Status On-going    Target Date 11/19/20                 Plan - 10/30/20 1145    Clinical Impression Statement PT was completed to address L shoulder ROM and GH and periscapular strengthening. Pt completed today's session without compliant of increased L shoulder pain. Therband exs were added to the pt's HEP. Pt demonstrated improved use of the L shoulder with flexion PROM = 120d and AROM = 105d. Pt will benefit from continued PT to increase L shoulder ROM and strength to optimize functional use.    Personal Factors and Comorbidities Comorbidity 3+;Time since onset of  injury/illness/exacerbation;Fitness    Comorbidities OA, obesity, depression, bipolar ll    Examination-Activity Limitations Bathing;Carry;Dressing;Lift;Reach Overhead  Examination-Participation Restrictions Cleaning;Driving    Stability/Clinical Decision Making Stable/Uncomplicated    Clinical Decision Making Low    Rehab Potential Good    PT Frequency 2x / week    PT Duration 6 weeks    PT Treatment/Interventions ADLs/Self Care Home Management;Electrical Stimulation;Iontophoresis 4mg /ml Dexamethasone;Moist Heat;Traction;Ultrasound;Therapeutic exercise;Therapeutic activities;Patient/family education;Manual techniques;Dry needling;Passive range of motion;Taping;Vasopneumatic Device;Joint Manipulations    PT Next Visit Plan Progress ROM as tolerated. Progress periscapular and GH strength as tolerated. Continue manual therapy.    PT Home Exercise Plan [id number]    Consulted and Agree with Plan of Care Patient           Patient will benefit from skilled therapeutic intervention in order to improve the following deficits and impairments:  Decreased strength,Increased edema,Pain,Obesity,Impaired UE functional use,Decreased range of motion  Visit Diagnosis: Acute pain of left shoulder  S/P left rotator cuff repair  Decreased ROM of left shoulder  Muscle weakness (generalized)     Problem List Patient Active Problem List   Diagnosis Date Noted  . Encounter for screening for cervical cancer 07/15/2020  . Malodorous urine 07/15/2020  . Chronic GERD 03/19/2020  . Syncope 03/14/2020  . Dyspnea on exertion 03/11/2020  . Dizziness 03/11/2020  . Adhesive capsulitis of right shoulder 01/02/2020  . High risk social situation 03/26/2019  . Cramp in muscle 12/11/2018  . TSH deficiency 10/12/2018  . Encounter for screening mammogram for malignant neoplasm of breast 10/09/2018  . Unintentional weight loss 10/09/2018  . Prediabetes 10/08/2018  . Possible exposure to STD 06/13/2018  .  Weight loss 06/13/2018  . Lower respiratory infection 12/21/2017  . Foot pain, bilateral 10/27/2017  . Routine screening for STI (sexually transmitted infection) 10/27/2017  . Gait instability 10/27/2017  . Lesion of stomach 09/07/2017  . Nonspecific abnormal findings on imaging of lung 08/21/2017  . Mood disorder (HCC)   . Chronic pain syndrome   . Anxiety state 03/07/2017  . Primary localized osteoarthritis of left hip 02/07/2017  . Fibromyalgia 05/15/2014  . Colonoscopy refused 05/15/2014  . Healthcare maintenance 05/15/2014  . Schizoaffective disorder (HCC) 12/13/2012  . Other and unspecified hyperlipidemia 12/13/2012  . Plantar fasciitis 04/16/2012  . Primary osteoarthritis of right hip 08/15/2011  . Obesity, morbid (HCC)   . Bipolar 2 disorder (HCC) 06/15/2011  . TOBACCO DEPENDENCE 11/30/2006  . OSTEOARTHRITIS OF SPINE, NOS 11/30/2006  . INCONTINENCE, URGE 11/30/2006      12/02/2006 MS, PT 10/30/20 3:30 PM  [facility] [address] [place], [place], [place] Phone: [phone]   Fax:  [phone]  Name: ALINE WESCHE MRN: Margaretann Loveless Date of Birth: 05/11/1961

## 2020-11-02 ENCOUNTER — Other Ambulatory Visit: Payer: Self-pay

## 2020-11-02 ENCOUNTER — Ambulatory Visit: Payer: Medicaid Other

## 2020-11-02 DIAGNOSIS — M25512 Pain in left shoulder: Secondary | ICD-10-CM | POA: Diagnosis not present

## 2020-11-02 DIAGNOSIS — M25612 Stiffness of left shoulder, not elsewhere classified: Secondary | ICD-10-CM

## 2020-11-02 DIAGNOSIS — M6281 Muscle weakness (generalized): Secondary | ICD-10-CM

## 2020-11-02 DIAGNOSIS — Z9889 Other specified postprocedural states: Secondary | ICD-10-CM

## 2020-11-02 NOTE — Therapy (Signed)
[facility] Outpatient Rehabilitation [facility] [address] [place], [place], [address] Phone: [phone]   Fax:  [phone]  Physical Therapy Treatment Re-evaluation   Patient Details  Name: Bridget [name] MRN: [id number] Date of Birth: December 12, 1960 Referring Provider (PT): Teryl Lucy, MD   Encounter Date: 11/02/2020   PT End of Session - 11/02/20 1439    Visit Number 6    Number of Visits 18    Date for PT Re-Evaluation 12/14/20    Authorization Type MEDICAID [place] ACCESS    Authorization Time Period requesting additional auth 11/02/20    Authorization - Visit Number --    Authorization - Number of Visits --    PT Start Time 1413    PT Stop Time 1455    PT Time Calculation (min) 42 min    Activity Tolerance Patient tolerated treatment well    Behavior During Therapy [facility] for tasks assessed/performed           Past Medical History:  Diagnosis Date  . Bipolar II disorder (HCC)   . Community acquired pneumonia of right upper lobe of lung 08/14/2017  . Depression   . GERD (gastroesophageal reflux disease)   . Near syncope   . Obesity, morbid (HCC)   . Osteoarthritis     Past Surgical History:  Procedure Laterality Date  . BICEPT TENODESIS Left 06/30/2020   Procedure: BICEPS TENODESIS;  Surgeon: Teryl Lucy, MD;  Location: [facility];  Service: Orthopedics;  Laterality: Left;  . BONE SPURS     REMOVED FROM SHOULDERS  . EYE SURGERY     STY REMOVED LEFT EYE  1992  . ROTATOR CUFF REPAIR     BILATERAL  . SHOULDER ARTHROSCOPY WITH DISTAL CLAVICLE RESECTION Left 06/30/2020   Procedure: SHOULDER ARTHROSCOPY WITH DISTAL CLAVICLE RESECTION;  Surgeon: Teryl Lucy, MD;  Location: [facility];  Service: Orthopedics;  Laterality: Left;  . SHOULDER ARTHROSCOPY WITH ROTATOR CUFF REPAIR AND SUBACROMIAL DECOMPRESSION Left 06/30/2020   Procedure: LEFT SHOULDER ARTHROSCOPY DEBRIDEMENT, ACROMIOPLASTY, DISTAL CLAVICLE  EXCISION,ROTATOR CUFF REPAIR, BICEPS TENODESIS;  Surgeon: Teryl Lucy, MD;  [facility];  Service: Orthopedics;  Laterality: Left;  . TOTAL HIP ARTHROPLASTY  08/15/2011   Procedure: TOTAL HIP ARTHROPLASTY;  Surgeon: Eulas Post;  Location: [facility];  Service: Orthopedics;  Laterality: Right;  . TOTAL HIP ARTHROPLASTY Left 02/07/2017  . TOTAL HIP ARTHROPLASTY Left 02/07/2017   Procedure: LEFT TOTAL HIP ARTHROPLASTY;  Surgeon: Teryl Lucy, MD;  Location: [facility];  Service: Orthopedics;  Laterality: Left;    There were no vitals filed for this visit.   Subjective Assessment - 11/02/20 1414    Subjective Patient feels the shoulder is moving a little better. She can reach behind her back a little bit and when she lays down she can reach her arm all the way overhead. "It's not as painful to wash under my arm anymore." She has f/u with physician on 11/11/20.    Patient Stated Goals To reach up above head  to take care of items in home    Currently in Pain? Yes    Pain Score 6     Pain Location Shoulder    Pain Orientation Left    Pain Descriptors / Indicators Aching    Pain Type Chronic pain    Pain Onset More than a month ago              [facility] PT Assessment - 11/02/20 0001  AROM   AROM Assessment Site --   pain with all shoulder AROM   Right/Left Shoulder Left    Left Shoulder Flexion 138 Degrees    Left Shoulder ABduction 84 Degrees    Left Shoulder External Rotation 25 Degrees      PROM   Overall PROM Comments pain with all PROM    Left Shoulder Flexion 150 Degrees    Left Shoulder ABduction 105 Degrees    Left Shoulder Internal Rotation 70 Degrees    Left Shoulder External Rotation 32 Degrees      Strength   Overall Strength Comments not assessed due to AROM limitations                         [facility] Adult PT Treatment/Exercise - 11/02/20 0001      Shoulder Exercises: Sidelying   External Rotation Limitations 2 x 15     ABduction Limitations 2 x 10; 1#      Shoulder Exercises: ROM/Strengthening   UBE (Upper Arm Bike) 2 min each fwd/bwd level 1      Manual Therapy   Manual therapy comments PROM to lt shoulder in all planes                    PT Short Term Goals - 10/08/20 1530      PT SHORT TERM GOAL #1   Title Pt will be Ind in an initial HEP.    Status Achieved    Target Date 09/22/20      PT SHORT TERM GOAL #2   Title Pt will voice understanding of measures to reduce and manage her L shoulder pain. Achieved-Pt is managing per cold packs and rest    Status Achieved    Target Date 09/22/20             PT Long Term Goals - 11/02/20 1435      PT LONG TERM GOAL #1   Title Improve L shoulder strength to 4+ to 5/5 for functional use with ADLs activites to shoulder height levels    Baseline deferred due to ROM limitations    Time 6    Period Weeks    Status Deferred    Target Date 12/14/20      PT LONG TERM GOAL #2   Title Pt will demonstrate L shoulder AROMs to within 90% of the R shoulder for improved functiona use of the L UE    Baseline ER 25, Flexion138, Abduction 84    Time 6    Period Weeks    Status On-going    Target Date 12/14/20      PT LONG TERM GOAL #3   Title Pt will report a reduction of L shoulder pain to 4/10 or less with daily activities    Baseline 8/10 at worst    Time 6    Period Weeks    Status On-going    Target Date 12/14/20                 Plan - 11/02/20 1402    Clinical Impression Statement Patient has attended 6 PT sessions since start of care on 09/04/20 s/p Lt RCR on 06/30/20 making gradual gains in Lt shoulder ROM. Her progress has been slower than expected, though likely due to inconsistent PT care over the past 2 months. She has moderate limitations remaining in all planes of passive and active ROM secondary to pain/weakness with formal strength assessment  deferred secondary to ROM limitations. She will benefit from consistent PT to  address lingering ROM and strength deficits in order to return to optimal function.    Personal Factors and Comorbidities Comorbidity 3+;Time since onset of injury/illness/exacerbation;Fitness    Comorbidities OA, obesity, depression, bipolar ll    Examination-Activity Limitations Bathing;Carry;Dressing;Lift;Reach Overhead    Examination-Participation Restrictions Cleaning;Driving    Stability/Clinical Decision Making Stable/Uncomplicated    Rehab Potential Good    PT Frequency 2x / week    PT Duration 6 weeks    PT Treatment/Interventions ADLs/Self Care Home Management;Electrical Stimulation;Iontophoresis 4mg /ml Dexamethasone;Moist Heat;Traction;Ultrasound;Therapeutic exercise;Therapeutic activities;Patient/family education;Manual techniques;Dry needling;Passive range of motion;Taping;Vasopneumatic Device;Joint Manipulations    PT Next Visit Plan Progress ROM as tolerated. Progress periscapular and GH strength as tolerated. Continue manual therapy.    PT Home Exercise Plan [id number]    Consulted and Agree with Plan of Care Patient           Patient will benefit from skilled therapeutic intervention in order to improve the following deficits and impairments:  Decreased strength,Increased edema,Pain,Obesity,Impaired UE functional use,Decreased range of motion  Visit Diagnosis: Acute pain of left shoulder  S/P left rotator cuff repair  Decreased ROM of left shoulder  Muscle weakness (generalized)     Problem List Patient Active Problem List   Diagnosis Date Noted  . Encounter for screening for cervical cancer 07/15/2020  . Malodorous urine 07/15/2020  . Chronic GERD 03/19/2020  . Syncope 03/14/2020  . Dyspnea on exertion 03/11/2020  . Dizziness 03/11/2020  . Adhesive capsulitis of right shoulder 01/02/2020  . High risk social situation 03/26/2019  . Cramp in muscle 12/11/2018  . TSH deficiency 10/12/2018  . Encounter for screening mammogram for malignant neoplasm of breast  10/09/2018  . Unintentional weight loss 10/09/2018  . Prediabetes 10/08/2018  . Possible exposure to STD 06/13/2018  . Weight loss 06/13/2018  . Lower respiratory infection 12/21/2017  . Foot pain, bilateral 10/27/2017  . Routine screening for STI (sexually transmitted infection) 10/27/2017  . Gait instability 10/27/2017  . Lesion of stomach 09/07/2017  . Nonspecific abnormal findings on imaging of lung 08/21/2017  . Mood disorder (HCC)   . Chronic pain syndrome   . Anxiety state 03/07/2017  . Primary localized osteoarthritis of left hip 02/07/2017  . Fibromyalgia 05/15/2014  . Colonoscopy refused 05/15/2014  . Healthcare maintenance 05/15/2014  . Schizoaffective disorder (HCC) 12/13/2012  . Other and unspecified hyperlipidemia 12/13/2012  . Plantar fasciitis 04/16/2012  . Primary osteoarthritis of right hip 08/15/2011  . Obesity, morbid (HCC)   . Bipolar 2 disorder (HCC) 06/15/2011  . TOBACCO DEPENDENCE 11/30/2006  . OSTEOARTHRITIS OF SPINE, NOS 11/30/2006  . INCONTINENCE, URGE 11/30/2006   12/02/2006, PT, DPT, ATC 11/02/20 2:58 PM  [facility] [address] [place], [place], [place] Phone: [phone]   Fax:  [phone]  Name: Bridget [name] MRN: Margaretann Loveless Date of Birth: 11/16/1960

## 2020-11-02 NOTE — Addendum Note (Signed)
Addended by: [name] Libra C on: 11/02/2020 03:00 PM   Modules accepted: Orders

## 2020-11-03 ENCOUNTER — Ambulatory Visit (HOSPITAL_COMMUNITY): Payer: Medicaid Other | Admitting: Psychiatry

## 2020-11-04 ENCOUNTER — Other Ambulatory Visit: Payer: Self-pay

## 2020-11-04 ENCOUNTER — Ambulatory Visit: Payer: Medicaid Other | Attending: Orthopedic Surgery

## 2020-11-04 DIAGNOSIS — M25512 Pain in left shoulder: Secondary | ICD-10-CM | POA: Insufficient documentation

## 2020-11-04 DIAGNOSIS — Z9889 Other specified postprocedural states: Secondary | ICD-10-CM | POA: Diagnosis present

## 2020-11-04 DIAGNOSIS — M6281 Muscle weakness (generalized): Secondary | ICD-10-CM | POA: Diagnosis present

## 2020-11-04 DIAGNOSIS — M25612 Stiffness of left shoulder, not elsewhere classified: Secondary | ICD-10-CM | POA: Diagnosis present

## 2020-11-05 NOTE — Therapy (Signed)
[facility] Outpatient Rehabilitation [facility] [address] [place], [place], [address] Phone: [phone]   Fax:  [phone]  Physical Therapy Treatment  Patient Details  Name: Bridget [name] MRN: [id number] Date of Birth: 09-Nov-1960 Referring Provider (PT): Teryl Lucy, MD   Encounter Date: 11/04/2020   PT End of Session - 11/04/20 1621    Visit Number 7    Number of Visits 18    Date for PT Re-Evaluation 12/14/20    Authorization Type MEDICAID [place] ACCESS    Authorization Time Period requesting additional auth 11/02/20    Progress Note Due on Visit 10    PT Start Time 1620    PT Stop Time 1655   Pt needed to leave appt early   PT Time Calculation (min) 35 min    Activity Tolerance Patient tolerated treatment well    Behavior During Therapy [facility] for tasks assessed/performed           Past Medical History:  Diagnosis Date  . Bipolar II disorder (HCC)   . Community acquired pneumonia of right upper lobe of lung 08/14/2017  . Depression   . GERD (gastroesophageal reflux disease)   . Near syncope   . Obesity, morbid (HCC)   . Osteoarthritis     Past Surgical History:  Procedure Laterality Date  . BICEPT TENODESIS Left 06/30/2020   Procedure: BICEPS TENODESIS;  Surgeon: Teryl Lucy, MD;  Location: [facility];  Service: Orthopedics;  Laterality: Left;  . BONE SPURS     REMOVED FROM SHOULDERS  . EYE SURGERY     STY REMOVED LEFT EYE  1992  . ROTATOR CUFF REPAIR     BILATERAL  . SHOULDER ARTHROSCOPY WITH DISTAL CLAVICLE RESECTION Left 06/30/2020   Procedure: SHOULDER ARTHROSCOPY WITH DISTAL CLAVICLE RESECTION;  Surgeon: Teryl Lucy, MD;  Location: [facility];  Service: Orthopedics;  Laterality: Left;  . SHOULDER ARTHROSCOPY WITH ROTATOR CUFF REPAIR AND SUBACROMIAL DECOMPRESSION Left 06/30/2020   Procedure: LEFT SHOULDER ARTHROSCOPY DEBRIDEMENT, ACROMIOPLASTY, DISTAL CLAVICLE EXCISION,ROTATOR CUFF REPAIR, BICEPS  TENODESIS;  Surgeon: Teryl Lucy, MD;  Location: [facility];  Service: Orthopedics;  Laterality: Left;  . TOTAL HIP ARTHROPLASTY  08/15/2011   Procedure: TOTAL HIP ARTHROPLASTY;  Surgeon: Eulas Post;  Location: [facility];  Service: Orthopedics;  Laterality: Right;  . TOTAL HIP ARTHROPLASTY Left 02/07/2017  . TOTAL HIP ARTHROPLASTY Left 02/07/2017   Procedure: LEFT TOTAL HIP ARTHROPLASTY;  Surgeon: Teryl Lucy, MD;  Location: [facility];  Service: Orthopedics;  Laterality: Left;    There were no vitals filed for this visit.   Subjective Assessment - 11/04/20 1626    Subjective L shoulder is getting better. Able to use her L arm better with dressing and hygiene.    Patient Stated Goals To reach up above head  to take care of items in home    Currently in Pain? Yes    Pain Score 6     Pain Location Shoulder    Pain Orientation Left    Pain Descriptors / Indicators Aching    Pain Type Chronic pain    Pain Onset More than a month ago    Pain Frequency Constant                             [facility] Adult PT Treatment/Exercise - 11/05/20 0001      Exercises   Exercises Shoulder      Shoulder  Exercises: Supine   Horizontal ABduction Both;15 reps   2 sets   Theraband Level (Shoulder Horizontal ABduction) Level 2 (Red)      Shoulder Exercises: Sidelying   External Rotation Left;10 reps   3 sets   External Rotation Weight (lbs) 1    ABduction Limitations 3 x 10; 1#      Shoulder Exercises: ROM/Strengthening   UBE (Upper Arm Bike) 2.5 min each fwd/bwd level 1    Other ROM/Strengthening Exercises Shoulder ladder 5x, 15 sce      Shoulder Exercises: Stretch   Other Shoulder Stretches Upper trap and levator stretches 2x, 20 sec                  PT Education - 11/04/20 1645    Education Details HEP: Upper trap and levator scapulae stretches. Use of theracane for upper trap and levator scapulae massage.    Person(s) Educated Patient    Methods  Explanation;Demonstration;Tactile cues;Verbal cues;Handout    Comprehension Verbalized understanding;Returned demonstration;Verbal cues required;Tactile cues required;Need further instruction            PT Short Term Goals - 10/08/20 1530      PT SHORT TERM GOAL #1   Title Pt will be Ind in an initial HEP.    Status Achieved    Target Date 09/22/20      PT SHORT TERM GOAL #2   Title Pt will voice understanding of measures to reduce and manage her L shoulder pain. Achieved-Pt is managing per cold packs and rest    Status Achieved    Target Date 09/22/20             PT Long Term Goals - 11/02/20 1435      PT LONG TERM GOAL #1   Title Improve L shoulder strength to 4+ to 5/5 for functional use with ADLs activites to shoulder height levels    Baseline deferred due to ROM limitations    Time 6    Period Weeks    Status Deferred    Target Date 12/14/20      PT LONG TERM GOAL #2   Title Pt will demonstrate L shoulder AROMs to within 90% of the R shoulder for improved functiona use of the L UE    Baseline ER 25, Flexion138, Abduction 84    Time 6    Period Weeks    Status On-going    Target Date 12/14/20      PT LONG TERM GOAL #3   Title Pt will report a reduction of L shoulder pain to 4/10 or less with daily activities    Baseline 8/10 at worst    Time 6    Period Weeks    Status On-going    Target Date 12/14/20                 Plan - 11/05/20 0741    Clinical Impression Statement Pt reported tightness of her upper trap area and stretches were provided and reviewed use of a theracane for this area. PT was completed for L shoulder ROM and for rotator cuff/peri-scapular strengthening. Pt's subjective report indicates improved functional use of the L UE. Pt tolerated the session without adverse effects.    Personal Factors and Comorbidities Comorbidity 3+;Time since onset of injury/illness/exacerbation;Fitness    Comorbidities OA, obesity, depression, bipolar ll     Examination-Activity Limitations Bathing;Carry;Dressing;Lift;Reach Overhead    Examination-Participation Restrictions Cleaning;Driving    Stability/Clinical Decision Making Stable/Uncomplicated    Clinical  Decision Making Low    Rehab Potential Good    PT Frequency 2x / week    PT Duration 6 weeks    PT Treatment/Interventions ADLs/Self Care Home Management;Electrical Stimulation;Iontophoresis 4mg /ml Dexamethasone;Moist Heat;Traction;Ultrasound;Therapeutic exercise;Therapeutic activities;Patient/family education;Manual techniques;Dry needling;Passive range of motion;Taping;Vasopneumatic Device;Joint Manipulations    PT Next Visit Plan Progress ROM as tolerated. Progress periscapular and GH strength as tolerated. Continue manual therapy. Assess response to upper trap and levator scap stretches    PT Home Exercise Plan [id number]    Consulted and Agree with Plan of Care Patient           Patient will benefit from skilled therapeutic intervention in order to improve the following deficits and impairments:  Decreased strength,Increased edema,Pain,Obesity,Impaired UE functional use,Decreased range of motion  Visit Diagnosis: Acute pain of left shoulder  S/P left rotator cuff repair  Decreased ROM of left shoulder  Muscle weakness (generalized)     Problem List Patient Active Problem List   Diagnosis Date Noted  . Encounter for screening for cervical cancer 07/15/2020  . Malodorous urine 07/15/2020  . Chronic GERD 03/19/2020  . Syncope 03/14/2020  . Dyspnea on exertion 03/11/2020  . Dizziness 03/11/2020  . Adhesive capsulitis of right shoulder 01/02/2020  . High risk social situation 03/26/2019  . Cramp in muscle 12/11/2018  . TSH deficiency 10/12/2018  . Encounter for screening mammogram for malignant neoplasm of breast 10/09/2018  . Unintentional weight loss 10/09/2018  . Prediabetes 10/08/2018  . Possible exposure to STD 06/13/2018  . Weight loss 06/13/2018  . Lower  respiratory infection 12/21/2017  . Foot pain, bilateral 10/27/2017  . Routine screening for STI (sexually transmitted infection) 10/27/2017  . Gait instability 10/27/2017  . Lesion of stomach 09/07/2017  . Nonspecific abnormal findings on imaging of lung 08/21/2017  . Mood disorder (HCC)   . Chronic pain syndrome   . Anxiety state 03/07/2017  . Primary localized osteoarthritis of left hip 02/07/2017  . Fibromyalgia 05/15/2014  . Colonoscopy refused 05/15/2014  . Healthcare maintenance 05/15/2014  . Schizoaffective disorder (HCC) 12/13/2012  . Other and unspecified hyperlipidemia 12/13/2012  . Plantar fasciitis 04/16/2012  . Primary osteoarthritis of right hip 08/15/2011  . Obesity, morbid (HCC)   . Bipolar 2 disorder (HCC) 06/15/2011  . TOBACCO DEPENDENCE 11/30/2006  . OSTEOARTHRITIS OF SPINE, NOS 11/30/2006  . INCONTINENCE, URGE 11/30/2006    12/02/2006 MS, PT 11/05/20 7:52 AM  [facility] [address] [place], [place], [place] Phone: [phone]   Fax:  [phone]  Name: Bridget [name] MRN: Margaretann Loveless Date of Birth: [birth year]

## 2020-11-09 ENCOUNTER — Ambulatory Visit: Payer: Medicaid Other

## 2020-11-10 ENCOUNTER — Ambulatory Visit (INDEPENDENT_AMBULATORY_CARE_PROVIDER_SITE_OTHER): Payer: Medicaid Other | Admitting: Psychiatry

## 2020-11-10 ENCOUNTER — Other Ambulatory Visit: Payer: Self-pay

## 2020-11-10 ENCOUNTER — Encounter (HOSPITAL_COMMUNITY): Payer: Self-pay | Admitting: Psychiatry

## 2020-11-10 DIAGNOSIS — F4310 Post-traumatic stress disorder, unspecified: Secondary | ICD-10-CM

## 2020-11-10 DIAGNOSIS — F331 Major depressive disorder, recurrent, moderate: Secondary | ICD-10-CM

## 2020-11-10 NOTE — Progress Notes (Signed)
Virtual Visit via Video Note  I connected with Bridget [name] on 48/88/91 at  2:30 PM EST by a video enabled telemedicine application and verified that I am speaking with the correct person using two identifiers.  Location: Patient:HOME Provider:OFFICE  History of Present Illness: MDD and PTSD  Treatment Plan Goals: 1) Bridget [name] would like to process grief and loss experiences to decrease depressive symptoms. 2) Bridget [name] would like to process and heal from trauma experiences to decrease instances of trauma triggers/reactions with impact her daily functioning and fulfillment in relationships.  Observations/Objective: Counselor met with Client for individual therapyvia webex. Counselor assessed MH symptoms and progress on treatment plan goals, with patient reportingthat she is currently upset about a familial issue and continuing to deal with recovery from surgery. Client presents withmoderatedepression and moderateanxiety. Client denied suicidal ideation or self-harm behaviors.  Goal 1) Counselor assessed grief and loss reminders/triggers and their impact on depression since our last session, using CBT interventions. Counselor stated that she has been following up and supporting friends who have recently lost loved ones. Counselor and Client processed how this impacted grief reminders and responses. Client stated that she is in the acceptance stage with some sadness, not significantly impacted by her grief at this time.   Goal 2)Counselor processed and assessed trauma triggers and reminders associated with her relationship with her former husband. Counselor shared about familial issues impacting her trauma responses of shutting down, withdrawing from others. Client discussed flashbacks of how the issues parallel that of her and her ex husband, causing her to be concerned for her daughter.  Counselor provided trauma related materials and reviewed concepts.                                                                                            Assessment and Plan: Counselor will continue to meet with patient to address treatment plan goals. Patient will continue to follow recommendations of providers and implement skills learned in session.  Follow Up Instructions: Counselor will send information for next session viae-mail.   The patient was advised to call back or seek an in-person evaluation if the symptoms worsen or if the condition fails to improve as anticipated.  I provided31mnutes of face-to-face time during this encounter.   BLise Auer LCSW

## 2020-11-12 ENCOUNTER — Ambulatory Visit: Payer: Medicaid Other

## 2020-11-12 ENCOUNTER — Other Ambulatory Visit: Payer: Self-pay

## 2020-11-12 ENCOUNTER — Other Ambulatory Visit: Payer: Self-pay | Admitting: Orthopedic Surgery

## 2020-11-12 DIAGNOSIS — Z9889 Other specified postprocedural states: Secondary | ICD-10-CM

## 2020-11-12 DIAGNOSIS — M25512 Pain in left shoulder: Secondary | ICD-10-CM | POA: Diagnosis not present

## 2020-11-12 DIAGNOSIS — M6281 Muscle weakness (generalized): Secondary | ICD-10-CM

## 2020-11-12 DIAGNOSIS — M25612 Stiffness of left shoulder, not elsewhere classified: Secondary | ICD-10-CM

## 2020-11-13 NOTE — Therapy (Signed)
[facility] Outpatient Rehabilitation [facility] [address] [place], [place], [address] Phone: [phone]   Fax:  [phone]  Physical Therapy Treatment  Patient Details  Name: Bridget [name] MRN: [id number] Date of Birth: 05/08/1961 Referring Provider (PT): Teryl Lucy, MD   Encounter Date: 11/12/2020   PT End of Session - 11/13/20 0738    Visit Number 8    Number of Visits 18    Date for PT Re-Evaluation 12/14/20    Authorization Type MEDICAID [place] ACCESS    Authorization Time Period requesting additional auth 11/02/20    Progress Note Due on Visit 10    PT Start Time 1700    PT Stop Time 1746    PT Time Calculation (min) 46 min    Activity Tolerance Patient tolerated treatment well    Behavior During Therapy [facility] for tasks assessed/performed           Past Medical History:  Diagnosis Date  . Bipolar II disorder (HCC)   . Community acquired pneumonia of right upper lobe of lung 08/14/2017  . Depression   . GERD (gastroesophageal reflux disease)   . Near syncope   . Obesity, morbid (HCC)   . Osteoarthritis     Past Surgical History:  Procedure Laterality Date  . BICEPT TENODESIS Left 06/30/2020   Procedure: BICEPS TENODESIS;  Surgeon: Teryl Lucy, MD;  Location: [facility];  Service: Orthopedics;  Laterality: Left;  . BONE SPURS     REMOVED FROM SHOULDERS  . EYE SURGERY     STY REMOVED LEFT EYE  1992  . ROTATOR CUFF REPAIR     BILATERAL  . SHOULDER ARTHROSCOPY WITH DISTAL CLAVICLE RESECTION Left 06/30/2020   Procedure: SHOULDER ARTHROSCOPY WITH DISTAL CLAVICLE RESECTION;  Surgeon: Teryl Lucy, MD;  Location: [facility];  Service: Orthopedics;  Laterality: Left;  . SHOULDER ARTHROSCOPY WITH ROTATOR CUFF REPAIR AND SUBACROMIAL DECOMPRESSION Left 06/30/2020   Procedure: LEFT SHOULDER ARTHROSCOPY DEBRIDEMENT, ACROMIOPLASTY, DISTAL CLAVICLE EXCISION,ROTATOR CUFF REPAIR, BICEPS TENODESIS;  Surgeon: Teryl Lucy, MD;  Location: [facility];  Service: Orthopedics;  Laterality: Left;  . TOTAL HIP ARTHROPLASTY  08/15/2011   Procedure: TOTAL HIP ARTHROPLASTY;  Surgeon: Eulas Post;  Location: [facility];  Service: Orthopedics;  Laterality: Right;  . TOTAL HIP ARTHROPLASTY Left 02/07/2017  . TOTAL HIP ARTHROPLASTY Left 02/07/2017   Procedure: LEFT TOTAL HIP ARTHROPLASTY;  Surgeon: Teryl Lucy, MD;  Location: [facility];  Service: Orthopedics;  Laterality: Left;    There were no vitals filed for this visit.   Subjective Assessment - 11/12/20 1706    Subjective Pt reports seeing Dr. Dion Saucier yesterday and he is going to have a MRI for her L shoulder related to pain with raising her arm.    Patient Stated Goals To reach up above head  to take care of items in home    Currently in Pain? Yes    Pain Score 6     Pain Location Shoulder    Pain Orientation Left    Pain Descriptors / Indicators Aching    Pain Type Chronic pain    Pain Onset More than a month ago    Pain Frequency Constant              [facility] PT Assessment - 11/13/20 0001      PROM   Right Shoulder Internal Rotation 50 Degrees   to upper glute c behind reaching   Right Shoulder External Rotation 25 Degrees  to t1 with behind reaching                        [facility] Adult PT Treatment/Exercise - 11/13/20 0001      Exercises   Exercises Shoulder      Shoulder Exercises: Supine   Protraction Both;10 reps   2 sets   Flexion Both;12 reps    Flexion Limitations wand      Shoulder Exercises: Standing   External Rotation Left;10 reps   3 sets   Internal Rotation Left;10 reps   3 sets   Theraband Level (Shoulder Internal Rotation) Level 2 (Red)    Extension Both;15 reps   2 sets   Theraband Level (Shoulder Extension) Level 3 (Green)    Row Both;15 reps   2 sets   Theraband Level (Shoulder Row) Level 3 (Green)      Manual Therapy   Manual Therapy Passive ROM;Joint mobilization    Joint Mobilization  Grade 2-3 inf glides, PA, AP mobs    Passive ROM PROM as tolerated for L shoulder IR and ER. Pt reports stretching and discomfort at endranges of movements                    PT Short Term Goals - 10/08/20 1530      PT SHORT TERM GOAL #1   Title Pt will be Ind in an initial HEP.    Status Achieved    Target Date 09/22/20      PT SHORT TERM GOAL #2   Title Pt will voice understanding of measures to reduce and manage her L shoulder pain. Achieved-Pt is managing per cold packs and rest    Status Achieved    Target Date 09/22/20             PT Long Term Goals - 11/02/20 1435      PT LONG TERM GOAL #1   Title Improve L shoulder strength to 4+ to 5/5 for functional use with ADLs activites to shoulder height levels    Baseline deferred due to ROM limitations    Time 6    Period Weeks    Status Deferred    Target Date 12/14/20      PT LONG TERM GOAL #2   Title Pt will demonstrate L shoulder AROMs to within 90% of the R shoulder for improved functiona use of the L UE    Baseline ER 25, Flexion138, Abduction 84    Time 6    Period Weeks    Status On-going    Target Date 12/14/20      PT LONG TERM GOAL #3   Title Pt will report a reduction of L shoulder pain to 4/10 or less with daily activities    Baseline 8/10 at worst    Time 6    Period Weeks    Status On-going    Target Date 12/14/20                 Plan - 11/13/20 0739    Clinical Impression Statement PT was completed for glenohumeral and peri-scapular strengthening , as well as addressing L shoulder ER and IR ROM per mobs and PROM. pt is tolerating progression in strengthening. Both ER, IR, and ext are decreased limiting functional ROM for reaching behind head and back with behind being the most limited esp. related to limited ext. Pt tolerated today's PT session s adverse effects. Overall, functional use of the L  UE is improving. Pt did report her surgeon is going to schedule a MRI to assess her L  shoulder related to her continued pain. Pt feels she has bursitiis.    Personal Factors and Comorbidities Comorbidity 3+;Time since onset of injury/illness/exacerbation;Fitness    Comorbidities OA, obesity, depression, bipolar ll    Examination-Activity Limitations Bathing;Carry;Dressing;Lift;Reach Overhead    Examination-Participation Restrictions Cleaning;Driving    Stability/Clinical Decision Making Stable/Uncomplicated    Clinical Decision Making Low    Rehab Potential Good    PT Frequency 2x / week    PT Duration 6 weeks    PT Treatment/Interventions ADLs/Self Care Home Management;Electrical Stimulation;Iontophoresis 4mg /ml Dexamethasone;Moist Heat;Traction;Ultrasound;Therapeutic exercise;Therapeutic activities;Patient/family education;Manual techniques;Dry needling;Passive range of motion;Taping;Vasopneumatic Device;Joint Manipulations    PT Next Visit Plan Progress ROM as tolerated. Progress periscapular and GH strength as tolerated. Continue manual therapy. Assess response to upper trap and levator scap stretches    PT Home Exercise Plan [id number]    Consulted and Agree with Plan of Care Patient           Patient will benefit from skilled therapeutic intervention in order to improve the following deficits and impairments:  Decreased strength,Increased edema,Pain,Obesity,Impaired UE functional use,Decreased range of motion  Visit Diagnosis: No diagnosis found.     Problem List Patient Active Problem List   Diagnosis Date Noted  . Encounter for screening for cervical cancer 07/15/2020  . Malodorous urine 07/15/2020  . Chronic GERD 03/19/2020  . Syncope 03/14/2020  . Dyspnea on exertion 03/11/2020  . Dizziness 03/11/2020  . Adhesive capsulitis of right shoulder 01/02/2020  . High risk social situation 03/26/2019  . Cramp in muscle 12/11/2018  . TSH deficiency 10/12/2018  . Encounter for screening mammogram for malignant neoplasm of breast 10/09/2018  . Unintentional  weight loss 10/09/2018  . Prediabetes 10/08/2018  . Possible exposure to STD 06/13/2018  . Weight loss 06/13/2018  . Lower respiratory infection 12/21/2017  . Foot pain, bilateral 10/27/2017  . Routine screening for STI (sexually transmitted infection) 10/27/2017  . Gait instability 10/27/2017  . Lesion of stomach 09/07/2017  . Nonspecific abnormal findings on imaging of lung 08/21/2017  . Mood disorder (HCC)   . Chronic pain syndrome   . Anxiety state 03/07/2017  . Primary localized osteoarthritis of left hip 02/07/2017  . Fibromyalgia 05/15/2014  . Colonoscopy refused 05/15/2014  . Healthcare maintenance 05/15/2014  . Schizoaffective disorder (HCC) 12/13/2012  . Other and unspecified hyperlipidemia 12/13/2012  . Plantar fasciitis 04/16/2012  . Primary osteoarthritis of right hip 08/15/2011  . Obesity, morbid (HCC)   . Bipolar 2 disorder (HCC) 06/15/2011  . TOBACCO DEPENDENCE 11/30/2006  . OSTEOARTHRITIS OF SPINE, NOS 11/30/2006  . INCONTINENCE, URGE 11/30/2006    12/02/2006 MS, PT 11/13/20 8:52 AM  [facility] [address] [place], [place], [place] Phone: [phone]   Fax:  [phone]  Name: VALINA MAES MRN: Margaretann Loveless Date of Birth: July 14, 1961

## 2020-11-16 ENCOUNTER — Other Ambulatory Visit: Payer: Self-pay

## 2020-11-16 ENCOUNTER — Ambulatory Visit: Payer: Medicaid Other

## 2020-11-16 DIAGNOSIS — Z9889 Other specified postprocedural states: Secondary | ICD-10-CM

## 2020-11-16 DIAGNOSIS — M25612 Stiffness of left shoulder, not elsewhere classified: Secondary | ICD-10-CM

## 2020-11-16 DIAGNOSIS — M6281 Muscle weakness (generalized): Secondary | ICD-10-CM

## 2020-11-16 DIAGNOSIS — M25512 Pain in left shoulder: Secondary | ICD-10-CM | POA: Diagnosis not present

## 2020-11-16 NOTE — Therapy (Signed)
[facility] [address] [place], [place], [address] Phone: [phone]   Fax:  [phone]  Physical Therapy Treatment  Patient Details  Name: Bridget [name] MRN: [id number] Date of Birth: [birth year] Referring Provider (PT): Teryl Lucy, MD   Encounter Date: 11/16/2020   PT End of Session - 11/16/20 1522    Visit Number 9    Number of Visits 18    Date for PT Re-Evaluation 12/14/20    Authorization Type MEDICAID [place] ACCESS    Authorization Time Period 11/04/20-12/15/20    Authorization - Visit Number 3    Authorization - Number of Visits 12    PT Start Time 1500    PT Stop Time 1544    PT Time Calculation (min) 44 min    Activity Tolerance Patient tolerated treatment well    Behavior During Therapy [facility] for tasks assessed/performed           Past Medical History:  Diagnosis Date  . Bipolar II disorder (HCC)   . Community acquired pneumonia of right upper lobe of lung 08/14/2017  . Depression   . GERD (gastroesophageal reflux disease)   . Near syncope   . Obesity, morbid (HCC)   . Osteoarthritis     Past Surgical History:  Procedure Laterality Date  . BICEPT TENODESIS Left 06/30/2020   Procedure: BICEPS TENODESIS;  Surgeon: Teryl Lucy, MD;  Location: [facility];  Service: Orthopedics;  Laterality: Left;  . BONE SPURS     REMOVED FROM SHOULDERS  . EYE SURGERY     STY REMOVED LEFT EYE  1992  . ROTATOR CUFF REPAIR     BILATERAL  . SHOULDER ARTHROSCOPY WITH DISTAL CLAVICLE RESECTION Left 06/30/2020   Procedure: SHOULDER ARTHROSCOPY WITH DISTAL CLAVICLE RESECTION;  Surgeon: Teryl Lucy, MD;  Location: [facility];  Service: Orthopedics;  Laterality: Left;  . SHOULDER ARTHROSCOPY WITH ROTATOR CUFF REPAIR AND SUBACROMIAL DECOMPRESSION Left 06/30/2020   Procedure: LEFT SHOULDER ARTHROSCOPY DEBRIDEMENT, ACROMIOPLASTY, DISTAL CLAVICLE EXCISION,ROTATOR CUFF REPAIR, BICEPS TENODESIS;   Surgeon: Teryl Lucy, MD;  Location: [facility];  Service: Orthopedics;  Laterality: Left;  . TOTAL HIP ARTHROPLASTY  08/15/2011   Procedure: TOTAL HIP ARTHROPLASTY;  Surgeon: Eulas Post;  Location: [facility];  Service: Orthopedics;  Laterality: Right;  . TOTAL HIP ARTHROPLASTY Left 02/07/2017  . TOTAL HIP ARTHROPLASTY Left 02/07/2017   Procedure: LEFT TOTAL HIP ARTHROPLASTY;  Surgeon: Teryl Lucy, MD;  Location: [facility];  Service: Orthopedics;  Laterality: Left;    There were no vitals filed for this visit.   Subjective Assessment - 11/16/20 1503    Subjective Patient reports she is feeling ok, though shoulder is is still painful. She is still working on scheduling her MRI.    Patient Stated Goals To reach up above head  to take care of items in home    Currently in Pain? Yes    Pain Score 6     Pain Location Shoulder    Pain Orientation Left    Pain Descriptors / Indicators Aching;Pins and needles    Pain Type Chronic pain    Pain Onset More than a month ago    Pain Frequency Constant                             [facility] Adult PT Treatment/Exercise - 11/16/20 0001      Self-Care   Self-Care Other Self-Care Comments  Other Self-Care Comments  see patient education      Shoulder Exercises: Sidelying   External Rotation Limitations 2 x 15      Shoulder Exercises: Standing   Extension Limitations 2 x 15 green band    Row Limitations 2 x 15; green band      Shoulder Exercises: Pulleys   Flexion 2 minutes    ABduction 2 minutes      Shoulder Exercises: ROM/Strengthening   UBE (Upper Arm Bike) level 2; 2 min each fwd/bwd    Other ROM/Strengthening Exercises finger ladder flexion and abduction 1 x 5 each                  PT Education - 11/16/20 1536    Education Details Patient encouraged in overall progress she has made and educated that there are different factors to consider when comparing this surgery to a similar surgery on the  other shoulder years ago. green theraband issued for rows.    Person(s) Educated Patient    Methods Explanation    Comprehension Verbalized understanding            PT Short Term Goals - 10/08/20 1530      PT SHORT TERM GOAL #1   Title Pt will be Ind in an initial HEP.    Status Achieved    Target Date 09/22/20      PT SHORT TERM GOAL #2   Title Pt will voice understanding of measures to reduce and manage her L shoulder pain. Achieved-Pt is managing per cold packs and rest    Status Achieved    Target Date 09/22/20             PT Long Term Goals - 11/02/20 1435      PT LONG TERM GOAL #1   Title Improve L shoulder strength to 4+ to 5/5 for functional use with ADLs activites to shoulder height levels    Baseline deferred due to ROM limitations    Time 6    Period Weeks    Status Deferred    Target Date 12/14/20      PT LONG TERM GOAL #2   Title Pt will demonstrate L shoulder AROMs to within 90% of the R shoulder for improved functiona use of the L UE    Baseline ER 25, Flexion138, Abduction 84    Time 6    Period Weeks    Status On-going    Target Date 12/14/20      PT LONG TERM GOAL #3   Title Pt will report a reduction of L shoulder pain to 4/10 or less with daily activities    Baseline 8/10 at worst    Time 6    Period Weeks    Status On-going    Target Date 12/14/20                 Plan - 11/16/20 1527    Clinical Impression Statement Patient tolerated session well today with progression of AAROM and AROM activity without increased shoulder pain. Patient became upset due to being discouraged with her oingoing pain and progress thus far during session. PT encouraged patient to focus on functional progress she has currently achieved and to not compare this surgical outcome to previous surgeries. Patient verbalized understanding and was not upset at end of session. Able to progress rotator cuff  and periscapular strengthening with patient requiring minimal  postural cues.    Personal Factors and Comorbidities Comorbidity 3+;Time since  onset of injury/illness/exacerbation;Fitness    Comorbidities OA, obesity, depression, bipolar ll    Examination-Activity Limitations Bathing;Carry;Dressing;Lift;Reach Overhead    Examination-Participation Restrictions Cleaning;Driving    Stability/Clinical Decision Making Stable/Uncomplicated    Rehab Potential Good    PT Frequency 2x / week    PT Duration 6 weeks    PT Treatment/Interventions ADLs/Self Care Home Management;Electrical Stimulation;Iontophoresis 4mg /ml Dexamethasone;Moist Heat;Traction;Ultrasound;Therapeutic exercise;Therapeutic activities;Patient/family education;Manual techniques;Dry needling;Passive range of motion;Taping;Vasopneumatic Device;Joint Manipulations    PT Next Visit Plan update HEP. Progress ROM as tolerated. Progress periscapular and GH strength as tolerated. Continue manual therapy.    PT Home Exercise Plan [id number]    Consulted and Agree with Plan of Care Patient           Patient will benefit from skilled therapeutic intervention in order to improve the following deficits and impairments:  Decreased strength,Increased edema,Pain,Obesity,Impaired UE functional use,Decreased range of motion  Visit Diagnosis: Acute pain of left shoulder  S/P left rotator cuff repair  Decreased ROM of left shoulder  Muscle weakness (generalized)     Problem List Patient Active Problem List   Diagnosis Date Noted  . Encounter for screening for cervical cancer 07/15/2020  . Malodorous urine 07/15/2020  . Chronic GERD 03/19/2020  . Syncope 03/14/2020  . Dyspnea on exertion 03/11/2020  . Dizziness 03/11/2020  . Adhesive capsulitis of right shoulder 01/02/2020  . High risk social situation 03/26/2019  . Cramp in muscle 12/11/2018  . TSH deficiency 10/12/2018  . Encounter for screening mammogram for malignant neoplasm of breast 10/09/2018  . Unintentional weight loss 10/09/2018  .  Prediabetes 10/08/2018  . Possible exposure to STD 06/13/2018  . Weight loss 06/13/2018  . Lower respiratory infection 12/21/2017  . Foot pain, bilateral 10/27/2017  . Routine screening for STI (sexually transmitted infection) 10/27/2017  . Gait instability 10/27/2017  . Lesion of stomach 09/07/2017  . Nonspecific abnormal findings on imaging of lung 08/21/2017  . Mood disorder (HCC)   . Chronic pain syndrome   . Anxiety state 03/07/2017  . Primary localized osteoarthritis of left hip 02/07/2017  . Fibromyalgia 05/15/2014  . Colonoscopy refused 05/15/2014  . Healthcare maintenance 05/15/2014  . Schizoaffective disorder (HCC) 12/13/2012  . Other and unspecified hyperlipidemia 12/13/2012  . Plantar fasciitis 04/16/2012  . Primary osteoarthritis of right hip 08/15/2011  . Obesity, morbid (HCC)   . Bipolar 2 disorder (HCC) 06/15/2011  . TOBACCO DEPENDENCE 11/30/2006  . OSTEOARTHRITIS OF SPINE, NOS 11/30/2006  . INCONTINENCE, URGE 11/30/2006   12/02/2006, PT, DPT, ATC 11/16/20 3:57 PM [facility] [address] [place], [place], [place] Phone: [phone]   Fax:  [phone]  Name: SHANTA HARTNER MRN: Margaretann Loveless Date of Birth: 05-14-1961

## 2020-11-17 ENCOUNTER — Ambulatory Visit (INDEPENDENT_AMBULATORY_CARE_PROVIDER_SITE_OTHER): Payer: Medicaid Other | Admitting: Psychiatry

## 2020-11-17 DIAGNOSIS — F331 Major depressive disorder, recurrent, moderate: Secondary | ICD-10-CM

## 2020-11-17 DIAGNOSIS — F4310 Post-traumatic stress disorder, unspecified: Secondary | ICD-10-CM | POA: Diagnosis not present

## 2020-11-19 ENCOUNTER — Ambulatory Visit: Payer: Medicaid Other

## 2020-11-20 ENCOUNTER — Encounter (HOSPITAL_COMMUNITY): Payer: Self-pay | Admitting: Psychiatry

## 2020-11-20 NOTE — Progress Notes (Signed)
Virtual Visit via Telephone Note  I connected with [name] on 16/38/46 at  2:30 PM EST by telephone and verified that I am speaking with the correct person using two identifiers.  Location: Patient:HOME Provider:OFFICE  History of Present Illness: MDD and PTSD  Treatment Plan Goals: 1) Katessa would like to process grief and loss experiences to decrease depressive symptoms. 2) Salimatou would like to process and heal from trauma experiences to decrease instances of trauma triggers/reactions with impact her daily functioning and fulfillment in relationships.  Observations/Objective: Counselor met with Client for individual therapyvia webex. Counselor assessed MH symptoms and progress on treatment plan goals, with patient reportingthatshe is currently upset about a familial issue and continuing to deal with recovery from surgery.Client presents withmoderatedepression and moderateanxiety. Client denied suicidal ideation or self-harm behaviors.  Goal 1)Counselor assessed grief and loss reminders/triggers and their impact on depression since our last session, using CBT interventions. Counselor stated that she has been following up and supporting friends who have recently lost loved ones. Counselor and Client processed how this impacted grief reminders and responses. Client stated that she is in the acceptance stage with some sadness, not significantly impacted by her grief at this time.   Goal2)Counselor processed and assessed trauma triggers and reminders associated with her relationship with her former husband. Counselor shared about familial issues impacting her trauma responses of shutting down, withdrawing from others. Client discussed flashbacks of how the issues parallel that of her and her ex husband, causing her to be concerned for her daughter. Counselor provided trauma related materials and reviewed concepts.                                                                               Assessment and Plan: Counselor will continue to meet with patient to address treatment plan goals. Patient will continue to follow recommendations of providers and implement skills learned in session.  Follow Up Instructions: Counselor will send information for next session viae-mail.   The patient was advised to call back or seek an in-person evaluation if the symptoms worsen or if the condition fails to improve as anticipated.  I provided74mnutes of face-to-face time during this encounter.   BLise Auer LCSW

## 2020-11-24 ENCOUNTER — Encounter (HOSPITAL_COMMUNITY): Payer: Self-pay | Admitting: Psychiatry

## 2020-11-24 ENCOUNTER — Other Ambulatory Visit: Payer: Self-pay

## 2020-11-24 ENCOUNTER — Ambulatory Visit (INDEPENDENT_AMBULATORY_CARE_PROVIDER_SITE_OTHER): Payer: Medicaid Other | Admitting: Psychiatry

## 2020-11-24 DIAGNOSIS — F4310 Post-traumatic stress disorder, unspecified: Secondary | ICD-10-CM

## 2020-11-24 NOTE — Progress Notes (Signed)
Virtual Visit via Video Note  I connected with Bridget [name] on 07/57/32 at  2:30 PM EST by a video enabled telemedicine application and verified that I am speaking with the correct person using two identifiers.  Location: Patient:HOME Provider:OFFICE  History of Present Illness: MDD and PTSD  Treatment Plan Goals: 1) Bridget [name] would like to process grief and loss experiences to decrease depressive symptoms. 2) Bridget [name] would like to process and heal from trauma experiences to decrease instances of trauma triggers/reactions with impact her daily functioning and fulfillment in relationships.  Observations/Objective: Counselor met with Client for individual therapyvia webex. Counselor assessed MH symptoms and progress on treatment plan goals, with patient reportingthatshe is feeling good today, after spending quality time with family.Client presents withmoderatedepression and moderateanxiety. Client denied suicidal ideation or self-harm behaviors.  Goal 1)Counselor assessed grief and loss reminders/triggers and their impact on depression since our last session, using CBT interventions.Client processed thoughts, feelings and behaviors related to symptoms. Client requested feedback on grief and loss issues related to childhood/sibling experiences. Counselor provided pshycoeducation on family dynamics and grief and loss cycle.   Goal2)Counselor processed and assessed trauma triggers and reminders associated with her relationship with her former husband. Client noted she had not had much interaction with him over the past week, with nothing new to process today.  Assessment and Plan: Counselor will continue to meet with patient to address treatment plan goals. Patient will continue to follow recommendations of providers and implement skills learned in session.  Follow Up Instructions: Counselor will send information for next session viae-mail.   The patient was advised to  call back or seek an in-person evaluation if the symptoms worsen or if the condition fails to improve as anticipated.  I provided49mnutes of face-to-face time during this encounter.   BLise Auer LCSW

## 2020-11-25 ENCOUNTER — Other Ambulatory Visit: Payer: Self-pay

## 2020-11-25 ENCOUNTER — Ambulatory Visit: Payer: Medicaid Other

## 2020-11-25 DIAGNOSIS — M25612 Stiffness of left shoulder, not elsewhere classified: Secondary | ICD-10-CM

## 2020-11-25 DIAGNOSIS — M25512 Pain in left shoulder: Secondary | ICD-10-CM

## 2020-11-25 DIAGNOSIS — Z9889 Other specified postprocedural states: Secondary | ICD-10-CM

## 2020-11-25 DIAGNOSIS — M6281 Muscle weakness (generalized): Secondary | ICD-10-CM

## 2020-11-25 NOTE — Therapy (Signed)
[facility] [address] [place], [place], [address] Phone: [phone]   Fax:  [phone]  Physical Therapy Treatment  Patient Details  Name: Bridget [name] MRN: [id number] Date of Birth: [birth year] Referring Provider (PT): Teryl Lucy, MD   Encounter Date: 11/25/2020   PT End of Session - 11/25/20 1449    Visit Number 10    Number of Visits 18    Date for PT Re-Evaluation 12/14/20    Authorization Type MEDICAID [place] ACCESS    Authorization Time Period 11/04/20-12/15/20    Authorization - Visit Number 4    Authorization - Number of Visits 12    PT Start Time 1500    PT Stop Time 1541    PT Time Calculation (min) 41 min    Activity Tolerance Patient tolerated treatment well    Behavior During Therapy [facility] for tasks assessed/performed           Past Medical History:  Diagnosis Date  . Bipolar II disorder (HCC)   . Community acquired pneumonia of right upper lobe of lung 08/14/2017  . Depression   . GERD (gastroesophageal reflux disease)   . Near syncope   . Obesity, morbid (HCC)   . Osteoarthritis     Past Surgical History:  Procedure Laterality Date  . BICEPT TENODESIS Left 06/30/2020   Procedure: BICEPS TENODESIS;  Surgeon: Teryl Lucy, MD;  Location: [facility];  Service: Orthopedics;  Laterality: Left;  . BONE SPURS     REMOVED FROM SHOULDERS  . EYE SURGERY     STY REMOVED LEFT EYE  1992  . ROTATOR CUFF REPAIR     BILATERAL  . SHOULDER ARTHROSCOPY WITH DISTAL CLAVICLE RESECTION Left 06/30/2020   Procedure: SHOULDER ARTHROSCOPY WITH DISTAL CLAVICLE RESECTION;  Surgeon: Teryl Lucy, MD;  Location: [facility];  Service: Orthopedics;  Laterality: Left;  . SHOULDER ARTHROSCOPY WITH ROTATOR CUFF REPAIR AND SUBACROMIAL DECOMPRESSION Left 06/30/2020   Procedure: LEFT SHOULDER ARTHROSCOPY DEBRIDEMENT, ACROMIOPLASTY, DISTAL CLAVICLE EXCISION,ROTATOR CUFF REPAIR, BICEPS TENODESIS;   Surgeon: Teryl Lucy, MD;  Location: [facility];  Service: Orthopedics;  Laterality: Left;  . TOTAL HIP ARTHROPLASTY  08/15/2011   Procedure: TOTAL HIP ARTHROPLASTY;  Surgeon: Eulas Post;  Location: [facility];  Service: Orthopedics;  Laterality: Right;  . TOTAL HIP ARTHROPLASTY Left 02/07/2017  . TOTAL HIP ARTHROPLASTY Left 02/07/2017   Procedure: LEFT TOTAL HIP ARTHROPLASTY;  Surgeon: Teryl Lucy, MD;  Location: [facility];  Service: Orthopedics;  Laterality: Left;    There were no vitals filed for this visit.   Subjective Assessment - 11/25/20 1459    Subjective "Doing alright a lot of pulling up in here" (points to upper trap). She has MRA scheduled 12/11/20 and f/u with surgeon on 12/14/20.    Patient Stated Goals To reach up above head  to take care of items in home    Currently in Pain? Yes    Pain Score 7     Pain Location Shoulder    Pain Orientation Left    Pain Descriptors / Indicators Aching;Pins and needles    Pain Type Chronic pain    Pain Onset More than a month ago              [facility] PT Assessment - 11/25/20 0001      AROM   Left Shoulder Flexion 145 Degrees    Left Shoulder ABduction 97 Degrees    Left Shoulder Internal Rotation 40 Degrees  Left Shoulder External Rotation 45 Degrees      PROM   Left Shoulder Flexion 155 Degrees    Left Shoulder ABduction 141 Degrees                         [facility] Adult PT Treatment/Exercise - 11/25/20 0001      Shoulder Exercises: Supine   Flexion 10 reps    Theraband Level (Shoulder Flexion) Level 1 (Yellow)    Flexion Limitations x 2; left      Shoulder Exercises: Sidelying   ABduction Limitations 2 x 10;1# left      Shoulder Exercises: Standing   External Rotation 10 reps    Theraband Level (Shoulder External Rotation) Level 2 (Red)    External Rotation Weight (lbs) x2; Left    Internal Rotation 10 reps    Theraband Level (Shoulder Internal Rotation) Level 2 (Red)    Internal  Rotation Weight (lbs) x2; left      Shoulder Exercises: ROM/Strengthening   UBE (Upper Arm Bike) level 2; 2 min each fwd/bwd    Other ROM/Strengthening Exercises finger ladder flexion and abduction 1 x 5 each      Manual Therapy   Passive ROM PROM all planes Lt shoulder to tolerance                    PT Short Term Goals - 10/08/20 1530      PT SHORT TERM GOAL #1   Title Pt will be Ind in an initial HEP.    Status Achieved    Target Date 09/22/20      PT SHORT TERM GOAL #2   Title Pt will voice understanding of measures to reduce and manage her L shoulder pain. Achieved-Pt is managing per cold packs and rest    Status Achieved    Target Date 09/22/20             PT Long Term Goals - 11/02/20 1435      PT LONG TERM GOAL #1   Title Improve L shoulder strength to 4+ to 5/5 for functional use with ADLs activites to shoulder height levels    Baseline deferred due to ROM limitations    Time 6    Period Weeks    Status Deferred    Target Date 12/14/20      PT LONG TERM GOAL #2   Title Pt will demonstrate L shoulder AROMs to within 90% of the R shoulder for improved functiona use of the L UE    Baseline ER 25, Flexion138, Abduction 84    Time 6    Period Weeks    Status On-going    Target Date 12/14/20      PT LONG TERM GOAL #3   Title Pt will report a reduction of L shoulder pain to 4/10 or less with daily activities    Baseline 8/10 at worst    Time 6    Period Weeks    Status On-going    Target Date 12/14/20                 Plan - 11/25/20 1512    Clinical Impression Statement Patient's A/PROM continues to gradually improve, though limitations and pain remain with all planes. Improved tolerance to manual therapy with no guarding during PROM, though remains limited secondary to pain at available end range. Though patient continues to report high pain levels at beginning of session she tolerated strengthening/ROM  progression well without increased  reports of pain.    Personal Factors and Comorbidities Comorbidity 3+;Time since onset of injury/illness/exacerbation;Fitness    Comorbidities OA, obesity, depression, bipolar ll    Examination-Activity Limitations Bathing;Carry;Dressing;Lift;Reach Overhead    Examination-Participation Restrictions Cleaning;Driving    Stability/Clinical Decision Making Stable/Uncomplicated    Rehab Potential Good    PT Frequency 2x / week    PT Duration 6 weeks    PT Treatment/Interventions ADLs/Self Care Home Management;Electrical Stimulation;Iontophoresis 4mg /ml Dexamethasone;Moist Heat;Traction;Ultrasound;Therapeutic exercise;Therapeutic activities;Patient/family education;Manual techniques;Dry needling;Passive range of motion;Taping;Vasopneumatic Device;Joint Manipulations    PT Next Visit Plan update HEP. Progress ROM as tolerated. Progress periscapular and GH strength as tolerated. Continue manual therapy.    PT Home Exercise Plan [id number]    Consulted and Agree with Plan of Care Patient           Patient will benefit from skilled therapeutic intervention in order to improve the following deficits and impairments:  Decreased strength,Increased edema,Pain,Obesity,Impaired UE functional use,Decreased range of motion  Visit Diagnosis: Acute pain of left shoulder  S/P left rotator cuff repair  Decreased ROM of left shoulder  Muscle weakness (generalized)     Problem List Patient Active Problem List   Diagnosis Date Noted  . Encounter for screening for cervical cancer 07/15/2020  . Malodorous urine 07/15/2020  . Chronic GERD 03/19/2020  . Syncope 03/14/2020  . Dyspnea on exertion 03/11/2020  . Dizziness 03/11/2020  . Adhesive capsulitis of right shoulder 01/02/2020  . High risk social situation 03/26/2019  . Cramp in muscle 12/11/2018  . TSH deficiency 10/12/2018  . Encounter for screening mammogram for malignant neoplasm of breast 10/09/2018  . Unintentional weight loss 10/09/2018  .  Prediabetes 10/08/2018  . Possible exposure to STD 06/13/2018  . Weight loss 06/13/2018  . Lower respiratory infection 12/21/2017  . Foot pain, bilateral 10/27/2017  . Routine screening for STI (sexually transmitted infection) 10/27/2017  . Gait instability 10/27/2017  . Lesion of stomach 09/07/2017  . Nonspecific abnormal findings on imaging of lung 08/21/2017  . Mood disorder (HCC)   . Chronic pain syndrome   . Anxiety state 03/07/2017  . Primary localized osteoarthritis of left hip 02/07/2017  . Fibromyalgia 05/15/2014  . Colonoscopy refused 05/15/2014  . Healthcare maintenance 05/15/2014  . Schizoaffective disorder (HCC) 12/13/2012  . Other and unspecified hyperlipidemia 12/13/2012  . Plantar fasciitis 04/16/2012  . Primary osteoarthritis of right hip 08/15/2011  . Obesity, morbid (HCC)   . Bipolar 2 disorder (HCC) 06/15/2011  . TOBACCO DEPENDENCE 11/30/2006  . OSTEOARTHRITIS OF SPINE, NOS 11/30/2006  . INCONTINENCE, URGE 11/30/2006   12/02/2006, PT, DPT, ATC 11/25/20 3:47 PM  [facility] [address] [place], [place], [place] Phone: [phone]   Fax:  [phone]  Name: Bridget [name] MRN: Margaretann Loveless Date of Birth: [birth year]

## 2020-12-01 ENCOUNTER — Ambulatory Visit (INDEPENDENT_AMBULATORY_CARE_PROVIDER_SITE_OTHER): Payer: Medicaid Other | Admitting: Psychiatry

## 2020-12-01 ENCOUNTER — Other Ambulatory Visit: Payer: Self-pay

## 2020-12-01 ENCOUNTER — Encounter (HOSPITAL_COMMUNITY): Payer: Self-pay | Admitting: Psychiatry

## 2020-12-01 DIAGNOSIS — F4310 Post-traumatic stress disorder, unspecified: Secondary | ICD-10-CM
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
## 2020-12-04 ENCOUNTER — Ambulatory Visit: Payer: Medicaid Other

## 2020-12-07 ENCOUNTER — Ambulatory Visit: Payer: Medicaid Other | Attending: Orthopedic Surgery

## 2020-12-07 ENCOUNTER — Other Ambulatory Visit: Payer: Self-pay

## 2020-12-07 DIAGNOSIS — Z9889 Other specified postprocedural states: Secondary | ICD-10-CM | POA: Insufficient documentation

## 2020-12-07 DIAGNOSIS — M25512 Pain in left shoulder: Secondary | ICD-10-CM | POA: Diagnosis present

## 2020-12-07 DIAGNOSIS — M25612 Stiffness of left shoulder, not elsewhere classified: Secondary | ICD-10-CM | POA: Insufficient documentation

## 2020-12-07 DIAGNOSIS — M6281 Muscle weakness (generalized): Secondary | ICD-10-CM | POA: Insufficient documentation

## 2020-12-07 NOTE — Therapy (Signed)
[facility] [address] [place], [place], [address] Phone: [phone]   Fax:  [phone]  Physical Therapy Treatment  Patient Details  Name: Bridget [name] MRN: [id number] Date of Birth: 12-30-1960 Referring Provider (PT): Teryl Lucy, MD   Encounter Date: 12/07/2020   PT End of Session - 12/07/20 1503    Visit Number 11    Number of Visits 18    Date for PT Re-Evaluation 12/14/20    Authorization Type MEDICAID [place] ACCESS    Authorization Time Period 11/04/20-12/15/20    Authorization - Visit Number 5    Authorization - Number of Visits 12    PT Start Time 1500    PT Stop Time 1544    PT Time Calculation (min) 44 min    Activity Tolerance Patient tolerated treatment well    Behavior During Therapy [facility] for tasks assessed/performed           Past Medical History:  Diagnosis Date  . Bipolar II disorder (HCC)   . Community acquired pneumonia of right upper lobe of lung 08/14/2017  . Depression   . GERD (gastroesophageal reflux disease)   . Near syncope   . Obesity, morbid (HCC)   . Osteoarthritis     Past Surgical History:  Procedure Laterality Date  . BICEPT TENODESIS Left 06/30/2020   Procedure: BICEPS TENODESIS;  Surgeon: Teryl Lucy, MD;  Location: [facility];  Service: Orthopedics;  Laterality: Left;  . BONE SPURS     REMOVED FROM SHOULDERS  . EYE SURGERY     STY REMOVED LEFT EYE  1992  . ROTATOR CUFF REPAIR     BILATERAL  . SHOULDER ARTHROSCOPY WITH DISTAL CLAVICLE RESECTION Left 06/30/2020   Procedure: SHOULDER ARTHROSCOPY WITH DISTAL CLAVICLE RESECTION;  Surgeon: Teryl Lucy, MD;  Location: [facility];  Service: Orthopedics;  Laterality: Left;  . SHOULDER ARTHROSCOPY WITH ROTATOR CUFF REPAIR AND SUBACROMIAL DECOMPRESSION Left 06/30/2020   Procedure: LEFT SHOULDER ARTHROSCOPY DEBRIDEMENT, ACROMIOPLASTY, DISTAL CLAVICLE EXCISION,ROTATOR CUFF REPAIR, BICEPS TENODESIS;   Surgeon: Teryl Lucy, MD;  Location: [facility];  Service: Orthopedics;  Laterality: Left;  . TOTAL HIP ARTHROPLASTY  08/15/2011   Procedure: TOTAL HIP ARTHROPLASTY;  Surgeon: Eulas Post;  Location: [facility];  Service: Orthopedics;  Laterality: Right;  . TOTAL HIP ARTHROPLASTY Left 02/07/2017  . TOTAL HIP ARTHROPLASTY Left 02/07/2017   Procedure: LEFT TOTAL HIP ARTHROPLASTY;  Surgeon: Teryl Lucy, MD;  Location: [facility];  Service: Orthopedics;  Laterality: Left;    There were no vitals filed for this visit.   Subjective Assessment - 12/07/20 1501    Subjective "Still some pulling in the shoulder and it catches when I reach backwards, real painful still." MRA scheduled on 12/11/20 and f/u with surgeon on 12/14/20    Currently in Pain? Yes    Pain Score 7     Pain Location Shoulder    Pain Orientation Left    Pain Descriptors / Indicators Aching    Pain Type Chronic pain    Pain Onset More than a month ago    Pain Frequency Constant                             [facility] Adult PT Treatment/Exercise - 12/07/20 0001      Self-Care   Other Self-Care Comments  see patient education      Shoulder Exercises: Supine   Horizontal ABduction  10 reps    Theraband Level (Shoulder Horizontal ABduction) Level 2 (Red)    Horizontal ABduction Limitations x 2    Flexion 10 reps    Theraband Level (Shoulder Flexion) Level 1 (Yellow)    Flexion Limitations x2; left      Shoulder Exercises: Seated   External Rotation 15 reps    Theraband Level (Shoulder External Rotation) Level 1 (Yellow)    External Rotation Limitations x 2      Shoulder Exercises: Standing   Other Standing Exercises functional IR 1 x 10; 10 sec hold      Shoulder Exercises: Pulleys   Flexion 2 minutes      Shoulder Exercises: ROM/Strengthening   UBE (Upper Arm Bike) level 2; 2 min each fwd/bwd      Manual Therapy   Joint Mobilization Grade II-III inferior and A/P Lt GHJ    Passive ROM  PROM all planes Lt shoulder to tolerance                  PT Education - 12/07/20 1522    Education Details Education on updated HEP.    Person(s) Educated Patient    Methods Explanation;Demonstration;Verbal cues;Handout    Comprehension Verbalized understanding;Returned demonstration            PT Short Term Goals - 10/08/20 1530      PT SHORT TERM GOAL #1   Title Pt will be Ind in an initial HEP.    Status Achieved    Target Date 09/22/20      PT SHORT TERM GOAL #2   Title Pt will voice understanding of measures to reduce and manage her L shoulder pain. Achieved-Pt is managing per cold packs and rest    Status Achieved    Target Date 09/22/20             PT Long Term Goals - 11/02/20 1435      PT LONG TERM GOAL #1   Title Improve L shoulder strength to 4+ to 5/5 for functional use with ADLs activites to shoulder height levels    Baseline deferred due to ROM limitations    Time 6    Period Weeks    Status Deferred    Target Date 12/14/20      PT LONG TERM GOAL #2   Title Pt will demonstrate L shoulder AROMs to within 90% of the R shoulder for improved functiona use of the L UE    Baseline ER 25, Flexion138, Abduction 84    Time 6    Period Weeks    Status On-going    Target Date 12/14/20      PT LONG TERM GOAL #3   Title Pt will report a reduction of L shoulder pain to 4/10 or less with daily activities    Baseline 8/10 at worst    Time 6    Period Weeks    Status On-going    Target Date 12/14/20                 Plan - 12/07/20 1504    Clinical Impression Statement Overall good tolerance to today's session with ability to progress shoulder strengthening and update HEP to reflect additional strengthening exercises. Mild increased pain reported with passive shoulder abduction, otherwise tolerated PROM in other planes well.    Personal Factors and Comorbidities Comorbidity 3+;Time since onset of injury/illness/exacerbation;Fitness     Comorbidities OA, obesity, depression, bipolar ll    Examination-Activity Limitations Bathing;Carry;Dressing;Lift;Reach Overhead  Examination-Participation Restrictions Cleaning;Driving    Stability/Clinical Decision Making Stable/Uncomplicated    Rehab Potential Good    PT Frequency 2x / week    PT Duration 6 weeks    PT Treatment/Interventions ADLs/Self Care Home Management;Electrical Stimulation;Iontophoresis 4mg /ml Dexamethasone;Moist Heat;Traction;Ultrasound;Therapeutic exercise;Therapeutic activities;Patient/family education;Manual techniques;Dry needling;Passive range of motion;Taping;Vasopneumatic Device;Joint Manipulations    PT Next Visit Plan Progress ROM as tolerated. Progress periscapular and GH strength as tolerated. Continue manual therapy.    PT Home Exercise Plan [id number]    Consulted and Agree with Plan of Care Patient           Patient will benefit from skilled therapeutic intervention in order to improve the following deficits and impairments:  Decreased strength,Increased edema,Pain,Obesity,Impaired UE functional use,Decreased range of motion  Visit Diagnosis: Acute pain of left shoulder  S/P left rotator cuff repair  Decreased ROM of left shoulder  Muscle weakness (generalized)     Problem List Patient Active Problem List   Diagnosis Date Noted  . Encounter for screening for cervical cancer 07/15/2020  . Malodorous urine 07/15/2020  . Chronic GERD 03/19/2020  . Syncope 03/14/2020  . Dyspnea on exertion 03/11/2020  . Dizziness 03/11/2020  . Adhesive capsulitis of right shoulder 01/02/2020  . High risk social situation 03/26/2019  . Cramp in muscle 12/11/2018  . TSH deficiency 10/12/2018  . Encounter for screening mammogram for malignant neoplasm of breast 10/09/2018  . Unintentional weight loss 10/09/2018  . Prediabetes 10/08/2018  . Possible exposure to STD 06/13/2018  . Weight loss 06/13/2018  . Lower respiratory infection 12/21/2017  . Foot  pain, bilateral 10/27/2017  . Routine screening for STI (sexually transmitted infection) 10/27/2017  . Gait instability 10/27/2017  . Lesion of stomach 09/07/2017  . Nonspecific abnormal findings on imaging of lung 08/21/2017  . Mood disorder (HCC)   . Chronic pain syndrome   . Anxiety state 03/07/2017  . Primary localized osteoarthritis of left hip 02/07/2017  . Fibromyalgia 05/15/2014  . Colonoscopy refused 05/15/2014  . Healthcare maintenance 05/15/2014  . Schizoaffective disorder (HCC) 12/13/2012  . Other and unspecified hyperlipidemia 12/13/2012  . Plantar fasciitis 04/16/2012  . Primary osteoarthritis of right hip 08/15/2011  . Obesity, morbid (HCC)   . Bipolar 2 disorder (HCC) 06/15/2011  . TOBACCO DEPENDENCE 11/30/2006  . OSTEOARTHRITIS OF SPINE, NOS 11/30/2006  . INCONTINENCE, URGE 11/30/2006   12/02/2006, PT, DPT, ATC 12/07/20 3:46 PM  [facility] [address] [place], [place], [place] Phone: [phone]   Fax:  [phone]  Name: JAKI STEPTOE MRN: Margaretann Loveless Date of Birth: 1960-11-08

## 2020-12-08 ENCOUNTER — Ambulatory Visit (INDEPENDENT_AMBULATORY_CARE_PROVIDER_SITE_OTHER): Payer: Medicaid Other | Admitting: Psychiatry

## 2020-12-08 ENCOUNTER — Encounter (HOSPITAL_COMMUNITY): Payer: Self-pay | Admitting: Psychiatry

## 2020-12-08 DIAGNOSIS — F4310 Post-traumatic stress disorder, unspecified: Secondary | ICD-10-CM | POA: Diagnosis not present
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
## 2020-12-10 ENCOUNTER — Ambulatory Visit: Payer: Medicaid Other

## 2020-12-10 ENCOUNTER — Other Ambulatory Visit: Payer: Self-pay

## 2020-12-10 DIAGNOSIS — M6281 Muscle weakness (generalized): Secondary | ICD-10-CM

## 2020-12-10 DIAGNOSIS — M25612 Stiffness of left shoulder, not elsewhere classified: Secondary | ICD-10-CM

## 2020-12-10 DIAGNOSIS — Z9889 Other specified postprocedural states: Secondary | ICD-10-CM

## 2020-12-10 DIAGNOSIS — M25512 Pain in left shoulder: Secondary | ICD-10-CM

## 2020-12-10 NOTE — Therapy (Signed)
[facility] Outpatient Rehabilitation [facility] [address] [place], [place], [address] Phone: [phone]   Fax:  [phone]  Physical Therapy Treatment/Re-certification   Patient Details  Name: COREE RIESTER MRN: [id number] Date of Birth: 1961/06/26 Referring Provider (PT): Teryl Lucy, MD   Encounter Date: 12/10/2020   PT End of Session - 12/10/20 1457    Visit Number 12    Number of Visits 20    Date for PT Re-Evaluation 01/09/21    Authorization Type MEDICAID [place] ACCESS    Authorization Time Period 11/04/20-12/15/20    Authorization - Visit Number 6    Authorization - Number of Visits 12    PT Start Time 1500    PT Stop Time 1544    PT Time Calculation (min) 44 min    Activity Tolerance Patient tolerated treatment well    Behavior During Therapy [facility] for tasks assessed/performed           Past Medical History:  Diagnosis Date  . Bipolar II disorder (HCC)   . Community acquired pneumonia of right upper lobe of lung 08/14/2017  . Depression   . GERD (gastroesophageal reflux disease)   . Near syncope   . Obesity, morbid (HCC)   . Osteoarthritis     Past Surgical History:  Procedure Laterality Date  . BICEPT TENODESIS Left 06/30/2020   Procedure: BICEPS TENODESIS;  Surgeon: Teryl Lucy, MD;  Location: [facility];  Service: Orthopedics;  Laterality: Left;  . BONE SPURS     REMOVED FROM SHOULDERS  . EYE SURGERY     STY REMOVED LEFT EYE  1992  . ROTATOR CUFF REPAIR     BILATERAL  . SHOULDER ARTHROSCOPY WITH DISTAL CLAVICLE RESECTION Left 06/30/2020   Procedure: SHOULDER ARTHROSCOPY WITH DISTAL CLAVICLE RESECTION;  Surgeon: Teryl Lucy, MD;  Location: [facility];  Service: Orthopedics;  Laterality: Left;  . SHOULDER ARTHROSCOPY WITH ROTATOR CUFF REPAIR AND SUBACROMIAL DECOMPRESSION Left 06/30/2020   Procedure: LEFT SHOULDER ARTHROSCOPY DEBRIDEMENT, ACROMIOPLASTY, DISTAL CLAVICLE EXCISION,ROTATOR CUFF REPAIR,  BICEPS TENODESIS;  Surgeon: Teryl Lucy, MD;  Location: [facility];  Service: Orthopedics;  Laterality: Left;  . TOTAL HIP ARTHROPLASTY  08/15/2011   Procedure: TOTAL HIP ARTHROPLASTY;  Surgeon: Eulas Post;  Location: [facility];  Service: Orthopedics;  Laterality: Right;  . TOTAL HIP ARTHROPLASTY Left 02/07/2017  . TOTAL HIP ARTHROPLASTY Left 02/07/2017   Procedure: LEFT TOTAL HIP ARTHROPLASTY;  Surgeon: Teryl Lucy, MD;  Location: [facility];  Service: Orthopedics;  Laterality: Left;    There were no vitals filed for this visit.   Subjective Assessment - 12/10/20 1502    Subjective "I almost didn't come today because my back is bothering me." Patient reports her shoulder is feeling stiff today. She has MRA scheduled for tomorrow and f/u with surgeon on Monday. Patient reports subjective overall improvement of 60% stating she needs more help with reaching specifically reaching behind her back.    Currently in Pain? Yes    Pain Score 6     Pain Location Shoulder    Pain Orientation Left    Pain Descriptors / Indicators --   stiff   Pain Type Chronic pain    Pain Onset More than a month ago              [facility] PT Assessment - 12/10/20 0001      AROM   Left Shoulder Flexion 155 Degrees   166 AAROM   Left Shoulder ABduction  115 Degrees   150 AAROM   Left Shoulder Internal Rotation 58 Degrees    Left Shoulder External Rotation 30 Degrees      PROM   Overall PROM Comments empty end feel    Right Shoulder Internal Rotation 70 Degrees    Right Shoulder External Rotation 35 Degrees    Left Shoulder Flexion 163 Degrees    Left Shoulder ABduction 140 Degrees      Strength   Overall Strength Comments 3-/5 in Lt shoulder given limited AROM                         [facility] Adult PT Treatment/Exercise - 12/10/20 0001      Self-Care   Other Self-Care Comments  see patient education      Shoulder Exercises: Standing   Flexion 5 reps   3 sets   Shoulder  Flexion Weight (lbs) 1    Flexion Limitations first set no weight, second and third  set 1#; 5 sec hold    ABduction 5 reps    ABduction Limitations x2 partial range    Other Standing Exercises functional IR 1 x 10; 10 sec hold    Other Standing Exercises ER AAROM with dowel 1 x 10; 5 sec hold      Shoulder Exercises: ROM/Strengthening   UBE (Upper Arm Bike) level 2; 2 min each fwd/bwd    Other ROM/Strengthening Exercises finger ladder flexion and abduction 1 x 5 each      Shoulder Exercises: Stretch   Other Shoulder Stretches Doorway stretch ER 3 x 20 sec      Manual Therapy   Joint Mobilization Grade II-III inferior and A/P Lt GHJ    Passive ROM PROM all planes Lt shoulder to tolerance                  PT Education - 12/10/20 1646    Education Details Education on re-assessment findings and POC moving forward. Recommended to discuss ongoing pain and ROM limitation with surgeon during scheduled f/u on Monday. Updated HEP.    Person(s) Educated Patient    Methods Explanation;Demonstration;Verbal cues;Handout    Comprehension Verbalized understanding;Returned demonstration            PT Short Term Goals - 10/08/20 1530      PT SHORT TERM GOAL #1   Title Pt will be Ind in an initial HEP.    Status Achieved    Target Date 09/22/20      PT SHORT TERM GOAL #2   Title Pt will voice understanding of measures to reduce and manage her L shoulder pain. Achieved-Pt is managing per cold packs and rest    Status Achieved    Target Date 09/22/20             PT Long Term Goals - 12/10/20 1519      PT LONG TERM GOAL #1   Title Improve L shoulder strength to 4+ to 5/5 for functional use with ADLs activites to shoulder height levels    Baseline 3-/5 Lt shoulder    Time 6    Period Weeks    Status On-going      PT LONG TERM GOAL #2   Title Pt will demonstrate L shoulder AROMs to within 90% of the R shoulder for improved functiona use of the L UE    Baseline ER 30,IR 58,  Flexion155, Abduction 115    Time 6    Period  Weeks    Status On-going      PT LONG TERM GOAL #3   Title Pt will report a reduction of L shoulder pain to 4/10 or less with daily activities    Baseline 6/10 current    Time 6    Period Weeks    Status On-going                 Plan - 12/10/20 1534    Clinical Impression Statement Patient is making slow, but gradual progress in her Lt shoulder functional mobility s/p Lt RCR on 06/30/20. Her PROM has improved in all planes with exception of abduction as this remains unchanged since last assessment, though she is able to achieve further abduction ROM during AAROM activity. Pain is limiting factor in achieiving further PROM. Her AROM has improved in all planes with the exception of ER as this has regressed since last assessment. Though she continues to report moderate pain levels during session she is tolerating progression of shoulder strengthening and ROM well. She would benefit from continued PT to further progress her strength and ROM as she is working well towards her established functional goals. It was also recommended that she discuss ongoing pain and current ROM limitations with surgeon during her f/u visit on Monday given how far out she is from surgery.    Personal Factors and Comorbidities Comorbidity 3+;Time since onset of injury/illness/exacerbation;Fitness    Comorbidities OA, obesity, depression, bipolar ll    Examination-Activity Limitations Bathing;Carry;Dressing;Lift;Reach Overhead    Examination-Participation Restrictions Cleaning;Driving    Stability/Clinical Decision Making Stable/Uncomplicated    Rehab Potential Good    PT Frequency --   1-2/week   PT Duration 4 weeks    PT Treatment/Interventions ADLs/Self Care Home Management;Electrical Stimulation;Iontophoresis 4mg /ml Dexamethasone;Moist Heat;Traction;Ultrasound;Therapeutic exercise;Therapeutic activities;Patient/family education;Manual techniques;Dry needling;Passive  range of motion;Taping;Vasopneumatic Device;Joint Manipulations    PT Next Visit Plan Progress ROM as tolerated. Progress periscapular and GH strength as tolerated.    PT Home Exercise Plan [id number]    Consulted and Agree with Plan of Care Patient           Patient will benefit from skilled therapeutic intervention in order to improve the following deficits and impairments:  Decreased strength,Increased edema,Pain,Obesity,Impaired UE functional use,Decreased range of motion  Visit Diagnosis: Acute pain of left shoulder  S/P left rotator cuff repair  Decreased ROM of left shoulder  Muscle weakness (generalized)     Problem List Patient Active Problem List   Diagnosis Date Noted  . Encounter for screening for cervical cancer 07/15/2020  . Malodorous urine 07/15/2020  . Chronic GERD 03/19/2020  . Syncope 03/14/2020  . Dyspnea on exertion 03/11/2020  . Dizziness 03/11/2020  . Adhesive capsulitis of right shoulder 01/02/2020  . High risk social situation 03/26/2019  . Cramp in muscle 12/11/2018  . TSH deficiency 10/12/2018  . Encounter for screening mammogram for malignant neoplasm of breast 10/09/2018  . Unintentional weight loss 10/09/2018  . Prediabetes 10/08/2018  . Possible exposure to STD 06/13/2018  . Weight loss 06/13/2018  . Lower respiratory infection 12/21/2017  . Foot pain, bilateral 10/27/2017  . Routine screening for STI (sexually transmitted infection) 10/27/2017  . Gait instability 10/27/2017  . Lesion of stomach 09/07/2017  . Nonspecific abnormal findings on imaging of lung 08/21/2017  . Mood disorder (HCC)   . Chronic pain syndrome   . Anxiety state 03/07/2017  . Primary localized osteoarthritis of left hip 02/07/2017  . Fibromyalgia 05/15/2014  . Colonoscopy refused 05/15/2014  .  Healthcare maintenance 05/15/2014  . Schizoaffective disorder (HCC) 12/13/2012  . Other and unspecified hyperlipidemia 12/13/2012  . Plantar fasciitis 04/16/2012  .  Primary osteoarthritis of right hip 08/15/2011  . Obesity, morbid (HCC)   . Bipolar 2 disorder (HCC) 06/15/2011  . TOBACCO DEPENDENCE 11/30/2006  . OSTEOARTHRITIS OF SPINE, NOS 11/30/2006  . INCONTINENCE, URGE 11/30/2006   [name] LibraSamantha Terecia Plaut, PT, DPT, ATC 12/10/20 4:54 PM  [facility] [address] [place], [place], [address] Phone: [phone]   Fax:  [phone]  Name: Margaretann Lovelesslice F Montelongo MRN: [id number] Date of Birth: 08/03/1961

## 2020-12-11 ENCOUNTER — Ambulatory Visit
Admission: RE | Admit: 2020-12-11 | Discharge: 2020-12-11 | Disposition: A | Discharge status: Home / Self Care | Payer: Medicaid Other | Source: Ambulatory Visit | Attending: Orthopedic Surgery | Admitting: Orthopedic Surgery

## 2020-12-11 DIAGNOSIS — M25512 Pain in left shoulder: Secondary | ICD-10-CM

## 2020-12-11 MED ORDER — IOPAMIDOL (ISOVUE-M 200) INJECTION 41%
12.0000 mL | Freq: Once | INTRAMUSCULAR | Status: AC
  Administered 2020-12-11: 12 mL via INTRA_ARTICULAR

## 2020-12-14 ENCOUNTER — Other Ambulatory Visit: Payer: Self-pay

## 2020-12-14 ENCOUNTER — Ambulatory Visit: Payer: Medicaid Other

## 2020-12-14 DIAGNOSIS — M6281 Muscle weakness (generalized): Secondary | ICD-10-CM

## 2020-12-14 DIAGNOSIS — M25512 Pain in left shoulder: Secondary | ICD-10-CM | POA: Diagnosis not present

## 2020-12-14 DIAGNOSIS — Z9889 Other specified postprocedural states: Secondary | ICD-10-CM

## 2020-12-14 DIAGNOSIS — M25612 Stiffness of left shoulder, not elsewhere classified: Secondary | ICD-10-CM

## 2020-12-14 NOTE — Therapy (Signed)
[facility] Outpatient Rehabilitation [facility] [address] [place], [place], [address] Phone: [phone]   Fax:  [phone]  Physical Therapy Treatment  Patient Details  Name: Bridget [name] MRN: [id number] Date of Birth: 01-12-1961 Referring Provider (PT): Teryl Lucy, MD   Encounter Date: 12/14/2020   PT End of Session - 12/14/20 1543    Visit Number 13    Number of Visits 20    Date for PT Re-Evaluation 01/09/21    Authorization Type MEDICAID [place] ACCESS    Authorization Time Period 11/04/20-12/15/20    Authorization - Visit Number 7    Authorization - Number of Visits 12    PT Start Time 1500    PT Stop Time 1542    PT Time Calculation (min) 42 min    Activity Tolerance Patient tolerated treatment well    Behavior During Therapy [facility] for tasks assessed/performed           Past Medical History:  Diagnosis Date  . Bipolar II disorder (HCC)   . Community acquired pneumonia of right upper lobe of lung 08/14/2017  . Depression   . GERD (gastroesophageal reflux disease)   . Near syncope   . Obesity, morbid (HCC)   . Osteoarthritis     Past Surgical History:  Procedure Laterality Date  . BICEPT TENODESIS Left 06/30/2020   Procedure: BICEPS TENODESIS;  Surgeon: Teryl Lucy, MD;  Location: [facility];  Service: Orthopedics;  Laterality: Left;  . BONE SPURS     REMOVED FROM SHOULDERS  . EYE SURGERY     STY REMOVED LEFT EYE  1992  . ROTATOR CUFF REPAIR     BILATERAL  . SHOULDER ARTHROSCOPY WITH DISTAL CLAVICLE RESECTION Left 06/30/2020   Procedure: SHOULDER ARTHROSCOPY WITH DISTAL CLAVICLE RESECTION;  Surgeon: Teryl Lucy, MD;  Location: [facility];  Service: Orthopedics;  Laterality: Left;  . SHOULDER ARTHROSCOPY WITH ROTATOR CUFF REPAIR AND SUBACROMIAL DECOMPRESSION Left 06/30/2020   Procedure: LEFT SHOULDER ARTHROSCOPY DEBRIDEMENT, ACROMIOPLASTY, DISTAL CLAVICLE EXCISION,ROTATOR CUFF REPAIR, BICEPS TENODESIS;   Surgeon: Teryl Lucy, MD;  Location: [facility];  Service: Orthopedics;  Laterality: Left;  . TOTAL HIP ARTHROPLASTY  08/15/2011   Procedure: TOTAL HIP ARTHROPLASTY;  Surgeon: Eulas Post;  Location: [facility];  Service: Orthopedics;  Laterality: Right;  . TOTAL HIP ARTHROPLASTY Left 02/07/2017  . TOTAL HIP ARTHROPLASTY Left 02/07/2017   Procedure: LEFT TOTAL HIP ARTHROPLASTY;  Surgeon: Teryl Lucy, MD;  Location: [facility];  Service: Orthopedics;  Laterality: Left;    There were no vitals filed for this visit.   Subjective Assessment - 12/14/20 1504    Subjective Patient had f/u with Dr. Dion Saucier today and per patient the tendon is still attached to the bone, but it's thin. Dr. Dion Saucier recommends continuing with PT and f/u with pain management.    Currently in Pain? Yes    Pain Score 6     Pain Location Shoulder    Pain Orientation Left    Pain Descriptors / Indicators --   pulling   Pain Type Chronic pain                             [facility] Adult PT Treatment/Exercise - 12/14/20 0001      Shoulder Exercises: Supine   Other Supine Exercises chest press dowel with 2 lbs 3 x 10      Shoulder Exercises: Sidelying   External Rotation Limitations  2 x 15; 1lb LUE      Shoulder Exercises: Standing   Flexion 10 reps    Shoulder Flexion Weight (lbs) 1 and 2    Flexion Limitations x2 LUE; 1 lb first set, 2 lb second set    ABduction 10 reps    Shoulder ABduction Weight (lbs) 1    ABduction Limitations x2 LUE; partial range    Other Standing Exercises bicep curls red theraband 2 x 15      Manual Therapy   Joint Mobilization Grade II-III inferior and A/P Lt GHJ    Passive ROM PROM all planes Lt shoulder to tolerance                    PT Short Term Goals - 10/08/20 1530      PT SHORT TERM GOAL #1   Title Pt will be Ind in an initial HEP.    Status Achieved    Target Date 09/22/20      PT SHORT TERM GOAL #2   Title Pt will voice  understanding of measures to reduce and manage her L shoulder pain. Achieved-Pt is managing per cold packs and rest    Status Achieved    Target Date 09/22/20             PT Long Term Goals - 12/10/20 1519      PT LONG TERM GOAL #1   Title Improve L shoulder strength to 4+ to 5/5 for functional use with ADLs activites to shoulder height levels    Baseline 3-/5 Lt shoulder    Time 6    Period Weeks    Status On-going      PT LONG TERM GOAL #2   Title Pt will demonstrate L shoulder AROMs to within 90% of the R shoulder for improved functiona use of the L UE    Baseline ER 30,IR 58, Flexion155, Abduction 115    Time 6    Period Weeks    Status On-going      PT LONG TERM GOAL #3   Title Pt will report a reduction of L shoulder pain to 4/10 or less with daily activities    Baseline 6/10 current    Time 6    Period Weeks    Status On-going                 Plan - 12/14/20 1526    Clinical Impression Statement Patient had f/u with Dr. Dion Saucier today who recommended continuing PT at this time. Patient tolerating progression of strengthening well with ability to complete resisted flexion through partial ROM without compensation ,though has more difficulty with resisted abduction requiring cues to decrease compensatory trunk movement through partial ROM. She continues to be limited in PROM due to pain most notable into ER. Overall good tolerance to today's session, though patient reported feeling tired multiple times throughout session.    Personal Factors and Comorbidities Comorbidity 3+;Time since onset of injury/illness/exacerbation;Fitness    Comorbidities OA, obesity, depression, bipolar ll    Examination-Activity Limitations Bathing;Carry;Dressing;Lift;Reach Overhead    Examination-Participation Restrictions Cleaning;Driving    Stability/Clinical Decision Making Stable/Uncomplicated    Rehab Potential Good    PT Frequency --   1-2/week   PT Duration 4 weeks    PT  Treatment/Interventions ADLs/Self Care Home Management;Electrical Stimulation;Iontophoresis 4mg /ml Dexamethasone;Moist Heat;Traction;Ultrasound;Therapeutic exercise;Therapeutic activities;Patient/family education;Manual techniques;Dry needling;Passive range of motion;Taping;Vasopneumatic Device;Joint Manipulations    PT Next Visit Plan Progress ROM as tolerated. Progress periscapular and GH  strength as tolerated.    PT Home Exercise Plan [id number]    Consulted and Agree with Plan of Care Patient           Patient will benefit from skilled therapeutic intervention in order to improve the following deficits and impairments:  Decreased strength,Increased edema,Pain,Obesity,Impaired UE functional use,Decreased range of motion  Visit Diagnosis: Acute pain of left shoulder  S/P left rotator cuff repair  Decreased ROM of left shoulder  Muscle weakness (generalized)     Problem List Patient Active Problem List   Diagnosis Date Noted  . Encounter for screening for cervical cancer 07/15/2020  . Malodorous urine 07/15/2020  . Chronic GERD 03/19/2020  . Syncope 03/14/2020  . Dyspnea on exertion 03/11/2020  . Dizziness 03/11/2020  . Adhesive capsulitis of right shoulder 01/02/2020  . High risk social situation 03/26/2019  . Cramp in muscle 12/11/2018  . TSH deficiency 10/12/2018  . Encounter for screening mammogram for malignant neoplasm of breast 10/09/2018  . Unintentional weight loss 10/09/2018  . Prediabetes 10/08/2018  . Possible exposure to STD 06/13/2018  . Weight loss 06/13/2018  . Lower respiratory infection 12/21/2017  . Foot pain, bilateral 10/27/2017  . Routine screening for STI (sexually transmitted infection) 10/27/2017  . Gait instability 10/27/2017  . Lesion of stomach 09/07/2017  . Nonspecific abnormal findings on imaging of lung 08/21/2017  . Mood disorder (HCC)   . Chronic pain syndrome   . Anxiety state 03/07/2017  . Primary localized osteoarthritis of left hip  02/07/2017  . Fibromyalgia 05/15/2014  . Colonoscopy refused 05/15/2014  . Healthcare maintenance 05/15/2014  . Schizoaffective disorder (HCC) 12/13/2012  . Other and unspecified hyperlipidemia 12/13/2012  . Plantar fasciitis 04/16/2012  . Primary osteoarthritis of right hip 08/15/2011  . Obesity, morbid (HCC)   . Bipolar 2 disorder (HCC) 06/15/2011  . TOBACCO DEPENDENCE 11/30/2006  . OSTEOARTHRITIS OF SPINE, NOS 11/30/2006  . INCONTINENCE, URGE 11/30/2006   [name] Libra, PT, DPT, ATC 12/14/20 3:46 PM  [facility] Outpatient Rehabilitation [facility] [address] [place], [place], [address] Phone: [phone]   Fax:  [phone]  Name: Bridget [name] MRN: [id number] Date of Birth: Jun 14, 1961

## 2020-12-15 ENCOUNTER — Ambulatory Visit (HOSPITAL_COMMUNITY): Payer: Medicaid Other | Admitting: Psychiatry

## 2020-12-17 ENCOUNTER — Ambulatory Visit: Payer: Medicaid Other

## 2020-12-22 ENCOUNTER — Other Ambulatory Visit: Payer: Self-pay

## 2020-12-22 ENCOUNTER — Ambulatory Visit (INDEPENDENT_AMBULATORY_CARE_PROVIDER_SITE_OTHER): Payer: Medicaid Other | Admitting: Psychiatry

## 2020-12-22 ENCOUNTER — Encounter (HOSPITAL_COMMUNITY): Payer: Self-pay | Admitting: Psychiatry

## 2020-12-22 DIAGNOSIS — F4310 Post-traumatic stress disorder, unspecified: Secondary | ICD-10-CM

## 2020-12-22 DIAGNOSIS — F331 Major depressive disorder, recurrent, moderate: Secondary | ICD-10-CM | POA: Diagnosis not present

## 2020-12-22 NOTE — Progress Notes (Signed)
Virtual Visit via Video Note  I connected with Bridget [name] on 73/41/93 at  2:30 PM EDT by a video enabled telemedicine application and verified that I am speaking with the correct person using two identifiers.  Location: Patient:HOME Provider:OFFICE  History of Present Illness: MDD and PTSD  Treatment Plan Goals: 1) Bridget [name] would like to process grief and loss experiences to decrease depressive symptoms. 2) Bridget [name] would like to process and heal from trauma experiences to decrease instances of trauma triggers/reactions with impact her daily functioning and fulfillment in relationships.  Observations/Objective: Counselor met with Client for individual therapyvia webex. Counselor assessed MH symptoms and progress on treatment plan goals, with patient reporting_  Client presents withmoderatedepression and moderateanxiety. Client denied suicidal ideation or self-harm behaviors.  Goal 1)Counselor assessed grief and loss reminders/triggers and their impact on depression since our last session, using CBT interventions.Client shared about 3 specific grief and loss reminders experiences since our last session. Counselor validated feelings and experiences, reviewing grief and loss work interventions to allow space for healing and to address loss in a compassionate manner. Client receptive to engaging practices.   Goal2)Counselor processed and assessed trauma triggers and reminders associated with her relationship with her former husband.Client noted that she experienced minimal trauma triggers over the past week. Client shared that she handled a situation that would normally trigger her into anger, in a more calm manner this past week. Counselor celebrated her successful application of coping strategies.   Assessment and Plan: Counselor will continue to meet with patient to address treatment plan goals. Patient will continue to follow recommendations of providers and implement  skills learned in session.  Follow Up Instructions: Counselor will send information for next session viae-mail.   The patient was advised to call back or seek an in-person evaluation if the symptoms worsen or if the condition fails to improve as anticipated.  I provided38mnutes of face-to-face time during this encounter.   BLise Auer LCSW

## 2020-12-29 ENCOUNTER — Ambulatory Visit (HOSPITAL_COMMUNITY): Payer: Medicaid Other | Admitting: Psychiatry

## 2020-12-29 ENCOUNTER — Ambulatory Visit (INDEPENDENT_AMBULATORY_CARE_PROVIDER_SITE_OTHER): Payer: Medicaid Other | Admitting: Psychiatry

## 2020-12-29 ENCOUNTER — Other Ambulatory Visit: Payer: Self-pay

## 2020-12-29 DIAGNOSIS — F4310 Post-traumatic stress disorder, unspecified: Secondary | ICD-10-CM | POA: Diagnosis not present

## 2020-12-29 DIAGNOSIS — F331 Major depressive disorder, recurrent, moderate: Secondary | ICD-10-CM

## 2020-12-29 NOTE — Progress Notes (Signed)
Virtual Visit via Video Note  I connected with Bridget [name] on 14/97/02 at  3:30 PM EDT by a video enabled telemedicine application and verified that I am speaking with the correct person using two identifiers.  Location: Patient:HOME Provider:OFFICE  History of Present Illness: MDD and PTSD  Treatment Plan Goals: 1) Bridget [name] would like to process grief and loss experiences to decrease depressive symptoms. 2) Bridget [name] would like to process and heal from trauma experiences to decrease instances of trauma triggers/reactions with impact her daily functioning and fulfillment in relationships.  Observations/Objective: Counselor met with Client for individual therapyvia webex. Counselor assessed MH symptoms and progress on treatment plan goals, with patient reportingthat shr is making progress in communicating needs within her support system. Client presents withmoderatedepression and moderateanxiety. Client denied suicidal ideation or self-harm behaviors.  Goal 1)Counselor assessed grief and loss reminders/triggers and their impact on depression since our last session, using CBT interventions.Client noted that she was able to spend time with former husband where they were able to discuss and health from past losses associated with their divorce and separation. Client reports feeling good about the conversation and shared how she applied a variety of skills discussed in session when she was being triggered. Counselor praised client for application of skills and speaking her truth in the situation.  Goal2)Counselor processed and assessed trauma triggers and reminders associated with her relationship with her former husband.Client noted that she experienced minimal trauma triggers over the past week despite spending more time with former husband. Counselor provided psychoeducation on gradual exposure and coping strategies for grounding and mindfulness when triggered.   Assessment and  Plan: Counselor will continue to meet with patient to address treatment plan goals. Patient will continue to follow recommendations of providers and implement skills learned in session.  Follow Up Instructions: Counselor will send information for next session viae-mail.   The patient was advised to call back or seek an in-person evaluation if the symptoms worsen or if the condition fails to improve as anticipated.  I provided22mnutes of face-to-face time during this encounter.   BLise Auer LCSW

## 2020-12-30 ENCOUNTER — Ambulatory Visit: Payer: Medicaid Other

## 2020-12-31 ENCOUNTER — Telehealth: Payer: Self-pay

## 2020-12-31 NOTE — Telephone Encounter (Signed)
Spoke with pt. Pr reports she thought her appt was for today. Offered pt an appt time for later today, but it did work for the pt.

## 2021-01-01 ENCOUNTER — Encounter (HOSPITAL_COMMUNITY): Payer: Self-pay | Admitting: Psychiatry

## 2021-01-05 ENCOUNTER — Other Ambulatory Visit: Payer: Self-pay

## 2021-01-05 ENCOUNTER — Ambulatory Visit: Payer: Medicaid Other | Attending: Orthopedic Surgery

## 2021-01-05 DIAGNOSIS — M6281 Muscle weakness (generalized): Secondary | ICD-10-CM | POA: Diagnosis present

## 2021-01-05 DIAGNOSIS — M25512 Pain in left shoulder: Secondary | ICD-10-CM | POA: Diagnosis not present

## 2021-01-05 DIAGNOSIS — Z9889 Other specified postprocedural states: Secondary | ICD-10-CM | POA: Insufficient documentation

## 2021-01-05 DIAGNOSIS — M25612 Stiffness of left shoulder, not elsewhere classified: Secondary | ICD-10-CM | POA: Insufficient documentation

## 2021-01-05 NOTE — Therapy (Signed)
[facility] [address] [place], [place], [address] Phone: [phone]   Fax:  [phone]  Physical Therapy Treatment  Patient Details  Name: Bridget [name] MRN: [id number] Date of Birth: 09/18/1961 Referring Provider (PT): Teryl Lucy, MD   Encounter Date: 01/05/2021   PT End of Session - 01/05/21 1458    Visit Number 14    Number of Visits 20    Date for PT Re-Evaluation 01/09/21    Authorization Type MEDICAID [place] ACCESS    Authorization Time Period 3/17-4/13/22    Authorization - Visit Number 1    Authorization - Number of Visits 4    PT Start Time 1500    PT Stop Time 1543    PT Time Calculation (min) 43 min    Activity Tolerance Patient tolerated treatment well    Behavior During Therapy [facility] for tasks assessed/performed           Past Medical History:  Diagnosis Date  . Bipolar II disorder (HCC)   . Community acquired pneumonia of right upper lobe of lung 08/14/2017  . Depression   . GERD (gastroesophageal reflux disease)   . Near syncope   . Obesity, morbid (HCC)   . Osteoarthritis     Past Surgical History:  Procedure Laterality Date  . BICEPT TENODESIS Left 06/30/2020   Procedure: BICEPS TENODESIS;  Surgeon: Teryl Lucy, MD;  Location: [facility];  Service: Orthopedics;  Laterality: Left;  . BONE SPURS     REMOVED FROM SHOULDERS  . EYE SURGERY     STY REMOVED LEFT EYE  1992  . ROTATOR CUFF REPAIR     BILATERAL  . SHOULDER ARTHROSCOPY WITH DISTAL CLAVICLE RESECTION Left 06/30/2020   Procedure: SHOULDER ARTHROSCOPY WITH DISTAL CLAVICLE RESECTION;  Surgeon: Teryl Lucy, MD;  Location: [facility];  Service: Orthopedics;  Laterality: Left;  . SHOULDER ARTHROSCOPY WITH ROTATOR CUFF REPAIR AND SUBACROMIAL DECOMPRESSION Left 06/30/2020   Procedure: LEFT SHOULDER ARTHROSCOPY DEBRIDEMENT, ACROMIOPLASTY, DISTAL CLAVICLE EXCISION,ROTATOR CUFF REPAIR, BICEPS TENODESIS;   Surgeon: Teryl Lucy, MD;  Location: [facility];  Service: Orthopedics;  Laterality: Left;  . TOTAL HIP ARTHROPLASTY  08/15/2011   Procedure: TOTAL HIP ARTHROPLASTY;  Surgeon: Eulas Post;  Location: [facility];  Service: Orthopedics;  Laterality: Right;  . TOTAL HIP ARTHROPLASTY Left 02/07/2017  . TOTAL HIP ARTHROPLASTY Left 02/07/2017   Procedure: LEFT TOTAL HIP ARTHROPLASTY;  Surgeon: Teryl Lucy, MD;  Location: [facility];  Service: Orthopedics;  Laterality: Left;    There were no vitals filed for this visit.   Subjective Assessment - 01/05/21 1503    Subjective Patient reports the shoulder has been feeling about the same. Missed last appointment due to her whole body aching. She has f/u with Dr. Dion Saucier at the end of the month. She reports compliance with HEP.    Currently in Pain? Yes    Pain Score 8     Pain Location Shoulder    Pain Orientation Left    Pain Descriptors / Indicators --   stiffness   Pain Type Chronic pain              [facility] PT Assessment - 01/05/21 0001      AROM   Left Shoulder Flexion 160 Degrees    Left Shoulder ABduction 108 Degrees    Left Shoulder Internal Rotation 59 Degrees    Left Shoulder External Rotation 25 Degrees  [facility] Adult PT Treatment/Exercise - 01/05/21 0001      Shoulder Exercises: Sidelying   External Rotation Limitations 2 x 15; 1lb LUE      Shoulder Exercises: Standing   External Rotation 15 reps    Theraband Level (Shoulder External Rotation) Level 2 (Red)    External Rotation Weight (lbs) x2; Left    Internal Rotation 15 reps    Theraband Level (Shoulder Internal Rotation) Level 3 (Green)    Internal Rotation Weight (lbs) x2; left    ABduction 12 reps    Shoulder ABduction Weight (lbs) 1    ABduction Limitations x2; LUE partial range    Other Standing Exercises shoulder scaption 2 x 12 LUE; 1#      Shoulder Exercises: ROM/Strengthening   UBE (Upper Arm Bike) level 2; 2  min each fwd/bwd      Manual Therapy   Passive ROM PROM all planes Lt shoulder to tolerance                    PT Short Term Goals - 10/08/20 1530      PT SHORT TERM GOAL #1   Title Pt will be Ind in an initial HEP.    Status Achieved    Target Date 09/22/20      PT SHORT TERM GOAL #2   Title Pt will voice understanding of measures to reduce and manage her L shoulder pain. Achieved-Pt is managing per cold packs and rest    Status Achieved    Target Date 09/22/20             PT Long Term Goals - 12/10/20 1519      PT LONG TERM GOAL #1   Title Improve L shoulder strength to 4+ to 5/5 for functional use with ADLs activites to shoulder height levels    Baseline 3-/5 Lt shoulder    Time 6    Period Weeks    Status On-going      PT LONG TERM GOAL #2   Title Pt will demonstrate L shoulder AROMs to within 90% of the R shoulder for improved functiona use of the L UE    Baseline ER 30,IR 58, Flexion155, Abduction 115    Time 6    Period Weeks    Status On-going      PT LONG TERM GOAL #3   Title Pt will report a reduction of L shoulder pain to 4/10 or less with daily activities    Baseline 6/10 current    Time 6    Period Weeks    Status On-going                 Plan - 01/05/21 1505    Clinical Impression Statement Patient reports no real change in shoulder pain/function since her last session 3 weeks ago. Though she reports high pain levels at beginning of session she is in no obvious distress throughout session. Shoulder flexion and internal rotation AROM remain unchanged since last assessment and abduction and external rotation AROM has regressed compared to previous measurements, demonstrating a capsular pattern of shoulder motion restriction at this time. Overall good tolerance to ROM and strengthening exercises without increased pain, though quickly fatigues with targeted rotator cuff strengthening.    PT Treatment/Interventions ADLs/Self Care Home  Management;Electrical Stimulation;Iontophoresis 4mg /ml Dexamethasone;Moist Heat;Traction;Ultrasound;Therapeutic exercise;Therapeutic activities;Patient/family education;Manual techniques;Dry needling;Passive range of motion;Taping;Vasopneumatic Device;Joint Manipulations    PT Next Visit Plan Re-eval. progress Lt shoulder ROM/strength as tolerated    PT  Home Exercise Plan [phone]           Patient will benefit from skilled therapeutic intervention in order to improve the following deficits and impairments:  Decreased strength,Increased edema,Pain,Obesity,Impaired UE functional use,Decreased range of motion  Visit Diagnosis: Acute pain of left shoulder  S/P left rotator cuff repair  Decreased ROM of left shoulder  Muscle weakness (generalized)     Problem List Patient Active Problem List   Diagnosis Date Noted  . Encounter for screening for cervical cancer 07/15/2020  . Malodorous urine 07/15/2020  . Chronic GERD 03/19/2020  . Syncope 03/14/2020  . Dyspnea on exertion 03/11/2020  . Dizziness 03/11/2020  . Adhesive capsulitis of right shoulder 01/02/2020  . High risk social situation 03/26/2019  . Cramp in muscle 12/11/2018  . TSH deficiency 10/12/2018  . Encounter for screening mammogram for malignant neoplasm of breast 10/09/2018  . Unintentional weight loss 10/09/2018  . Prediabetes 10/08/2018  . Possible exposure to STD 06/13/2018  . Weight loss 06/13/2018  . Lower respiratory infection 12/21/2017  . Foot pain, bilateral 10/27/2017  . Routine screening for STI (sexually transmitted infection) 10/27/2017  . Gait instability 10/27/2017  . Lesion of stomach 09/07/2017  . Nonspecific abnormal findings on imaging of lung 08/21/2017  . Mood disorder (HCC)   . Chronic pain syndrome   . Anxiety state 03/07/2017  . Primary localized osteoarthritis of left hip 02/07/2017  . Fibromyalgia 05/15/2014  . Colonoscopy refused 05/15/2014  . Healthcare maintenance 05/15/2014  .  Schizoaffective disorder (HCC) 12/13/2012  . Other and unspecified hyperlipidemia 12/13/2012  . Plantar fasciitis 04/16/2012  . Primary osteoarthritis of right hip 08/15/2011  . Obesity, morbid (HCC)   . Bipolar 2 disorder (HCC) 06/15/2011  . TOBACCO DEPENDENCE 11/30/2006  . OSTEOARTHRITIS OF SPINE, NOS 11/30/2006  . INCONTINENCE, URGE 11/30/2006   [name] Libra, PT, DPT, ATC 01/05/21 3:48 PM [facility] [address] [place], [place], [address] Phone: [phone]   Fax:  [phone]  Name: MILYN STAPLETON MRN: [id number] Date of Birth: Oct 27, 1960

## 2021-01-07 ENCOUNTER — Ambulatory Visit: Payer: Medicaid Other

## 2021-01-12 ENCOUNTER — Ambulatory Visit (INDEPENDENT_AMBULATORY_CARE_PROVIDER_SITE_OTHER): Payer: Medicaid Other | Admitting: Psychiatry

## 2021-01-12 ENCOUNTER — Other Ambulatory Visit: Payer: Self-pay

## 2021-01-12 DIAGNOSIS — F4310 Post-traumatic stress disorder, unspecified: Secondary | ICD-10-CM

## 2021-01-12 DIAGNOSIS — F331 Major depressive disorder, recurrent, moderate: Secondary | ICD-10-CM | POA: Diagnosis not present

## 2021-01-12 NOTE — Progress Notes (Signed)
Virtual Visit via Video Note  I connected with Bridget [name] on 67/67/20 at  2:30 PM EDT by a video enabled telemedicine application and verified that I am speaking with the correct person using two identifiers.  Location: Patient:HOME Provider:OFFICE  History of Present Illness: MDD and PTSD  Treatment Plan Goals: 1) Chisa would like to process grief and loss experiences to decrease depressive symptoms. 2) Alley would like to process and heal from trauma experiences to decrease instances of trauma triggers/reactions with impact her daily functioning and fulfillment in relationships.  Observations/Objective: Counselor met with Client for individual therapyvia webex. Counselor assessed MH symptoms and progress on treatment plan goals, with patient reportingthat shr is making progress in communicating needs within her support system, however receiving resistance and defiance at times is distressing.Client presents withmoderatedepression and moderateanxiety. Client denied suicidal ideation or self-harm behaviors.  Goal 1)Counselor assessed grief and loss reminders/triggers and their impact on depression since our last session, using CBT interventions.Client noted that he depression has been manageable over the past week, taking medications as prescribed 7 out of 7 days. Counselor processed distress Client is feeling over a familial situation that parallels her own patterns and behaviors in patterning. Counselor used CBT interventions to depersonalize and become solution focused on the things in her realm of control. Client able to engage in discussion and felt confident in application of skills.    Goal2)Counselor processed and assessed trauma triggers and reminders associated with her relationship with her former husband.Client noted that she has not been distressed or triggered when interacting with former husband over the past week. Client reports being reflective and  nostalgic about fond times in the past with him and her children. Counselor processed thoughts and feelings associated with reflections. Counselor and Client used CBT interventions to label thoughts.   Assessment and Plan: Counselor will continue to meet with patient to address treatment plan goals. Patient will continue to follow recommendations of providers and implement skills learned in session.  Follow Up Instructions: Counselor will send information for next session viae-mail.   The patient was advised to call back or seek an in-person evaluation if the symptoms worsen or if the condition fails to improve as anticipated.  I provided3mnutes of face-to-face time during this encounter.   BLise Auer LCSW

## 2021-01-18 ENCOUNTER — Encounter (HOSPITAL_COMMUNITY): Payer: Self-pay | Admitting: Psychiatry

## 2021-01-26 ENCOUNTER — Ambulatory Visit (INDEPENDENT_AMBULATORY_CARE_PROVIDER_SITE_OTHER): Payer: Medicaid Other | Admitting: Psychiatry

## 2021-01-26 ENCOUNTER — Encounter (HOSPITAL_COMMUNITY): Payer: Self-pay | Admitting: Psychiatry

## 2021-01-26 DIAGNOSIS — F331 Major depressive disorder, recurrent, moderate: Secondary | ICD-10-CM | POA: Diagnosis not present

## 2021-01-26 DIAGNOSIS — F4310 Post-traumatic stress disorder, unspecified: Secondary | ICD-10-CM | POA: Diagnosis not present

## 2021-01-26 NOTE — Progress Notes (Signed)
Virtual Visit via Video Note  I connected with Bridget [name] on 82/42/35 at  2:30 PM EDT by a video enabled telemedicine application and verified that I am speaking with the correct person using two identifiers.  Location: Patient:HOME Provider:OFFICE  History of Present Illness: MDD and PTSD  Treatment Plan Goals: 1) Kinisha would like to process grief and loss experiences to decrease depressive symptoms. 2) Tashera would like to process and heal from trauma experiences to decrease instances of trauma triggers/reactions with impact her daily functioning and fulfillment in relationships.  Observations/Objective: Counselor met with Client for individual therapyvia webex. Counselor assessed MH symptoms and progress on treatment plan goals, with patient reportingthat she is doing well overall today emotionally, mentally and physically.Client presents withmilddepression and moderateanxiety. Client denied suicidal ideation or self-harm behaviors.  Goal 1)Counselor assessed grief and loss reminders/triggers and their impact on depression since our last session, using CBT interventions.Client noted that he depression has been manageable over the past week, taking medications as prescribed 7 out of 7 days. Client noted that she had not experienced any notable grief responses or triggers this week.    Goal2)Counselor processed and assessed trauma triggers and reminders associated with her relationship with her former husband.Client shared progress towards restoration in her relationship with her former husband. Client shared mental and emotional steps she has taken in repairing relationship, in addition to his actions and interactions which have made the healing process more accessible for her. Counselor processed thoughts and emotions using CBT interventions and reflected on overall progress in this area. Counselor reviewed boundary setting to maintain respectful progress in  relationship.   Assessment and Plan: Counselor will continue to meet with patient to address treatment plan goals. Patient will continue to follow recommendations of providers and implement skills learned in session.  Follow Up Instructions: Counselor will send information for next session viae-mail.   The patient was advised to call back or seek an in-person evaluation if the symptoms worsen or if the condition fails to improve as anticipated.  I provided66mnutes of face-to-face time during this encounter.   BLise Auer LCSW

## 2021-01-27 ENCOUNTER — Ambulatory Visit: Payer: Medicaid Other

## 2021-01-28 ENCOUNTER — Ambulatory Visit: Payer: Medicaid Other

## 2021-01-28 ENCOUNTER — Other Ambulatory Visit: Payer: Self-pay

## 2021-01-28 DIAGNOSIS — M25612 Stiffness of left shoulder, not elsewhere classified: Secondary | ICD-10-CM

## 2021-01-28 DIAGNOSIS — M25512 Pain in left shoulder: Secondary | ICD-10-CM

## 2021-01-28 DIAGNOSIS — Z9889 Other specified postprocedural states: Secondary | ICD-10-CM

## 2021-01-28 DIAGNOSIS — M6281 Muscle weakness (generalized): Secondary | ICD-10-CM

## 2021-01-29 NOTE — Therapy (Signed)
[place], [place], [address] Phone: [phone]   Fax:  [phone]  Physical Therapy Treatment/Re-Cert/Discharge  Patient Details  Name: Bridget [name] MRN: [id number] Date of Birth: 1960-12-04 Referring Provider (PT): Marchia Bond, MD   Encounter Date: 01/28/2021   PT End of Session - 01/29/21 0651    Visit Number 15    Number of Visits 20    Date for PT Re-Evaluation 01/09/21    Authorization Type MEDICAID [place] ACCESS    Authorization - Visit Number 2    Authorization - Number of Visits 4    PT Start Time 1749    PT Stop Time 1532    PT Time Calculation (min) 41 min    Activity Tolerance Patient tolerated treatment well    Behavior During Therapy [facility] for tasks assessed/performed           Past Medical History:  Diagnosis Date  . Bipolar II disorder ([place])   . Community acquired pneumonia of right upper lobe of lung 08/14/2017  . Depression   . GERD (gastroesophageal reflux disease)   . Near syncope   . Obesity, morbid ([place])   . Osteoarthritis     Past Surgical History:  Procedure Laterality Date  . BICEPT TENODESIS Left 06/30/2020   Procedure: BICEPS TENODESIS;  Surgeon: Marchia Bond, MD;  Location: [place];  Service: Orthopedics;  Laterality: Left;  . BONE SPURS     REMOVED FROM SHOULDERS  . EYE SURGERY     STY REMOVED LEFT EYE  1992  . ROTATOR CUFF REPAIR     BILATERAL  . SHOULDER ARTHROSCOPY WITH DISTAL CLAVICLE RESECTION Left 06/30/2020   Procedure: SHOULDER ARTHROSCOPY WITH DISTAL CLAVICLE RESECTION;  Surgeon: Marchia Bond, MD;  Location: [place];  Service: Orthopedics;  Laterality: Left;  . SHOULDER ARTHROSCOPY WITH ROTATOR CUFF REPAIR AND SUBACROMIAL DECOMPRESSION Left 06/30/2020   Procedure: LEFT SHOULDER ARTHROSCOPY DEBRIDEMENT, ACROMIOPLASTY, DISTAL CLAVICLE EXCISION,ROTATOR CUFF REPAIR, BICEPS TENODESIS;  Surgeon: Marchia Bond,  MD;  Location: [place];  Service: Orthopedics;  Laterality: Left;  . TOTAL HIP ARTHROPLASTY  08/15/2011   Procedure: TOTAL HIP ARTHROPLASTY;  Surgeon: Johnny Bridge;  Location: [place];  Service: Orthopedics;  Laterality: Right;  . TOTAL HIP ARTHROPLASTY Left 02/07/2017  . TOTAL HIP ARTHROPLASTY Left 02/07/2017   Procedure: LEFT TOTAL HIP ARTHROPLASTY;  Surgeon: Marchia Bond, MD;  Location: [place];  Service: Orthopedics;  Laterality: Left;    There were no vitals filed for this visit.   Subjective Assessment - 01/28/21 1457    Subjective Pt reports seeing Dr. Luanna Cole PA today and she is to DC from PT and continue with her HEP.    Patient Stated Goals To reach up above head  to take care of items in home    Currently in Pain? Yes    Pain Score 5    with pain medication   Pain Location Shoulder    Pain Orientation Left    Pain Descriptors / Indicators Aching   stiffness   Pain Type Chronic pain    Pain Onset More than a month ago    Pain Frequency Constant                             [facility] Adult PT Treatment/Exercise - 01/29/21 0001      Shoulder Exercises: Supine   Horizontal ABduction Both;15 reps  Theraband Level (Shoulder Horizontal ABduction) Level 2 (Red)      Shoulder Exercises: Standing   External Rotation 15 reps    Theraband Level (Shoulder External Rotation) Level 2 (Red)    External Rotation Weight (lbs) Left    Internal Rotation 15 reps    Theraband Level (Shoulder Internal Rotation) Level 2 (Red)    Internal Rotation Weight (lbs) left    Flexion Left;15 reps   with wall assist   Row Both;15 reps    Theraband Level (Shoulder Row) Level 3 (Green)      Shoulder Exercises: Stretch   Internal Rotation Stretch 2 reps    Internal Rotation Stretch Limitations 30 sec; towel    External Rotation Stretch 2 reps;30 seconds   door frame   Other Shoulder Stretches Upper trap; L and R;  2x; 20 sec    Other Shoulder Stretches Levator; L  and R; 2x; 20 sec                  PT Education - 01/29/21 [phone]    Education Details Final HEP was reviewed and completed    Person(s) Educated Patient    Methods Explanation    Comprehension Verbalized understanding;Returned demonstration            PT Short Term Goals - 10/08/20 1530      PT SHORT TERM GOAL #1   Title Pt will be Ind in an initial HEP.    Status Achieved    Target Date 09/22/20      PT SHORT TERM GOAL #2   Title Pt will voice understanding of measures to reduce and manage her L shoulder pain. Achieved-Pt is managing per cold packs and rest    Status Achieved    Target Date 09/22/20             PT Long Term Goals - 01/29/21 0653      PT LONG TERM GOAL #1   Title Improve L shoulder strength to 4+ to 5/5 for functional use with ADLs activites to shoulder height levels. 01/28/21: 4/5    Baseline 3-/5 Lt shoulder    Status Not Met    Target Date 01/28/21      PT LONG TERM GOAL #2   Title Pt will demonstrate L shoulder AROMs to within 90% of the R shoulder for improved functiona use of the L UE. 01/28/21: Met for flexion and IR, not met for abd and ER    Baseline ER 30,IR 58, Flexion155, Abduction 115    Status Partially Met    Target Date 01/28/21      PT LONG TERM GOAL #3   Title Pt will report a reduction of L shoulder pain to 4/10 or less with daily activities. 01/28/21: Not met c pain levels 5-8/10    Baseline 6/10 current    Status Not Met                 Plan - 01/29/21 4540    Clinical Impression Statement Pt returns to PT following meeting c Dr. Luanna Cole PA. Pt reports she is to DC PT and continue with her rehab at home c her HEP. She reports it may take 1 to 1.5 yrs for her L shoulder to heal. Today, a HEP was reviewed and completed for pt to continue her rehab at home. Pt returned demonstration of the exs. Overall, pt's AROM, strength and function of the L shoulder/UE has improved with set goals being partially met.  Pt is in  ageement with DC at this time.    Personal Factors and Comorbidities Comorbidity 3+;Time since onset of injury/illness/exacerbation;Fitness;Profession    Comorbidities OA, obesity, depression, bipolar ll    Examination-Activity Limitations Bathing;Carry;Dressing;Lift;Reach Overhead    Examination-Participation Restrictions Cleaning;Driving    Stability/Clinical Decision Making Stable/Uncomplicated    Clinical Decision Making Low    Rehab Potential Good    PT Frequency 2x / week    PT Treatment/Interventions ADLs/Self Care Home Management;Electrical Stimulation;Iontophoresis 66m/ml Dexamethasone;Moist Heat;Traction;Ultrasound;Therapeutic exercise;Therapeutic activities;Patient/family education;Manual techniques;Dry needling;Passive range of motion;Taping;Vasopneumatic Device;Joint Manipulations    PT Home Exercise Plan [id number]    Consulted and Agree with Plan of Care Patient           Patient will benefit from skilled therapeutic intervention in order to improve the following deficits and impairments:  Decreased strength,Increased edema,Pain,Obesity,Impaired UE functional use,Decreased range of motion  Visit Diagnosis: Acute pain of left shoulder  S/P left rotator cuff repair  Decreased ROM of left shoulder  Muscle weakness (generalized)     Problem List Patient Active Problem List   Diagnosis Date Noted  . Encounter for screening for cervical cancer 07/15/2020  . Malodorous urine 07/15/2020  . Chronic GERD 03/19/2020  . Syncope 03/14/2020  . Dyspnea on exertion 03/11/2020  . Dizziness 03/11/2020  . Adhesive capsulitis of right shoulder 01/02/2020  . High risk social situation 03/26/2019  . Cramp in muscle 12/11/2018  . TSH deficiency 10/12/2018  . Encounter for screening mammogram for malignant neoplasm of breast 10/09/2018  . Unintentional weight loss 10/09/2018  . Prediabetes 10/08/2018  . Possible exposure to STD 06/13/2018  . Weight loss 06/13/2018  . Lower  respiratory infection 12/21/2017  . Foot pain, bilateral 10/27/2017  . Routine screening for STI (sexually transmitted infection) 10/27/2017  . Gait instability 10/27/2017  . Lesion of stomach 09/07/2017  . Nonspecific abnormal findings on imaging of lung 08/21/2017  . Mood disorder ([place]   . Chronic pain syndrome   . Anxiety state 03/07/2017  . Primary localized osteoarthritis of left hip 02/07/2017  . Fibromyalgia 05/15/2014  . Colonoscopy refused 05/15/2014  . Healthcare maintenance 05/15/2014  . Schizoaffective disorder ([employer] 12/13/2012  . Other and unspecified hyperlipidemia 12/13/2012  . Plantar fasciitis 04/16/2012  . Primary osteoarthritis of right hip 08/15/2011  . Obesity, morbid ([place]   . B[place] 2 disorder ([place] 06/15/2011  . TOBACCO DEPENDENCE 11/30/2006  . OSTEOARTHRITIS OF SPINE, NOS 11/30/2006  . INCONTINENCE, URGE 11/30/2006   PHYSICAL THERAPY DISCHARGE SUMMARY  Visits from Start of Care: 15  Current functional level related to goals / functional outcomes: See above   Remaining deficits: See above   Education / Equipment: HEP  Plan: Patient agrees to discharge.  Patient goals were partially met. Patient is being discharged due to the physician's request.  ?????          AGar PontoMS, PT 01/29/21 7:20 AM  [facility] [address]Phone: [phone]  Fax:  [phone] Name: Bridget RIERAMRN: [id number] of Birth: 1Sep 22, 1962

## 2021-02-09 ENCOUNTER — Encounter (HOSPITAL_COMMUNITY): Payer: Self-pay | Admitting: Psychiatry

## 2021-02-09 ENCOUNTER — Ambulatory Visit (INDEPENDENT_AMBULATORY_CARE_PROVIDER_SITE_OTHER): Payer: Medicaid Other | Admitting: Psychiatry

## 2021-02-09 ENCOUNTER — Other Ambulatory Visit: Payer: Self-pay

## 2021-02-09 DIAGNOSIS — F331 Major depressive disorder, recurrent, moderate: Secondary | ICD-10-CM | POA: Diagnosis not present

## 2021-02-09 DIAGNOSIS — F4310 Post-traumatic stress disorder, unspecified: Secondary | ICD-10-CM | POA: Diagnosis not present

## 2021-02-09 NOTE — Progress Notes (Signed)
Virtual Visit via Video Note  I connected with Bridget [name] on 15/94/58 at  2:30 PM EDT by a video enabled telemedicine application and verified that I am speaking with the correct person using two identifiers.  Location: Patient:HOME Provider:OFFICE  History of Present Illness: MDD and PTSD  Treatment Plan Goals: 1) [name] would like to process grief and loss experiences to decrease depressive symptoms. 2) [name] would like to process and heal from trauma experiences to decrease instances of trauma triggers/reactions with impact her daily functioning and fulfillment in relationships.  Observations/Objective: Counselor met with Client for individual therapyvia webex. Counselor assessed MH symptoms and progress on treatment plan goals, with patient reportingthat she is experiencing increased physical pain due to the weather, making it difficult to engage in typical daily activities.Client presents withmilddepression and moderateanxiety. Client denied suicidal ideation or self-harm behaviors.  Goal 1)Counselor assessed grief and loss reminders/triggers and their impact on depression since our last session, using CBT interventions.Client noted that he depression has been manageable over the past week, taking medications as prescribed 7 out of 7 days. Client noted a couple triggers related to change in activity and lifestyle from being married to single, and in regards to aging process. Counsleorprovided psychoeducation on grief and loss related to the stages of development and family systems, with Client identifying how information relates to her grief responses.   Goal2)Counselor processed and assessed trauma triggers and reminders associated with her relationship with her former husband.Client discussed updates and conversations had with former husband and their children. Counselor processed her reactions and responses using CBT model. Client able to identify healthier  responses and how family history connects to current behaviors of family members. Client states no current safety issues at this time.   Assessment and Plan: Counselor will continue to meet with patient to address treatment plan goals. Patient will continue to follow recommendations of providers and implement skills learned in session.  Follow Up Instructions: Counselor will send information for next session viae-mail.   The patient was advised to call back or seek an in-person evaluation if the symptoms worsen or if the condition fails to improve as anticipated.  I provided3mnutes of face-to-face time during this encounter.   BLise Auer LCSW

## 2021-02-16 ENCOUNTER — Ambulatory Visit (INDEPENDENT_AMBULATORY_CARE_PROVIDER_SITE_OTHER): Payer: Medicaid Other | Admitting: Psychiatry

## 2021-02-16 ENCOUNTER — Other Ambulatory Visit: Payer: Self-pay

## 2021-02-16 DIAGNOSIS — F331 Major depressive disorder, recurrent, moderate: Secondary | ICD-10-CM | POA: Diagnosis not present

## 2021-02-16 DIAGNOSIS — F4310 Post-traumatic stress disorder, unspecified: Secondary | ICD-10-CM | POA: Diagnosis not present

## 2021-02-19 NOTE — Progress Notes (Signed)
Virtual Visit via Video Note  I connected with Bridget [name] on 58/30/94 at  2:30 PM EDT by a video enabled telemedicine application and verified that I am speaking with the correct person using two identifiers.  Location: Patient:HOME Provider:OFFICE  History of Present Illness: MDD and PTSD  Treatment Plan Goals: 1) Bridget [name] would like to process grief and loss experiences to decrease depressive symptoms. 2) Bridget [name] would like to process and heal from trauma experiences to decrease instances of trauma triggers/reactions with impact her daily functioning and fulfillment in relationships.  Observations/Objective: Counselor met with Client for individual therapyvia webex. Counselor assessed MH symptoms and progress on treatment plan goals, with patient reportingthatshe doing ok overall regarding mental health, dealing with emotional distress off and on with family stressors and physical health.Client presents withmilddepression and moderateanxiety. Client denied suicidal ideation or self-harm behaviors.  Goal 1)Counselor assessed grief and loss reminders/triggers and their impact on depression since our last session, using CBT interventions.Client noted that she is taking medications as prescribed 7 out of 7 days. Client declined any grief loss processing at this time, noting no significant triggers.   Goal2)Counselor processed and assessed trauma triggers and reminders associated with her relationship with her former husband.Client discussed updates and conversations had with former husband and their children. Counselor processed her reactions and responses using CBT model. Client identified a few instances where family conflicts "stirred up" trauma responses. However, after processing and assessment, Client reports successful management of emotions and impact on overall functioning. Client making progress in how she expressed needs and boundaries with family members.    Assessment and Plan: Counselor will continue to meet with patient to address treatment plan goals. Patient will continue to follow recommendations of providers and implement skills learned in session.  Follow Up Instructions: Counselor will send information for next session viae-mail.   The patient was advised to call back or seek an in-person evaluation if the symptoms worsen or if the condition fails to improve as anticipated.  I provided60mnutes of face-to-face time during this encounter.   BLise Auer LCSW

## 2021-02-22 ENCOUNTER — Encounter (HOSPITAL_COMMUNITY): Payer: Self-pay | Admitting: Psychiatry

## 2021-02-23 ENCOUNTER — Encounter (HOSPITAL_COMMUNITY): Payer: Self-pay | Admitting: Psychiatry

## 2021-02-23 ENCOUNTER — Other Ambulatory Visit: Payer: Self-pay

## 2021-02-23 ENCOUNTER — Ambulatory Visit (INDEPENDENT_AMBULATORY_CARE_PROVIDER_SITE_OTHER): Payer: Medicaid Other | Admitting: Psychiatry

## 2021-02-23 DIAGNOSIS — F331 Major depressive disorder, recurrent, moderate: Secondary | ICD-10-CM | POA: Diagnosis not present

## 2021-02-23 DIAGNOSIS — F4310 Post-traumatic stress disorder, unspecified: Secondary | ICD-10-CM | POA: Diagnosis not present

## 2021-02-23 NOTE — Progress Notes (Signed)
Virtual Visit via Video Note  I connected with [name] on 55/97/41 at  2:30 PM EDT by a video enabled telemedicine application and verified that I am speaking with the correct person using two identifiers.  Location: Patient:HOME Provider:OFFICE  History of Present Illness: MDD and PTSD  Treatment Plan Goals: 1) Crucita would like to process grief and loss experiences to decrease depressive symptoms. 2) Grizelda would like to process and heal from trauma experiences to decrease instances of trauma triggers/reactions with impact her daily functioning and fulfillment in relationships.  Observations/Objective: Counselor met with Client for individual therapyvia webex. Counselor assessed MH symptoms and progress on treatment plan goals, with patient reportingthat she is having challenges with mobility which causes a depressed and anxious mood to increase. Client attending appointments and following recommendations of providers. Client states difficulty in obtaining a good nights rest due to pain and need to reposition.Client presents withmilddepression and moderateanxiety. Client denied suicidal ideation or self-harm behaviors.  Goal 1)Counselor assessed grief and loss reminders/triggers and their impact on depression since our last session, using CBT interventions.Client noted that her depression has increased over the past week, taking medications as prescribed 7 out of 7 days. Client states that she feels hopeless at times that she will regain functioning of body as she desires due to pending procedures, surgeries and recovery from past treatments. Counselor and Client engaged in CBT processing to identify locus of control and hope for her future with reality testing. Client responded well to intervention, engaging in discussion, with a solution minded approach.     Goal2)Counselor processed and assessed trauma triggers and reminders associated with her relationship with  her former husband.Client did not express any needs or progress in this area during this session, due to working on Goal 1 for majority of session.    Assessment and Plan: Counselor will continue to meet with patient to address treatment plan goals. Patient will continue to follow recommendations of providers and implement skills learned in session.  Follow Up Instructions: Counselor will send information for next session viae-mail.   The patient was advised to call back or seek an in-person evaluation if the symptoms worsen or if the condition fails to improve as anticipated.  I provided76mnutes of face-to-face time during this encounter.   BLise Auer LCSW

## 2021-03-02 ENCOUNTER — Ambulatory Visit (INDEPENDENT_AMBULATORY_CARE_PROVIDER_SITE_OTHER): Payer: Medicaid Other | Admitting: Psychiatry

## 2021-03-02 ENCOUNTER — Other Ambulatory Visit: Payer: Self-pay

## 2021-03-02 DIAGNOSIS — F411 Generalized anxiety disorder: Secondary | ICD-10-CM

## 2021-03-02 NOTE — Progress Notes (Signed)
Virtual Visit via Video Note  I connected with Bridget [name] on 13/14/38 at  2:30 PM EDT by a video enabled telemedicine application and verified that I am speaking with the correct person using two identifiers.  Location: Patient:HOME Provider:OFFICE  History of Present Illness: MDD and PTSD  Treatment Plan Goals: 1) Bridget [name] would like to process grief and loss experiences to decrease depressive symptoms. 2) Bridget [name] would like to process and heal from trauma experiences to decrease instances of trauma triggers/reactions with impact her daily functioning and fulfillment in relationships.  Observations/Objective: Counselor met with Bridget [name] for individual therapyvia webex. Counselor assessed MH symptoms and progress on treatment plan goals, with patient reportingthatshe had difficulty sleeping overnight due to trauma triggers and responses. Bridget [name] reports hypervigilance, irritability, jumpiness, flashbacks and urge to withdraw.Bridget [name] presents withmilddepression and moderateanxiety. Bridget [name] denied suicidal ideation or self-harm behaviors.  Goal 1)Counselor assessed grief and loss reminders/triggers and their impact on depression since our last session, using CBT interventions.Bridget [name] noted that her depression has increased over the past week, taking medications as prescribed 7 out of 7 days.Bridget [name] shared about her experience staying at her former home with former partner over night to assist him pre-post surgery. Bridget [name] discussed body sensations and intrusive thoughts while in the home. Counselor and Bridget [name] focused on safety aspects and locus of control. Bridget [name] feels safe overall and able to assert needs and boundaries.    Goal2)Counselor processed and assessed trauma triggers and reminders associated with her relationship with her former husband.Bridget [name] associated trauma concerns above with grief and loss issues of transition in their relationship. Bridget [name] shared about memories  from their past that were challenging at the time. Counselor highlighted Bridget [name]'s resiliency and progress over the past 2 years.   Assessment and Plan: Counselor will continue to meet with patient to address treatment plan goals. Patient will continue to follow recommendations of providers and implement skills learned in session.  Follow Up Instructions: Counselor will send information for next session viae-mail.   The patient was advised to call back or seek an in-person evaluation if the symptoms worsen or if the condition fails to improve as anticipated.  I provided86mnutes of face-to-face time during this encounter.   BLise Auer LCSW

## 2021-03-05 ENCOUNTER — Encounter (HOSPITAL_COMMUNITY): Payer: Self-pay | Admitting: Psychiatry

## 2021-03-09 ENCOUNTER — Other Ambulatory Visit: Payer: Self-pay

## 2021-03-09 ENCOUNTER — Encounter (HOSPITAL_COMMUNITY): Payer: Self-pay | Admitting: Psychiatry

## 2021-03-09 ENCOUNTER — Ambulatory Visit (INDEPENDENT_AMBULATORY_CARE_PROVIDER_SITE_OTHER): Payer: Medicaid Other | Admitting: Psychiatry

## 2021-03-09 DIAGNOSIS — F4310 Post-traumatic stress disorder, unspecified: Secondary | ICD-10-CM | POA: Diagnosis not present

## 2021-03-09 DIAGNOSIS — F411 Generalized anxiety disorder: Secondary | ICD-10-CM

## 2021-03-09 NOTE — Progress Notes (Signed)
Virtual Visit via Telephone Note  I connected with Bridget [name] on 14/84/03 at  3:30 PM EDT by telephone and verified that I am speaking with the correct person using two identifiers.  Location: Patient: HOME Provider: OFFICE   History of Present Illness: MDD and PTSD   Treatment Plan Goals: 1) Bridget [name] would like to process grief and loss experiences to decrease depressive symptoms. 2) Bridget [name] would like to process and heal from trauma experiences to decrease instances of trauma triggers/reactions with impact her daily functioning and fulfillment in relationships.    Observations/Objective: Counselor met with Client for individual therapy via webex. Counselor assessed MH symptoms and progress on treatment plan goals, with patient reporting that she had difficulty sleeping overnight due to trauma triggers and responses. Client reports hypervigilance, irritability, jumpiness, flashbacks and urge to withdraw. Client presents with mild depression and moderate anxiety. Client denied suicidal ideation or self-harm behaviors.    Goal 1) Counselor assessed grief and loss reminders/triggers and their impact on depression since our last session, using CBT interventions. Client noted that her depression has increased over the past week, taking medications as prescribed 7 out of 7 days. Client shared updates on skills application of communication skills and boundary setting while spending time with former partner. Client was proud of actions and outcome. Counselor praised Client for assertiveness, highlighting progress over time together.      Goal 2) Counselor processed and assessed trauma triggers and reminders associated with her relationship with her former husband. Client reported no new triggers this week as she has been processing emotions about other relationship dynamics within her family. No needs as this time.    Assessment and Plan: Counselor will continue to meet with patient to address  treatment plan goals. Patient will continue to follow recommendations of providers and implement skills learned in session.   Follow Up Instructions: Counselor will send information for next session via e-mail.    The patient was advised to call back or seek an in-person evaluation if the symptoms worsen or if the condition fails to improve as anticipated.   I provided 55 minutes of face-to-face time during this encounter.     [name], LCSW

## 2021-03-16 ENCOUNTER — Encounter (HOSPITAL_COMMUNITY): Payer: Self-pay | Admitting: Psychiatry

## 2021-03-16 ENCOUNTER — Other Ambulatory Visit: Payer: Self-pay

## 2021-03-16 ENCOUNTER — Ambulatory Visit (INDEPENDENT_AMBULATORY_CARE_PROVIDER_SITE_OTHER): Payer: Medicaid Other | Admitting: Psychiatry

## 2021-03-16 DIAGNOSIS — F411 Generalized anxiety disorder: Secondary | ICD-10-CM | POA: Diagnosis not present

## 2021-03-16 DIAGNOSIS — F4310 Post-traumatic stress disorder, unspecified: Secondary | ICD-10-CM

## 2021-03-16 NOTE — Progress Notes (Signed)
Virtual Visit via Video Note  I connected with [name] on 88/82/80 at  2:30 PM EDT by a video enabled telemedicine application and verified that I am speaking with the correct person using two identifiers.  Location: Patient: HOME Provider: OFFICE   History of Present Illness: MDD and PTSD   Treatment Plan Goals: 1) Natale would like to process grief and loss experiences to decrease depressive symptoms.  2) Denitra would like to process and heal from trauma experiences to decrease instances of trauma triggers/reactions with impact her daily functioning and fulfillment in relationships.  3) Beatric will set and reenforce healthy boundaries with former partner and adult children in 4 out of 5 interactions, reporting effectiveness in session.   Observations/Objective: Counselor met with Client for individual therapy via webex. Counselor assessed MH symptoms and progress on treatment plan goals, with patient reporting that she is dealing with moderate pain and lack of sleep, following up with providers. Mental health stable at this time. Client presents with mild depression and mild anxiety. Client denied suicidal ideation or self-harm behaviors.    Counselor and Client spent time discussing updates, additions and revisions to CCA and treatment plan goals. Client reflects on amount of progress made over the past year, expressing pride and appreciation in the work she has put into process. Counselor highlighted and and praised accomplishments and milestones in her care. Counselor to continue work with Client on grief and loss, trauma triggers and boundary setting. Client committed to treatment process and discussed benefits of lifestyle changes. No medication needs at this time, med compliant.    Assessment and Plan: Counselor will continue to meet with patient to address treatment plan goals. Patient will continue to follow recommendations of providers and implement skills learned in session.    Follow Up Instructions: Counselor will send information for next session via e-mail.   The patient was advised to call back or seek an in-person evaluation if the symptoms worsen or if the condition fails to improve as anticipated.   I provided 55 minutes of face-to-face time during this encounter.     [name], LCSW

## 2021-03-23 ENCOUNTER — Ambulatory Visit (INDEPENDENT_AMBULATORY_CARE_PROVIDER_SITE_OTHER): Payer: Medicaid Other | Admitting: Psychiatry

## 2021-03-23 ENCOUNTER — Other Ambulatory Visit: Payer: Self-pay

## 2021-03-23 DIAGNOSIS — F411 Generalized anxiety disorder: Secondary | ICD-10-CM

## 2021-03-23 DIAGNOSIS — F4310 Post-traumatic stress disorder, unspecified: Secondary | ICD-10-CM

## 2021-03-23 NOTE — Progress Notes (Signed)
Virtual Visit via Video Note  I connected with Bridget [name] on 17/51/02 at  2:30 PM EDT by a video enabled telemedicine application and verified that I am speaking with the correct person using two identifiers.  Location: Patient: HOME Provider: OFFICE   History of Present Illness: MDD and PTSD   Treatment Plan Goals: 1) [name] would like to process grief and loss experiences to decrease depressive symptoms. 2) [name] would like to process and heal from trauma experiences to decrease instances of trauma triggers/reactions with impact her daily functioning and fulfillment in relationships.  3) [name] will set and reenforce healthy boundaries with former partner and adult children in 4 out of 5 interactions, reporting effectiveness in session.   Observations/Objective: Counselor met with Client for individual therapy via webex. Counselor assessed MH symptoms and progress on treatment plan goals, with patient reporting that had challenges with family members over the weekend, causing her to have intrusive thoughts and ruminations causing anger and agitation. Client presents with mild depression and moderate anxiety. Client denied suicidal ideation or self-harm behaviors.    Goals 1-3) Counselor used CBT interventions to process grief and trauma responses/reminders, Client reports experiencing since las session. Client reported on 2 triggering events involving her former husband, her adult children and father's day. Counselor reviewed coping strategies and validated Client experience. Client shared how she established boundaries and boundary setting in approximately half of her interactions with family. Counselor reviewed boundary setting strategies with the Client, prompting to role play how she plans to communicate needs in future interactions. Client engaged and receptive to concepts.    Assessment and Plan: Counselor will continue to meet with patient to address treatment plan goals. Patient  will continue to follow recommendations of providers and implement skills learned in session.   Follow Up Instructions: Counselor will send information for next session via e-mail.   The patient was advised to call back or seek an in-person evaluation if the symptoms worsen or if the condition fails to improve as anticipated.   I provided 40 minutes of face-to-face time during this encounter.     [name], LCSW

## 2021-03-26 ENCOUNTER — Encounter (HOSPITAL_COMMUNITY): Payer: Self-pay | Admitting: Psychiatry

## 2021-03-30 ENCOUNTER — Other Ambulatory Visit: Payer: Self-pay

## 2021-03-30 ENCOUNTER — Ambulatory Visit (HOSPITAL_COMMUNITY): Payer: Medicaid Other | Admitting: Psychiatry

## 2021-03-30 DIAGNOSIS — F4310 Post-traumatic stress disorder, unspecified: Secondary | ICD-10-CM

## 2021-03-31 ENCOUNTER — Encounter (HOSPITAL_COMMUNITY): Payer: Self-pay | Admitting: Psychiatry

## 2021-03-31 NOTE — Progress Notes (Signed)
Counselor and Client attempted to connect via phone over 10 times, however the calls were fuzzy and kept getting disconnected. From what Counselor was able to hear, Client stated she was safe and ok. She had visited Pain Management appointment and was having difficulties with getting prescription filled. Client in process of sharing other vital updates without success due to phone issues. Will meet again next week and offered for her to reach out before then if needed.   Hilbert Odor, LCSW

## 2021-04-07 ENCOUNTER — Encounter (HOSPITAL_COMMUNITY): Payer: Self-pay | Admitting: Psychiatry

## 2021-04-07 ENCOUNTER — Ambulatory Visit (INDEPENDENT_AMBULATORY_CARE_PROVIDER_SITE_OTHER): Payer: Medicaid Other | Admitting: Psychiatry

## 2021-04-07 ENCOUNTER — Other Ambulatory Visit: Payer: Self-pay

## 2021-04-07 DIAGNOSIS — F4310 Post-traumatic stress disorder, unspecified: Secondary | ICD-10-CM

## 2021-04-07 DIAGNOSIS — F411 Generalized anxiety disorder: Secondary | ICD-10-CM | POA: Diagnosis not present

## 2021-04-07 NOTE — Progress Notes (Signed)
Virtual Visit via Video Note  I connected with Bridget [name] on 25/42/70 at  2:30 PM EDT by a video enabled telemedicine application and verified that I am speaking with the correct person using two identifiers.  Location: Patient: HOME Provider: OFFICE   History of Present Illness: MDD and PTSD   Treatment Plan Goals: 1) [name] would like to process grief and loss experiences to decrease depressive symptoms. 2) [name] would like to process and heal from trauma experiences to decrease instances of trauma triggers/reactions with impact her daily functioning and fulfillment in relationships.  3) [name] will set and reenforce healthy boundaries with former partner and adult children in 4 out of 5 interactions, reporting effectiveness in session.   Observations/Objective: Counselor met with Client for individual therapy via webex. Counselor assessed MH symptoms and progress on treatment plan goals, with patient reporting that she is managing pain levels better after advocating for care with provider and that she has been managing triggers over past 2 weeks using coping skills. Client presents with mild depression and moderate anxiety. Client denied suicidal ideation or self-harm behaviors.    Goals 1-3) Counselor used CBT interventions to process grief and trauma responses/reminders, Client reports experiencing since las session. Client reported that she cannot recall specific grief reminders. Client identified trauma responses when being denied care recently with provider. Client identified ability to manage triggers in a healthier manner then in the past. Counselor reviewed coping strategies and validated Client experience. Client shared how she established boundaries and boundary setting in approximately 5 of 5 of her interactions with family. Counselor reviewed boundary setting strategies with the Client, prompting to role play how she plans to communicate needs in future interactions. Client  engaged and receptive to concepts.    Assessment and Plan: Counselor will continue to meet with patient to address treatment plan goals. Patient will continue to follow recommendations of providers and implement skills learned in session.   Follow Up Instructions: Counselor will send information for next session via e-mail.   The patient was advised to call back or seek an in-person evaluation if the symptoms worsen or if the condition fails to improve as anticipated.   I provided 20 minutes of face-to-face time during this encounter.     [name], LCSW

## 2021-04-13 ENCOUNTER — Encounter (HOSPITAL_COMMUNITY): Payer: Self-pay | Admitting: Psychiatry

## 2021-04-13 ENCOUNTER — Other Ambulatory Visit: Payer: Self-pay

## 2021-04-13 ENCOUNTER — Ambulatory Visit (INDEPENDENT_AMBULATORY_CARE_PROVIDER_SITE_OTHER): Payer: Medicaid Other | Admitting: Psychiatry

## 2021-04-13 DIAGNOSIS — F411 Generalized anxiety disorder: Secondary | ICD-10-CM

## 2021-04-13 DIAGNOSIS — F4310 Post-traumatic stress disorder, unspecified: Secondary | ICD-10-CM | POA: Diagnosis not present

## 2021-04-13 NOTE — Progress Notes (Signed)
Virtual Visit via Video Note  I connected with Bridget [name] on 53/79/43 at  2:30 PM EDT by a video enabled telemedicine application and verified that I am speaking with the correct person using two identifiers.  Location: Patient: HOME Provider: OFFICE   History of Present Illness: MDD and PTSD   Treatment Plan Goals: 1) Bridget [name] would like to process grief and loss experiences to decrease depressive symptoms. 2) Bridget [name] would like to process and heal from trauma experiences to decrease instances of trauma triggers/reactions with impact her daily functioning and fulfillment in relationships.  3) Bridget [name] will set and reenforce healthy boundaries with former partner and adult children in 4 out of 5 interactions, reporting effectiveness in session.   Observations/Objective: Counselor met with Client for individual therapy via webex. Counselor assessed MH symptoms and progress on treatment plan goals, with patient reporting that she is doing better emotionally, mentally and physically than she has been in the past few weeks. Pain management is more manageable. Client presents with mild depression and moderate anxiety. Client denied suicidal ideation or self-harm behaviors.    Goals 1-3) Counselor used CBT interventions to process grief and trauma responses/reminders, Client reports experiencing since las session. Client identified coping through comfort foods, cooking family meals, reflecting on past times that were enjoyable with loved ones. Counselor shared grief and loss terminology and tasks as it related to her grief process. Client discussed flashbacks and trauma reminders that have impacted her over the past 2 weeks. Client reports that talking through troubling experiences with Counselor to heal and resolve what she was unable at those times.    Client shared how she established boundaries and boundary setting in approximately 4 of 5 of her interactions with family. Counselor reviewed boundary  setting strategies with the Client, prompting to role play how she plans to communicate needs in future interactions. Counselor focused on safety aspect of boundary setting with former husband, as triggers are more impactful on her mental health. Client engaged and receptive to concepts.    Assessment and Plan: Counselor will continue to meet with patient to address treatment plan goals. Patient will continue to follow recommendations of providers and implement skills learned in session.   Follow Up Instructions: Counselor will send information for next session via e-mail.   The patient was advised to call back or seek an in-person evaluation if the symptoms worsen or if the condition fails to improve as anticipated.   I provided 55 minutes of face-to-face time during this encounter.     [name], LCSW

## 2021-04-20 ENCOUNTER — Ambulatory Visit (INDEPENDENT_AMBULATORY_CARE_PROVIDER_SITE_OTHER): Payer: Medicaid Other | Admitting: Psychiatry

## 2021-04-20 ENCOUNTER — Other Ambulatory Visit: Payer: Self-pay

## 2021-04-20 ENCOUNTER — Encounter (HOSPITAL_COMMUNITY): Payer: Self-pay | Admitting: Psychiatry

## 2021-04-20 DIAGNOSIS — F4310 Post-traumatic stress disorder, unspecified: Secondary | ICD-10-CM

## 2021-04-20 DIAGNOSIS — F411 Generalized anxiety disorder: Secondary | ICD-10-CM | POA: Diagnosis not present

## 2021-04-20 NOTE — Progress Notes (Signed)
Virtual Visit via Video Note  I connected with Bridget [name] on 46/95/07 at  3:30 PM EDT by a video enabled telemedicine application and verified that I am speaking with the correct person using two identifiers.  Location: Patient: HOME Provider: OFFICE   History of Present Illness: MDD and PTSD   Treatment Plan Goals: 1) Bridget [name] would like to process grief and loss experiences to decrease depressive symptoms. 2) Bridget [name] would like to process and heal from trauma experiences to decrease instances of trauma triggers/reactions with impact her daily functioning and fulfillment in relationships.  3) Bridget [name] will set and reenforce healthy boundaries with former partner and adult children in 4 out of 5 interactions, reporting effectiveness in session.   Observations/Objective: Counselor met with Client for individual therapy via webex. Counselor assessed MH symptoms and progress on treatment plan goals, with patient reporting that she is in a reflective state and is managing symptoms on a day to day basis. Client presents with mild depression and moderate anxiety. Client denied suicidal ideation or self-harm behaviors.    Goals 1-3) Counselor used CBT interventions to process grief and trauma responses/reminders, Client reports experiences since our last session. Client states that 2 of her family members went to jail for different reasons over the past week, which brought up a variety of memories and emotions. Client recalled weight loss and gains related to losses and trauma throughout her life time. Counselor prompted Client to identify warning signs of emotional eating habits, along with alternative coping behaviors in processing/dealing with emotions. Client able to name a variety of alternative methods and willingness to be more mindful in eating practices in association with trauma and grief.   Client shared how she established boundaries and boundary setting in approximately 5 of 5 of her  interactions with family. Counselor praised Client for reaching goal and processed how boundary setting positively influences her overall wellness and stability. Client named challenges and barriers within family dynamics, as well as naming positive feelings when boundaries are adhered to.    Assessment and Plan: Counselor will continue to meet with patient to address treatment plan goals. Patient will continue to follow recommendations of providers and implement skills learned in session.   Follow Up Instructions: Counselor will send information for next session via e-mail.   The patient was advised to call back or seek an in-person evaluation if the symptoms worsen or if the condition fails to improve as anticipated.   I provided 55 minutes of face-to-face time during this encounter.     [name], LCSW

## 2021-05-04 ENCOUNTER — Ambulatory Visit (INDEPENDENT_AMBULATORY_CARE_PROVIDER_SITE_OTHER): Payer: Medicaid Other | Admitting: Psychiatry

## 2021-05-04 ENCOUNTER — Other Ambulatory Visit: Payer: Self-pay

## 2021-05-04 ENCOUNTER — Encounter (HOSPITAL_COMMUNITY): Payer: Self-pay | Admitting: Psychiatry

## 2021-05-04 DIAGNOSIS — F411 Generalized anxiety disorder: Secondary | ICD-10-CM | POA: Diagnosis not present

## 2021-05-04 DIAGNOSIS — F4310 Post-traumatic stress disorder, unspecified: Secondary | ICD-10-CM | POA: Diagnosis not present

## 2021-05-04 NOTE — Progress Notes (Signed)
Virtual Visit via Video Note  I connected with Bridget [name] on 60/45/40 at  3:30 PM EDT by a video enabled telemedicine application and verified that I am speaking with the correct person using two identifiers.  Location: Patient: HOME Provider: OFFICE   History of Present Illness: MDD and PTSD   Treatment Plan Goals: 1) Bridget [name] would like to process grief and loss experiences to decrease depressive symptoms. 2) Bridget [name] would like to process and heal from trauma experiences to decrease instances of trauma triggers/reactions with impact her daily functioning and fulfillment in relationships.  3) Bridget [name] will set and reenforce healthy boundaries with former partner and adult children in 4 out of 5 interactions, reporting effectiveness in session.   Observations/Objective: Counselor met with Client for individual therapy via webex. Counselor assessed MH symptoms and progress on treatment plan goals, with patient reporting that she is currently energized and motivated to reorganize her bedroom. Client paused activities to engage in session. Client presents with mild depression and mild anxiety. Client denied suicidal ideation or self-harm behaviors.    Goals 1-3) Counselor used CBT interventions to process grief and trauma responses/reminders, Client reports experiences since our last session. Client identified that she's been flashing back to two years ago when she was in the midst of homelessness, separating from husband and onset of medical issues. Client reports grieving the lack of support she had at the time. Client noted ruminations about statements and interactions from individuals at that time. Counselor reviewed cognitive coping strategies and psychoeducation on impacts of trauma to combat current symptoms. Client expressed that being able to talk about and process feelings now in session has been helpful.    Client shared how she established boundaries and boundary setting in approximately  4 of 5 of her interactions with family. Client shared about recent life events in her family that are impacting dynamics and communications with former husband. Counselor states that she is setting firmer boundaries and talking more assertively to him, as he is communicating in triggering and inappropriate ways to her. Client is satisfied with how she is handling the interactions at this time.    Assessment and Plan: Counselor will continue to meet with patient to address treatment plan goals. Patient will continue to follow recommendations of providers and implement skills learned in session.   Follow Up Instructions: Counselor will send information for next session via e-mail.   The patient was advised to call back or seek an in-person evaluation if the symptoms worsen or if the condition fails to improve as anticipated.   I provided 55 minutes of face-to-face time during this encounter.    [name], LCSW

## 2021-05-25 ENCOUNTER — Other Ambulatory Visit: Payer: Self-pay

## 2021-05-25 ENCOUNTER — Ambulatory Visit (INDEPENDENT_AMBULATORY_CARE_PROVIDER_SITE_OTHER): Payer: Medicaid Other | Admitting: Psychiatry

## 2021-05-25 DIAGNOSIS — F4310 Post-traumatic stress disorder, unspecified: Secondary | ICD-10-CM

## 2021-05-25 DIAGNOSIS — F411 Generalized anxiety disorder: Secondary | ICD-10-CM

## 2021-05-25 NOTE — Progress Notes (Signed)
Virtual Visit via Video Note  I connected with Bridget [name] on 11/[birth year] at  2:30 PM EDT by a video enabled telemedicine application and verified that I am speaking with the correct person using two identifiers.  Location: Patient: HOME Provider: OFFICE   History of Present Illness: MDD and PTSD   Treatment Plan Goals: 1) Dali would like to process grief and loss experiences to decrease depressive symptoms. 2) [name] would like to process and heal from trauma experiences to decrease instances of trauma triggers/reactions with impact her daily functioning and fulfillment in relationships.  3) [name] will set and reenforce healthy boundaries with former partner and adult children in 4 out of 5 interactions, reporting effectiveness in session.   Observations/Objective: Counselor met with Client for individual therapy via webex. Counselor assessed MH symptoms and progress on treatment plan goals, with patient reporting that due to several appointments and errands, she was feeling tired and hungry at the start of session. Client was able to attend to hunger as we continued our session. Client reports depressive days and trauma reminders. Client presents with moderate depression and moderate anxiety. Client denied suicidal ideation or self-harm behaviors.    Goals 1-3) Counselor used CBT interventions to process grief and trauma responses/reminders, Client reports experiences since our last session. Counselor reviewed coping strategies with Client identifying ways to apply skills when triggered. Client understands that grief is a journey and healing from traumas takes intentional work and time. Client actively faces automatic responses to grief and trauma in order to self-regulate and process in a healthier manner.    Client shared how she established boundaries and boundary setting in approximately 5 of 5 of her interactions with family. Client shared about standing up to former partner when he  attempts to make her do things she is uncomfortable with doing, or set boundaries on in the past. He is becoming more distant at times and it is shifting the dynamics of their relationship. Counselor praised the Client for intentional application of skills and her follow through. Client notes her children see positive changes in her overall mental and emotional health.    Assessment and Plan: Counselor will continue to meet with patient to address treatment plan goals. Patient will continue to follow recommendations of providers and implement skills learned in session.   Follow Up Instructions: Counselor will send information for next session via e-mail.   The patient was advised to call back or seek an in-person evaluation if the symptoms worsen or if the condition fails to improve as anticipated.   I provided 53 minutes of face-to-face time during this encounter.     Bridget Auer, LCSW

## 2021-05-26 ENCOUNTER — Encounter (HOSPITAL_COMMUNITY): Payer: Self-pay | Admitting: Psychiatry

## 2021-06-17 ENCOUNTER — Encounter (HOSPITAL_COMMUNITY): Payer: Self-pay | Admitting: Psychiatry

## 2021-06-17 ENCOUNTER — Ambulatory Visit (HOSPITAL_COMMUNITY): Payer: Medicaid Other | Admitting: Psychiatry

## 2021-06-17 ENCOUNTER — Ambulatory Visit (INDEPENDENT_AMBULATORY_CARE_PROVIDER_SITE_OTHER): Payer: Medicaid Other | Admitting: Psychiatry

## 2021-06-17 ENCOUNTER — Other Ambulatory Visit: Payer: Self-pay

## 2021-06-17 DIAGNOSIS — F411 Generalized anxiety disorder: Secondary | ICD-10-CM

## 2021-06-17 DIAGNOSIS — F4310 Post-traumatic stress disorder, unspecified: Secondary | ICD-10-CM

## 2021-06-17 NOTE — Progress Notes (Signed)
Virtual Visit via Video Note  I connected with [name] on [birth year] at  1:00 PM EDT by a video enabled telemedicine application and verified that I am speaking with the correct person using two identifiers.  Location: Patient: HOME Provider: OFFICE   History of Present Illness: MDD and PTSD   Treatment Plan Goals: 1) Brienna would like to process grief and loss experiences to decrease depressive symptoms. 2) Valeen would like to process and heal from trauma experiences to decrease instances of trauma triggers/reactions with impact her daily functioning and fulfillment in relationships.  3) Alan will set and reenforce healthy boundaries with former partner and adult children in 4 out of 5 interactions, reporting effectiveness in session.   Observations/Objective: Counselor met with Client for individual therapy via webex. Counselor assessed MH symptoms and progress on treatment plan goals, with patient reporting that she was caring for a friend and managing well. Client presents with mild depression and mild anxiety. Client denied suicidal ideation or self-harm behaviors.    Goals 1-3) Counselor used CBT interventions to process grief and trauma responses/reminders, Client reports experiences since our last session. Counselor reviewed coping strategies with Client identifying ways to apply skills when triggered. Client noted no marked grief reminders came up for her over past month. Client noted that she is handling intrusive depressive thoughts better than in the past utilizing coping skills learned throughout therapy. No needs at this time.    Client shared how she established boundaries and boundary setting in approximately 5 of 5 of her interactions with family. Client reports that she has maintained appropriate boundaries with former partner and her adult children since our last session. Client gave specific examples with Counselor praising Client for application and willingness to  care for self by expressing needs.   Counselor shared that today would be our last session, as Counselor with be ending time with [facility] effective October 1. Client was understanding and we discussed an appropriate transfer of care. Client to remain established with practice for counseling and psychiatry. Client aware of how to follow up and follow safety plan if needed.   Assessment and Plan: New Counselor will continue to meet with patient to address and reestablish treatment plan goals. Patient will continue to follow recommendations of providers and implement skills learned in session.   Follow Up Instructions: New Counselor will send information for next session via Webex.    The patient was advised to call back or seek an in-person evaluation if the symptoms worsen or if the condition fails to improve as anticipated.   I provided 55 minutes of face-to-face time during this encounter.     [name], [place], 

## 2021-07-01 ENCOUNTER — Encounter (HOSPITAL_COMMUNITY): Payer: Self-pay | Admitting: Psychiatry

## 2021-07-01 ENCOUNTER — Other Ambulatory Visit: Payer: Self-pay

## 2021-07-01 ENCOUNTER — Ambulatory Visit (INDEPENDENT_AMBULATORY_CARE_PROVIDER_SITE_OTHER): Payer: Medicaid Other | Admitting: Psychiatry

## 2021-07-01 DIAGNOSIS — F4310 Post-traumatic stress disorder, unspecified: Secondary | ICD-10-CM | POA: Diagnosis not present

## 2021-07-01 DIAGNOSIS — F411 Generalized anxiety disorder: Secondary | ICD-10-CM | POA: Diagnosis not present

## 2021-07-01 NOTE — Progress Notes (Signed)
Virtual Visit via Video Note  I connected with Bridget [name] on 05/[birth year]/79 at  2:00 PM EDT by a video enabled telemedicine application and verified that I am speaking with the correct person using two identifiers.  Location: Patient: HOME Provider: OFFICE   History of Present Illness: MDD and PTSD   Treatment Plan Goals: 1) Bridget [name] would like to process grief and loss experiences to decrease depressive symptoms. 2) Bridget [name] would like to process and heal from trauma experiences to decrease instances of trauma triggers/reactions with impact her daily functioning and fulfillment in relationships.  3) Bridget [name] will set and reenforce healthy boundaries with former partner and adult children in 4 out of 5 interactions, reporting effectiveness in session.   Observations/Objective: Counselor met with Client for family therapy via webex. Counselor assessed MH symptoms and progress on treatment plan goals, with patient reporting that she is upset today due to 2 of her children experiencing health issues and an argument with her former husband yesterday. Client requested for resources for him and if he could join session today. Counselor agreed and Client 3 wayed him into the meeting. Counselor moderate a discussion sharing psychoeducation on therapy resources and the therapeutic process for individuals and couples. Client former partner engaged well in session and shared his concerns for them both. Client contributed to discussion and praised him for willingness to seek help. Client provided a list of providers in his area to contact.  Client presents with mild depression and mild anxiety. Client denied suicidal ideation or self-harm behaviors.    Counselor wrapped up time with Client, reflecting on progress of goals and celebrating her engagement in therapy. Client expressed gratitude for therapeutic process and relationship.   Counselor shared that today would be our last session, as Counselor with be ending  time with [facility] effective October 1. Client was understanding and we discussed an appropriate transfer of care. Client to remain established with practice for counseling and psychiatry. Client aware of how to follow up and follow safety plan if needed.    Assessment and Plan: New Counselor will continue to meet with patient to address and reestablish treatment plan goals. Patient will continue to follow recommendations of providers and implement skills learned in session.   Follow Up Instructions: New Counselor will send information for next session via Webex.    The patient was advised to call back or seek an in-person evaluation if the symptoms worsen or if the condition fails to improve as anticipated.   I provided 53 minutes of face-to-face time during this encounter.     [name], LCSW

## 2021-07-21 ENCOUNTER — Other Ambulatory Visit: Payer: Self-pay

## 2021-07-21 ENCOUNTER — Ambulatory Visit (HOSPITAL_COMMUNITY): Payer: Medicaid Other | Admitting: Licensed Clinical Social Worker

## 2021-09-02 ENCOUNTER — Ambulatory Visit: Payer: Self-pay | Admitting: Family Medicine

## 2021-09-09 ENCOUNTER — Other Ambulatory Visit: Payer: Self-pay

## 2021-09-09 ENCOUNTER — Other Ambulatory Visit (HOSPITAL_COMMUNITY)
Admission: RE | Admit: 2021-09-09 | Discharge: 2021-09-09 | Disposition: A | Discharge status: Home / Self Care | Payer: Medicaid Other | Source: Ambulatory Visit | Attending: Family Medicine | Admitting: Family Medicine

## 2021-09-09 ENCOUNTER — Encounter: Payer: Self-pay | Admitting: Family Medicine

## 2021-09-09 ENCOUNTER — Ambulatory Visit (INDEPENDENT_AMBULATORY_CARE_PROVIDER_SITE_OTHER): Payer: Medicaid Other | Admitting: Family Medicine

## 2021-09-09 VITALS — BP 143/71 | HR 93 | Ht 61.0 in | Wt 228.4 lb

## 2021-09-09 DIAGNOSIS — B379 Candidiasis, unspecified: Secondary | ICD-10-CM | POA: Diagnosis not present

## 2021-09-09 DIAGNOSIS — Z1159 Encounter for screening for other viral diseases: Secondary | ICD-10-CM

## 2021-09-09 DIAGNOSIS — Z113 Encounter for screening for infections with a predominantly sexual mode of transmission: Secondary | ICD-10-CM | POA: Diagnosis present

## 2021-09-09 DIAGNOSIS — B009 Herpesviral infection, unspecified: Secondary | ICD-10-CM | POA: Diagnosis not present

## 2021-09-09 LAB — POCT WET PREP (WET MOUNT)
Clue Cells Wet Prep Whiff POC: NEGATIVE
Trichomonas Wet Prep HPF POC: ABSENT

## 2021-09-09 MED ORDER — FLUCONAZOLE 150 MG PO TABS: 150.0000 mg | ORAL_TABLET | ORAL | 0 refills | Status: DC

## 2021-09-09 NOTE — Patient Instructions (Addendum)
Stop by the pharmacy to pick up your yeast infection medications. I will call you if something is abnormal with your labs otherwise expect a letter with your results.   Take Care,   Dr. Rachael Darby

## 2021-09-09 NOTE — Progress Notes (Signed)
        SUBJECTIVE:   CHIEF COMPLAINT / HPI:   Chief Complaint  Patient presents with   Vaginal Discharge    Bridget [name] is a 60 y.o. female presents for vaginal itching.  In April she had sex with her ex-husband shortly after she started "hurting down there". The itching when away and then returned. Notes frequent scratching a sore area on her left labia. No recent antibiotic use. Denies odor, abdominal pain, dysuria or hematuria, nausea or vomiting, fevers or pelvic pain. She requests STD testing.       PERTINENT  PMH / PSH: reviewed and updated as appropriate   OBJECTIVE:   BP (!) 143/71   Pulse 93   Ht 5\' 1"  (1.549 m)   Wt 228 lb 6.4 oz (103.6 kg)   LMP  (LMP Unknown)   SpO2 99%   BMI 43.16 kg/m   GEN: well appearing female in no acute distress  CVS: well perfused  RESP: speaking in full sentences without pause  ABD: soft, non-tender, non-distended, no palpable masses  Pelvic exam: left labia with irregular shaped ulcerative lesion, punctate red lesion surrounding clitoral hood,  cervix: normal appearing cervix without discharge or lesions, co CMT, WET MOUNT done - results: yeast, KOH done, GC/CT collected, exam chaperoned by CMA.     ASSESSMENT/PLAN:   Vaginal Itching  Yeast confirmed on wet prep. GC and chlamydia DNA  probe sent to lab. HIV, HSV and RPR collected. Advised patient to use barrier protection/condoms.  - Treatment: Diflucan 150 mg  - F/U if symptoms not improving or getting worse.  - F/U with PCP as needed.  - Return precautions including abdominal pain, fever, chills, nausea, or vomiting given.  - PAP Smear 07/2020 was normal. Patient is postmenopausal.      08/2020, DO [facility]

## 2021-09-10 LAB — HSV-2 IGG SUPPLEMENTAL TEST: HSV-2 IgG Supplemental Test: POSITIVE — AB

## 2021-09-10 LAB — HEPATITIS C ANTIBODY: Hep C Virus Ab: <0.1 s/co ratio (ref 0.0–0.9)

## 2021-09-10 LAB — HSV(HERPES SIMPLEX VRS) I + II AB-IGG
HSV 1 Glycoprotein G Ab, IgG: 28.7 index — ABNORMAL HIGH (ref 0.00–0.90)
HSV 2 IgG, Type Spec: 4.15 index — ABNORMAL HIGH (ref 0.00–0.90)

## 2021-09-10 LAB — CERVICOVAGINAL ANCILLARY ONLY
Chlamydia: NEGATIVE
Comment: NEGATIVE
Comment: NEGATIVE
Comment: NORMAL
Neisseria Gonorrhea: NEGATIVE
Trichomonas: NEGATIVE

## 2021-09-10 LAB — HIV ANTIBODY (ROUTINE TESTING W REFLEX): HIV Screen 4th Generation wRfx: NONREACTIVE

## 2021-09-12 ENCOUNTER — Encounter: Payer: Self-pay | Admitting: Family Medicine

## 2021-09-12 MED ORDER — VALACYCLOVIR HCL 1 G PO TABS: 1000.0000 mg | ORAL_TABLET | Freq: Two times a day (BID) | ORAL | 0 refills | Status: AC

## 2021-09-13 ENCOUNTER — Telehealth: Payer: Self-pay | Admitting: Family Medicine

## 2021-09-13 NOTE — Telephone Encounter (Signed)
Patient is returning Dr. Johnette Abraham call. She would like for her to call her back when she gets a chance.   The best call back is [phone]

## 2021-09-14 NOTE — Telephone Encounter (Signed)
Called pt to discuss recent HSV results. Discussed disease course with her and route of transmission. She has picked up the Rx.  All questions asked were answered.    Katha Cabal, DO

## 2021-10-05 ENCOUNTER — Telehealth: Payer: Self-pay | Admitting: Family Medicine

## 2021-10-05 NOTE — Telephone Encounter (Signed)
Patient called stating she has not received her results in the mail, she stated she was supposed to get a letter mailed to her with the results for her last appointment.

## 2022-01-11 IMAGING — XA DG FLUORO GUIDE NDL PLC/BX
1 series · 1 of 1 positions shown · non-contrast
Comparison: none

CLINICAL DATA: Left shoulder pain.

[Series 1: ortho adipose · 1 of 1 slices shown]
[im 1/1]
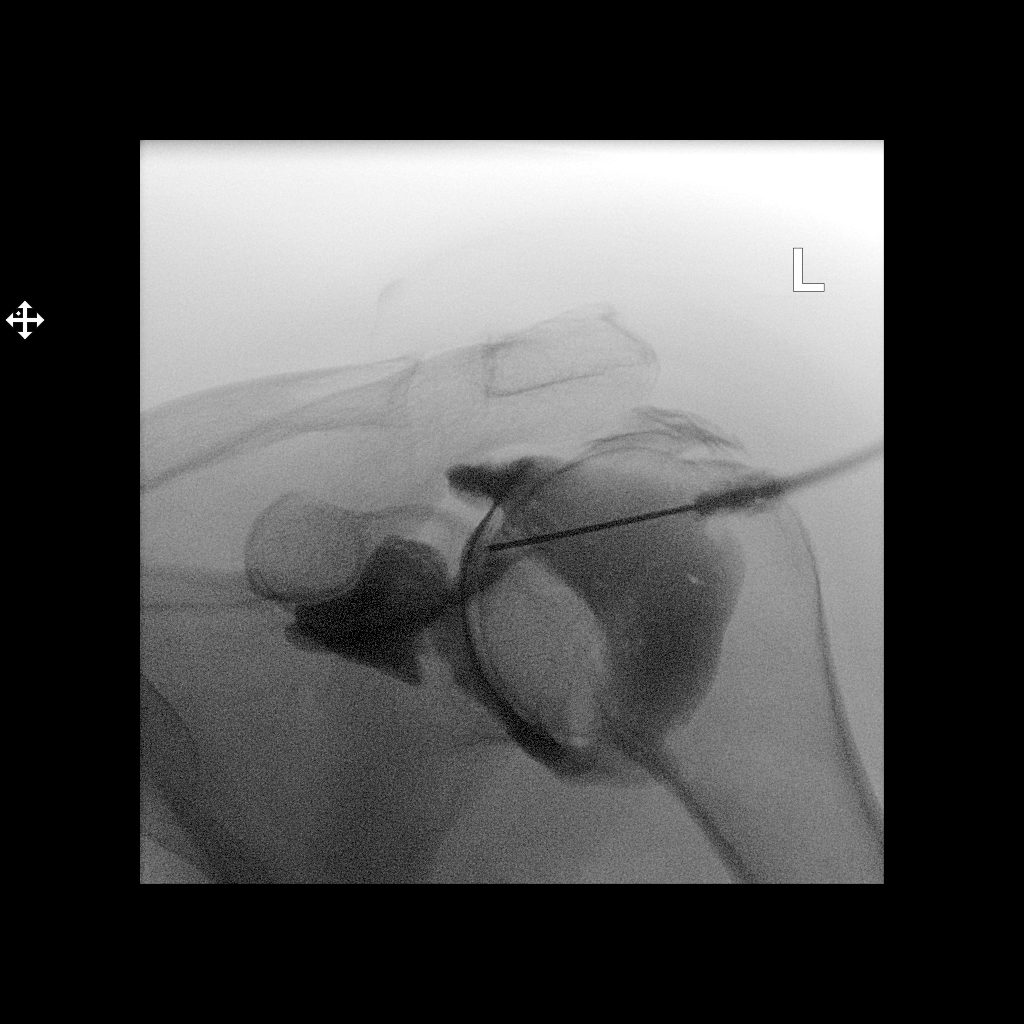

[1 of 1 positions shown; findings below may reference images not displayed]

FLUOROSCOPY TIME:  Radiation Exposure Index (as provided by the
fluoroscopic device): 5.49 uGy*m2

PROCEDURE:
Left SHOULDER INJECTION UNDER FLUOROSCOPY

An appropriate skin entrance site was determined. The site was
marked, prepped with Betadine, draped in the usual sterile fashion,
and infiltrated locally with buffered Lidocaine. 20 gauge spinal
needle was advanced to the superomedial margin of the humeral head
under intermittent fluoroscopy. 1 ml of Lidocaine injected easily. A
mixture of 0.1 ml Multihance and 20 ml of dilute Isovue M 200 was
then used to opacify the left shoulder capsule. No immediate
complication.
IMPRESSION: Technically successful left shoulder injection for MRI.

## 2022-02-21 ENCOUNTER — Ambulatory Visit: Payer: Medicaid Other | Admitting: Family Medicine

## 2022-02-21 NOTE — Patient Instructions (Incomplete)
Thank you for coming to see me today. It was a pleasure.  ***  Please follow-up with as neededThank you for coming to see me today. It was a pleasure. Today we talked about:   ***  Please follow-up with *** in ***  If you have any questions or concerns, please do not hesitate to call the office at [phone].  Best,   Carollee Leitz, MD    If you have any questions or concerns, please do not hesitate to call the office at [phone].  Best,   Carollee Leitz, MD

## 2022-02-21 NOTE — Progress Notes (Deleted)
° ° °  SUBJECTIVE:  ° °CHIEF COMPLAINT / HPI:  ° °*** ° °PERTINENT  PMH / PSH: *** ° °OBJECTIVE:  ° °LMP  (LMP Unknown)   ° °General: Alert, no acute distress °Cardio: Normal S1 and S2, RRR, no r/m/g °Pulm: CTAB, normal work of breathing °Abdomen: Bowel sounds normal. Abdomen soft and non-tender.  °Extremities: No peripheral edema.  °Neuro: Cranial nerves grossly intact ° ° °ASSESSMENT/PLAN:  ° °No problem-specific Assessment & Plan notes found for this encounter. °  ° ° °Davyn Elsasser, MD °[facility]  °

## 2022-02-24 ENCOUNTER — Encounter: Payer: Self-pay | Admitting: Family Medicine

## 2022-02-24 ENCOUNTER — Ambulatory Visit (INDEPENDENT_AMBULATORY_CARE_PROVIDER_SITE_OTHER): Payer: Medicaid Other | Admitting: Family Medicine

## 2022-02-24 VITALS — BP 124/79 | HR 85 | Ht 61.0 in | Wt 226.6 lb

## 2022-02-24 DIAGNOSIS — Z1231 Encounter for screening mammogram for malignant neoplasm of breast: Secondary | ICD-10-CM | POA: Diagnosis not present

## 2022-02-24 DIAGNOSIS — R252 Cramp and spasm: Secondary | ICD-10-CM | POA: Diagnosis not present

## 2022-02-24 DIAGNOSIS — Z0000 Encounter for general adult medical examination without abnormal findings: Secondary | ICD-10-CM

## 2022-02-24 DIAGNOSIS — M6788 Other specified disorders of synovium and tendon, other site: Secondary | ICD-10-CM | POA: Diagnosis not present

## 2022-02-24 DIAGNOSIS — R7303 Prediabetes: Secondary | ICD-10-CM

## 2022-02-24 MED ORDER — DICLOFENAC SODIUM 1 % EX GEL: 4.0000 g | Freq: Four times a day (QID) | CUTANEOUS | 2 refills | Status: AC

## 2022-02-24 NOTE — Progress Notes (Signed)
    SUBJECTIVE:   CHIEF COMPLAINT / HPI: Bilateral ankle pain  Patient reports having bilateral ankle pain has been ongoing for 3 to 4 months.  Pain worse when walking.  Noticed mild swelling after walking.  Denies any trauma, previous injury, fevers, weakness, numbness or tingling.  Reports sometimes wakes her up at night.  She is currently taking Hydrocodone 10-325 mg 4-6 times daily, Ibuprofen, Flexeril, Robaxin 500 mg 4 times daily that was prescribed at the pain management clinic.   PERTINENT  PMH / PSH:  Obesity class III Tobacco use OA of the spine and hips Plan of fasciitis Adhesive capsulitis of right shoulder Bipolar 2 disorder Schizoaffective disorder Fibromyalgia Chronic pain syndrome Bilateral foot pain   OBJECTIVE:   BP 124/79   Pulse 85   Ht 5\' 1"  (1.549 m)   Wt 226 lb 9.6 oz (102.8 kg)   LMP  (LMP Unknown)   SpO2 99%   BMI 42.82 kg/m    General: Alert, no acute distress Cardio: Normal S1 and S2, RRR, no r/m/g Pulm: CTAB, normal work of breathing Bilateral ankle exam: No gross deformity, swelling, ecchymoses FROM TTP peroneal tendon on the left and right lateral aspect of ankles Negative ant drawer and negative talar tilt.   Negative syndesmotic compression. Thompsons test negative. NV intact distally.    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  Low suspicion for fracture given no trauma and ankle stability within normal limits. -Will add diclofenac gel 4 times daily -Decreased repetitive activities -Supportive shoes -Once pain is resolved can start gentle exercises and stretching -Follow-up in 2 weeks with PCP -If no improvement at that time can consider sports medicine referral for evaluation and ultrasound.  Other and unspecified hyperlipidemia Lipid panel today  Obesity, morbid Elevated BMI CMet today  Encouraged healthy lifestyle Follow-up with  results  Healthcare maintenance Mammogram referral today CBC at patient's request Recommend shingles vaccine Will address colonoscopy at next visit, patient had previously declined Patient on chronic narcotics, recommend Narcan nasal prescription.  We will check with patient at next visit to see if she has this previously ordered.      Carollee Leitz, MD [place]

## 2022-02-24 NOTE — Patient Instructions (Signed)
Thank you for coming to see me today. It was a pleasure. Today we talked about:   I have placed an order for your mammogram.  Please call [facility] at [phone] to schedule your appointment within one week.   You are due for a colonoscopy.  Please use the form that we have given you to schedule this at your convenience.    Recommend Shingles vaccine.  This is a 2 dose series and can be given at your local pharmacy.  Please talk to your pharmacist about this.    Please follow-up with PCP in 2 weeks  If you have any questions or concerns, please do not hesitate to call the office at [phone].  Best,   Dana Allan, MD    Peroneal Tendinopathy  Peroneal tendinopathy is irritation of the tendons that pass behind your ankle (peroneal tendons). These tendons attach muscles in your foot to a bone on the side of your foot and underneath the arch of your foot. This condition can cause your peroneal tendons to get bigger and swell. What are the causes? This condition may be caused by: Putting stress on your ankle over and over again (overuse injury). A sudden injury that puts stress on your tendons, such as an ankle sprain. What increases the risk? You are more likely to develop this condition if you: Have high arches. Play sports that involve putting stress on the ankle over and over again. These sports include: Running. Dancing. Soccer. Basketball. What are the signs or symptoms? Symptoms of this condition can start suddenly or develop gradually. Symptoms of this condition include: Pain in the back of the ankle, on the side of the foot, or in the arch of the foot. Pain that gets worse with activity and better with rest. Swelling. Warmth. Weakness in your foot or ankle. How is this diagnosed? This condition may be diagnosed based on: Your symptoms. Your medical history. A physical exam. During the exam, your health care provider may move your foot and ankle and test  the strength of your leg muscles. Imaging tests, such as: X-rays or a CT scan to check for bone injury. MRI or ultrasound to check for muscle or tendon injury. How is this treated? This condition may be treated by: Keeping your body weight off your ankle for several days. Returning to full activity gradually. Putting ice on your ankle to reduce swelling. Taking NSAIDs, such as ibuprofen. Having medicine injected into your tendon to reduce swelling. Wearing a removable boot or brace for ankle support. Doing range-of-motion exercises and strengthening exercises (physical therapy) when pain and swelling improve. If the condition does not improve with treatment, or if a tendon or muscle is damaged, surgery may be needed. Follow these instructions at home: If you have a boot or brace: Wear the boot or brace as told by your health care provider. Remove it only as told by your health care provider. Loosen the boot or brace if your toes tingle, become numb, or turn cold and blue. Keep the boot or brace clean. If the boot or brace is not waterproof: Do not let it get wet. Cover it with a watertight covering when you take a bath or shower. Managing pain, stiffness, and swelling  If directed, put ice on the injured area. If you have a removable boot or brace, remove it as told by your health care provider. Put ice in a plastic bag. Place a towel between your skin and the bag. Leave the  ice on for 20 minutes, 2-3 times a day. Move your toes often to reduce stiffness and swelling. Raise (elevate) your ankle above the level of your heart while you are sitting or lying down. Activity Do not do activities that make pain or swelling worse. Do exercises as told by your health care provider. Return to your normal activities as told by your health care provider. Ask your health care provider what activities are safe for you. Ask your health care provider when it is safe to drive if you have a boot or  brace on your foot. General instructions Take over-the-counter and prescription medicines only as told by your health care provider. Do not use any products that contain nicotine or tobacco, such as cigarettes, e-cigarettes, and chewing tobacco. These can delay healing. If you need help quitting, ask your health care provider. Keep all follow-up visits as told by your health care provider. This is important. How is this prevented? Wear supportive footwear that is appropriate for your athletic activity. Avoid athletic activities that cause swelling or pain in your ankle or foot. See your health care provider if you have pain or swelling that does not improve after a few days of rest. Stop training if you develop pain or swelling. If you start a new athletic activity, start gradually to build up your strength, endurance, and flexibility. Warm up and stretch before being active. Cool down and stretch after being active. Contact a health care provider if: Your symptoms get worse. Your symptoms do not improve in 2-4 weeks. You develop new, unexplained symptoms. Summary Peroneal tendinopathy is irritation of the tendons that pass behind your ankle. This condition is caused by overuse or sudden injury to the peroneal tendon. Symptoms include pain, swelling, warmth, and weakness in your foot or ankle. This condition is treated with rest, ice, medicines, physical therapy, and surgery if needed. This information is not intended to replace advice given to you by your health care provider. Make sure you discuss any questions you have with your health care provider. Document Revised: 01/10/2019 Document Reviewed: 10/29/2018 Elsevier Patient Education  2023 [employer].

## 2022-02-25 LAB — COMPREHENSIVE METABOLIC PANEL
ALT: 22 IU/L (ref 0–32)
AST: 21 IU/L (ref 0–40)
Albumin/Globulin Ratio: 2 (ref 1.2–2.2)
Albumin: 4.7 g/dL (ref 3.8–4.9)
Alkaline Phosphatase: 83 IU/L (ref 44–121)
BUN/Creatinine Ratio: 14 (ref 9–23)
BUN: 13 mg/dL (ref 6–24)
Bilirubin Total: 0.2 mg/dL (ref 0.0–1.2)
CO2: 19 mmol/L — ABNORMAL LOW (ref 20–29)
Calcium: 9.6 mg/dL (ref 8.7–10.2)
Chloride: 105 mmol/L (ref 96–106)
Creatinine, Ser: 0.91 mg/dL (ref 0.57–1.00)
Globulin, Total: 2.4 g/dL (ref 1.5–4.5)
Glucose: 95 mg/dL (ref 70–99)
Potassium: 4.5 mmol/L (ref 3.5–5.2)
Sodium: 139 mmol/L (ref 134–144)
Total Protein: 7.1 g/dL (ref 6.0–8.5)
eGFR: 73 mL/min/{1.73_m2} (ref >59)

## 2022-02-25 LAB — CBC
Hematocrit: 35.7 % (ref 34.0–46.6)
Hemoglobin: 12.2 g/dL (ref 11.1–15.9)
MCH: 29.5 pg (ref 26.6–33.0)
MCHC: 34.2 g/dL (ref 31.5–35.7)
MCV: 86 fL (ref 79–97)
Platelets: 281 10*3/uL (ref 150–450)
RBC: 4.13 x10E6/uL (ref 3.77–5.28)
RDW: 13.7 % (ref 11.7–15.4)
WBC: 9.2 10*3/uL (ref 3.4–10.8)

## 2022-02-25 LAB — LIPID PANEL
Chol/HDL Ratio: 5.3 ratio — ABNORMAL HIGH (ref 0.0–4.4)
Cholesterol, Total: 205 mg/dL — ABNORMAL HIGH (ref 100–199)
HDL: 39 mg/dL — ABNORMAL LOW (ref >39)
LDL Chol Calc (NIH): 133 mg/dL — ABNORMAL HIGH (ref 0–99)
Triglycerides: 184 mg/dL — ABNORMAL HIGH (ref 0–149)
VLDL Cholesterol Cal: 33 mg/dL (ref 5–40)

## 2022-03-01 ENCOUNTER — Encounter: Payer: Self-pay | Admitting: Family Medicine

## 2022-03-01 DIAGNOSIS — M6788 Other specified disorders of synovium and tendon, other site: Secondary | ICD-10-CM | POA: Insufficient documentation

## 2022-03-01 NOTE — Assessment & Plan Note (Signed)
Lipid panel today

## 2022-03-01 NOTE — Assessment & Plan Note (Addendum)
Mammogram referral today CBC at patient's request Recommend shingles vaccine Will address colonoscopy at next visit, patient had previously declined Patient on chronic narcotics, recommend Narcan nasal prescription.  We will check with patient at next visit to see if she has this previously ordered.

## 2022-03-01 NOTE — Assessment & Plan Note (Signed)
Elevated BMI CMet today  Encouraged healthy lifestyle Follow-up with results

## 2022-03-01 NOTE — Assessment & Plan Note (Signed)
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  Low suspicion for fracture given no trauma and ankle stability within normal limits. -Will add diclofenac gel 4 times daily -Decreased repetitive activities -Supportive shoes -Once pain is resolved can start gentle exercises and stretching -Follow-up in 2 weeks with PCP -If no improvement at that time can consider sports medicine referral for evaluation and ultrasound.

## 2022-03-07 ENCOUNTER — Ambulatory Visit: Payer: Medicaid Other | Admitting: Family Medicine

## 2022-03-07 NOTE — Patient Instructions (Incomplete)
Thank you for coming to see me today. It was a pleasure. Today we talked about:  ? ?Will MyChart you the results of swabs today.  If needing treatment will send in prescription to your pharmacy.  ? ?Please follow-up with PCP as needed ? ?If you have any questions or concerns, please do not hesitate to call the office at [phone]. ? ?Best,  ? ?Alecia Doi, MD   ? ? ? ? ?

## 2022-03-07 NOTE — Progress Notes (Deleted)
    SUBJECTIVE:   CHIEF COMPLAINT / HPI:   ***  PERTINENT  PMH / PSH: ***  OBJECTIVE:   LMP  (LMP Unknown)    General: Alert, no acute distress Cardio: Normal S1 and S2, RRR, no r/m/g Pulm: CTAB, normal work of breathing Abdomen: Bowel sounds normal. Abdomen soft and non-tender.  Extremities: No peripheral edema.  Neuro: Cranial nerves grossly intact Pelvic Exam chaperoned by CMA **         External: normal female genitalia without lesions or masses        Vagina: normal without lesions or masses        Cervix: normal without lesions or masses           ASSESSMENT/PLAN:   No problem-specific Assessment & Plan notes found for this encounter.     Dana Allan, MD [facility]

## 2022-03-17 ENCOUNTER — Ambulatory Visit
Admission: RE | Admit: 2022-03-17 | Discharge: 2022-03-17 | Disposition: A | Discharge status: Home / Self Care | Payer: Medicaid Other | Source: Ambulatory Visit | Attending: Family Medicine | Admitting: Family Medicine

## 2022-03-17 ENCOUNTER — Ambulatory Visit (INDEPENDENT_AMBULATORY_CARE_PROVIDER_SITE_OTHER): Payer: Medicaid Other | Admitting: Family Medicine

## 2022-03-17 ENCOUNTER — Encounter: Payer: Self-pay | Admitting: Family Medicine

## 2022-03-17 VITALS — BP 132/80 | HR 98 | Wt 220.0 lb

## 2022-03-17 DIAGNOSIS — R350 Frequency of micturition: Secondary | ICD-10-CM | POA: Diagnosis present

## 2022-03-17 DIAGNOSIS — Z1231 Encounter for screening mammogram for malignant neoplasm of breast: Secondary | ICD-10-CM

## 2022-03-17 LAB — POCT URINALYSIS DIP (MANUAL ENTRY)
Bilirubin, UA: NEGATIVE
Blood, UA: NEGATIVE
Glucose, UA: NEGATIVE mg/dL
Ketones, POC UA: NEGATIVE mg/dL
Leukocytes, UA: NEGATIVE
Nitrite, UA: NEGATIVE
Protein Ur, POC: 30 mg/dL — AB
Spec Grav, UA: 1.02 (ref 1.010–1.025)
Urobilinogen, UA: 0.2 E.U./dL
pH, UA: 5 (ref 5.0–8.0)

## 2022-03-17 NOTE — Patient Instructions (Signed)
Thank you for coming to see me today. It was a pleasure.   I will call you with the results of your urine test today and if needed treatment will let you know.  I will complete your placard for DMV.   Schedule appointment for next week to discuss your feet pain and referral.   If you have any questions or concerns, please do not hesitate to call the office at [phone].  Best,   Dana Allan, MD

## 2022-03-17 NOTE — Progress Notes (Signed)
    SUBJECTIVE:   CHIEF COMPLAINT / HPI:   Patient presents to clinic for possible urinary infection.  Reports increased sensation to urinate for about 2 to 3 weeks.  Also endorses urinary frequency and burning sensation upon urination.  Denies any fevers, abdominal pain, nausea/vomiting, diarrhea, constipation, vaginal discharge or vaginal irritation.   PERTINENT  PMH / PSH:  Urge incontinence  OBJECTIVE:   BP 132/80   Pulse 98   Wt 220 lb (99.8 kg)   LMP  (LMP Unknown)   SpO2 98%   BMI 41.57 kg/m    General: Alert, no acute distress Cardio: Normal S1 and S2, RRR, no r/m/g Pulm: CTAB, normal work of breathing Abdomen: Bowel sounds normal. Abdomen soft and non-tender.    ASSESSMENT/PLAN:   Increased urinary frequency Symptomatic UTI.  Patient also has urinary urge incontinence.  Urinalysis is negative for infection.  We will send urine culture.  If positive will treat.   Addendum 06/18 Urine culture preliminary results show greater than 100,000 CFU E. coli.  Sensitivities pending We will start Keflex 500 mg 4 times daily x5 days Spoke with patient to inform her of results and prescription for antibiotics.   Dana Allan, MD [facility]

## 2022-03-20 ENCOUNTER — Encounter: Payer: Self-pay | Admitting: Family Medicine

## 2022-03-20 DIAGNOSIS — R350 Frequency of micturition: Secondary | ICD-10-CM | POA: Insufficient documentation

## 2022-03-20 MED ORDER — CEPHALEXIN 500 MG PO CAPS: 500.0000 mg | ORAL_CAPSULE | Freq: Four times a day (QID) | ORAL | 0 refills | Status: AC

## 2022-03-20 NOTE — Assessment & Plan Note (Signed)
Symptomatic UTI.  Patient also has urinary urge incontinence.  Urinalysis is negative for infection.  We will send urine culture.  If positive will treat.

## 2022-03-21 ENCOUNTER — Telehealth: Payer: Self-pay | Admitting: Family Medicine

## 2022-03-21 ENCOUNTER — Encounter: Payer: Self-pay | Admitting: Family Medicine

## 2022-03-21 LAB — URINE CULTURE

## 2022-03-21 NOTE — Progress Notes (Signed)
Letter sent to patient wit results of urine and blood work.  Previously discussed results with patient and aware of treatment.  Dana Allan, MD [facility]

## 2022-03-21 NOTE — Telephone Encounter (Signed)
Pt informed of below.Bridget [name], CMA ? ?

## 2022-03-21 NOTE — Telephone Encounter (Signed)
DMV forms completed.  Placed in front office for patient pick up.  Please call patient.  Thank you Dana Allan, MD [facility] Res[facility]

## 2022-03-28 ENCOUNTER — Ambulatory Visit: Payer: Medicaid Other | Admitting: Family Medicine

## 2022-03-31 ENCOUNTER — Ambulatory Visit: Payer: Medicaid Other | Admitting: Family Medicine

## 2022-04-21 ENCOUNTER — Ambulatory Visit: Payer: Medicaid Other | Admitting: Family Medicine

## 2022-05-20 ENCOUNTER — Ambulatory Visit: Payer: Medicaid Other | Admitting: Family Medicine

## 2022-08-01 ENCOUNTER — Other Ambulatory Visit: Payer: Self-pay

## 2022-08-01 ENCOUNTER — Ambulatory Visit (INDEPENDENT_AMBULATORY_CARE_PROVIDER_SITE_OTHER): Payer: Medicaid Other | Admitting: Family Medicine

## 2022-08-01 VITALS — BP 109/76 | HR 89 | Wt 214.0 lb

## 2022-08-01 DIAGNOSIS — F39 Unspecified mood [affective] disorder: Secondary | ICD-10-CM

## 2022-08-01 DIAGNOSIS — G894 Chronic pain syndrome: Secondary | ICD-10-CM

## 2022-08-01 DIAGNOSIS — M62838 Other muscle spasm: Secondary | ICD-10-CM | POA: Diagnosis present

## 2022-08-01 NOTE — Assessment & Plan Note (Signed)
On Percocet which is managed by another clinic.

## 2022-08-01 NOTE — Patient Instructions (Addendum)
It was wonderful to see you today.  Please bring ALL of your medications with you to every visit.   Today we talked about:  We are doing lab work today to check your electrolytes and thyroid to see if this is causing you to have these spasms. I will send you a MyChart message if you have MyChart. Otherwise, I will give you a call for abnormal results or send a letter if everything returned back normal. If you don't hear from me in 2 weeks, please call the office.    Thank you for coming to your visit as scheduled. We have had a large "no-show" problem lately, and this significantly limits our ability to see and care for patients. As a friendly reminder- if you cannot make your appointment please call to cancel. We do have a no show policy for those who do not cancel within 24 hours. Our policy is that if you miss or fail to cancel an appointment within 24 hours, 3 times in a 41-month period, you may be dismissed from our clinic.   Thank you for choosing [facility].   Please call [phone] with any questions about today's appointment.  Please be sure to schedule follow up at the front  desk before you leave today.   Sharion Settler, DO PGY-3 Family Medicine    Muscle Cramps and Spasms Muscle cramps and spasms occur when a muscle or muscles tighten and you have no control over this tightening (involuntary muscle contraction). They are a common problem and can develop in any muscle. The most common place is in the calf muscles of the leg. Muscle cramps and muscle spasms are both involuntary muscle contractions, but there are some differences between the two: Muscle cramps are painful. They come and go and may last for a few seconds or up to 15 minutes. Muscle cramps are often more forceful and last longer than muscle spasms. Muscle spasms may or may not be painful. They may also last just a few seconds or much longer. Certain medical conditions, such as diabetes or  Parkinson's disease, can make it more likely to develop cramps or spasms. However, cramps or spasms are usually not caused by a serious underlying problem. Common causes include: Doing more physical work or exercise than your body is ready for (overexertion). Overuse from repeating certain movements too many times. Remaining in a certain position for a long period of time. Improper preparation, form, or technique while playing a sport or doing an activity. Dehydration. Injury. Side effects of some medicines. Abnormally low levels of the salts and minerals in your blood (electrolytes), especially potassium and calcium. This could happen if you are taking water pills (diuretics) or if you are pregnant. In many cases, the cause of muscle cramps or spasms is not known. Follow these instructions at home: Managing pain and stiffness     Try massaging, stretching, and relaxing the affected muscle. Do this for several minutes at a time. If directed, apply heat to tight or tense muscles as often as told by your health care provider. Use the heat source that your health care provider recommends, such as a moist heat pack or a heating pad. Place a towel between your skin and the heat source. Leave the heat on for 20-30 minutes. Remove the heat if your skin turns bright red. This is especially important if you are unable to feel pain, heat, or cold. You may have a greater risk of getting burned. If directed,  put ice on the affected area. This may help if you are sore or have pain after a cramp or spasm. Put ice in a plastic bag. Place a towel between your skin and the bag. Leave the ice on for 20 minutes, 2-3 times a day. Try taking hot showers or baths to help relax tight muscles. Eating and drinking Drink enough fluid to keep your urine pale yellow. Staying well hydrated may help prevent cramps or spasms. Eat a healthy diet that includes plenty of nutrients to help your muscles function. A healthy  diet includes fruits and vegetables, lean protein, whole grains, and low-fat or nonfat dairy products. General instructions If you are having frequent cramps, avoid intense exercise for several days. Take over-the-counter and prescription medicines only as told by your health care provider. Pay attention to any changes in your symptoms. Keep all follow-up visits as told by your health care provider. This is important. Contact a health care provider if: Your cramps or spasms get more severe or happen more often. Your cramps or spasms do not improve over time. Summary Muscle cramps and spasms occur when a muscle or muscles tighten and you have no control over this tightening (involuntary muscle contraction). The most common place for cramps or spasms to occur is in the calf muscles of the leg. Massaging, stretching, and relaxing the affected muscle may relieve the cramp or spasm. Drink enough fluid to keep your urine pale yellow. Staying well hydrated may help prevent cramps or spasms. This information is not intended to replace advice given to you by your health care provider. Make sure you discuss any questions you have with your health care provider. Document Revised: 04/09/2021 Document Reviewed: 04/09/2021 Elsevier Patient Education  [place] and Counseling Resources Most providers on this list will take Medicaid. Patients with commercial insurance or Medicare should contact their insurance company to get a list of in network providers.  [employer] (takes children) Location 1: [address] [place], [place] [address] Location 2: [place], [place] [address] [place] ([place] speaking therapist available)(habla espanol)(take medicare and medicaid)  [place], [place], [place] [address], [place] [email or web address] [phone]  [facility] Counseling [address] [place], [place] [address] [place], [place], [place] [address]      [phone]  [place] (spanish available) [place], [place] [address] [place] (take [facility] and medicare) [address] [place], [place] [address]       [phone]     [place] (virtual only) [phone]  [facility]inny Bl[facility] [address] [place], [place]  [facility]:  [place]. [place] [phone]  [facility]:  [place] [address] [phone]  [place] [facility] (under & uninsured) [address], [place] [phone]    [email or web address]    [place] [address]  [place]    [phone]  [facility] [place]     Phone:  [phone]     [place]  [place] [address]      [place], [place]: [address] [place], [place]   [place] (Spanish therapist) [email or web address]  [place] [address]   [place] [address]    [phone]    [facility]   [address],  [place], [place]   [place]  [place]  [facility]  [address] [place], [place]        [phone]  Open Access/Walk In Clinic under & uninsured  [facility]  [address] [place], [place] Crisis [phone]  [facility] the [place],  ([place])   [place], [place]: [phone]) 8:30 - 12; 1 - 2:30  Family Service of the [place],  [place], [place]     [phone]):8:30 - 12; 2 - 3PM  [facility] [employer],  [address]  [place]; [phone]):   Mon - Fri 8 AM - 5 PM  [facility] [place]  MWF 12:30 to 3:00 or call to schedule an appointment  [phone]  Specific Provider options Psychology Today  [email or web address] click on find a therapist  enter your zip code left side and select or tailor a therapist for your specific need.   [facility] Provider Directory [email or web address]  (Medicaid)   Follow all drop down to find a provider  [place] or [email or web address] [address], [place], [place] Recovery support and educational   24- Hour Availability:   [facility]  [address] [place], [place] Crisis [phone]  [facility] of the [employer] [phone]  [place]  [phone]   [place]  [phone] (after hours)  Therapeutic Alternative/Mobile Crisis   [phone]  [place] National Suicide Hotline  [phone] Diamantina Monks)  Call 911 or go to emergency room  [facility]  [phone]);  [place] and [employer]  [phone]); [place], [place], [place], [place], [place], [place], [place]

## 2022-08-01 NOTE — Assessment & Plan Note (Signed)
Previously on Sertraline which should be avoided in the absence of mood stabilizer given her bipolar disorder. She feels that her mood is stable at this time. PHQ-9 score of 10. We discussed therapy/counseling which she is open to. Resources provided in AVS. No thoughts of SI.

## 2022-08-01 NOTE — Progress Notes (Signed)
    SUBJECTIVE:   CHIEF COMPLAINT / HPI:   Bridget [name] is a 61 y.o. female who presents to the [facility] clinic today to discuss the following concerns:   Muscle Spasms Ongoing for the last 2-3 months, "every now and then". Lately in the last 3 weeks she has felt them daily and they feel more "back to back". She had a spasm that awoke her out of sleep. She has been drinking pickle juice which seems to help. She took a baby Aspirin the other day because she felt like behind her right calf was "sore". She feels like the Aspirin helped her calf but now she feels a catching sensation in her right knee.  She has done some research on what may cause spasms. She discovered they can be due to dehydration, a nerve problem or thyroid. She then notes that she has been having some spasms in her neck "for a while". The spasms tend to last 3-5 minutes. She has tried stretching in addition to pickle juice. She has also tried heating pads and ice. Most of the time they occur at night and wake her up out of sleep.   She recently saw her orthopedist for left shoulder. She states she has another appointment next month with Ortho.   PDMP reviewed, recently filled 150 tablets of Hydrocodone-Acetaminophen 5-325 mg on 10/23. Prior to that appears she had been prescribed 180 tablets of 10-325 mg monthly.  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 Symmetric leg size  Skin: warm and dry Psych: Normal affect and mood  ASSESSMENT/PLAN:   Muscle spasm of both lower legs Ongoing and  worsened for the last few weeks. Has been hydrating well per her report. Since improved with pickle juice, suspect possible electrolyte imbalance. Though patient mentioned possible DVT, I have low suspicion. Wells score 0. No risk factors, exam benign. Defer Vascular U/S for now.  -Check BMP, Mg -Check TSH per patient preference.   Chronic pain syndrome On Percocet which is managed by another clinic.   Mood disorder (HCC) Previously on Sertraline which should be avoided in the absence of mood stabilizer given her bipolar disorder. She feels that her mood is stable at this time. PHQ-9 score of 10. We discussed therapy/counseling which she is open to. Resources provided in AVS. No thoughts of SI.     Sharion Settler, [place]

## 2022-08-01 NOTE — Assessment & Plan Note (Addendum)
Ongoing and worsened for the last few weeks. Has been hydrating well per her report. Since improved with pickle juice, suspect possible electrolyte imbalance. Though patient mentioned possible DVT, I have low suspicion. Wells score 0. No risk factors, exam benign. Defer Vascular U/S for now.  -Check BMP, Mg -Check TSH per patient preference.

## 2022-08-02 ENCOUNTER — Encounter: Payer: Self-pay | Admitting: Family Medicine

## 2022-08-02 LAB — TSH RFX ON ABNORMAL TO FREE T4: TSH: 0.809 u[IU]/mL (ref 0.450–4.500)

## 2022-08-02 LAB — BASIC METABOLIC PANEL
BUN/Creatinine Ratio: 11 (ref 9–23)
BUN: 10 mg/dL (ref 6–24)
CO2: 20 mmol/L (ref 20–29)
Calcium: 9.6 mg/dL (ref 8.7–10.2)
Chloride: 105 mmol/L (ref 96–106)
Creatinine, Ser: 0.95 mg/dL (ref 0.57–1.00)
Glucose: 107 mg/dL — ABNORMAL HIGH (ref 70–99)
Potassium: 3.8 mmol/L (ref 3.5–5.2)
Sodium: 141 mmol/L (ref 134–144)
eGFR: 69 mL/min/{1.73_m2} (ref >59)

## 2022-08-02 LAB — MAGNESIUM: Magnesium: 2.2 mg/dL (ref 1.6–2.3)

## 2022-11-10 ENCOUNTER — Telehealth: Payer: Self-pay

## 2022-11-10 NOTE — Telephone Encounter (Signed)
Patient calls nurse line regarding issues with pain medication. She states that she is a patient at pain clinic, however, Dr. Toniann Fail is not enrolled in Medicaid.   Called pharmacy and verified. They state that prescription is unable to be processed through patient's insurance, due to prescriber not being enrolled in [place].   Patient states that she was told to reach out to PCP to see if she could send in prescription.   Called [facility] on [name] and verified prescription. Patient was prescribed hydrocodone 10-325 mg. Directions: 1 tablet by mouth every 4-6 hours as needed for moderate pain. Dispense quantity 180.  Will forward to PCP.   Talbot Grumbling, RN

## 2022-11-11 NOTE — Telephone Encounter (Signed)
Called patient and informed of provider message.   Talbot Grumbling, RN

## 2023-11-09 ENCOUNTER — Telehealth: Payer: Self-pay

## 2023-11-09 NOTE — Telephone Encounter (Signed)
 Patient calls nurse line requesting an urgent apt.   She reports she saw her pain management provider and reports she had a low blood pressure.   Confirmed with nurse BP today 107/78 and O2 sat 94%.  Patient has not been seen since 2023.  Patient scheduled for 2/10.

## 2023-11-13 ENCOUNTER — Ambulatory Visit (INDEPENDENT_AMBULATORY_CARE_PROVIDER_SITE_OTHER): Payer: MEDICAID | Admitting: Student

## 2023-11-13 VITALS — BP 100/75 | HR 86 | Temp 98.4°F | Wt 202.4 lb

## 2023-11-13 DIAGNOSIS — R031 Nonspecific low blood-pressure reading: Secondary | ICD-10-CM | POA: Diagnosis not present

## 2023-11-13 DIAGNOSIS — Z0000 Encounter for general adult medical examination without abnormal findings: Secondary | ICD-10-CM | POA: Diagnosis not present

## 2023-11-13 NOTE — Progress Notes (Signed)
  SUBJECTIVE:   CHIEF COMPLAINT / HPI:   Low BP Went to Pain Doctor and they told her that her BP was too low and her O2 Saturation was too low. Looking at phone note from [facility], BP was 107/78 w/ Sat 94%.   BP today is 100/75, w/ O2 sats of 95. Patient is doing well, no concerns for hypotension, or low O2 sats. No complaints of CP, light headedness, or SOB. Doesn't stand up and get dizzy or any other concerns.    PERTINENT  PMH / PSH:    OBJECTIVE:  BP 100/75   Pulse 86   Temp 98.4 F (36.9 C)   Wt 202 lb 6.4 oz (91.8 kg)   LMP  (LMP Unknown)   BMI 38.24 kg/m  Physical Exam Constitutional:      General: She is not in acute distress.    Appearance: Normal appearance. She is not ill-appearing.  Cardiovascular:     Rate and Rhythm: Normal rate and regular rhythm.     Pulses: Normal pulses.     Heart sounds: Normal heart sounds. No murmur heard.    No friction rub. No gallop.  Pulmonary:     Effort: Pulmonary effort is normal. No respiratory distress.     Breath sounds: Normal breath sounds. No stridor. No wheezing, rhonchi or rales.  Abdominal:     General: Bowel sounds are normal. There is no distension.     Palpations: Abdomen is soft. There is no mass.     Tenderness: There is no abdominal tenderness. There is no guarding or rebound.     Hernia: No hernia is present.  Skin:    Capillary Refill: Capillary refill takes less than 2 seconds.  Neurological:     Mental Status: She is alert.  Psychiatric:        Mood and Affect: Mood normal.        Behavior: Behavior normal.      ASSESSMENT/PLAN:   Assessment & Plan Blood pressure lower than prior measurement Patient comes in for follow-up of her blood pressure.  Patient reports she was at her pain clinic, where they said her blood pressure was too low, and that her oxygen saturation was too low.  Note given to CMA, Lincoln Renshaw, showed that her BP was 107/78, and saturation 94%.  neither of these values are too  low, or concerning.  BP today similar at 100/75, with O2 sat of 95.  Patient denies any chest pain, shortness of breath, dizziness, presyncope symptoms.  Provided reassurance to patient, also provided log of previous blood pressures since 2021. -Provided reassurance Healthcare maintenance Patient requesting blood work today to check kidney function, electrolytes, cholesterol.  Will check BMP and lipid panel - BMP - Lipid panel No follow-ups on file. Wilhemena Harbour, MD 11/13/2023, 11:03 AM PGY-3, [facility]

## 2023-11-13 NOTE — Assessment & Plan Note (Addendum)
 Patient requesting blood work today to check kidney function, electrolytes, cholesterol.  Will check BMP and lipid panel - BMP - Lipid panel

## 2023-11-13 NOTE — Assessment & Plan Note (Addendum)
 Patient comes in for follow-up of her blood pressure.  Patient reports she was at her pain clinic, where they said her blood pressure was too low, and that her oxygen saturation was too low.  Note given to CMA, Lincoln Renshaw, showed that her BP was 107/78, and saturation 94%.  neither of these values are too low, or concerning.  BP today similar at 100/75, with O2 sat of 95.  Patient denies any chest pain, shortness of breath, dizziness, presyncope symptoms.  Provided reassurance to patient, also provided log of previous blood pressures since 2021. -Provided reassurance

## 2023-11-13 NOTE — Patient Instructions (Signed)
 It was great to see you! Thank you for allowing me to participate in your care!  I recommend that you always bring your medications to each appointment as this makes it easy to ensure we are on the correct medications and helps us  not miss when refills are needed.  Our plans for today:  - Low Blood Pressure   Your blood pressure is fine and is not too low. And your O2 saturation is also fine.  I do not see anything concerning about your blood pressure or previous blood pressures.    *If you find that your blood pressure is too low/a problem, please call clinic back and leave a message for me.   We are checking some labs today, I will call you if they are abnormal will send you a MyChart message or a letter if they are normal.  If you do not hear about your labs in the next 2 weeks please let us  know.  Take care and seek immediate care sooner if you develop any concerns.   Dr. Wilhemena Harbour, MD [facility]

## 2023-11-14 LAB — BASIC METABOLIC PANEL
BUN/Creatinine Ratio: 11 — ABNORMAL LOW (ref 12–28)
BUN: 9 mg/dL (ref 8–27)
CO2: 20 mmol/L (ref 20–29)
Calcium: 9.1 mg/dL (ref 8.7–10.3)
Chloride: 103 mmol/L (ref 96–106)
Creatinine, Ser: 0.81 mg/dL (ref 0.57–1.00)
Glucose: 114 mg/dL — ABNORMAL HIGH (ref 70–99)
Potassium: 4.2 mmol/L (ref 3.5–5.2)
Sodium: 138 mmol/L (ref 134–144)
eGFR: 83 mL/min/{1.73_m2} (ref >59)

## 2023-11-14 LAB — LIPID PANEL
Chol/HDL Ratio: 5.7 {ratio} — ABNORMAL HIGH (ref 0.0–4.4)
Cholesterol, Total: 193 mg/dL (ref 100–199)
HDL: 34 mg/dL — ABNORMAL LOW (ref >39)
LDL Chol Calc (NIH): 131 mg/dL — ABNORMAL HIGH (ref 0–99)
Triglycerides: 155 mg/dL — ABNORMAL HIGH (ref 0–149)
VLDL Cholesterol Cal: 28 mg/dL (ref 5–40)

## 2023-11-16 ENCOUNTER — Other Ambulatory Visit: Payer: Self-pay | Admitting: Student

## 2023-11-16 ENCOUNTER — Encounter: Payer: Self-pay | Admitting: Student

## 2023-11-16 MED ORDER — ROSUVASTATIN CALCIUM 10 MG PO TABS: 10.0000 mg | ORAL_TABLET | Freq: Every day | ORAL | 11 refills | Status: DC

## 2023-11-16 MED ORDER — ROSUVASTATIN CALCIUM 10 MG PO TABS: 10.0000 mg | ORAL_TABLET | Freq: Every day | ORAL | 11 refills | Status: AC

## 2023-11-16 NOTE — Progress Notes (Signed)
Pt ASCVD risk calls for moderate intensity statin. Will start Crestor 10 mg

## 2023-11-16 NOTE — Addendum Note (Signed)
Addended by: Bess Kinds T on: 11/16/2023 08:13 AM   Modules accepted: Orders

## 2024-01-31 ENCOUNTER — Encounter: Payer: Self-pay | Admitting: Family Medicine

## 2024-01-31 ENCOUNTER — Ambulatory Visit: Payer: MEDICAID | Admitting: Student

## 2024-01-31 VITALS — BP 118/76 | HR 87 | Ht 61.0 in | Wt 194.6 lb

## 2024-01-31 DIAGNOSIS — M79605 Pain in left leg: Secondary | ICD-10-CM

## 2024-01-31 NOTE — Progress Notes (Signed)
    SUBJECTIVE:   CHIEF COMPLAINT / HPI:   The patient, with osteoarthritis, presents with an episode of severe left knee pain last night described as shooting, burning, and achy, with episodes of intense pain radiating from the back of the knee to the ankle and foot. It woke her at 2:30 AM, lasting 30 minutes.  She tried applying pressure and walking on it and it eventually resolved.  She now is only experiencing her baseline OA pain.  A steroid injection was administered in the left knee on January 24, 2024 by ortho for her OA.  She denies any fever, redness, swelling of the knee.  There is a family history of gout which she wants to be checked for as a family member told her that the pain she had last night could be gout.   PERTINENT  PMH / PSH: OA of knees  OBJECTIVE:   BP 118/76   Pulse 87   Ht 5\' 1"  (1.549 m)   Wt 194 lb 9.6 oz (88.3 kg)   LMP  (LMP Unknown)   SpO2 99%   BMI 36.77 kg/m    General: NAD, pleasant, able to participate in exam Cardiac: RRR, no murmurs. Respiratory: CTAB, normal effort, No wheezes, rales or rhonchi Abdomen: Bowel sounds present, nontender, nondistended, no hepatosplenomegaly. Extremities: no erythema, edema, warmth of left knee.  Mild TTP to lateral joint line.  5/5 muscle strength with flexion and extension, good ROM with flexion and extension with minimal pain. Good knee stability.  No erythema, edema, warmth or TTP of left calf muscle Skin: warm and dry, no rashes noted Neuro: alert, no obvious focal deficits Psych: Normal affect and mood  ASSESSMENT/PLAN:   Left leg pain Acute episode of intense pain in left knee radiating down to left ankle waking her from sleep last night which lasted approximately 30 minutes and resolved.   Chronic osteoarthritis of the left knee with recent exacerbation treated with steroid injection by orthopedist.  Low concern for infection and gout given no swelling, erythema, warmth or significant pain with  palpation. Differential includes osteoarthritis flare, nerve pain, or muscle cramp. Reassuringly pain subsided since acute episode.  - Continue current pain medications and ibuprofen  as prescribed.  - Apply Voltaren  gel topically four times daily, especially before activities that trigger pain.  - Monitor for recurrence of severe pain. If frequent, return for evaluation.   - Can consider referral to physical medicine and rehabilitation if desired for chronic pain although she already sees a pain clinic     Dr. Glenn Lange, DO [place] [facility]

## 2024-01-31 NOTE — Patient Instructions (Signed)
 It was great to see you! Thank you for allowing me to participate in your care!    Our plans for today:  - Continue current pain regimen - I am very reassured that the initialy pain has improved and you are back to baseline - I do not seem any exam findings that are concerning for infection or gout which is good - It is possible that the intense pain could have been related to a muscle cramp   Take care and seek immediate care sooner if you develop any concerns.   Dr. Glenn Lange, DO [facility]

## 2024-01-31 NOTE — Assessment & Plan Note (Signed)
 Acute episode of intense pain in left knee radiating down to left ankle waking her from sleep last night which lasted approximately 30 minutes and resolved.   Chronic osteoarthritis of the left knee with recent exacerbation treated with steroid injection by orthopedist.  Low concern for infection and gout given no swelling, erythema, warmth or significant pain with palpation. Differential includes osteoarthritis flare, nerve pain, or muscle cramp. Reassuringly pain subsided since acute episode.  - Continue current pain medications and ibuprofen  as prescribed.  - Apply Voltaren  gel topically four times daily, especially before activities that trigger pain.  - Monitor for recurrence of severe pain. If frequent, return for evaluation.   - Can consider referral to physical medicine and rehabilitation if desired for chronic pain although she already sees a pain clinic

## 2024-02-29 ENCOUNTER — Other Ambulatory Visit: Payer: Self-pay | Admitting: Internal Medicine

## 2024-02-29 DIAGNOSIS — M5459 Other low back pain: Secondary | ICD-10-CM

## 2024-02-29 DIAGNOSIS — M5432 Sciatica, left side: Secondary | ICD-10-CM

## 2024-02-29 DIAGNOSIS — G629 Polyneuropathy, unspecified: Secondary | ICD-10-CM

## 2024-02-29 DIAGNOSIS — R531 Weakness: Secondary | ICD-10-CM

## 2024-03-06 ENCOUNTER — Encounter: Payer: Self-pay | Admitting: Internal Medicine

## 2024-03-08 ENCOUNTER — Other Ambulatory Visit: Payer: MEDICAID

## 2024-04-01 ENCOUNTER — Encounter: Payer: Self-pay | Admitting: Internal Medicine

## 2024-06-18 ENCOUNTER — Ambulatory Visit: Payer: Self-pay | Admitting: Family Medicine

## 2024-06-18 ENCOUNTER — Ambulatory Visit (INDEPENDENT_AMBULATORY_CARE_PROVIDER_SITE_OTHER): Payer: MEDICAID | Admitting: Family Medicine

## 2024-06-18 ENCOUNTER — Encounter: Payer: Self-pay | Admitting: Family Medicine

## 2024-06-18 VITALS — BP 134/88 | HR 86 | Ht 61.0 in | Wt 186.0 lb

## 2024-06-18 DIAGNOSIS — M25551 Pain in right hip: Secondary | ICD-10-CM

## 2024-06-18 DIAGNOSIS — Z1231 Encounter for screening mammogram for malignant neoplasm of breast: Secondary | ICD-10-CM | POA: Diagnosis not present

## 2024-06-18 DIAGNOSIS — R059 Cough, unspecified: Secondary | ICD-10-CM

## 2024-06-18 DIAGNOSIS — N3945 Continuous leakage: Secondary | ICD-10-CM | POA: Diagnosis not present

## 2024-06-18 LAB — POC SOFIA 2 FLU + SARS ANTIGEN FIA
Influenza A, POC: NEGATIVE
Influenza B, POC: NEGATIVE
SARS Coronavirus 2 Ag: NEGATIVE

## 2024-06-18 NOTE — Patient Instructions (Addendum)
 Good to see you today - Thank you for coming in  Things we discussed today: Please follow up with Dr. Josefina for your hip pain  Try to stop smoking cigarettes   Call to schedule your mammogram!

## 2024-06-18 NOTE — Progress Notes (Signed)
    SUBJECTIVE:   CHIEF COMPLAINT / HPI:   Right lower back pain x 1 week, describes pain as pulling, occasionally radiates down her right leg.  No recent injury.  History of osteoarthritis.  Follows with pain clinic, takes hydrocodone  regularly along with Robaxin  and ibuprofen .  Her usual pain medicine provides some relief. Sees Dr. Josefina, has had bilateral hip replacements No new urinary incontinence, no saddle anesthesia  Reports chronic urinary incontinence.  She has continuous leakage of urine, exacerbated by sneezing or coughing.  Not interested in medical therapy or other interventions.  She manages her incontinence with diapers and pads and they work well for her.  PERTINENT  PMH / PSH: OA R hip, OA spine, OA L hip, fibromyalgia, chronic pain syndrome  OBJECTIVE:   BP 134/88   Pulse 86   Ht 5' 1 (1.549 m)   Wt 186 lb (84.4 kg)   LMP  (LMP Unknown)   SpO2 97%   BMI 35.14 kg/m   General: well appearing, NAD Respiratory: normal work of breathing on RA Abdomen: Soft, non-tender, non-distended Extremities: Ambulates independently with cart.  No limp.  Negative straight leg raise bilaterally.  No midline spine tenderness, pain to right lumbar paraspinal region.  Pain with internal rotation of right hip.  No pain with external rotation of right hip.  No swelling BLE.   ASSESSMENT/PLAN:   Assessment & Plan Acute right hip pain Suspect her pain is originating from her hip and not her back.  She is already on a good pain medication regimen.  I advised her to follow-up with her orthopedic surgeon who did her hip replacement Cough, unspecified type Has had some cough and congestion worsened after smoking recently Several neighbors tested positive for COVID Would like COVID testing today Advised smoking cessation Continuous leakage of urine Chronic, stable issue DME order for diapers and pads placed.  Not interested in medication therapy or rehab at this time, consider in the  future. Encounter for screening mammogram for malignant neoplasm of breast Due for mammogram, order placed     [name], DO [facility]

## 2024-06-21 ENCOUNTER — Telehealth: Payer: Self-pay

## 2024-06-21 NOTE — Telephone Encounter (Signed)
 Community message sent to [employer] for Incontinence Supplies.

## 2024-07-02 NOTE — Telephone Encounter (Signed)
 Received message from [employer]. They received the request for incontinence supplies but needs diaper size as well as demographics with insurance information.  Please fax to [phone].  Margit

## 2024-07-02 NOTE — Telephone Encounter (Signed)
 Received fax from [employer] requesting demographics, ICD codes, pull up sizing and office notes.   Called patient and discussed sizing. This has been completed on form.   Dr. McDiarmid signed order form. Placed forms in medical records for last OV note to be printed and faxed back to [employer].   Chiquita JAYSON English, RN

## 2024-07-04 ENCOUNTER — Ambulatory Visit
Admission: RE | Admit: 2024-07-04 | Discharge: 2024-07-04 | Disposition: A | Discharge status: Home / Self Care | Payer: MEDICAID | Source: Ambulatory Visit | Attending: Family Medicine | Admitting: Family Medicine

## 2024-07-04 DIAGNOSIS — Z1231 Encounter for screening mammogram for malignant neoplasm of breast: Secondary | ICD-10-CM
